# Patient Record
Sex: Female | Born: 1950 | Race: White | Hispanic: No | Smoking: Former smoker
Health system: Southern US, Community
[De-identification: ages and names within clinical notes are randomized; demographics above are authoritative.]

## PROBLEM LIST (undated history)

## (undated) DIAGNOSIS — M1990 Unspecified osteoarthritis, unspecified site: Secondary | ICD-10-CM

## (undated) DIAGNOSIS — I10 Essential (primary) hypertension: Secondary | ICD-10-CM

## (undated) DIAGNOSIS — Z889 Allergy status to unspecified drugs, medicaments and biological substances status: Secondary | ICD-10-CM

## (undated) DIAGNOSIS — F419 Anxiety disorder, unspecified: Secondary | ICD-10-CM

## (undated) DIAGNOSIS — E039 Hypothyroidism, unspecified: Secondary | ICD-10-CM

## (undated) DIAGNOSIS — G4700 Insomnia, unspecified: Secondary | ICD-10-CM

## (undated) DIAGNOSIS — I499 Cardiac arrhythmia, unspecified: Secondary | ICD-10-CM

## (undated) DIAGNOSIS — C801 Malignant (primary) neoplasm, unspecified: Secondary | ICD-10-CM

## (undated) DIAGNOSIS — E041 Nontoxic single thyroid nodule: Secondary | ICD-10-CM

## (undated) DIAGNOSIS — R002 Palpitations: Secondary | ICD-10-CM

## (undated) DIAGNOSIS — M8580 Other specified disorders of bone density and structure, unspecified site: Secondary | ICD-10-CM

## (undated) DIAGNOSIS — E785 Hyperlipidemia, unspecified: Secondary | ICD-10-CM

## (undated) DIAGNOSIS — J189 Pneumonia, unspecified organism: Secondary | ICD-10-CM

## (undated) DIAGNOSIS — F988 Other specified behavioral and emotional disorders with onset usually occurring in childhood and adolescence: Secondary | ICD-10-CM

## (undated) HISTORY — PX: TONSILLECTOMY: SUR1361

## (undated) HISTORY — PX: AUGMENTATION MAMMAPLASTY: SUR837

## (undated) HISTORY — DX: Hyperlipidemia, unspecified: E78.5

## (undated) HISTORY — DX: Unspecified osteoarthritis, unspecified site: M19.90

## (undated) HISTORY — PX: TUBAL LIGATION: SHX77

## (undated) HISTORY — PX: HIP SURGERY: SHX245

## (undated) HISTORY — PX: JOINT REPLACEMENT: SHX530

## (undated) HISTORY — PX: COLONOSCOPY: SHX174

## (undated) HISTORY — PX: ABDOMINAL HYSTERECTOMY: SHX81

---

## 1998-01-21 ENCOUNTER — Other Ambulatory Visit: Admission: RE | Admit: 1998-01-21 | Discharge: 1998-01-21 | Payer: Self-pay | Admitting: Obstetrics & Gynecology

## 1999-03-30 ENCOUNTER — Other Ambulatory Visit: Admission: RE | Admit: 1999-03-30 | Discharge: 1999-03-30 | Payer: Self-pay | Admitting: Obstetrics & Gynecology

## 2001-08-30 ENCOUNTER — Other Ambulatory Visit: Admission: RE | Admit: 2001-08-30 | Discharge: 2001-08-30 | Payer: Self-pay | Admitting: Obstetrics & Gynecology

## 2002-12-17 ENCOUNTER — Other Ambulatory Visit: Admission: RE | Admit: 2002-12-17 | Discharge: 2002-12-17 | Payer: Self-pay | Admitting: Obstetrics & Gynecology

## 2002-12-19 ENCOUNTER — Encounter: Admission: RE | Admit: 2002-12-19 | Discharge: 2002-12-19 | Payer: Self-pay | Admitting: Obstetrics & Gynecology

## 2002-12-19 ENCOUNTER — Encounter: Payer: Self-pay | Admitting: Obstetrics & Gynecology

## 2002-12-19 ENCOUNTER — Ambulatory Visit (HOSPITAL_COMMUNITY): Admission: RE | Admit: 2002-12-19 | Discharge: 2002-12-19 | Payer: Self-pay | Admitting: Obstetrics & Gynecology

## 2003-05-15 ENCOUNTER — Encounter: Admission: RE | Admit: 2003-05-15 | Discharge: 2003-05-15 | Payer: Self-pay | Admitting: Obstetrics & Gynecology

## 2003-05-15 ENCOUNTER — Encounter: Payer: Self-pay | Admitting: Obstetrics & Gynecology

## 2003-08-12 ENCOUNTER — Encounter: Payer: Self-pay | Admitting: Obstetrics & Gynecology

## 2003-08-12 ENCOUNTER — Encounter: Admission: RE | Admit: 2003-08-12 | Discharge: 2003-08-12 | Payer: Self-pay | Admitting: Obstetrics & Gynecology

## 2004-03-31 ENCOUNTER — Other Ambulatory Visit: Admission: RE | Admit: 2004-03-31 | Discharge: 2004-03-31 | Payer: Self-pay | Admitting: Obstetrics & Gynecology

## 2004-07-08 ENCOUNTER — Ambulatory Visit (HOSPITAL_COMMUNITY): Admission: RE | Admit: 2004-07-08 | Discharge: 2004-07-08 | Payer: Self-pay | Admitting: Internal Medicine

## 2004-07-19 ENCOUNTER — Ambulatory Visit (HOSPITAL_COMMUNITY): Admission: RE | Admit: 2004-07-19 | Discharge: 2004-07-19 | Payer: Self-pay | Admitting: Neurosurgery

## 2005-02-22 ENCOUNTER — Other Ambulatory Visit: Admission: RE | Admit: 2005-02-22 | Discharge: 2005-02-22 | Payer: Self-pay | Admitting: Obstetrics & Gynecology

## 2005-02-27 ENCOUNTER — Encounter: Admission: RE | Admit: 2005-02-27 | Discharge: 2005-02-27 | Payer: Self-pay | Admitting: Obstetrics & Gynecology

## 2005-06-26 ENCOUNTER — Ambulatory Visit (HOSPITAL_COMMUNITY): Admission: RE | Admit: 2005-06-26 | Discharge: 2005-06-26 | Payer: Self-pay | Admitting: Neurosurgery

## 2005-08-21 ENCOUNTER — Other Ambulatory Visit: Admission: RE | Admit: 2005-08-21 | Discharge: 2005-08-21 | Payer: Self-pay | Admitting: Obstetrics & Gynecology

## 2005-12-19 ENCOUNTER — Encounter (INDEPENDENT_AMBULATORY_CARE_PROVIDER_SITE_OTHER): Payer: Self-pay | Admitting: *Deleted

## 2005-12-19 ENCOUNTER — Observation Stay (HOSPITAL_COMMUNITY): Admission: RE | Admit: 2005-12-19 | Discharge: 2005-12-19 | Payer: Self-pay | Admitting: Obstetrics & Gynecology

## 2006-04-04 ENCOUNTER — Encounter: Admission: RE | Admit: 2006-04-04 | Discharge: 2006-04-04 | Payer: Self-pay | Admitting: Obstetrics & Gynecology

## 2006-05-07 ENCOUNTER — Encounter: Admission: RE | Admit: 2006-05-07 | Discharge: 2006-05-07 | Payer: Self-pay | Admitting: Obstetrics & Gynecology

## 2007-06-20 ENCOUNTER — Encounter: Admission: RE | Admit: 2007-06-20 | Discharge: 2007-06-20 | Payer: Self-pay | Admitting: Obstetrics & Gynecology

## 2007-10-01 ENCOUNTER — Encounter: Admission: RE | Admit: 2007-10-01 | Discharge: 2007-10-01 | Payer: Self-pay | Admitting: Internal Medicine

## 2007-10-08 ENCOUNTER — Encounter: Admission: RE | Admit: 2007-10-08 | Discharge: 2007-10-08 | Payer: Self-pay | Admitting: Internal Medicine

## 2007-10-08 ENCOUNTER — Encounter (INDEPENDENT_AMBULATORY_CARE_PROVIDER_SITE_OTHER): Payer: Self-pay | Admitting: Interventional Radiology

## 2007-10-08 ENCOUNTER — Other Ambulatory Visit: Admission: RE | Admit: 2007-10-08 | Discharge: 2007-10-08 | Payer: Self-pay | Admitting: Interventional Radiology

## 2009-02-25 ENCOUNTER — Ambulatory Visit: Payer: Self-pay | Admitting: Internal Medicine

## 2009-03-29 ENCOUNTER — Ambulatory Visit: Payer: Self-pay | Admitting: Internal Medicine

## 2009-04-29 ENCOUNTER — Ambulatory Visit: Payer: Self-pay | Admitting: Internal Medicine

## 2009-05-27 ENCOUNTER — Ambulatory Visit: Payer: Self-pay | Admitting: Internal Medicine

## 2009-06-22 ENCOUNTER — Encounter: Admission: RE | Admit: 2009-06-22 | Discharge: 2009-06-22 | Payer: Self-pay | Admitting: Obstetrics & Gynecology

## 2009-06-22 ENCOUNTER — Ambulatory Visit: Payer: Self-pay | Admitting: Internal Medicine

## 2009-09-24 ENCOUNTER — Ambulatory Visit: Payer: Self-pay | Admitting: Internal Medicine

## 2010-11-03 ENCOUNTER — Ambulatory Visit: Payer: Self-pay | Admitting: Internal Medicine

## 2010-12-18 ENCOUNTER — Encounter: Payer: Self-pay | Admitting: Obstetrics & Gynecology

## 2010-12-18 ENCOUNTER — Encounter: Payer: Self-pay | Admitting: Neurosurgery

## 2011-03-29 ENCOUNTER — Other Ambulatory Visit: Payer: Self-pay | Admitting: Obstetrics & Gynecology

## 2011-03-29 DIAGNOSIS — Z1231 Encounter for screening mammogram for malignant neoplasm of breast: Secondary | ICD-10-CM

## 2011-03-31 ENCOUNTER — Ambulatory Visit
Admission: RE | Admit: 2011-03-31 | Discharge: 2011-03-31 | Disposition: A | Discharge status: Home / Self Care | Payer: BC Managed Care – PPO | Source: Ambulatory Visit | Attending: Obstetrics & Gynecology | Admitting: Obstetrics & Gynecology

## 2011-03-31 DIAGNOSIS — Z1231 Encounter for screening mammogram for malignant neoplasm of breast: Secondary | ICD-10-CM

## 2011-04-14 NOTE — H&P (Signed)
Jeanne Smith, Jeanne Smith               ACCOUNT NO.:  [id number]   MEDICAL RECORD NO.:  [id number]          PATIENT TYPE:  AMB   LOCATION:  SDC                           FACILITY:  [facility]   PHYSICIAN:  Freddy Finner, M.D.   DATE OF BIRTH:  Apr 04, 1951   DATE OF ADMISSION:  12/19/2005  DATE OF DISCHARGE:                                HISTORY & PHYSICAL   ADMISSION DIAGNOSES:  1.  Uterine fibroid.  2.  Endometrial polyp.  3.  Postmenopausal bleeding.   HISTORY OF PRESENT ILLNESS:  The patient is a 60 year old white married  female, gravida 4, para 3, who has had a long history of irregular bleeding  and perimenopausal bleeding since at least March of 2003.  She has been  known to have a urine fibroid.  She is currently on hormone replacement  therapy and recently had an episode of postmenopausal bleeding and on  sonohysterogram here in the office was found to have a large endometrial  polyp.  Fibroid was further confirmed.  She has bilateral cystic adnexal  masses.  On several occasions in the past, she has been scheduled for  surgery, but rescheduled.  She is now prepared to proceed with definitive  surgery, specifically laparoscopically-assisted vaginal hysterectomy,  bilateral salpingo-oophorectomy.   REVIEW OF SYSTEMS:  Otherwise negative.  She has no other known significant  medical illnesses.   MEDICATIONS:  1.  Calcium supplement.  2.  Vitamins.  3.  Ibuprofen on a p.r.n. basis.  4.  Femhrt which she takes daily.  5.  Xanax which she takes on p.r.n. basis.   She is on no other medications and has no other known medical illnesses.   PAST SURGICAL HISTORY:  D&C for lost IUD with pregnancy and laparoscopic  sterilization done in 1985.  She had a right breast biopsy in 1995 which was  benign.  She has had no other surgical procedures.  She has never had a  blood transfusion.  She has had a breast augmentation.   She only occasionally uses cigarettes, occasionally uses  alcohol.   FAMILY HISTORY:  Noncontributory.   PHYSICAL EXAMINATION:  HEENT:  Grossly within normal limits.  Thyroid gland  is not palpably enlarged.  VITAL SIGNS:  Blood pressure in the office on recent check was 140/88.  CHEST:  Clear to auscultation.  HEART:  Normal sinus rhythm without murmurs, rubs, or gallops.  BREASTS:  Considered to be normal, no palpable masses, no skin change, no  nipple discharge.  ABDOMEN:  Soft and nontender without appreciable organomegaly or palpable  masses.  EXTREMITIES:  Without cyanosis, clubbing, or edema.  PELVIC:  External genitalia, vagina, and cervix are normal to inspection.  Bimanual reveals the uterus to be retroverted and slightly increased in  overall size.  There are no palpable masses on clinical examination, but  pelvic ultrasound has confirmed otherwise.  RECTAL:  The rectum is palpably normal.  Rectovaginal examination confirms  the above findings.   ASSESSMENT:  1.  Uterine fibroid.  2.  Large endometrial polyp.  3.  Bilateral cystic adnexal masses.  PLAN:  Laparoscopically-assisted vaginal hysterectomy, bilateral salpingo-  oophorectomy.      Freddy Finner, M.D.  Electronically Signed     WRN/MEDQ  D:  12/18/2005  T:  12/18/2005  Job:  [id number]

## 2011-04-14 NOTE — Op Note (Signed)
Jeanne Smith, Jeanne Smith               ACCOUNT NO.:  [id number]   MEDICAL RECORD NO.:  [id number]          PATIENT TYPE:  OBV   LOCATION:  9313                          FACILITY:  [facility]   PHYSICIAN:  Freddy Finner, M.D.   DATE OF BIRTH:  [birth year]   DATE OF PROCEDURE:  12/19/2005  DATE OF DISCHARGE:                                 OPERATIVE REPORT   PREOPERATIVE DIAGNOSES:  1.  Uterine fibroid.  2.  Endometrial polyp.  3.  Bilateral cystic adnexal masses.   POSTOPERATIVE DIAGNOSES:  1.  No evidence of right cystic mass.  2.  Left paravertebral cyst.  3.  Uterine fibroids.  4.  Endometrial polyp.  5.  Endocervical polyp.   OPERATIVE PROCEDURE:  Laparoscopic assisted vaginal hysterectomy and  bilateral salpingo-oophorectomy.   SURGEON:  Dr. Jennette Kettle.   ASSISTANT:  Dr. Renaldo Fiddler.   ESTIMATED INTRAOPERATIVE BLOOD LOSS:  Less than 100 cc.   COMPLICATIONS:  None.   HISTORY OF PRESENT ILLNESS:  The patient is a 60 year old who is now  admitted for LAVH.  On the morning of surgery, she was given an antibiotic  preoperatively.  She was placed in antiembolic hose.  She was brought to the  operating room and placed under adequate general anesthesia.  Placed in the  dorsal lithotomy position using Allen stirrup system.  Betadine prep of the  abdomen, peroneum and vagina was carried out in the usual fashion with  Betadine scrub followed by Betadine solution.  Bladder was evacuated with  the [facility] catheter.  Tenaculum was attached to the cervix under direct  visualization.  Sterile drapes were applied.  Two small incisions were made  in the abdomen, one at the umbilicus, one just above the symphysis.  An 11  mm, nonbloody disposable Trocar was introduced at the umbilicus for  elevating the anterior abdominal wall manually.  Direct inspection revealed  adequate placement with no evidence of injury on entry.  Pneumoperitoneum  was allowed to accumulate with carbon dioxide gas.  A second 5 mm  Trocar was  placed at the lower incision under direct visualization.  Scanning  inspection of the upper abdomen revealed select blunting of the liver edge  with no gross abnormality.  There were some omental adhesions in the right  lower quadrant.  The appendix was visualized and it was normal.  The omental  adhesions were not treated.  Pelvic findings were reviewed with an  enlargement.  No apparent ovarian cyst was noted on the right, which had  been seen previously by ultrasound.  There was a paratubal cyst on the left  side.  There were no other pelvic abnormalities noted.  Using the Gyrus  tripolar device and __________ grasping forceps to the lower incision, the  infundibular and pelvic ligaments, round ligaments and upper broad ligaments  on each side were progressively developed.  Sealed with bipolar coagulation  sharply divided.  This was carried down to the level just above the uterine  arteries.  Attention was then turned vaginally.  Posterior weighted vaginal  retractor was placed.  Deaver's were used to retract  the vaginal anteriorly  and laterally.  __________  tenaculum was removed and Jacobs tenaculum  applied.  Posterior colpotomy incision was made by tending to the mucosa  with an Allis and incising sharply with Mayo scissors.  Cervix was  circumscribed.  It was scalped to release the mucosa.  The Gyrus Haney-style  clamp was then used to seal and divide the uterosacral ligaments and to seal  and divide the bladder covers.  Bladder was further advanced off the cervix  and anterior peritoneum entered.  Carbon malignant pedicles were then tucked  and sealed in with the Gyrus devise, as were the vessel pedicles.  Additional vessels on each side was sealed and divided.  The uterus was then  delivered through the vaginal introitus.  Filmy peritoneal adhesions were  then sealed and divided which completely released the uterus.  Angles of the  vagina are anchored to the uterus  with cycles of mattress sutures of  Monocryl.  Posterior peritoneum was closed and uterosacral was blockaded  with __________ 0-Monocryl.  Cuff was closed vertically with figure-of-8 0-  Monocryl.  Foley catheter was placed.  Reinspection laparoscopically  revealed approximately three small oozing sources:  One on the right  uterosacral, one on the bladder flap and one just medial to the left  uterosacral, all of which were easily controlled with bipolar coagulation.  The new shot irrigation system was used during this portion of the  procedure, and hemostasis was complete under irrigation and under reduced  intraabdominal pressure.  Irrigating solution was aspirated from the  abdomen.  Incisions were anesthetized with 0.5% plain Marcaine.  Incisions  were closed with interrupted subcuticular sutures of 3-0 Dexon.  Steri-  Strips were applied to the lower incision.  The patient was awakened, taken  to the recovery room in good condition.      Freddy Finner, M.D.  Electronically Signed     WRN/MEDQ  D:  12/19/2005  T:  12/19/2005  Job:  [id number]

## 2011-04-14 NOTE — Discharge Summary (Signed)
Jeanne Smith, Jeanne Smith               ACCOUNT NO.:  [id number]   MEDICAL RECORD NO.:  [id number]          PATIENT TYPE:  OBV   LOCATION:  9313                          FACILITY:  [facility]   PHYSICIAN:  Freddy Finner, M.D.   DATE OF BIRTH:  08/01/1951   DATE OF ADMISSION:  12/19/2005  DATE OF DISCHARGE:  12/19/2005                                 DISCHARGE SUMMARY   DISCHARGE DIAGNOSES:  1.  Uterine leiomyoma.  2.  Benign endometrial polyp.  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   OPERATIVE PROCEDURE:  Laparoscopically-assisted vaginal hysterectomy,  bilateral salpingo-oophorectomy.   INTRAOPERATIVE AND POSTOPERATIVE COMPLICATIONS:  None   DISPOSITION:  The patient was in satisfactory postoperative condition.  Approximately 12 hours after surgery, she was ambulating without difficulty.  She was having adequate bowel and bladder function. She was tolerating a  regular diet.  At her request, she was discharged home for follow-up in the  office in approximately 2 weeks. She was cautioned about fever, heavy  vaginal bleeding.  She was encouraged to do no heavy lifting or have vaginal  entry.   DISCHARGE MEDICATIONS:  1.  She was given Premarin 0.625 milligrams to be taken one a day.  2.  She was given Percocet to be taken 1-2 q.4h. as needed for postoperative      pain.  3.  She is to use ibuprofen as needed.  4.  She is to take a vitamin supplement.   Details of the present illness, past history, family history, review of  systems and physical exam recorded on admission note.   PHYSICAL EXAMINATION:  Physical findings were remarkable for the enlargement  of the uterus with uterine myoma.   Preoperative outpatient ultrasound findings included endometrial polyp  diagnosed the time of episode of postmenopausal bleeding. Also, she had, at  that time, bilateral cystic adnexal masses.   LABORATORY DATA:  Laboratory data during this admission includes a  preoperative hemoglobin of 60,  postoperative hemoglobin of 14.1.  Preoperative prothrombin time and PTT were normal. Urinalysis on admission  was normal.   HOSPITAL COURSE:  The patient was admitted on the morning of surgery.  She  was treated perioperatively with PAS anti-embolic hose and with IV  antibiotic.  The above-described operative procedure was accomplished  without any intraoperative complications. The patient was very motivated for  early discharge.  I had her catheter removed in  mid afternoon, and by the time of her discharge was voiding with minimal  residual as determined by ultrasound. She had remained afebrile. Her vital  signs were stable. She was alert, oriented. She was discharged home with  disposition as noted above.      Freddy Finner, M.D.  Electronically Signed     WRN/MEDQ  D:  01/02/2006  T:  01/02/2006  Job:  [id number]

## 2011-07-24 ENCOUNTER — Other Ambulatory Visit: Payer: Self-pay | Admitting: Internal Medicine

## 2011-07-24 NOTE — Telephone Encounter (Signed)
Needs ov. Give # 30 and book visit

## 2012-06-03 ENCOUNTER — Other Ambulatory Visit: Payer: Self-pay | Admitting: Obstetrics & Gynecology

## 2012-06-03 DIAGNOSIS — Z1231 Encounter for screening mammogram for malignant neoplasm of breast: Secondary | ICD-10-CM

## 2012-06-18 ENCOUNTER — Ambulatory Visit
Admission: RE | Admit: 2012-06-18 | Discharge: 2012-06-18 | Disposition: A | Discharge status: Home / Self Care | Payer: BC Managed Care – PPO | Source: Ambulatory Visit | Attending: Obstetrics & Gynecology | Admitting: Obstetrics & Gynecology

## 2012-06-18 DIAGNOSIS — Z1231 Encounter for screening mammogram for malignant neoplasm of breast: Secondary | ICD-10-CM

## 2014-03-13 ENCOUNTER — Other Ambulatory Visit: Payer: Self-pay

## 2014-03-13 DIAGNOSIS — Z1231 Encounter for screening mammogram for malignant neoplasm of breast: Secondary | ICD-10-CM

## 2014-03-18 ENCOUNTER — Ambulatory Visit
Admission: RE | Admit: 2014-03-18 | Discharge: 2014-03-18 | Disposition: A | Discharge status: Home / Self Care | Payer: BC Managed Care – PPO | Source: Ambulatory Visit

## 2014-03-18 DIAGNOSIS — Z1231 Encounter for screening mammogram for malignant neoplasm of breast: Secondary | ICD-10-CM

## 2014-04-07 ENCOUNTER — Encounter (HOSPITAL_BASED_OUTPATIENT_CLINIC_OR_DEPARTMENT_OTHER): Admission: RE | Payer: Self-pay | Source: Ambulatory Visit

## 2014-04-07 SURGERY — CARPAL TUNNEL RELEASE
Anesthesia: General | Laterality: Right

## 2014-04-10 ENCOUNTER — Ambulatory Visit (HOSPITAL_BASED_OUTPATIENT_CLINIC_OR_DEPARTMENT_OTHER)
Admission: RE | Admit: 2014-04-10 | Payer: BC Managed Care – PPO | Source: Ambulatory Visit | Admitting: Orthopedic Surgery

## 2015-06-14 ENCOUNTER — Encounter (HOSPITAL_COMMUNITY): Payer: Self-pay

## 2015-06-14 ENCOUNTER — Inpatient Hospital Stay (HOSPITAL_COMMUNITY)
Admission: EM | Admit: 2015-06-14 | Discharge: 2015-06-18 | DRG: 481 | Disposition: A | Discharge status: Home Health | Payer: BLUE CROSS/BLUE SHIELD | Attending: Internal Medicine | Admitting: Internal Medicine

## 2015-06-14 DIAGNOSIS — Z7982 Long term (current) use of aspirin: Secondary | ICD-10-CM

## 2015-06-14 DIAGNOSIS — S42211A Unspecified displaced fracture of surgical neck of right humerus, initial encounter for closed fracture: Secondary | ICD-10-CM | POA: Diagnosis present

## 2015-06-14 DIAGNOSIS — Y929 Unspecified place or not applicable: Secondary | ICD-10-CM

## 2015-06-14 DIAGNOSIS — Z79899 Other long term (current) drug therapy: Secondary | ICD-10-CM

## 2015-06-14 DIAGNOSIS — S42291A Other displaced fracture of upper end of right humerus, initial encounter for closed fracture: Secondary | ICD-10-CM

## 2015-06-14 DIAGNOSIS — W1839XA Other fall on same level, initial encounter: Secondary | ICD-10-CM | POA: Diagnosis present

## 2015-06-14 DIAGNOSIS — S72009A Fracture of unspecified part of neck of unspecified femur, initial encounter for closed fracture: Secondary | ICD-10-CM

## 2015-06-14 DIAGNOSIS — M25551 Pain in right hip: Secondary | ICD-10-CM | POA: Diagnosis not present

## 2015-06-14 DIAGNOSIS — Z419 Encounter for procedure for purposes other than remedying health state, unspecified: Secondary | ICD-10-CM

## 2015-06-14 DIAGNOSIS — S72141A Displaced intertrochanteric fracture of right femur, initial encounter for closed fracture: Principal | ICD-10-CM | POA: Diagnosis present

## 2015-06-14 DIAGNOSIS — W19XXXA Unspecified fall, initial encounter: Secondary | ICD-10-CM

## 2015-06-14 DIAGNOSIS — M62838 Other muscle spasm: Secondary | ICD-10-CM | POA: Diagnosis not present

## 2015-06-14 DIAGNOSIS — Y9389 Activity, other specified: Secondary | ICD-10-CM

## 2015-06-14 DIAGNOSIS — F909 Attention-deficit hyperactivity disorder, unspecified type: Secondary | ICD-10-CM | POA: Diagnosis present

## 2015-06-14 DIAGNOSIS — I10 Essential (primary) hypertension: Secondary | ICD-10-CM | POA: Diagnosis present

## 2015-06-14 DIAGNOSIS — S42351A Displaced comminuted fracture of shaft of humerus, right arm, initial encounter for closed fracture: Secondary | ICD-10-CM | POA: Diagnosis present

## 2015-06-14 DIAGNOSIS — Z87891 Personal history of nicotine dependence: Secondary | ICD-10-CM

## 2015-06-14 DIAGNOSIS — R52 Pain, unspecified: Secondary | ICD-10-CM

## 2015-06-14 HISTORY — DX: Allergy status to unspecified drugs, medicaments and biological substances: Z88.9

## 2015-06-14 HISTORY — DX: Unspecified osteoarthritis, unspecified site: M19.90

## 2015-06-14 HISTORY — DX: Other specified behavioral and emotional disorders with onset usually occurring in childhood and adolescence: F98.8

## 2015-06-14 HISTORY — DX: Essential (primary) hypertension: I10

## 2015-06-14 NOTE — ED Notes (Signed)
Pt was walking the dog and he drug her threw the yard, she has injured mostly the right leg and shoulder and she has random scrapes

## 2015-06-15 ENCOUNTER — Emergency Department (HOSPITAL_COMMUNITY): Payer: BLUE CROSS/BLUE SHIELD

## 2015-06-15 ENCOUNTER — Encounter (HOSPITAL_COMMUNITY): Payer: Self-pay | Admitting: General Practice

## 2015-06-15 ENCOUNTER — Inpatient Hospital Stay (HOSPITAL_COMMUNITY): Payer: BLUE CROSS/BLUE SHIELD

## 2015-06-15 DIAGNOSIS — M25551 Pain in right hip: Secondary | ICD-10-CM | POA: Diagnosis present

## 2015-06-15 DIAGNOSIS — I10 Essential (primary) hypertension: Secondary | ICD-10-CM

## 2015-06-15 DIAGNOSIS — S42211A Unspecified displaced fracture of surgical neck of right humerus, initial encounter for closed fracture: Secondary | ICD-10-CM | POA: Diagnosis present

## 2015-06-15 DIAGNOSIS — Z7982 Long term (current) use of aspirin: Secondary | ICD-10-CM | POA: Diagnosis not present

## 2015-06-15 DIAGNOSIS — M62838 Other muscle spasm: Secondary | ICD-10-CM | POA: Diagnosis not present

## 2015-06-15 DIAGNOSIS — S72141A Displaced intertrochanteric fracture of right femur, initial encounter for closed fracture: Secondary | ICD-10-CM | POA: Diagnosis present

## 2015-06-15 DIAGNOSIS — W1839XA Other fall on same level, initial encounter: Secondary | ICD-10-CM | POA: Diagnosis present

## 2015-06-15 DIAGNOSIS — Z87891 Personal history of nicotine dependence: Secondary | ICD-10-CM | POA: Diagnosis not present

## 2015-06-15 DIAGNOSIS — F909 Attention-deficit hyperactivity disorder, unspecified type: Secondary | ICD-10-CM | POA: Diagnosis present

## 2015-06-15 DIAGNOSIS — S42291A Other displaced fracture of upper end of right humerus, initial encounter for closed fracture: Secondary | ICD-10-CM | POA: Diagnosis present

## 2015-06-15 DIAGNOSIS — Z79899 Other long term (current) drug therapy: Secondary | ICD-10-CM | POA: Diagnosis not present

## 2015-06-15 DIAGNOSIS — S42351A Displaced comminuted fracture of shaft of humerus, right arm, initial encounter for closed fracture: Secondary | ICD-10-CM | POA: Diagnosis not present

## 2015-06-15 DIAGNOSIS — Y9389 Activity, other specified: Secondary | ICD-10-CM | POA: Diagnosis not present

## 2015-06-15 DIAGNOSIS — Y929 Unspecified place or not applicable: Secondary | ICD-10-CM | POA: Diagnosis not present

## 2015-06-15 LAB — TYPE AND SCREEN
ABO/RH(D): O NEG
Antibody Screen: NEGATIVE

## 2015-06-15 LAB — BASIC METABOLIC PANEL
ANION GAP: 9 (ref 5–15)
Anion gap: 8 (ref 5–15)
BUN: 10 mg/dL (ref 6–20)
BUN: 9 mg/dL (ref 6–20)
CALCIUM: 8.8 mg/dL — AB (ref 8.9–10.3)
CALCIUM: 8.8 mg/dL — AB (ref 8.9–10.3)
CHLORIDE: 106 mmol/L (ref 101–111)
CHLORIDE: 106 mmol/L (ref 101–111)
CO2: 23 mmol/L (ref 22–32)
CO2: 25 mmol/L (ref 22–32)
CREATININE: 0.45 mg/dL (ref 0.44–1.00)
Creatinine, Ser: 0.44 mg/dL (ref 0.44–1.00)
GFR calc Af Amer: >60 mL/min (ref >60)
GFR calc non Af Amer: >60 mL/min (ref >60)
Glucose, Bld: 121 mg/dL — ABNORMAL HIGH (ref 65–99)
Glucose, Bld: 127 mg/dL — ABNORMAL HIGH (ref 65–99)
POTASSIUM: 3.9 mmol/L (ref 3.5–5.1)
POTASSIUM: 3.8 mmol/L (ref 3.5–5.1)
SODIUM: 138 mmol/L (ref 135–145)
SODIUM: 139 mmol/L (ref 135–145)

## 2015-06-15 LAB — SURGICAL PCR SCREEN
MRSA, PCR: NEGATIVE
Staphylococcus aureus: NEGATIVE

## 2015-06-15 LAB — CBC WITH DIFFERENTIAL/PLATELET
BASOS ABS: 0 10*3/uL (ref 0.0–0.1)
Basophils Relative: 0 % (ref 0–1)
Eosinophils Absolute: 0 10*3/uL (ref 0.0–0.7)
Eosinophils Relative: 0 % (ref 0–5)
HCT: 39.8 % (ref 36.0–46.0)
Hemoglobin: 13.7 g/dL (ref 12.0–15.0)
Lymphocytes Relative: 10 % — ABNORMAL LOW (ref 12–46)
Lymphs Abs: 1.1 10*3/uL (ref 0.7–4.0)
MCH: 33.5 pg (ref 26.0–34.0)
MCHC: 34.4 g/dL (ref 30.0–36.0)
MCV: 97.3 fL (ref 78.0–100.0)
MONO ABS: 0.5 10*3/uL (ref 0.1–1.0)
MONOS PCT: 4 % (ref 3–12)
Neutro Abs: 10 10*3/uL — ABNORMAL HIGH (ref 1.7–7.7)
Neutrophils Relative %: 86 % — ABNORMAL HIGH (ref 43–77)
PLATELETS: 234 10*3/uL (ref 150–400)
RBC: 4.09 MIL/uL (ref 3.87–5.11)
RDW: 13.1 % (ref 11.5–15.5)
WBC: 11.6 10*3/uL — ABNORMAL HIGH (ref 4.0–10.5)

## 2015-06-15 LAB — URINALYSIS, ROUTINE W REFLEX MICROSCOPIC
Bilirubin Urine: NEGATIVE
GLUCOSE, UA: NEGATIVE mg/dL
Hgb urine dipstick: NEGATIVE
Ketones, ur: NEGATIVE mg/dL
Leukocytes, UA: NEGATIVE
Nitrite: NEGATIVE
Protein, ur: NEGATIVE mg/dL
SPECIFIC GRAVITY, URINE: 1.015 (ref 1.005–1.030)
Urobilinogen, UA: 0.2 mg/dL (ref 0.0–1.0)
pH: 5.5 (ref 5.0–8.0)

## 2015-06-15 LAB — CBC
HEMATOCRIT: 39.9 % (ref 36.0–46.0)
Hemoglobin: 13.6 g/dL (ref 12.0–15.0)
MCH: 32.9 pg (ref 26.0–34.0)
MCHC: 34.1 g/dL (ref 30.0–36.0)
MCV: 96.4 fL (ref 78.0–100.0)
Platelets: 196 10*3/uL (ref 150–400)
RBC: 4.14 MIL/uL (ref 3.87–5.11)
RDW: 13.2 % (ref 11.5–15.5)
WBC: 9.6 10*3/uL (ref 4.0–10.5)

## 2015-06-15 LAB — PROTIME-INR
INR: 0.98 (ref 0.00–1.49)
Prothrombin Time: 13.2 seconds (ref 11.6–15.2)

## 2015-06-15 LAB — ABO/RH: ABO/RH(D): O NEG

## 2015-06-15 MED ORDER — HYDROMORPHONE HCL 1 MG/ML IJ SOLN
1.0000 mg | Freq: Once | INTRAMUSCULAR | Status: AC
  Administered 2015-06-15: 1 mg via INTRAVENOUS
  Filled 2015-06-15: qty 1

## 2015-06-15 MED ORDER — HYDROMORPHONE HCL 1 MG/ML IJ SOLN
1.0000 mg | INTRAMUSCULAR | Status: DC | PRN
  Administered 2015-06-15 (×3): 1 mg via INTRAVENOUS
  Filled 2015-06-15 (×3): qty 1

## 2015-06-15 MED ORDER — ONDANSETRON HCL 4 MG/2ML IJ SOLN: 4.0000 mg | Freq: Three times a day (TID) | INTRAMUSCULAR | Status: AC | PRN

## 2015-06-15 MED ORDER — HYDROMORPHONE HCL 1 MG/ML IJ SOLN
1.0000 mg | INTRAMUSCULAR | Status: DC | PRN
  Administered 2015-06-15 – 2015-06-17 (×10): 1 mg via INTRAVENOUS
  Filled 2015-06-15 (×10): qty 1

## 2015-06-15 MED ORDER — HYDROMORPHONE HCL 1 MG/ML IJ SOLN
0.5000 mg | INTRAMUSCULAR | Status: DC | PRN
  Administered 2015-06-15: 0.5 mg via INTRAVENOUS
  Filled 2015-06-15: qty 1

## 2015-06-15 MED ORDER — OXYCODONE-ACETAMINOPHEN 5-325 MG PO TABS: 1.0000 | ORAL_TABLET | ORAL | Status: DC | PRN

## 2015-06-15 MED ORDER — ONDANSETRON HCL 4 MG/2ML IJ SOLN
4.0000 mg | Freq: Once | INTRAMUSCULAR | Status: AC
  Administered 2015-06-15: 4 mg via INTRAVENOUS
  Filled 2015-06-15: qty 2

## 2015-06-15 MED ORDER — BISACODYL 5 MG PO TBEC
5.0000 mg | DELAYED_RELEASE_TABLET | Freq: Every day | ORAL | Status: DC | PRN
  Administered 2015-06-18: 5 mg via ORAL
  Filled 2015-06-15: qty 1

## 2015-06-15 MED ORDER — METOPROLOL SUCCINATE ER 50 MG PO TB24
50.0000 mg | ORAL_TABLET | Freq: Every day | ORAL | Status: DC
  Administered 2015-06-15 – 2015-06-18 (×4): 50 mg via ORAL
  Filled 2015-06-15 (×3): qty 1

## 2015-06-15 MED ORDER — MORPHINE SULFATE 2 MG/ML IJ SOLN: 0.5000 mg | INTRAMUSCULAR | Status: DC | PRN

## 2015-06-15 MED ORDER — HYDROMORPHONE HCL 1 MG/ML IJ SOLN: 0.5000 mg | INTRAMUSCULAR | Status: DC | PRN

## 2015-06-15 MED ORDER — METHOCARBAMOL 1000 MG/10ML IJ SOLN
750.0000 mg | Freq: Four times a day (QID) | INTRAVENOUS | Status: DC | PRN
  Administered 2015-06-15 – 2015-06-17 (×4): 750 mg via INTRAVENOUS
  Filled 2015-06-15 (×9): qty 7.5

## 2015-06-15 MED ORDER — HYDROMORPHONE HCL 1 MG/ML IJ SOLN
0.5000 mg | Freq: Once | INTRAMUSCULAR | Status: AC
  Administered 2015-06-15: 0.5 mg via INTRAVENOUS
  Filled 2015-06-15: qty 1

## 2015-06-15 MED ORDER — HYDROCODONE-ACETAMINOPHEN 5-325 MG PO TABS
1.0000 | ORAL_TABLET | Freq: Four times a day (QID) | ORAL | Status: DC | PRN
  Administered 2015-06-15 – 2015-06-18 (×9): 2 via ORAL
  Filled 2015-06-15 (×9): qty 2

## 2015-06-15 MED ORDER — METHOCARBAMOL 1000 MG/10ML IJ SOLN
500.0000 mg | Freq: Four times a day (QID) | INTRAVENOUS | Status: DC | PRN
  Administered 2015-06-15: 500 mg via INTRAVENOUS
  Filled 2015-06-15 (×3): qty 5

## 2015-06-15 MED ORDER — ENOXAPARIN SODIUM 40 MG/0.4ML ~~LOC~~ SOLN
40.0000 mg | SUBCUTANEOUS | Status: DC
  Administered 2015-06-15 – 2015-06-18 (×4): 40 mg via SUBCUTANEOUS
  Filled 2015-06-15 (×4): qty 0.4

## 2015-06-15 MED ORDER — MORPHINE SULFATE 4 MG/ML IJ SOLN
4.0000 mg | Freq: Once | INTRAMUSCULAR | Status: AC
  Administered 2015-06-15: 4 mg via INTRAVENOUS
  Filled 2015-06-15: qty 1

## 2015-06-15 MED ORDER — LISDEXAMFETAMINE DIMESYLATE 30 MG PO CAPS
50.0000 mg | ORAL_CAPSULE | Freq: Every day | ORAL | Status: DC
  Filled 2015-06-15: qty 1

## 2015-06-15 NOTE — Progress Notes (Signed)
PT Cancellation Note  Patient Details Name: Jeanne Smith MRN: [id number] DOB: 04/05/1951   Cancelled Treatment:    Reason Eval/Treat Not Completed: Other (comment) (Active bed rest orders, will see when appropriate).  PT will continue to follow acutely.  Thank you for this order.  Joslyn Hy PT, DPT [phone] Pager: [phone] 06/15/2015, 8:47 AM

## 2015-06-15 NOTE — Progress Notes (Signed)
PROGRESS NOTE    BLAIZE NIPPER TIW:[id number] DOB: [birth year] DOA: 06/14/2015 PCP: Elby Showers, MD  HPI/Brief narrative 64 year old female with history of HTN, independent and physically quite active (walks 4 miles 3 times per week), got entangled with her dog leash and fell on hard surface sustaining a right shoulder and right humeral fracture. Orthopedics/Dr. Percell Miller has been consulted-input pending. Patient nothing by mouth for potential surgery.   Assessment/Plan:  Nondisplaced right intertrochanteric femur fracture Comminuted fracture of the right humeral head and nondisplaced fracture of right humeral neck - Orthopedics/Dr. Percell Miller consulted-input pending - NPO for possible surgical fixation - Based on available data, patient is at low risk for perioperative CV events and may proceed with surgery without any further cardiac workup with close perioperative monitoring. - Postop weight-bearing, pain management, DVT prophylaxis and wound care per orthopedic service - Robaxin IV added for muscle spasms. Pain management  Essential hypertension - Controlled on metoprolol  ADHD - On Vyvanse   DVT prophylaxis: Subcutaneous Lovenox Code Status: Full Family Communication: None at bedside Disposition Plan: To be determined.? SNF versus home when stable   Consultants:  Orthopedics  Procedures:  None  Antibiotics:  None  Subjective: Right shoulder and hip pain, worse with movements. No chest pain, dyspnea, palpitations.  Objective: Filed Vitals:   06/14/15 2313 06/15/15 0422 06/15/15 0533  BP: 124/71 124/71 152/88  Pulse: 72 72 75  Temp: 97.8 F (36.6 C) 97.8 F (36.6 C) 97.8 F (36.6 C)  TempSrc: Oral    Resp: 18 18 18   Weight: 58.968 kg (130 lb)    SpO2: 96% 96% 94%    Intake/Output Summary (Last 24 hours) at 06/15/15 1321 Last data filed at 06/15/15 0741  Gross per 24 hour  Intake      0 ml  Output    250 ml  Net   -250 ml   Filed Weights   06/14/15 2313  Weight: 58.968 kg (130 lb)     Exam:  General exam: Moderately built and nourished pleasant middle-aged female lying comfortably supine in bed. Respiratory system: Clear. No increased work of breathing. Cardiovascular system: S1 & S2 heard, RRR. No JVD, murmurs, gallops, clicks or pedal edema. Gastrointestinal system: Abdomen is nondistended, soft and nontender. Normal bowel sounds heard. Central nervous system: Alert and oriented. No focal neurological deficits. Extremities: Right upper extremity and lower extremity power not fully evaluated secondary to fractures and related pain. Left limbs grade 5 x 5 power. Right shoulder with mild to moderate swelling and local tenderness, minimal warmth. Right leg mildly shortened and externally rotated. Peripheral pulses symmetrically felt.   Data Reviewed: Basic Metabolic Panel:  Recent Labs Lab 06/15/15 0355 06/15/15 0616  NA 138 139  K 3.9 3.8  CL 106 106  CO2 23 25  GLUCOSE 121* 127*  BUN 10 9  CREATININE 0.44 0.45  CALCIUM 8.8* 8.8*   Liver Function Tests: No results for input(s): AST, ALT, ALKPHOS, BILITOT, PROT, ALBUMIN in the last 168 hours. No results for input(s): LIPASE, AMYLASE in the last 168 hours. No results for input(s): AMMONIA in the last 168 hours. CBC:  Recent Labs Lab 06/15/15 0355 06/15/15 0616  WBC 11.6* 9.6  NEUTROABS 10.0*  --   HGB 13.7 13.6  HCT 39.8 39.9  MCV 97.3 96.4  PLT 234 196   Cardiac Enzymes: No results for input(s): CKTOTAL, CKMB, CKMBINDEX, TROPONINI in the last 168 hours. BNP (last 3 results) No results for input(s): PROBNP in the last  8760 hours. CBG: No results for input(s): GLUCAP in the last 168 hours.  Recent Results (from the past 240 hour(s))  Surgical pcr screen     Status: None   Collection Time: 06/15/15  5:29 AM  Result Value Ref Range Status   MRSA, PCR NEGATIVE NEGATIVE Final   Staphylococcus aureus NEGATIVE NEGATIVE Final    Comment:        The  Xpert SA Assay (FDA approved for NASAL specimens in patients over 17 years of age), is one component of a comprehensive surveillance program.  Test performance has been validated by [facility] for patients greater than or equal to 18 year old. It is not intended to diagnose infection nor to guide or monitor treatment.      EKG: 06/15/15: Sinus rhythm with PACs.? RSR pattern/? Intraventricular conduction abnormality in V1-V2 and no acute changes.    Studies: Dg Shoulder Right  06/15/2015   CLINICAL DATA:  64 year old female with fall and right shoulder pain.  EXAM: RIGHT SHOULDER - 2+ VIEW  COMPARISON:  None.  FINDINGS: There is comminuted appearing fracture of the right humeral head with involvement of the greater tuberosity. There is possible extension of the fracture to the humeral neck. A small displaced fracture fragment noted lateral to the humeral head. There is anatomic alignment of the humeral head with the glenoid fossa.  IMPRESSION: Comminuted fracture of the right humeral head with possible extension to the neck of the humerus.   Electronically Signed   By: Anner Crete M.D.   On: 06/15/2015 01:36   Ct Pelvis Wo Contrast  06/15/2015   CLINICAL DATA:  [place] around 2230 hours yesterday while walking dog, pain increases with weight-bearing.  EXAM: CT PELVIS WITHOUT CONTRAST  TECHNIQUE: Multidetector CT imaging of the pelvis was performed following the standard protocol without intravenous contrast.  COMPARISON:  RIGHT hip radiograph June 15, 2015 at 0041 hours  FINDINGS: Faint linear RIGHT intertrochanteric lucency concerning for nondisplaced fracture (axial 62 - 69 of 85). Femoral heads are well formed, moderate to severe LEFT, moderate RIGHT hip joint space narrowing, periarticular sclerosis and femoral head spurring consistent with osteoarthrosis. No dislocation.  Moderate calcific atherosclerosis of the imaged aortoiliac vessels. Status post hysterectomy. Urinary bladder is well  distended unremarkable. Phleboliths in the pelvis. RIGHT hip subcutaneous fat stranding without subcutaneous gas or radiopaque foreign bodies. Osteopenia without destructive bony lesions.  IMPRESSION: Suspected nondisplaced RIGHT femur intertrochanteric fracture, though MRI would be more sensitive. No dislocation.  RIGHT hip soft tissue swelling/contusion.  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   Electronically Signed   By: Elon Alas M.D.   On: 06/15/2015 03:03   Ct Shoulder Right Wo Contrast  06/15/2015   CLINICAL DATA:  64 year old female with trauma to the right shoulder.  EXAM: CT OF THE RIGHT SHOULDER WITHOUT CONTRAST  TECHNIQUE: Multidetector CT imaging was performed according to the standard protocol. Multiplanar CT image reconstructions were also generated.  COMPARISON:  Radiograph dated 05/1915  FINDINGS: There is comminuted fracture of the right humeral head with fracture of the greater tuberosity. There is nondisplaced fracture of the neck of the right humerus. There is a small displaced cortical fracture fragment from the posterior humeral head. The humeral head remains in anatomic articulation with the glenoid fossa. There is soft tissue swelling with small intermuscular hematoma.  IMPRESSION: Comminuted fracture of the right humeral hand mid nondisplaced fracture of neck of the humerus. No dislocation.   Electronically Signed  By: Anner Crete M.D.   On: 06/15/2015 03:00   Dg Chest Port 1 View  06/15/2015   CLINICAL DATA:  Preoperative chest radiograph for right hip fracture.  EXAM: PORTABLE CHEST - 1 VIEW  COMPARISON:  None.  FINDINGS: The lungs are well-aerated and clear. There is no evidence of focal opacification, pleural effusion or pneumothorax.  The cardiomediastinal silhouette is within normal limits. No acute osseous abnormalities are seen. Bilateral breast implants are noted.  IMPRESSION: No acute cardiopulmonary process seen.   Electronically Signed    By: Garald Balding M.D.   On: 06/15/2015 05:55   Dg Hip Unilat With Pelvis 2-3 Views Right  06/15/2015   CLINICAL DATA:  [place] onto right side while walking dog, with right anterior hip pain. Initial encounter.  EXAM: DG HIP (WITH OR WITHOUT PELVIS) 2-3V RIGHT  COMPARISON:  None.  FINDINGS: There is no evidence of fracture or dislocation. Both femoral heads are seated normally within their respective acetabula. Minimal joint space narrowing is noted at the left hip. The proximal right femur appears intact. No significant degenerative change is appreciated. The sacroiliac joints are unremarkable in appearance.  The visualized bowel gas pattern is grossly unremarkable in appearance.  IMPRESSION: No evidence of fracture or dislocation.   Electronically Signed   By: Garald Balding M.D.   On: 06/15/2015 01:37        Scheduled Meds: . enoxaparin (LOVENOX) injection  40 mg Subcutaneous Q24H  . lisdexamfetamine (VYVANSE) capsule 50 mg  50 mg Oral Daily  . metoprolol succinate  50 mg Oral Daily   Continuous Infusions:   Principal Problem:   Intertrochanteric fracture of right femur Active Problems:   Comminuted right humeral fracture   HTN (hypertension)    Time spent: 71 minutes    Marco Raper, MD, FACP, FHM. [facility] Pager [phone]  If 7PM-7AM, please contact night-coverage [email or web address] Password [email or web address] 06/15/2015, 1:21 PM    LOS: 0 days

## 2015-06-15 NOTE — Consult Note (Signed)
ORTHOPAEDIC CONSULTATION  REQUESTING PHYSICIAN: Modena Jansky, MD  Chief Complaint: fall, right shoulder pain, Right hip pain  HPI: Jeanne Smith is a 64 y.o. female who was walking her daughters dog when he ran and pulled on the leash around her wrist. She fell onto her Right side. She c/o R shoulder and groin pain. No other c/o. She denies N/T.   Past Medical History  Diagnosis Date  . Hypertension   . Attention deficit disorder    History reviewed. No pertinent past surgical history. History   Social History  . Marital Status: Legally Separated    Spouse Name: N/A  . Number of Children: N/A  . Years of Education: N/A   Social History Main Topics  . Smoking status: Never Smoker   . Smokeless tobacco: Not on file  . Alcohol Use: Yes  . Drug Use: Not on file  . Sexual Activity: Not on file   Other Topics Concern  . None   Social History Narrative  . None   History reviewed. No pertinent family history. No Known Allergies Prior to Admission medications   Medication Sig Start Date End Date Taking? Authorizing Provider  b complex vitamins tablet Take 1 tablet by mouth daily.   Yes Historical Provider, MD  lisdexamfetamine (VYVANSE) 50 MG capsule Take 50 mg by mouth daily.   Yes Historical Provider, MD  MAGNESIUM PO Take 1 tablet by mouth daily.   Yes Historical Provider, MD  metoprolol (LOPRESSOR) 50 MG tablet Take 50 mg by mouth daily.   Yes Historical Provider, MD  Multiple Vitamin (MULTIVITAMIN WITH MINERALS) TABS tablet Take 1 tablet by mouth daily.   Yes Historical Provider, MD  metoprolol (TOPROL-XL) 50 MG 24 hr tablet TAKE 1 TABLET EVERY DAY Patient not taking: Reported on 06/14/2015 07/24/11   Elby Showers, MD   Dg Shoulder Right  06/15/2015   CLINICAL DATA:  64 year old female with fall and right shoulder pain.  EXAM: RIGHT SHOULDER - 2+ VIEW  COMPARISON:  None.  FINDINGS: There is comminuted appearing fracture of the right humeral head with  involvement of the greater tuberosity. There is possible extension of the fracture to the humeral neck. A small displaced fracture fragment noted lateral to the humeral head. There is anatomic alignment of the humeral head with the glenoid fossa.  IMPRESSION: Comminuted fracture of the right humeral head with possible extension to the neck of the humerus.   Electronically Signed   By: Anner Crete M.D.   On: 06/15/2015 01:36   Ct Pelvis Wo Contrast  06/15/2015   CLINICAL DATA:  [place] around 2230 hours yesterday while walking dog, pain increases with weight-bearing.  EXAM: CT PELVIS WITHOUT CONTRAST  TECHNIQUE: Multidetector CT imaging of the pelvis was performed following the standard protocol without intravenous contrast.  COMPARISON:  RIGHT hip radiograph June 15, 2015 at 0041 hours  FINDINGS: Faint linear RIGHT intertrochanteric lucency concerning for nondisplaced fracture (axial 62 - 69 of 85). Femoral heads are well formed, moderate to severe LEFT, moderate RIGHT hip joint space narrowing, periarticular sclerosis and femoral head spurring consistent with osteoarthrosis. No dislocation.  Moderate calcific atherosclerosis of the imaged aortoiliac vessels. Status post hysterectomy. Urinary bladder is well distended unremarkable. Phleboliths in the pelvis. RIGHT hip subcutaneous fat stranding without subcutaneous gas or radiopaque foreign bodies. Osteopenia without destructive bony lesions.  IMPRESSION: Suspected nondisplaced RIGHT femur intertrochanteric fracture, though MRI would be more sensitive. No dislocation.  RIGHT hip soft tissue  swelling/contusion.  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   Electronically Signed   By: Elon Alas M.D.   On: 06/15/2015 03:03   Ct Shoulder Right Wo Contrast  06/15/2015   CLINICAL DATA:  64 year old female with trauma to the right shoulder.  EXAM: CT OF THE RIGHT SHOULDER WITHOUT CONTRAST  TECHNIQUE: Multidetector CT imaging was performed  according to the standard protocol. Multiplanar CT image reconstructions were also generated.  COMPARISON:  Radiograph dated 05/1915  FINDINGS: There is comminuted fracture of the right humeral head with fracture of the greater tuberosity. There is nondisplaced fracture of the neck of the right humerus. There is a small displaced cortical fracture fragment from the posterior humeral head. The humeral head remains in anatomic articulation with the glenoid fossa. There is soft tissue swelling with small intermuscular hematoma.  IMPRESSION: Comminuted fracture of the right humeral hand mid nondisplaced fracture of neck of the humerus. No dislocation.   Electronically Signed   By: Anner Crete M.D.   On: 06/15/2015 03:00   Dg Chest Port 1 View  06/15/2015   CLINICAL DATA:  Preoperative chest radiograph for right hip fracture.  EXAM: PORTABLE CHEST - 1 VIEW  COMPARISON:  None.  FINDINGS: The lungs are well-aerated and clear. There is no evidence of focal opacification, pleural effusion or pneumothorax.  The cardiomediastinal silhouette is within normal limits. No acute osseous abnormalities are seen. Bilateral breast implants are noted.  IMPRESSION: No acute cardiopulmonary process seen.   Electronically Signed   By: Garald Balding M.D.   On: 06/15/2015 05:55   Dg Hip Unilat With Pelvis 2-3 Views Right  06/15/2015   CLINICAL DATA:  [place] onto right side while walking dog, with right anterior hip pain. Initial encounter.  EXAM: DG HIP (WITH OR WITHOUT PELVIS) 2-3V RIGHT  COMPARISON:  None.  FINDINGS: There is no evidence of fracture or dislocation. Both femoral heads are seated normally within their respective acetabula. Minimal joint space narrowing is noted at the left hip. The proximal right femur appears intact. No significant degenerative change is appreciated. The sacroiliac joints are unremarkable in appearance.  The visualized bowel gas pattern is grossly unremarkable in appearance.  IMPRESSION: No  evidence of fracture or dislocation.   Electronically Signed   By: Garald Balding M.D.   On: 06/15/2015 01:37    Positive ROS: All other systems have been reviewed and were otherwise negative with the exception of those mentioned in the HPI and as above.  Labs cbc  Recent Labs  06/15/15 0355 06/15/15 0616  WBC 11.6* 9.6  HGB 13.7 13.6  HCT 39.8 39.9  PLT 234 196    Labs inflam No results for input(s): CRP in the last 72 hours.  Invalid input(s): ESR  Labs coag  Recent Labs  06/15/15 0355  INR 0.98     Recent Labs  06/15/15 0355 06/15/15 0616  NA 138 139  K 3.9 3.8  CL 106 106  CO2 23 25  GLUCOSE 121* 127*  BUN 10 9  CREATININE 0.44 0.45  CALCIUM 8.8* 8.8*    Physical Exam: Filed Vitals:   06/15/15 0533  BP: 152/88  Pulse: 75  Temp: 97.8 F (36.6 C)  Resp: 18   General: Alert, no acute distress Cardiovascular: No pedal edema Respiratory: No cyanosis, no use of accessory musculature GI: No organomegaly, abdomen is soft and non-tender Skin: No lesions in the area of chief complaint other than those listed below in MSK exam.  Neurologic: Sensation intact distally Psychiatric: Patient is competent for consent with normal mood and affect Lymphatic: No axillary or cervical lymphadenopathy  MUSCULOSKELETAL:  RUE: pain with shoulder ROM, some swelling. Distally SILT M/R/U, +EPL/FPL/IO, 2+ pulses, compartments are soft.  RLE: pain with hip ROM, Compartments soft, skin benign, Distally NVI.   Other extremities are atraumatic with painless ROM and NVI.  Assessment: R proximal humerus fracture R intertrochanteric fracture  Plan: R Hip: IM Nail on Wednesday 7/20. R proximal Humerus: Non-operative treatment, Sling when OOB, NWB RUE  She will be WBAT on the RLE post op  Dispo: she strongly desires to go home post op, and has good family support but we will exam mobility post op.   Renette Butters, MD Cell [phone]   06/15/2015 9:25  AM

## 2015-06-15 NOTE — H&P (Signed)
PCP:   Elby Showers, MD   Chief Complaint:  fall  HPI: [name] was walking her daughter's dog a black lab. The labs saw another family individual anticough running. The leash was wrapped her in the patient's right wrist and she fell on hard concrete. She is not able to get up. She was taken straight to the ER. She has history of hypertension but no other medical issues. She denies any history of chest pain, history of congestive heart failure. She does offer housework. History provided by the patient.   Review of Systems:  The patient denies anorexia, fever, weight loss,, vision loss, decreased hearing, hoarseness, chest pain, syncope, dyspnea on exertion, peripheral edema, balance deficits, hemoptysis, abdominal pain, melena, hematochezia, severe indigestion/heartburn, hematuria, incontinence, genital sores, muscle weakness, suspicious skin lesions, transient blindness, difficulty walking, depression, unusual weight change, abnormal bleeding, enlarged lymph nodes, angioedema, and breast masses.  Past Medical History: Past Medical History  Diagnosis Date  . Hypertension   . Attention deficit disorder    History reviewed. No pertinent past surgical history.  Medications: Prior to Admission medications   Medication Sig Start Date End Date Taking? Authorizing Provider  b complex vitamins tablet Take 1 tablet by mouth daily.   Yes Historical Provider, MD  lisdexamfetamine (VYVANSE) 50 MG capsule Take 50 mg by mouth daily.   Yes Historical Provider, MD  MAGNESIUM PO Take 1 tablet by mouth daily.   Yes Historical Provider, MD  metoprolol (LOPRESSOR) 50 MG tablet Take 50 mg by mouth daily.   Yes Historical Provider, MD  Multiple Vitamin (MULTIVITAMIN WITH MINERALS) TABS tablet Take 1 tablet by mouth daily.   Yes Historical Provider, MD  metoprolol (TOPROL-XL) 50 MG 24 hr tablet TAKE 1 TABLET EVERY DAY Patient not taking: Reported on 06/14/2015 07/24/11   Elby Showers, MD     Allergies:  No Known Allergies  Social History:  reports that she has never smoked. She does not have any smokeless tobacco history on file. She reports that she drinks alcohol. Her drug history is not on file.  Family History: History reviewed. Alopecia universalis  Physical Exam: Filed Vitals:   06/14/15 2313 06/15/15 0422 06/15/15 0533  BP: 124/71 124/71 152/88  Pulse: 72 72 75  Temp: 97.8 F (36.6 C) 97.8 F (36.6 C) 97.8 F (36.6 C)  TempSrc: Oral    Resp: 18 18 18   Weight: 58.968 kg (130 lb)    SpO2: 96% 96% 94%    General:  Alert and oriented times three, well developed and nourished, no acute distress Eyes: PERRLA, pink conjunctiva, no scleral icterus ENT: Moist oral mucosa, neck supple, no thyromegaly Lungs: clear to ascultation, no wheeze, no crackles, no use of accessory muscles Cardiovascular: regular rate and rhythm, no regurgitation, no gallops, no murmurs. No carotid bruits, no JVD Abdomen: soft, positive BS, non-tender, non-distended, no organomegaly, not an acute abdomen GU: not examined Neuro: CN II - XII grossly intact, sensation intact Musculoskeletal: RUE & RLE weakness secondary to pain and fracture Skin: no rash, no subcutaneous crepitation, no decubitus Psych: appropriate patient   Labs on Admission:   Recent Labs  06/15/15 0355  NA 138  K 3.9  CL 106  CO2 23  GLUCOSE 121*  BUN 10  CREATININE 0.44  CALCIUM 8.8*   No results for input(s): AST, ALT, ALKPHOS, BILITOT, PROT, ALBUMIN in the last 72 hours. No results for input(s): LIPASE, AMYLASE in the last 72 hours.  Recent Labs  06/15/15 0355  WBC 11.6*  NEUTROABS 10.0*  HGB 13.7  HCT 39.8  MCV 97.3  PLT 234    Micro Results: No results found for this or any previous visit (from the past 240 hour(s)).   Radiological Exams on Admission: Dg Shoulder Right  06/15/2015   CLINICAL DATA:  64 year old female with fall and right shoulder pain.  EXAM: RIGHT SHOULDER - 2+ VIEW   COMPARISON:  None.  FINDINGS: There is comminuted appearing fracture of the right humeral head with involvement of the greater tuberosity. There is possible extension of the fracture to the humeral neck. A small displaced fracture fragment noted lateral to the humeral head. There is anatomic alignment of the humeral head with the glenoid fossa.  IMPRESSION: Comminuted fracture of the right humeral head with possible extension to the neck of the humerus.   Electronically Signed   By: Anner Crete M.D.   On: 06/15/2015 01:36   Ct Pelvis Wo Contrast  06/15/2015   CLINICAL DATA:  [place] around 2230 hours yesterday while walking dog, pain increases with weight-bearing.  EXAM: CT PELVIS WITHOUT CONTRAST  TECHNIQUE: Multidetector CT imaging of the pelvis was performed following the standard protocol without intravenous contrast.  COMPARISON:  RIGHT hip radiograph June 15, 2015 at 0041 hours  FINDINGS: Faint linear RIGHT intertrochanteric lucency concerning for nondisplaced fracture (axial 62 - 69 of 85). Femoral heads are well formed, moderate to severe LEFT, moderate RIGHT hip joint space narrowing, periarticular sclerosis and femoral head spurring consistent with osteoarthrosis. No dislocation.  Moderate calcific atherosclerosis of the imaged aortoiliac vessels. Status post hysterectomy. Urinary bladder is well distended unremarkable. Phleboliths in the pelvis. RIGHT hip subcutaneous fat stranding without subcutaneous gas or radiopaque foreign bodies. Osteopenia without destructive bony lesions.  IMPRESSION: Suspected nondisplaced RIGHT femur intertrochanteric fracture, though MRI would be more sensitive. No dislocation.  RIGHT hip soft tissue swelling/contusion.  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   Electronically Signed   By: Elon Alas M.D.   On: 06/15/2015 03:03   Ct Shoulder Right Wo Contrast  06/15/2015   CLINICAL DATA:  64 year old female with trauma to the right shoulder.   EXAM: CT OF THE RIGHT SHOULDER WITHOUT CONTRAST  TECHNIQUE: Multidetector CT imaging was performed according to the standard protocol. Multiplanar CT image reconstructions were also generated.  COMPARISON:  Radiograph dated 05/1915  FINDINGS: There is comminuted fracture of the right humeral head with fracture of the greater tuberosity. There is nondisplaced fracture of the neck of the right humerus. There is a small displaced cortical fracture fragment from the posterior humeral head. The humeral head remains in anatomic articulation with the glenoid fossa. There is soft tissue swelling with small intermuscular hematoma.  IMPRESSION: Comminuted fracture of the right humeral hand mid nondisplaced fracture of neck of the humerus. No dislocation.   Electronically Signed   By: Anner Crete M.D.   On: 06/15/2015 03:00   Dg Hip Unilat With Pelvis 2-3 Views Right  06/15/2015   CLINICAL DATA:  [place] onto right side while walking dog, with right anterior hip pain. Initial encounter.  EXAM: DG HIP (WITH OR WITHOUT PELVIS) 2-3V RIGHT  COMPARISON:  None.  FINDINGS: There is no evidence of fracture or dislocation. Both femoral heads are seated normally within their respective acetabula. Minimal joint space narrowing is noted at the left hip. The proximal right femur appears intact. No significant degenerative change is appreciated. The sacroiliac joints are unremarkable in appearance.  The visualized  bowel gas pattern is grossly unremarkable in appearance.  IMPRESSION: No evidence of fracture or dislocation.   Electronically Signed   By: Garald Balding M.D.   On: 06/15/2015 01:37    Assessment/Plan Present on Admission:  . Intertrochanteric fracture of right femur . Comminuted right humeral fracture -admit to [facility] Percell Miller aware -pain medications as needed Hypertension -Stable, home medications resumed    Jeanne Smith 06/15/2015, 5:46 AM

## 2015-06-15 NOTE — ED Provider Notes (Signed)
CSN: [id number]     Arrival date & time 06/14/15  2313 History  This chart was scribed for Jeanne Flemings, MD by Rayna Sexton, ED scribe. This patient was seen in room WA04/WA04 and the patient's care was started at 12:06 AM.    Chief Complaint  Patient presents with  . Fall   The history is provided by the patient. No language interpreter was used.    HPI Comments: Jeanne Smith is a 64 y.o. female who presents to the Emergency Department complaining of a fall that occurred at 10:30 pm tonight. She notes being drug through a yard by her mid-sized dog resulting in associated pain to her right leg, shoulder and arm as well as random abrasions to her extremities and notes a worsening of her symptoms when bearing weight or ambulating. Pt notes being right handed and further notes no trouble moving the fingers on her right hand. Pt notes having had a tetanus vaccination within the last 10 years and denies any known drug allergies. She notes a history of HTN and ADD and currently taking medications for both ailments. Pt denies any head trauma or LOC.   Past Medical History  Diagnosis Date  . Hypertension   . Attention deficit disorder    History reviewed. No pertinent past surgical history. History reviewed. No pertinent family history. History  Substance Use Topics  . Smoking status: Never Smoker   . Smokeless tobacco: Not on file  . Alcohol Use: Yes   OB History    No data available     Review of Systems  Musculoskeletal: Positive for myalgias and arthralgias. Negative for back pain and neck pain.  Skin: Positive for wound.  Neurological: Negative for syncope and headaches.  All other systems reviewed and are negative.   Allergies  Review of patient's allergies indicates no known allergies.  Home Medications   Prior to Admission medications   Medication Sig Start Date End Date Taking? Authorizing Provider  b complex vitamins tablet Take 1 tablet by mouth daily.   Yes  Historical Provider, MD  lisdexamfetamine (VYVANSE) 50 MG capsule Take 50 mg by mouth daily.   Yes Historical Provider, MD  MAGNESIUM PO Take 1 tablet by mouth daily.   Yes Historical Provider, MD  metoprolol (LOPRESSOR) 50 MG tablet Take 50 mg by mouth daily.   Yes Historical Provider, MD  Multiple Vitamin (MULTIVITAMIN WITH MINERALS) TABS tablet Take 1 tablet by mouth daily.   Yes Historical Provider, MD  metoprolol (TOPROL-XL) 50 MG 24 hr tablet TAKE 1 TABLET EVERY DAY Patient not taking: Reported on 06/14/2015 07/24/11   Elby Showers, MD   BP 124/71 mmHg  Pulse 72  Temp(Src) 97.8 F (36.6 C) (Oral)  Resp 18  Wt 130 lb (58.968 kg)  SpO2 96% Physical Exam  Constitutional: She is oriented to person, place, and time. She appears well-developed and well-nourished.  HENT:  Head: Normocephalic and atraumatic.  Right Ear: External ear normal.  Left Ear: External ear normal.  Nose: Nose normal.  Mouth/Throat: Oropharynx is clear and moist.  Eyes: Conjunctivae and EOM are normal. Pupils are equal, round, and reactive to light.  Neck: Normal range of motion. Neck supple. No JVD present. No tracheal deviation present. No thyromegaly present.  Cardiovascular: Normal rate, regular rhythm, normal heart sounds and intact distal pulses.  Exam reveals no gallop and no friction rub.   No murmur heard. Pulmonary/Chest: Effort normal and breath sounds normal. No stridor. No respiratory distress. She  has no wheezes. She has no rales. She exhibits no tenderness.  Abdominal: Soft. Bowel sounds are normal. She exhibits no distension and no mass. There is no tenderness. There is no rebound and no guarding.  Musculoskeletal: Normal range of motion. She exhibits no edema or tenderness.  Scattered abrasions, left palm, right elbow, right knee.  Right lower extremity: Patient has normal range of motion of ankle and knee.  Full range of motion at hip.  When pressure is applied to bilateral foot.  She complains  of pain in pelvis.  There is no step-off or crepitus.  Stable pelvis to palpation but pain with rocking the pelvis.  Right upper extremity: Swelling to right shoulder.  There is no step-off or crepitus over the clavicle.  There is crepitus over the humeral head, without sulcus sign consistent with dislocation.  She has pain with attempted range of motion of the shoulder.  Right elbow wrist and hand are normal  Lymphadenopathy:    She has no cervical adenopathy.  Neurological: She is alert and oriented to person, place, and time. She displays normal reflexes. She exhibits normal muscle tone. Coordination normal.  Skin: Skin is warm and dry. No rash noted. No erythema. No pallor.  Psychiatric: She has a normal mood and affect. Her behavior is normal. Judgment and thought content normal.  Nursing note and vitals reviewed.   ED Course  Procedures  DIAGNOSTIC STUDIES: Oxygen Saturation is 96% on RA, normal by my interpretation.    COORDINATION OF CARE: 12:12 AM Discussed treatment plan with pt at bedside and pt agreed to plan.  Labs Review Labs Reviewed  CBC WITH DIFFERENTIAL/PLATELET - Abnormal; Notable for the following:    WBC 11.6 (*)    Neutrophils Relative % 86 (*)    Neutro Abs 10.0 (*)    Lymphocytes Relative 10 (*)    All other components within normal limits  BASIC METABOLIC PANEL  PROTIME-INR  TYPE AND SCREEN    Imaging Review Dg Shoulder Right  06/15/2015   CLINICAL DATA:  64 year old female with fall and right shoulder pain.  EXAM: RIGHT SHOULDER - 2+ VIEW  COMPARISON:  None.  FINDINGS: There is comminuted appearing fracture of the right humeral head with involvement of the greater tuberosity. There is possible extension of the fracture to the humeral neck. A small displaced fracture fragment noted lateral to the humeral head. There is anatomic alignment of the humeral head with the glenoid fossa.  IMPRESSION: Comminuted fracture of the right humeral head with possible  extension to the neck of the humerus.   Electronically Signed   By: Anner Crete M.D.   On: 06/15/2015 01:36   Ct Pelvis Wo Contrast  06/15/2015   CLINICAL DATA:  [place] around 2230 hours yesterday while walking dog, pain increases with weight-bearing.  EXAM: CT PELVIS WITHOUT CONTRAST  TECHNIQUE: Multidetector CT imaging of the pelvis was performed following the standard protocol without intravenous contrast.  COMPARISON:  RIGHT hip radiograph June 15, 2015 at 0041 hours  FINDINGS: Faint linear RIGHT intertrochanteric lucency concerning for nondisplaced fracture (axial 62 - 69 of 85). Femoral heads are well formed, moderate to severe LEFT, moderate RIGHT hip joint space narrowing, periarticular sclerosis and femoral head spurring consistent with osteoarthrosis. No dislocation.  Moderate calcific atherosclerosis of the imaged aortoiliac vessels. Status post hysterectomy. Urinary bladder is well distended unremarkable. Phleboliths in the pelvis. RIGHT hip subcutaneous fat stranding without subcutaneous gas or radiopaque foreign bodies. Osteopenia without destructive bony lesions.  IMPRESSION: Suspected nondisplaced RIGHT femur intertrochanteric fracture, though MRI would be more sensitive. No dislocation.  RIGHT hip soft tissue swelling/contusion.  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   Electronically Signed   By: Elon Alas M.D.   On: 06/15/2015 03:03   Ct Shoulder Right Wo Contrast  06/15/2015   CLINICAL DATA:  64 year old female with trauma to the right shoulder.  EXAM: CT OF THE RIGHT SHOULDER WITHOUT CONTRAST  TECHNIQUE: Multidetector CT imaging was performed according to the standard protocol. Multiplanar CT image reconstructions were also generated.  COMPARISON:  Radiograph dated 05/1915  FINDINGS: There is comminuted fracture of the right humeral head with fracture of the greater tuberosity. There is nondisplaced fracture of the neck of the right humerus. There is a small  displaced cortical fracture fragment from the posterior humeral head. The humeral head remains in anatomic articulation with the glenoid fossa. There is soft tissue swelling with small intermuscular hematoma.  IMPRESSION: Comminuted fracture of the right humeral hand mid nondisplaced fracture of neck of the humerus. No dislocation.   Electronically Signed   By: Anner Crete M.D.   On: 06/15/2015 03:00   Dg Hip Unilat With Pelvis 2-3 Views Right  06/15/2015   CLINICAL DATA:  [place] onto right side while walking dog, with right anterior hip pain. Initial encounter.  EXAM: DG HIP (WITH OR WITHOUT PELVIS) 2-3V RIGHT  COMPARISON:  None.  FINDINGS: There is no evidence of fracture or dislocation. Both femoral heads are seated normally within their respective acetabula. Minimal joint space narrowing is noted at the left hip. The proximal right femur appears intact. No significant degenerative change is appreciated. The sacroiliac joints are unremarkable in appearance.  The visualized bowel gas pattern is grossly unremarkable in appearance.  IMPRESSION: No evidence of fracture or dislocation.   Electronically Signed   By: Garald Balding M.D.   On: 06/15/2015 01:37     EKG Interpretation None      MDM   Final diagnoses:  Pain  Closed intratrochanteric fracture of right femur, initial encounter  Humeral head fracture, right, closed, initial encounter  Fall, initial encounter   64 year old female status post fall after being dragged by a dog.  She is complaining of pain with weightbearing on right lower extremity, and right shoulder pain and immobility.  Exam concerning for possible pelvic fracture.  Given good mobility of the hip but pain with pressure placed on the palm of the foot.  No knee or ankle injury noted.  Patient with crepitus to right shoulder without obvious deformity, possible proximal humerus fracture.  Plan for pain control and x-ray.  I personally performed the services described in this  documentation, which was scribed in my presence. The recorded information has been reviewed and is accurate.  Case was discussed with Dr. Fredonia Highland, on call for orthopedics.  He recommends getting shoulder CT sling and follow-up in the office.  Patient has been unable to ambulate, as we are getting a CT of her shoulder, will plan for pelvis CT as well   Patient with intertrochanteric fracture of right hip nondisplaced.  Case rediscussed with Dr. Fredonia Highland, who recommends admission to the hospitalist service on the hip fracture pathway, and request transfer to [facility]  Hospitalist was consult.  Labs and holding orders placed.    Jeanne Flemings, MD 06/15/15 [phone]

## 2015-06-16 ENCOUNTER — Inpatient Hospital Stay (HOSPITAL_COMMUNITY): Payer: BLUE CROSS/BLUE SHIELD

## 2015-06-16 ENCOUNTER — Inpatient Hospital Stay (HOSPITAL_COMMUNITY): Payer: BLUE CROSS/BLUE SHIELD | Admitting: Anesthesiology

## 2015-06-16 ENCOUNTER — Encounter (HOSPITAL_COMMUNITY)
Admission: EM | Disposition: A | Discharge status: Home Health | Payer: Self-pay | Source: Home / Self Care | Attending: Internal Medicine

## 2015-06-16 DIAGNOSIS — S42351A Displaced comminuted fracture of shaft of humerus, right arm, initial encounter for closed fracture: Secondary | ICD-10-CM

## 2015-06-16 DIAGNOSIS — S72141A Displaced intertrochanteric fracture of right femur, initial encounter for closed fracture: Principal | ICD-10-CM

## 2015-06-16 DIAGNOSIS — I10 Essential (primary) hypertension: Secondary | ICD-10-CM

## 2015-06-16 HISTORY — PX: FEMUR IM NAIL: SHX1597

## 2015-06-16 LAB — CBC
HCT: 39.3 % (ref 36.0–46.0)
HEMOGLOBIN: 13.2 g/dL (ref 12.0–15.0)
MCH: 33.2 pg (ref 26.0–34.0)
MCHC: 33.6 g/dL (ref 30.0–36.0)
MCV: 99 fL (ref 78.0–100.0)
PLATELETS: 175 10*3/uL (ref 150–400)
RBC: 3.97 MIL/uL (ref 3.87–5.11)
RDW: 13.1 % (ref 11.5–15.5)
WBC: 7.2 10*3/uL (ref 4.0–10.5)

## 2015-06-16 SURGERY — INSERTION, INTRAMEDULLARY ROD, FEMUR
Anesthesia: General | Site: Leg Upper | Laterality: Right

## 2015-06-16 MED ORDER — ACETAMINOPHEN 500 MG PO TABS
1000.0000 mg | ORAL_TABLET | Freq: Once | ORAL | Status: AC
  Administered 2015-06-16: 1000 mg via ORAL
  Filled 2015-06-16: qty 2

## 2015-06-16 MED ORDER — ACETAMINOPHEN 325 MG PO TABS: 650.0000 mg | ORAL_TABLET | Freq: Four times a day (QID) | ORAL | Status: DC | PRN

## 2015-06-16 MED ORDER — MEPERIDINE HCL 25 MG/ML IJ SOLN: 6.2500 mg | INTRAMUSCULAR | Status: DC | PRN

## 2015-06-16 MED ORDER — PROPOFOL 10 MG/ML IV BOLUS
INTRAVENOUS | Status: DC | PRN
  Administered 2015-06-16: 125 mg via INTRAVENOUS

## 2015-06-16 MED ORDER — MIDAZOLAM HCL 5 MG/5ML IJ SOLN
INTRAMUSCULAR | Status: DC | PRN
  Administered 2015-06-16: 2 mg via INTRAVENOUS

## 2015-06-16 MED ORDER — ASPIRIN EC 325 MG PO TBEC
325.0000 mg | DELAYED_RELEASE_TABLET | Freq: Every day | ORAL | Status: DC
  Administered 2015-06-17 – 2015-06-18 (×2): 325 mg via ORAL
  Filled 2015-06-16 (×2): qty 1

## 2015-06-16 MED ORDER — LIDOCAINE HCL (CARDIAC) 20 MG/ML IV SOLN
INTRAVENOUS | Status: AC
  Filled 2015-06-16: qty 5

## 2015-06-16 MED ORDER — MIDAZOLAM HCL 2 MG/2ML IJ SOLN
INTRAMUSCULAR | Status: AC
  Filled 2015-06-16: qty 2

## 2015-06-16 MED ORDER — POTASSIUM CHLORIDE IN NACL 20-0.45 MEQ/L-% IV SOLN
INTRAVENOUS | Status: DC
  Administered 2015-06-16: 09:00:00 via INTRAVENOUS
  Filled 2015-06-16 (×3): qty 1000

## 2015-06-16 MED ORDER — LACTATED RINGERS IV SOLN
INTRAVENOUS | Status: DC | PRN
  Administered 2015-06-16: 1000 mL
  Administered 2015-06-16 (×2): via INTRAVENOUS

## 2015-06-16 MED ORDER — PHENYLEPHRINE HCL 10 MG/ML IJ SOLN
INTRAMUSCULAR | Status: DC | PRN
  Administered 2015-06-16: 120 ug via INTRAVENOUS
  Administered 2015-06-16 (×2): 80 ug via INTRAVENOUS
  Administered 2015-06-16: 120 ug via INTRAVENOUS

## 2015-06-16 MED ORDER — ARTIFICIAL TEARS OP OINT
TOPICAL_OINTMENT | OPHTHALMIC | Status: AC
  Filled 2015-06-16: qty 7

## 2015-06-16 MED ORDER — DOCUSATE SODIUM 100 MG PO CAPS: 100.0000 mg | ORAL_CAPSULE | Freq: Two times a day (BID) | ORAL | Status: DC

## 2015-06-16 MED ORDER — NEOSTIGMINE METHYLSULFATE 10 MG/10ML IV SOLN
INTRAVENOUS | Status: AC
  Filled 2015-06-16: qty 1

## 2015-06-16 MED ORDER — HYDROMORPHONE HCL 1 MG/ML IJ SOLN
INTRAMUSCULAR | Status: AC
  Filled 2015-06-16: qty 1

## 2015-06-16 MED ORDER — METOPROLOL TARTRATE 1 MG/ML IV SOLN
2.5000 mg | Freq: Once | INTRAVENOUS | Status: DC
  Filled 2015-06-16: qty 5

## 2015-06-16 MED ORDER — HYDROCODONE-ACETAMINOPHEN 5-325 MG PO TABS
ORAL_TABLET | ORAL | Status: AC
  Filled 2015-06-16: qty 2

## 2015-06-16 MED ORDER — ONDANSETRON HCL 4 MG/2ML IJ SOLN
INTRAMUSCULAR | Status: AC
  Filled 2015-06-16: qty 2

## 2015-06-16 MED ORDER — EPHEDRINE SULFATE 50 MG/ML IJ SOLN
INTRAMUSCULAR | Status: AC
  Filled 2015-06-16: qty 2

## 2015-06-16 MED ORDER — ASPIRIN 325 MG PO TABS: 325.0000 mg | ORAL_TABLET | Freq: Every day | ORAL | Status: DC

## 2015-06-16 MED ORDER — METOCLOPRAMIDE HCL 5 MG/ML IJ SOLN: 5.0000 mg | Freq: Three times a day (TID) | INTRAMUSCULAR | Status: DC | PRN

## 2015-06-16 MED ORDER — FENTANYL CITRATE (PF) 250 MCG/5ML IJ SOLN
INTRAMUSCULAR | Status: AC
  Filled 2015-06-16: qty 5

## 2015-06-16 MED ORDER — MENTHOL 3 MG MT LOZG: 1.0000 | LOZENGE | OROMUCOSAL | Status: DC | PRN

## 2015-06-16 MED ORDER — LIDOCAINE HCL (CARDIAC) 20 MG/ML IV SOLN
INTRAVENOUS | Status: DC | PRN
  Administered 2015-06-16: 50 mg via INTRAVENOUS

## 2015-06-16 MED ORDER — CEFAZOLIN SODIUM-DEXTROSE 2-3 GM-% IV SOLR
2.0000 g | INTRAVENOUS | Status: AC
  Administered 2015-06-16: 2 g via INTRAVENOUS
  Filled 2015-06-16: qty 50

## 2015-06-16 MED ORDER — NEOSTIGMINE METHYLSULFATE 10 MG/10ML IV SOLN
INTRAVENOUS | Status: DC | PRN
  Administered 2015-06-16: 3 mg via INTRAVENOUS

## 2015-06-16 MED ORDER — ONDANSETRON HCL 4 MG PO TABS: 4.0000 mg | ORAL_TABLET | Freq: Three times a day (TID) | ORAL | Status: DC | PRN

## 2015-06-16 MED ORDER — EPHEDRINE SULFATE 50 MG/ML IJ SOLN
INTRAMUSCULAR | Status: DC | PRN
  Administered 2015-06-16: 10 mg via INTRAVENOUS
  Administered 2015-06-16: 15 mg via INTRAVENOUS

## 2015-06-16 MED ORDER — CHLORHEXIDINE GLUCONATE 4 % EX LIQD
60.0000 mL | Freq: Once | CUTANEOUS | Status: AC
  Administered 2015-06-16: 4 via TOPICAL
  Filled 2015-06-16: qty 60

## 2015-06-16 MED ORDER — ACETAMINOPHEN 650 MG RE SUPP: 650.0000 mg | Freq: Four times a day (QID) | RECTAL | Status: DC | PRN

## 2015-06-16 MED ORDER — HYDROMORPHONE HCL 1 MG/ML IJ SOLN
0.2500 mg | INTRAMUSCULAR | Status: DC | PRN
  Administered 2015-06-16 (×2): 0.5 mg via INTRAVENOUS

## 2015-06-16 MED ORDER — METOCLOPRAMIDE HCL 5 MG PO TABS
5.0000 mg | ORAL_TABLET | Freq: Three times a day (TID) | ORAL | Status: DC | PRN
Start: 2015-06-16 — End: 2015-06-18

## 2015-06-16 MED ORDER — ONDANSETRON HCL 4 MG/2ML IJ SOLN
INTRAMUSCULAR | Status: DC | PRN
  Administered 2015-06-16: 4 mg via INTRAVENOUS

## 2015-06-16 MED ORDER — ONDANSETRON HCL 4 MG/2ML IJ SOLN
4.0000 mg | Freq: Once | INTRAMUSCULAR | Status: AC | PRN
  Administered 2015-06-16: 4 mg via INTRAVENOUS

## 2015-06-16 MED ORDER — PHENOL 1.4 % MT LIQD: 1.0000 | OROMUCOSAL | Status: DC | PRN

## 2015-06-16 MED ORDER — ROCURONIUM BROMIDE 50 MG/5ML IV SOLN
INTRAVENOUS | Status: AC
  Filled 2015-06-16: qty 2

## 2015-06-16 MED ORDER — ONDANSETRON HCL 4 MG PO TABS: 4.0000 mg | ORAL_TABLET | Freq: Four times a day (QID) | ORAL | Status: DC | PRN

## 2015-06-16 MED ORDER — GLYCOPYRROLATE 0.2 MG/ML IJ SOLN
INTRAMUSCULAR | Status: DC | PRN
  Administered 2015-06-16: 0.6 mg via INTRAVENOUS

## 2015-06-16 MED ORDER — HYDROCODONE-ACETAMINOPHEN 5-325 MG PO TABS: 1.0000 | ORAL_TABLET | Freq: Four times a day (QID) | ORAL | Status: DC | PRN

## 2015-06-16 MED ORDER — ONDANSETRON HCL 4 MG/2ML IJ SOLN: 4.0000 mg | Freq: Four times a day (QID) | INTRAMUSCULAR | Status: DC | PRN

## 2015-06-16 MED ORDER — GLYCOPYRROLATE 0.2 MG/ML IJ SOLN
INTRAMUSCULAR | Status: AC
  Filled 2015-06-16: qty 6

## 2015-06-16 MED ORDER — MORPHINE SULFATE 2 MG/ML IJ SOLN
0.5000 mg | INTRAMUSCULAR | Status: DC | PRN
  Administered 2015-06-16 – 2015-06-17 (×3): 0.5 mg via INTRAVENOUS
  Filled 2015-06-16 (×3): qty 1

## 2015-06-16 MED ORDER — SODIUM CHLORIDE 0.9 % IJ SOLN
INTRAMUSCULAR | Status: AC
  Filled 2015-06-16: qty 20

## 2015-06-16 MED ORDER — METHOCARBAMOL 500 MG PO TABS: 500.0000 mg | ORAL_TABLET | Freq: Four times a day (QID) | ORAL | Status: DC

## 2015-06-16 MED ORDER — ARTIFICIAL TEARS OP OINT
TOPICAL_OINTMENT | OPHTHALMIC | Status: DC | PRN
  Administered 2015-06-16: 1 via OPHTHALMIC

## 2015-06-16 MED ORDER — 0.9 % SODIUM CHLORIDE (POUR BTL) OPTIME
TOPICAL | Status: DC | PRN
  Administered 2015-06-16: 1000 mL

## 2015-06-16 MED ORDER — CEFAZOLIN SODIUM-DEXTROSE 2-3 GM-% IV SOLR
2.0000 g | Freq: Four times a day (QID) | INTRAVENOUS | Status: AC
  Administered 2015-06-16: 2 g via INTRAVENOUS
  Filled 2015-06-16 (×2): qty 50

## 2015-06-16 MED ORDER — HYDROMORPHONE HCL 1 MG/ML IJ SOLN
INTRAMUSCULAR | Status: AC
  Administered 2015-06-16: 1 mg via INTRAVENOUS
  Filled 2015-06-16: qty 1

## 2015-06-16 MED ORDER — ROCURONIUM BROMIDE 100 MG/10ML IV SOLN
INTRAVENOUS | Status: DC | PRN
  Administered 2015-06-16: 50 mg via INTRAVENOUS

## 2015-06-16 MED ORDER — FENTANYL CITRATE (PF) 100 MCG/2ML IJ SOLN
INTRAMUSCULAR | Status: DC | PRN
  Administered 2015-06-16 (×2): 50 ug via INTRAVENOUS
  Administered 2015-06-16: 100 ug via INTRAVENOUS
  Administered 2015-06-16: 50 ug via INTRAVENOUS

## 2015-06-16 SURGICAL SUPPLY — 42 items
CLOSURE STERI-STRIP 1/2X4 (GAUZE/BANDAGES/DRESSINGS) ×1
CLSR STERI-STRIP ANTIMIC 1/2X4 (GAUZE/BANDAGES/DRESSINGS) ×1 IMPLANT
COVER PERINEAL POST (MISCELLANEOUS) ×3 IMPLANT
COVER SURGICAL LIGHT HANDLE (MISCELLANEOUS) ×3 IMPLANT
DRAPE STERI IOBAN 125X83 (DRAPES) ×3 IMPLANT
DRSG EMULSION OIL 3X3 NADH (GAUZE/BANDAGES/DRESSINGS) ×1 IMPLANT
DRSG MEPILEX BORDER 4X4 (GAUZE/BANDAGES/DRESSINGS) ×5 IMPLANT
DRSG TEGADERM 4X4.75 (GAUZE/BANDAGES/DRESSINGS) ×2 IMPLANT
DURAPREP 26ML APPLICATOR (WOUND CARE) ×3 IMPLANT
ELECT REM PT RETURN 9FT ADLT (ELECTROSURGICAL) ×3
ELECTRODE REM PT RTRN 9FT ADLT (ELECTROSURGICAL) ×1 IMPLANT
GAUZE SPONGE 4X4 12PLY STRL (GAUZE/BANDAGES/DRESSINGS) ×1 IMPLANT
GLOVE BIO SURGEON STRL SZ7 (GLOVE) ×3 IMPLANT
GLOVE BIO SURGEON STRL SZ7.5 (GLOVE) ×3 IMPLANT
GLOVE BIOGEL PI IND STRL 7.0 (GLOVE) ×1 IMPLANT
GLOVE BIOGEL PI IND STRL 8 (GLOVE) ×1 IMPLANT
GLOVE BIOGEL PI INDICATOR 7.0 (GLOVE) ×2
GLOVE BIOGEL PI INDICATOR 8 (GLOVE) ×2
GOWN STRL REUS W/ TWL LRG LVL3 (GOWN DISPOSABLE) ×1 IMPLANT
GOWN STRL REUS W/TWL LRG LVL3 (GOWN DISPOSABLE) ×3
GUIDEROD T2 3X1000 (ROD) ×2 IMPLANT
K-WIRE  3.2X450M STR (WIRE) ×4
K-WIRE 3.2X450M STR (WIRE) ×2
KIT BASIN OR (CUSTOM PROCEDURE TRAY) ×3 IMPLANT
KIT ROOM TURNOVER OR (KITS) ×3 IMPLANT
KWIRE 3.2X450M STR (WIRE) IMPLANT
LINER BOOT UNIVERSAL DISP (MISCELLANEOUS) ×1 IMPLANT
MANIFOLD NEPTUNE II (INSTRUMENTS) ×1 IMPLANT
NAIL GAMMA LG R 5TI 10X360X125 (Nail) ×2 IMPLANT
NS IRRIG 1000ML POUR BTL (IV SOLUTION) ×3 IMPLANT
PACK GENERAL/GYN (CUSTOM PROCEDURE TRAY) ×3 IMPLANT
PAD ARMBOARD 7.5X6 YLW CONV (MISCELLANEOUS) ×4 IMPLANT
SCREW LAG GAMMA 3 TI 10.5X85MM (Screw) ×2 IMPLANT
STAPLER VISISTAT 35W (STAPLE) ×1 IMPLANT
SUT MNCRL AB 4-0 PS2 18 (SUTURE) ×3 IMPLANT
SUT MON AB 2-0 CT1 36 (SUTURE) ×1 IMPLANT
SUT VIC AB 0 CT1 27 (SUTURE) ×3
SUT VIC AB 0 CT1 27XBRD ANBCTR (SUTURE) ×1 IMPLANT
TOWEL OR 17X24 6PK STRL BLUE (TOWEL DISPOSABLE) ×3 IMPLANT
TOWEL OR 17X26 10 PK STRL BLUE (TOWEL DISPOSABLE) ×3 IMPLANT
TOWEL OR NON WOVEN STRL DISP B (DISPOSABLE) ×1 IMPLANT
WATER STERILE IRR 1000ML POUR (IV SOLUTION) ×1 IMPLANT

## 2015-06-16 NOTE — Transfer of Care (Signed)
Immediate Anesthesia Transfer of Care Note  Patient: Jeanne Smith  Procedure(s) Performed: Procedure(s): INTRAMEDULLARY (IM) NAIL FEMORAL (Right)  Patient Location: PACU  Anesthesia Type:General  Level of Consciousness: awake, alert  and oriented  Airway & Oxygen Therapy: Patient Spontanous Breathing and Patient connected to nasal cannula oxygen  Post-op Assessment: Report given to RN, Post -op Vital signs reviewed and stable and Patient moving all extremities X 4  Post vital signs: Reviewed and stable  Last Vitals:  Filed Vitals:   06/16/15 1058  BP: 144/67  Pulse: 86  Temp: 37.2 C  Resp: 16    Complications: No apparent anesthesia complications

## 2015-06-16 NOTE — Progress Notes (Signed)
PT Cancellation Note  Patient Details Name: Jeanne Smith MRN: [id number] DOB: Aug 15, 1951   Cancelled Treatment:    Reason Eval/Treat Not Completed: Patient not medically ready   Noted likely for surgery today;  Will follow up for PT evaluation of mobility post-op;   Thank you,  Roney Marion, [place] Pager [phone] Office [phone]    [place], [employer] 06/16/2015, 8:08 AM

## 2015-06-16 NOTE — Anesthesia Preprocedure Evaluation (Addendum)
Anesthesia Evaluation  Patient identified by MRN, date of birth, ID band Patient awake    Reviewed: Allergy & Precautions, NPO status , Patient's Chart, lab work & pertinent test results  Airway Mallampati: I  TM Distance: >3 FB Neck ROM: full    Dental  (+) Teeth Intact   Pulmonary former smoker,    Pulmonary exam normal       Cardiovascular hypertension, Pt. on medications Normal cardiovascular examRhythm:regular     Neuro/Psych PSYCHIATRIC DISORDERS    GI/Hepatic   Endo/Other    Renal/GU      Musculoskeletal   Abdominal   Peds  Hematology   Anesthesia Other Findings   Reproductive/Obstetrics                            Anesthesia Physical Anesthesia Plan  ASA: II  Anesthesia Plan: General   Post-op Pain Management:    Induction: Intravenous  Airway Management Planned: Oral ETT  Additional Equipment:   Intra-op Plan:   Post-operative Plan: Extubation in OR  Informed Consent: I have reviewed the patients History and Physical, chart, labs and discussed the procedure including the risks, benefits and alternatives for the proposed anesthesia with the patient or authorized representative who has indicated his/her understanding and acceptance.   Dental Advisory Given  Plan Discussed with: CRNA and Surgeon  Anesthesia Plan Comments:        Anesthesia Quick Evaluation

## 2015-06-16 NOTE — Anesthesia Procedure Notes (Signed)
Procedure Name: Intubation Date/Time: 06/16/2015 12:16 PM Performed by: Neldon Newport Pre-anesthesia Checklist: Patient being monitored, Suction available, Emergency Drugs available, Patient identified and Timeout performed Patient Re-evaluated:Patient Re-evaluated prior to inductionOxygen Delivery Method: Circle system utilized Preoxygenation: Pre-oxygenation with 100% oxygen Intubation Type: IV induction Ventilation: Mask ventilation without difficulty Laryngoscope Size: Mac and 3 Grade View: Grade I Tube type: Oral Tube size: 7.0 mm Number of attempts: 1 Placement Confirmation: positive ETCO2,  breath sounds checked- equal and bilateral and ETT inserted through vocal cords under direct vision Secured at: 22 cm Tube secured with: Tape Dental Injury: Teeth and Oropharynx as per pre-operative assessment

## 2015-06-16 NOTE — H&P (View-Only) (Signed)
ORTHOPAEDIC CONSULTATION  REQUESTING PHYSICIAN: Modena Jansky, MD  Chief Complaint: fall, right shoulder pain, Right hip pain  HPI: Jeanne Smith is a 64 y.o. female who was walking her daughters dog when he ran and pulled on the leash around her wrist. She fell onto her Right side. She c/o R shoulder and groin pain. No other c/o. She denies N/T.   Past Medical History  Diagnosis Date  . Hypertension   . Attention deficit disorder    History reviewed. No pertinent past surgical history. History   Social History  . Marital Status: Legally Separated    Spouse Name: N/A  . Number of Children: N/A  . Years of Education: N/A   Social History Main Topics  . Smoking status: Never Smoker   . Smokeless tobacco: Not on file  . Alcohol Use: Yes  . Drug Use: Not on file  . Sexual Activity: Not on file   Other Topics Concern  . None   Social History Narrative  . None   History reviewed. No pertinent family history. No Known Allergies Prior to Admission medications   Medication Sig Start Date End Date Taking? Authorizing Provider  b complex vitamins tablet Take 1 tablet by mouth daily.   Yes Historical Provider, MD  lisdexamfetamine (VYVANSE) 50 MG capsule Take 50 mg by mouth daily.   Yes Historical Provider, MD  MAGNESIUM PO Take 1 tablet by mouth daily.   Yes Historical Provider, MD  metoprolol (LOPRESSOR) 50 MG tablet Take 50 mg by mouth daily.   Yes Historical Provider, MD  Multiple Vitamin (MULTIVITAMIN WITH MINERALS) TABS tablet Take 1 tablet by mouth daily.   Yes Historical Provider, MD  metoprolol (TOPROL-XL) 50 MG 24 hr tablet TAKE 1 TABLET EVERY DAY Patient not taking: Reported on 06/14/2015 07/24/11   Elby Showers, MD   Dg Shoulder Right  06/15/2015   CLINICAL DATA:  64 year old female with fall and right shoulder pain.  EXAM: RIGHT SHOULDER - 2+ VIEW  COMPARISON:  None.  FINDINGS: There is comminuted appearing fracture of the right humeral head with  involvement of the greater tuberosity. There is possible extension of the fracture to the humeral neck. A small displaced fracture fragment noted lateral to the humeral head. There is anatomic alignment of the humeral head with the glenoid fossa.  IMPRESSION: Comminuted fracture of the right humeral head with possible extension to the neck of the humerus.   Electronically Signed   By: Anner Crete M.D.   On: 06/15/2015 01:36   Ct Pelvis Wo Contrast  06/15/2015   CLINICAL DATA:  [place] around 2230 hours yesterday while walking dog, pain increases with weight-bearing.  EXAM: CT PELVIS WITHOUT CONTRAST  TECHNIQUE: Multidetector CT imaging of the pelvis was performed following the standard protocol without intravenous contrast.  COMPARISON:  RIGHT hip radiograph June 15, 2015 at 0041 hours  FINDINGS: Faint linear RIGHT intertrochanteric lucency concerning for nondisplaced fracture (axial 62 - 69 of 85). Femoral heads are well formed, moderate to severe LEFT, moderate RIGHT hip joint space narrowing, periarticular sclerosis and femoral head spurring consistent with osteoarthrosis. No dislocation.  Moderate calcific atherosclerosis of the imaged aortoiliac vessels. Status post hysterectomy. Urinary bladder is well distended unremarkable. Phleboliths in the pelvis. RIGHT hip subcutaneous fat stranding without subcutaneous gas or radiopaque foreign bodies. Osteopenia without destructive bony lesions.  IMPRESSION: Suspected nondisplaced RIGHT femur intertrochanteric fracture, though MRI would be more sensitive. No dislocation.  RIGHT hip soft tissue  swelling/contusion.  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   Electronically Signed   By: Elon Alas M.D.   On: 06/15/2015 03:03   Ct Shoulder Right Wo Contrast  06/15/2015   CLINICAL DATA:  64 year old female with trauma to the right shoulder.  EXAM: CT OF THE RIGHT SHOULDER WITHOUT CONTRAST  TECHNIQUE: Multidetector CT imaging was performed  according to the standard protocol. Multiplanar CT image reconstructions were also generated.  COMPARISON:  Radiograph dated 05/1915  FINDINGS: There is comminuted fracture of the right humeral head with fracture of the greater tuberosity. There is nondisplaced fracture of the neck of the right humerus. There is a small displaced cortical fracture fragment from the posterior humeral head. The humeral head remains in anatomic articulation with the glenoid fossa. There is soft tissue swelling with small intermuscular hematoma.  IMPRESSION: Comminuted fracture of the right humeral hand mid nondisplaced fracture of neck of the humerus. No dislocation.   Electronically Signed   By: Anner Crete M.D.   On: 06/15/2015 03:00   Dg Chest Port 1 View  06/15/2015   CLINICAL DATA:  Preoperative chest radiograph for right hip fracture.  EXAM: PORTABLE CHEST - 1 VIEW  COMPARISON:  None.  FINDINGS: The lungs are well-aerated and clear. There is no evidence of focal opacification, pleural effusion or pneumothorax.  The cardiomediastinal silhouette is within normal limits. No acute osseous abnormalities are seen. Bilateral breast implants are noted.  IMPRESSION: No acute cardiopulmonary process seen.   Electronically Signed   By: Garald Balding M.D.   On: 06/15/2015 05:55   Dg Hip Unilat With Pelvis 2-3 Views Right  06/15/2015   CLINICAL DATA:  [place] onto right side while walking dog, with right anterior hip pain. Initial encounter.  EXAM: DG HIP (WITH OR WITHOUT PELVIS) 2-3V RIGHT  COMPARISON:  None.  FINDINGS: There is no evidence of fracture or dislocation. Both femoral heads are seated normally within their respective acetabula. Minimal joint space narrowing is noted at the left hip. The proximal right femur appears intact. No significant degenerative change is appreciated. The sacroiliac joints are unremarkable in appearance.  The visualized bowel gas pattern is grossly unremarkable in appearance.  IMPRESSION: No  evidence of fracture or dislocation.   Electronically Signed   By: Garald Balding M.D.   On: 06/15/2015 01:37    Positive ROS: All other systems have been reviewed and were otherwise negative with the exception of those mentioned in the HPI and as above.  Labs cbc  Recent Labs  06/15/15 0355 06/15/15 0616  WBC 11.6* 9.6  HGB 13.7 13.6  HCT 39.8 39.9  PLT 234 196    Labs inflam No results for input(s): CRP in the last 72 hours.  Invalid input(s): ESR  Labs coag  Recent Labs  06/15/15 0355  INR 0.98     Recent Labs  06/15/15 0355 06/15/15 0616  NA 138 139  K 3.9 3.8  CL 106 106  CO2 23 25  GLUCOSE 121* 127*  BUN 10 9  CREATININE 0.44 0.45  CALCIUM 8.8* 8.8*    Physical Exam: Filed Vitals:   06/15/15 0533  BP: 152/88  Pulse: 75  Temp: 97.8 F (36.6 C)  Resp: 18   General: Alert, no acute distress Cardiovascular: No pedal edema Respiratory: No cyanosis, no use of accessory musculature GI: No organomegaly, abdomen is soft and non-tender Skin: No lesions in the area of chief complaint other than those listed below in MSK exam.  Neurologic: Sensation intact distally Psychiatric: Patient is competent for consent with normal mood and affect Lymphatic: No axillary or cervical lymphadenopathy  MUSCULOSKELETAL:  RUE: pain with shoulder ROM, some swelling. Distally SILT M/R/U, +EPL/FPL/IO, 2+ pulses, compartments are soft.  RLE: pain with hip ROM, Compartments soft, skin benign, Distally NVI.   Other extremities are atraumatic with painless ROM and NVI.  Assessment: R proximal humerus fracture R intertrochanteric fracture  Plan: R Hip: IM Nail on Wednesday 7/20. R proximal Humerus: Non-operative treatment, Sling when OOB, NWB RUE  She will be WBAT on the RLE post op  Dispo: she strongly desires to go home post op, and has good family support but we will exam mobility post op.   Renette Butters, MD Cell [phone]   06/15/2015 9:25  AM

## 2015-06-16 NOTE — Progress Notes (Signed)
PROGRESS NOTE    Jeanne Smith ZDG:[id number] DOB: [birth year] DOA: 06/14/2015 PCP: Elby Showers, MD  HPI/Brief narrative 64 year old female with history of HTN, independent and physically quite active (walks 4 miles 3 times per week), got entangled with her dog leash and fell on hard surface sustaining a right shoulder and right humeral fracture. Orthopedics/Dr. Percell Miller has been consulted-input pending. Patient nothing by mouth for potential surgery.   Assessment/Plan:  Nondisplaced right intertrochanteric femur fracture Comminuted fracture of the right humeral head and nondisplaced fracture of right humeral neck - Orthopedics/Dr. Percell Miller consulted- -R Hip: IM Nail on Wednesday 7/20. R proximal Humerus: Non-operative treatment, Sling when OOB, NWB RUE  - Postop weight-bearing, pain management, DVT prophylaxis and wound care per orthopedic service - Robaxin IV added for muscle spasms. Pain management  Essential hypertension - Controlled on metoprolol  ADHD - On Vyvanse   DVT prophylaxis: Subcutaneous Lovenox Code Status: Full Family Communication: sister at bedside Disposition Plan: To be determined.? SNF versus home when stable   Consultants:  Orthopedics  Procedures: R Hip: IM Nail on Wednesday 7/20. WBAT RLE post op R proximal Humerus: Non-operative treatment, Sling when OOB, NWB RUE   Antibiotics:  None  Subjective: Returned from OR, right hip pain needing prn pain meds. Some hoarseness, No chest pain, dyspnea, palpitations.  Objective: Filed Vitals:   06/16/15 1400 06/16/15 1415 06/16/15 1430 06/16/15 1511  BP: 157/87 158/71 152/71 167/91  Pulse: 92 81 81 79  Temp:   97.7 F (36.5 C) 97.5 F (36.4 C)  TempSrc:      Resp: 13 12 12 16   Weight:      SpO2: 96% 97% 98% 100%    Intake/Output Summary (Last 24 hours) at 06/16/15 1808 Last data filed at 06/16/15 1430  Gross per 24 hour  Intake 1157.5 ml  Output   1800 ml  Net -642.5 ml   Filed  Weights   06/14/15 2313  Weight: 58.968 kg (130 lb)     Exam:  General exam: Moderately built and nourished pleasant middle-aged female lying comfortably supine in bed. Respiratory system: Clear. No increased work of breathing. Cardiovascular system: S1 & S2 heard, RRR. No JVD, murmurs, gallops, clicks or pedal edema. Gastrointestinal system: Abdomen is nondistended, soft and nontender. Normal bowel sounds heard. Central nervous system: Alert and oriented. No focal neurological deficits. Extremities: Right upper extremity in sling  right lower extremity post op changes. Peripheral pulses symmetrically felt.   Data Reviewed: Basic Metabolic Panel:  Recent Labs Lab 06/15/15 0355 06/15/15 0616  NA 138 139  K 3.9 3.8  CL 106 106  CO2 23 25  GLUCOSE 121* 127*  BUN 10 9  CREATININE 0.44 0.45  CALCIUM 8.8* 8.8*   Liver Function Tests: No results for input(s): AST, ALT, ALKPHOS, BILITOT, PROT, ALBUMIN in the last 168 hours. No results for input(s): LIPASE, AMYLASE in the last 168 hours. No results for input(s): AMMONIA in the last 168 hours. CBC:  Recent Labs Lab 06/15/15 0355 06/15/15 0616 06/16/15 0508  WBC 11.6* 9.6 7.2  NEUTROABS 10.0*  --   --   HGB 13.7 13.6 13.2  HCT 39.8 39.9 39.3  MCV 97.3 96.4 99.0  PLT 234 196 175   Cardiac Enzymes: No results for input(s): CKTOTAL, CKMB, CKMBINDEX, TROPONINI in the last 168 hours. BNP (last 3 results) No results for input(s): PROBNP in the last 8760 hours. CBG: No results for input(s): GLUCAP in the last 168 hours.  Recent Results (from  the past 240 hour(s))  Surgical pcr screen     Status: None   Collection Time: 06/15/15  5:29 AM  Result Value Ref Range Status   MRSA, PCR NEGATIVE NEGATIVE Final   Staphylococcus aureus NEGATIVE NEGATIVE Final    Comment:        The Xpert SA Assay (FDA approved for NASAL specimens in patients over 6 years of age), is one component of a comprehensive surveillance program.   Test performance has been validated by [facility] for patients greater than or equal to 4 year old. It is not intended to diagnose infection nor to guide or monitor treatment.      EKG: 06/15/15: Sinus rhythm with PACs.? RSR pattern/? Intraventricular conduction abnormality in V1-V2 and no acute changes.    Studies: Dg Shoulder Right  06/15/2015   CLINICAL DATA:  64 year old female with fall and right shoulder pain.  EXAM: RIGHT SHOULDER - 2+ VIEW  COMPARISON:  None.  FINDINGS: There is comminuted appearing fracture of the right humeral head with involvement of the greater tuberosity. There is possible extension of the fracture to the humeral neck. A small displaced fracture fragment noted lateral to the humeral head. There is anatomic alignment of the humeral head with the glenoid fossa.  IMPRESSION: Comminuted fracture of the right humeral head with possible extension to the neck of the humerus.   Electronically Signed   By: Anner Crete M.D.   On: 06/15/2015 01:36   Ct Pelvis Wo Contrast  06/15/2015   CLINICAL DATA:  [place] around 2230 hours yesterday while walking dog, pain increases with weight-bearing.  EXAM: CT PELVIS WITHOUT CONTRAST  TECHNIQUE: Multidetector CT imaging of the pelvis was performed following the standard protocol without intravenous contrast.  COMPARISON:  RIGHT hip radiograph June 15, 2015 at 0041 hours  FINDINGS: Faint linear RIGHT intertrochanteric lucency concerning for nondisplaced fracture (axial 62 - 69 of 85). Femoral heads are well formed, moderate to severe LEFT, moderate RIGHT hip joint space narrowing, periarticular sclerosis and femoral head spurring consistent with osteoarthrosis. No dislocation.  Moderate calcific atherosclerosis of the imaged aortoiliac vessels. Status post hysterectomy. Urinary bladder is well distended unremarkable. Phleboliths in the pelvis. RIGHT hip subcutaneous fat stranding without subcutaneous gas or radiopaque foreign bodies.  Osteopenia without destructive bony lesions.  IMPRESSION: Suspected nondisplaced RIGHT femur intertrochanteric fracture, though MRI would be more sensitive. No dislocation.  RIGHT hip soft tissue swelling/contusion.  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   Electronically Signed   By: Elon Alas M.D.   On: 06/15/2015 03:03   Ct Shoulder Right Wo Contrast  06/15/2015   CLINICAL DATA:  64 year old female with trauma to the right shoulder.  EXAM: CT OF THE RIGHT SHOULDER WITHOUT CONTRAST  TECHNIQUE: Multidetector CT imaging was performed according to the standard protocol. Multiplanar CT image reconstructions were also generated.  COMPARISON:  Radiograph dated 05/1915  FINDINGS: There is comminuted fracture of the right humeral head with fracture of the greater tuberosity. There is nondisplaced fracture of the neck of the right humerus. There is a small displaced cortical fracture fragment from the posterior humeral head. The humeral head remains in anatomic articulation with the glenoid fossa. There is soft tissue swelling with small intermuscular hematoma.  IMPRESSION: Comminuted fracture of the right humeral hand mid nondisplaced fracture of neck of the humerus. No dislocation.   Electronically Signed   By: Anner Crete M.D.   On: 06/15/2015 03:00   Pelvis Portable  06/16/2015  CLINICAL DATA:  Status post ORIF of proximal right femoral fracture  EXAM: PORTABLE PELVIS 1-2 VIEWS  COMPARISON:  Films from earlier in the same day  FINDINGS: Medullary rod is now seen with compression screw extending into the femoral head on the right. No acute abnormality is noted. Mild degenerative changes of the hip joints are seen.  IMPRESSION: Status post ORIF of proximal right femoral fracture.   Electronically Signed   By: Inez Catalina M.D.   On: 06/16/2015 14:18   Dg Chest Port 1 View  06/15/2015   CLINICAL DATA:  Preoperative chest radiograph for right hip fracture.  EXAM: PORTABLE CHEST -  1 VIEW  COMPARISON:  None.  FINDINGS: The lungs are well-aerated and clear. There is no evidence of focal opacification, pleural effusion or pneumothorax.  The cardiomediastinal silhouette is within normal limits. No acute osseous abnormalities are seen. Bilateral breast implants are noted.  IMPRESSION: No acute cardiopulmonary process seen.   Electronically Signed   By: Garald Balding M.D.   On: 06/15/2015 05:55   Dg C-arm 1-60 Min  06/16/2015   CLINICAL DATA:  Right hip intertrochanteric fracture, status post right hip ORIF  EXAM: RIGHT FEMUR 2 VIEWS; DG C-ARM 61-120 MIN  COMPARISON:  06/15/2015  FINDINGS: 3 spot fluoroscopic intraoperative views of the right femur demonstrate intra medullary rod insertion and fixation screw for the nondisplaced right intertrochanteric fracture. No malalignment or hardware abnormality. Expected postoperative appearance.  IMPRESSION: Status post right hip ORIF for a nondisplaced right hip intertrochanteric fracture   Electronically Signed   By: Jerilynn Mages.  Shick M.D.   On: 06/16/2015 13:16   Dg Hip Unilat With Pelvis 2-3 Views Right  06/15/2015   CLINICAL DATA:  [place] onto right side while walking dog, with right anterior hip pain. Initial encounter.  EXAM: DG HIP (WITH OR WITHOUT PELVIS) 2-3V RIGHT  COMPARISON:  None.  FINDINGS: There is no evidence of fracture or dislocation. Both femoral heads are seated normally within their respective acetabula. Minimal joint space narrowing is noted at the left hip. The proximal right femur appears intact. No significant degenerative change is appreciated. The sacroiliac joints are unremarkable in appearance.  The visualized bowel gas pattern is grossly unremarkable in appearance.  IMPRESSION: No evidence of fracture or dislocation.   Electronically Signed   By: Garald Balding M.D.   On: 06/15/2015 01:37   Dg Femur, Min 2 Views Right  06/16/2015   CLINICAL DATA:  Right hip intertrochanteric fracture, status post right hip ORIF  EXAM: RIGHT  FEMUR 2 VIEWS; DG C-ARM 61-120 MIN  COMPARISON:  06/15/2015  FINDINGS: 3 spot fluoroscopic intraoperative views of the right femur demonstrate intra medullary rod insertion and fixation screw for the nondisplaced right intertrochanteric fracture. No malalignment or hardware abnormality. Expected postoperative appearance.  IMPRESSION: Status post right hip ORIF for a nondisplaced right hip intertrochanteric fracture   Electronically Signed   By: Jerilynn Mages.  Shick M.D.   On: 06/16/2015 13:16        Scheduled Meds: . [START ON 06/17/2015] aspirin EC  325 mg Oral Q breakfast  .  ceFAZolin (ANCEF) IV  2 g Intravenous Q6H  . enoxaparin (LOVENOX) injection  40 mg Subcutaneous Q24H  . lisdexamfetamine (VYVANSE) capsule 50 mg  50 mg Oral Daily  . metoprolol  2.5 mg Intravenous Once  . metoprolol succinate  50 mg Oral Daily  . ondansetron       Continuous Infusions:   Principal Problem:   Intertrochanteric fracture of  right femur Active Problems:   Comminuted right humeral fracture   HTN (hypertension)    Time spent: 25 minutes    Jalien Weakland, MD, FACP, FHM. [facility] Pager [phone]  If 7PM-7AM, please contact night-coverage [email or web address] Password [email or web address] 06/16/2015, 6:08 PM    LOS: 1 day

## 2015-06-16 NOTE — Op Note (Signed)
DATE OF SURGERY:  06/16/2015  TIME: 1:05 PM  PATIENT NAME:  Jeanne Smith  AGE: 64 y.o.  PRE-OPERATIVE DIAGNOSIS:  RIGHT HIP Fracture  POST-OPERATIVE DIAGNOSIS:  SAME  PROCEDURE:  INTRAMEDULLARY (IM) NAIL FEMORAL  SURGEON:  Iyari Hagner D  ASSISTANT:  Lovett Calender, PA-C, She was present and scrubbed throughout the case, critical for completion in a timely fashion, and for retraction, instrumentation, and closure.   OPERATIVE IMPLANTS: Stryker Gamma Nail with distal interlock screw  PREOPERATIVE INDICATIONS:  Jeanne Smith is a 64 y.o. year old who fell and suffered a hip fracture. She was brought into the ER and then admitted and optimized and then elected for surgical intervention.    The risks benefits and alternatives were discussed with the patient including but not limited to the risks of nonoperative treatment, versus surgical intervention including infection, bleeding, nerve injury, malunion, nonunion, hardware prominence, hardware failure, need for hardware removal, blood clots, cardiopulmonary complications, morbidity, mortality, among others, and they were willing to proceed.    OPERATIVE PROCEDURE:  The patient was brought to the operating room and placed in the supine position. General anesthesia was administered, with a foley. She was placed on the fracture table.  Closed reduction was performed under C-arm guidance. The length of the femur was also measured using fluoroscopy. Time out was then performed after sterile prep and drape. She received preoperative antibiotics.  Incision was made proximal to the greater trochanter. A guidewire was placed in the appropriate position. Confirmation was made on AP and lateral views. The above-named nail was opened. I opened the proximal femur with a reamer. I then placed the nail by hand easily down. I did not need to ream the femur.  Once the nail was completely seated, I placed a guidepin into the femoral head into the  center center position. I measured the length, and then reamed the lateral cortex and up into the head. I then placed the lag screw. Slight compression was applied. Anatomic fixation achieved. Bone quality was mediocre.  I then secured the proximal interlocking bolt, and took off a half a turn, and then removed the instruments, and took final C-arm pictures AP and lateral the entire length of the leg.   Anatomic reconstruction was achieved, and the wounds were irrigated copiously and closed with Vicryl followed by staples and sterile gauze for the skin. The patient was awakened and returned to PACU in stable and satisfactory condition. There no complications and the patient tolerated the procedure well.  She will be weightbearing as tolerated, and will be on ASA 325  for a period of four weeks after discharge.   Jeanne Smith, M.D.    This note was generated using a template and dragon dictation system. In light of that, I have reviewed the note and all aspects of it are applicable to this case. Any dictation errors are due to the computerized dictation system.

## 2015-06-16 NOTE — Interval H&P Note (Signed)
History and Physical Interval Note:  06/16/2015 8:29 AM  Jeanne Smith  has presented today for surgery, with the diagnosis of RIGHT HIP FX  The various methods of treatment have been discussed with the patient and family. After consideration of risks, benefits and other options for treatment, the patient has consented to  Procedure(s): INTRAMEDULLARY (IM) NAIL FEMORAL (Right) as a surgical intervention .  The patient's history has been reviewed, patient examined, no change in status, stable for surgery.  I have reviewed the patient's chart and labs.  Questions were answered to the patient's satisfaction.     MURPHY, TIMOTHY D

## 2015-06-16 NOTE — Discharge Instructions (Signed)
INSTRUCTIONS ° °o Remove items at home which could result in a fall. This includes throw rugs or furniture in walking pathways °o ICE to the affected joint every three hours while awake for 30 minutes at a time, for at least the first 3-5 days, and then as needed for pain and swelling.  Continue to use ice for pain and swelling. You may notice swelling that will progress down to the foot and ankle.  This is normal after surgery.  Elevate your leg when you are not up walking on it.   °o Continue to use the breathing machine you got in the hospital (incentive spirometer) which will help keep your temperature down.  It is common for your temperature to cycle up and down following surgery, especially at night when you are not up moving around and exerting yourself.  The breathing machine keeps your lungs expanded and your temperature down. ° ° °DIET:  As you were doing prior to hospitalization, we recommend a well-balanced diet. ° °DRESSING / WOUND CARE / SHOWERING ° °Keep the surgical dressing until follow up.  IF THE DRESSING FALLS OFF or the wound gets wet inside, change the dressing with sterile gauze.  Please use good hand washing techniques before changing the dressing.  Do not use any lotions or creams on the incision until instructed by your surgeon.   ° °ACTIVITY ° °o Increase activity slowly as tolerated, but follow the weight bearing instructions below.   °o No driving for 6 weeks or until further direction given by your physician.  You cannot drive while taking narcotics.  °o No lifting or carrying greater than 10 lbs. until further directed by your surgeon. °o Avoid periods of inactivity such as sitting longer than an hour when not asleep. This helps prevent blood clots.  °o You may return to work once you are authorized by your doctor.  ° ° °WEIGHT BEARING  ° °Weight bearing as tolerated with assist device (walker, cane, etc) as directed, use it as long as suggested by your surgeon or therapist, typically  at least 4-6 weeks. ° ° °CONSTIPATION ° °Constipation is defined medically as fewer than three stools per week and severe constipation as less than one stool per week.  Even if you have a regular bowel pattern at home, your normal regimen is likely to be disrupted due to multiple reasons following surgery.  Combination of anesthesia, postoperative narcotics, change in appetite and fluid intake all can affect your bowels.  ° °YOU MUST use at least one of the following options; they are listed in order of increasing strength to get the job done.  They are all available over the counter, and you may need to use some, POSSIBLY even all of these options:   ° °Drink plenty of fluids (prune juice may be helpful) and high fiber foods °Colace 100 mg by mouth twice a day  °Senokot for constipation as directed and as needed Dulcolax (bisacodyl), take with full glass of water  °Miralax (polyethylene glycol) once or twice a day as needed. ° °If you have tried all these things and are unable to have a bowel movement in the first 3-4 days after surgery call either your surgeon or your primary doctor.   ° °If you experience loose stools or diarrhea, hold the medications until you stool forms back up.  If your symptoms do not get better within 1 week or if they get worse, check with your doctor.  If you experience "the worst   abdominal pain ever" or develop nausea or vomiting, please contact the office immediately for further recommendations for treatment. ° ° °ITCHING:  If you experience itching with your medications, try taking only a single pain pill, or even half a pain pill at a time.  You can also use Benadryl over the counter for itching or also to help with sleep.  ° °TED HOSE STOCKINGS:  Use stockings on both legs until for at least 2 weeks or as directed by physician office. They may be removed at night for sleeping. ° °MEDICATIONS:  See your medication summary on the “After Visit Summary” that nursing will review with you.   You may have some home medications which will be placed on hold until you complete the course of blood thinner medication.  It is important for you to complete the blood thinner medication as prescribed. ° °PRECAUTIONS:  If you experience chest pain or shortness of breath - call 911 immediately for transfer to the hospital emergency department.  ° °If you develop a fever greater that 101 F, purulent drainage from wound, increased redness or drainage from wound, foul odor from the wound/dressing, or calf pain - CONTACT YOUR SURGEON.   °                                                °FOLLOW-UP APPOINTMENTS:  If you do not already have a post-op appointment, please call the office for an appointment to be seen by your surgeon.  Guidelines for how soon to be seen are listed in your “After Visit Summary”, but are typically between 1-4 weeks after surgery. ° °MAKE SURE YOU:  °• Understand these instructions.  °• Get help right away if you are not doing well or get worse.  ° ° °Thank you for letting us be a part of your medical care team.  It is a privilege we respect greatly.  We hope these instructions will help you stay on track for a fast and full recovery!  ° °

## 2015-06-16 NOTE — Anesthesia Postprocedure Evaluation (Signed)
Anesthesia Post Note  Patient: Jeanne Smith  Procedure(s) Performed: Procedure(s) (LRB): INTRAMEDULLARY (IM) NAIL FEMORAL (Right)  Anesthesia type: general  Patient location: PACU  Post pain: Pain level controlled  Post assessment: Patient's Cardiovascular Status Stable  Last Vitals:  Filed Vitals:   06/16/15 1430  BP: 152/71  Pulse: 81  Temp: 36.5 C  Resp: 12    Post vital signs: Reviewed and stable  Level of consciousness: sedated  Complications: No apparent anesthesia complications

## 2015-06-17 ENCOUNTER — Encounter (HOSPITAL_COMMUNITY): Payer: Self-pay | Admitting: Orthopedic Surgery

## 2015-06-17 MED ORDER — LISDEXAMFETAMINE DIMESYLATE 30 MG PO CAPS: 50.0000 mg | ORAL_CAPSULE | Freq: Every day | ORAL | Status: DC

## 2015-06-17 MED ORDER — LISDEXAMFETAMINE DIMESYLATE 30 MG PO CAPS
50.0000 mg | ORAL_CAPSULE | Freq: Every day | ORAL | Status: DC
  Administered 2015-06-17 – 2015-06-18 (×3): 50 mg via ORAL
  Filled 2015-06-17 (×4): qty 1

## 2015-06-17 NOTE — Progress Notes (Signed)
     Subjective:  POD#1 IM nail of the R hip. Patient reports pain as mild to moderate.  Resting comfortably in bed.   Objective:   VITALS:   Filed Vitals:   06/16/15 2046 06/17/15 0554 06/17/15 0937 06/17/15 0940  BP: 158/77 140/76 140/76 140/76  Pulse: 101 105 105 105  Temp: 99.1 F (37.3 C) 99.1 F (37.3 C)    TempSrc:      Resp: 18 18    Weight:      SpO2: 94% 94%      Neurologically intact ABD soft Neurovascular intact Sensation intact distally Intact pulses distally Dorsiflexion/Plantar flexion intact Incision: dressing C/D/I   Lab Results  Component Value Date   WBC 7.2 06/16/2015   HGB 13.2 06/16/2015   HCT 39.3 06/16/2015   MCV 99.0 06/16/2015   PLT 175 06/16/2015   BMET    Component Value Date/Time   NA 139 06/15/2015 0616   K 3.8 06/15/2015 0616   CL 106 06/15/2015 0616   CO2 25 06/15/2015 0616   GLUCOSE 127* 06/15/2015 0616   BUN 9 06/15/2015 0616   CREATININE 0.45 06/15/2015 0616   CALCIUM 8.8* 06/15/2015 0616   GFRNONAA >60 06/15/2015 0616   GFRAA >60 06/15/2015 0616     Assessment/Plan: 1 Day Post-Op   Principal Problem:   Intertrochanteric fracture of right femur Active Problems:   Comminuted right humeral fracture   HTN (hypertension)   Up with therapy WBAT in the RLE, NWB in the RUE in sling at all times when out of bed.   ASA 325mg  daily for DVT prophylaxis Will see how the patient does with PT today to better determine if she will require rehab after d/c.  Patient is very insistent that she would like to go home since her daughters are in town, but we will hold on making any decisions at this time.    Harrol Novello Lelan Pons 06/17/2015, 9:54 AM Cell [phone]

## 2015-06-17 NOTE — Evaluation (Signed)
Physical Therapy Evaluation Patient Details Name: Jeanne Smith MRN: [id number] DOB: [birth year] Today's Date: 06/17/2015   History of Present Illness  64 year old female with history of HTN, independent and physically quite active (walks 4 miles 3 times per week), got entangled with her dog leash and fell on hard surface sustaining a right shoulder and right humeral fracture, as well as Nondisplaced right intertrochanteric femur fracture.  Pt is now s/p R IM nailing (06/16/15) with R UE fx being treated non-surgically with use of sling and NWB.   Clinical Impression  Pt admitted with above diagnosis. Pt currently with functional limitations due to the deficits listed below (see PT Problem List). Pt will benefit from skilled PT to increase their independence and safety with mobility to allow discharge to the venue listed below.  Pt moving very well considering multiple fractures.  Jeanne Smith is very motivated to return home and will have 24 hour care available.  Recommend HHPT and hemi-walker.  She may already have a BSC, but is unsure.     Follow Up Recommendations Home health PT    Equipment Recommendations  Other (comment) (hemi-walker, possibly BSC (family checking))    Recommendations for Other Services       Precautions / Restrictions Precautions Required Braces or Orthoses: Sling Restrictions Weight Bearing Restrictions: Yes RUE Weight Bearing: Non weight bearing RLE Weight Bearing: Weight bearing as tolerated      Mobility  Bed Mobility Overal bed mobility: Needs Assistance Bed Mobility: Supine to Sit     Supine to sit: Min assist     General bed mobility comments: MIN A for R LE, used bed rail and increased time to get trunk upright with HOB elevated  Transfers Overall transfer level: Needs assistance   Transfers: Sit to/from Stand Sit to Stand: Min guard         General transfer comment: cues for hand placement  Ambulation/Gait Ambulation/Gait assistance:  Min guard;Min assist Ambulation Distance (Feet): 65 Feet Assistive device: Hemi-walker Gait Pattern/deviations: Step-to pattern;Antalgic Gait velocity: decreased   General Gait Details: Cueing for proper sequence with one episode of slight stumble requiring MIN A, which pt attributes to R UE pain "grabbing" her  Stairs            Wheelchair Mobility    Modified Rankin (Stroke Patients Only)       Balance Overall balance assessment: Needs assistance   Sitting balance-Leahy Scale: Good     Standing balance support: Single extremity supported Standing balance-Leahy Scale: Poor Standing balance comment: requires L UE assist                             Pertinent Vitals/Pain Pain Assessment: 0-10 Pain Score: 6  Pain Location: 6/10 In R shoulder and 3/10 R hip Pain Descriptors / Indicators: Operative site guarding;Aching Pain Intervention(s): Limited activity within patient's tolerance;Premedicated before session    Home Living Family/patient expects to be discharged to:: Private residence Living Arrangements: Alone Available Help at Discharge: Available 24 hours/day;Family Type of Home: House Home Access: Stairs to enter Entrance Stairs-Rails: Psychiatric nurse of Steps: 3 Home Layout: Able to live on main level with bedroom/bathroom Home Equipment: Walker - 2 wheels Additional Comments: may have BSC, checking at mothers    Prior Function Level of Independence: Independent         Comments: Walks several miles a day several days a week     Hand Dominance  Dominant Hand: Right    Extremity/Trunk Assessment   Upper Extremity Assessment: Defer to OT evaluation           Lower Extremity Assessment: RLE deficits/detail RLE Deficits / Details: Limited ROM/strength due to pain    Cervical / Trunk Assessment: Normal  Communication   Communication: No difficulties  Cognition Arousal/Alertness: Awake/alert Behavior During  Therapy: WFL for tasks assessed/performed Overall Cognitive Status: Within Functional Limits for tasks assessed                      General Comments General comments (skin integrity, edema, etc.): Pt is very motivated and cooperative.    Exercises        Assessment/Plan    PT Assessment Patient needs continued PT services  PT Diagnosis Difficulty walking   PT Problem List Decreased strength;Decreased range of motion;Decreased activity tolerance;Decreased balance;Decreased knowledge of use of DME;Decreased mobility  PT Treatment Interventions Gait training;Stair training;Functional mobility training;Therapeutic activities;Therapeutic exercise;DME instruction;Balance training   PT Goals (Current goals can be found in the Care Plan section) Acute Rehab PT Goals Patient Stated Goal: To go home PT Goal Formulation: With patient Time For Goal Achievement: 07/01/15 Potential to Achieve Goals: Good    Frequency Min 3X/week   Barriers to discharge        Co-evaluation               End of Session Equipment Utilized During Treatment: Gait belt Activity Tolerance: Patient tolerated treatment well Patient left: in chair;with call bell/phone within reach Nurse Communication: Mobility status         Time: 3845-3646 PT Time Calculation (min) (ACUTE ONLY): 27 min   Charges:   PT Evaluation $Initial PT Evaluation Tier I: 1 Procedure PT Treatments $Gait Training: 8-22 mins   PT G Codes:        Jeanne Smith 06/17/2015, 10:56 AM

## 2015-06-17 NOTE — Progress Notes (Signed)
PROGRESS NOTE    TIPHANIE VO KNL:[id number] DOB: August 04, 1951 DOA: 06/14/2015 PCP: Elby Showers, MD  HPI/Brief narrative 64 year old female with history of HTN, independent and physically quite active (walks 4 miles 3 times per week), got entangled with her dog leash and fell on hard surface sustaining a right shoulder and right humeral fracture. Orthopedics/Dr. Percell Miller has been consulted-input pending. Patient nothing by mouth for potential surgery.   Assessment/Plan:  Nondisplaced right intertrochanteric femur fracture Comminuted fracture of the right humeral head and nondisplaced fracture of right humeral neck - Orthopedics/Dr. Percell Miller consulted- -R Hip: IM Nail on Wednesday 7/20. R proximal Humerus: Non-operative treatment, Sling when OOB, NWB RUE  - Postop weight-bearing, pain management, DVT prophylaxis and wound care per orthopedic service - Robaxin IV added for muscle spasms. Pain management  Essential hypertension - Controlled on metoprolol  ADHD - On Vyvanse   DVT prophylaxis: Subcutaneous Lovenox Code Status: Full Family Communication: none at bedside Disposition Plan: To be determined.? 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    Consultants:  Orthopedics  Procedures: R Hip: IM Nail on Wednesday 7/20. WBAT RLE post op R proximal Humerus: Non-operative treatment, Sling when OOB, NWB RUE   Antibiotics:  None  Subjective: Sitting in chair, some intermittent dry cough, less hoarseness, No chest pain, dyspnea, palpitations.  Objective: Filed Vitals:   06/16/15 2046 06/17/15 0554 06/17/15 0937 06/17/15 0940  BP: 158/77 140/76 140/76 140/76  Pulse: 101 105 105 105  Temp: 99.1 F (37.3 C) 99.1 F (37.3 C)    TempSrc:      Resp: 18 18    Weight:      SpO2: 94% 94%      Intake/Output Summary (Last 24 hours) at 06/17/15 1113 Last data filed at 06/17/15 7902  Gross per 24 hour    Intake 2117.5 ml  Output   2500 ml  Net -382.5 ml   Filed Weights   06/14/15 2313  Weight: 58.968 kg (130 lb)     Exam:  General exam: Moderately built and nourished pleasant middle-aged female lying comfortably supine in bed. Respiratory system: Clear. No increased work of breathing. Cardiovascular system: S1 & S2 heard, RRR. No JVD, murmurs, gallops, clicks or pedal edema. Gastrointestinal system: Abdomen is nondistended, soft and nontender. Normal bowel sounds heard. Central nervous system: Alert and oriented. No focal neurological deficits. Extremities: Right upper extremity in sling  right lower extremity post op changes. Peripheral pulses symmetrically felt.   Data Reviewed: Basic Metabolic Panel:  Recent Labs Lab 06/15/15 0355 06/15/15 0616  NA 138 139  K 3.9 3.8  CL 106 106  CO2 23 25  GLUCOSE 121* 127*  BUN 10 9  CREATININE 0.44 0.45  CALCIUM 8.8* 8.8*   Liver Function Tests: No results for input(s): AST, ALT, ALKPHOS, BILITOT, PROT, ALBUMIN in the last 168 hours. No results for input(s): LIPASE, AMYLASE in the last 168 hours. No results for input(s): AMMONIA in the last 168 hours. CBC:  Recent Labs Lab 06/15/15 0355 06/15/15 0616 06/16/15 0508  WBC 11.6* 9.6 7.2  NEUTROABS 10.0*  --   --   HGB 13.7 13.6 13.2  HCT 39.8 39.9 39.3  MCV 97.3 96.4 99.0  PLT 234 196 175   Cardiac Enzymes: No results for input(s): CKTOTAL, CKMB, CKMBINDEX, TROPONINI in the last 168 hours. BNP (last 3 results) No results for input(s): PROBNP in the last 8760  hours. CBG: No results for input(s): GLUCAP in the last 168 hours.  Recent Results (from the past 240 hour(s))  Surgical pcr screen     Status: None   Collection Time: 06/15/15  5:29 AM  Result Value Ref Range Status   MRSA, PCR NEGATIVE NEGATIVE Final   Staphylococcus aureus NEGATIVE NEGATIVE Final    Comment:        The Xpert SA Assay (FDA approved for NASAL specimens in patients over 21 years of  age), is one component of a comprehensive surveillance program.  Test performance has been validated by [facility] for patients greater than or equal to 37 year old. It is not intended to diagnose infection nor to guide or monitor treatment.      EKG: 06/15/15: Sinus rhythm with PACs.? RSR pattern/? Intraventricular conduction abnormality in V1-V2 and no acute changes.    Studies: Pelvis Portable  06/16/2015   CLINICAL DATA:  Status post ORIF of proximal right femoral fracture  EXAM: PORTABLE PELVIS 1-2 VIEWS  COMPARISON:  Films from earlier in the same day  FINDINGS: Medullary rod is now seen with compression screw extending into the femoral head on the right. No acute abnormality is noted. Mild degenerative changes of the hip joints are seen.  IMPRESSION: Status post ORIF of proximal right femoral fracture.   Electronically Signed   By: Inez Catalina M.D.   On: 06/16/2015 14:18   Dg C-arm 1-60 Min  06/16/2015   CLINICAL DATA:  Right hip intertrochanteric fracture, status post right hip ORIF  EXAM: RIGHT FEMUR 2 VIEWS; DG C-ARM 61-120 MIN  COMPARISON:  06/15/2015  FINDINGS: 3 spot fluoroscopic intraoperative views of the right femur demonstrate intra medullary rod insertion and fixation screw for the nondisplaced right intertrochanteric fracture. No malalignment or hardware abnormality. Expected postoperative appearance.  IMPRESSION: Status post right hip ORIF for a nondisplaced right hip intertrochanteric fracture   Electronically Signed   By: Jerilynn Mages.  Shick M.D.   On: 06/16/2015 13:16   Dg Femur, Min 2 Views Right  06/16/2015   CLINICAL DATA:  Right hip intertrochanteric fracture, status post right hip ORIF  EXAM: RIGHT FEMUR 2 VIEWS; DG C-ARM 61-120 MIN  COMPARISON:  06/15/2015  FINDINGS: 3 spot fluoroscopic intraoperative views of the right femur demonstrate intra medullary rod insertion and fixation screw for the nondisplaced right intertrochanteric fracture. No malalignment or hardware  abnormality. Expected postoperative appearance.  IMPRESSION: Status post right hip ORIF for a nondisplaced right hip intertrochanteric fracture   Electronically Signed   By: Jerilynn Mages.  Shick M.D.   On: 06/16/2015 13:16        Scheduled Meds: . aspirin EC  325 mg Oral Q breakfast  . enoxaparin (LOVENOX) injection  40 mg Subcutaneous Q24H  . lisdexamfetamine  50 mg Oral Daily  . metoprolol  2.5 mg Intravenous Once  . metoprolol succinate  50 mg Oral Daily   Continuous Infusions:   Principal Problem:   Intertrochanteric fracture of right femur Active Problems:   Comminuted right humeral fracture   HTN (hypertension)    Time spent: 25 minutes    Kailin Principato, MD, PhD [facility] Pager [phone]  If 7PM-7AM, please contact night-coverage [email or web address] Password [email or web address] 06/17/2015, 11:13 AM    LOS: 2 days

## 2015-06-17 NOTE — Progress Notes (Signed)
Utilization review completed.  

## 2015-06-17 NOTE — Progress Notes (Signed)
Occupational Therapy Evaluation Patient Details Name: Jeanne Smith MRN: [id number] DOB: 10-17-1951 Today's Date: 06/17/2015    History of Present Illness 64 year old female with history of HTN, independent and physically quite active (walks 4 miles 3 times per week), got entangled with her dog leash and fell on hard surface sustaining a right shoulder and right humeral fracture, as well as Nondisplaced right intertrochanteric femur fracture.  Pt is now s/p R IM nailing (06/16/15) with R UE fx being treated non-surgically with use of sling and NWB.    Clinical Impression   Pt admitted with the above diagnoses and presents with below problem list. Pt will benefit from continued OT to address the below listed deficits and maximize independence with BADLs prior to d/c home with family assisting. PTA pt was independent and very active. Pt is currently min A for most ADLs, min guard for transfers and functional mobility. ADLs completed and education provided as detailed below. Awaiting clarification on whether pt can remove sling for bathing/dressing. OT to continue to follow acutely and recommend [facility].     Follow Up Recommendations  Home health OT;Supervision/Assistance - 24 hour    Equipment Recommendations  3 in 1 bedside comode;Other (comment) (Pt is checking to see if family has one.)    Recommendations for Other Services       Precautions / Restrictions Precautions Precautions: Other (comment) (NWB RUE) Required Braces or Orthoses: Sling Restrictions Weight Bearing Restrictions: Yes RUE Weight Bearing: Non weight bearing RLE Weight Bearing: Weight bearing as tolerated      Mobility Bed Mobility Overal bed mobility: Needs Assistance Bed Mobility: Supine to Sit     Supine to sit: Min assist     General bed mobility comments: in recliner  Transfers Overall transfer level: Needs assistance Equipment used: Hemi-walker Transfers: Sit to/from Stand Sit to Stand: Min  guard         General transfer comment: from recliner and 3n1. cues for technique. close min guard for safety.    Balance Overall balance assessment: Needs assistance   Sitting balance-Leahy Scale: Good     Standing balance support: Single extremity supported;During functional activity Standing balance-Leahy Scale: Poor Standing balance comment: hemiwalker for balance                            ADL Overall ADL's : Needs assistance/impaired Eating/Feeding: Set up;Minimal assistance;Sitting   Grooming: Set up;Minimal assistance;Sitting   Upper Body Bathing: Minimal assitance;Sitting   Lower Body Bathing: Sit to/from stand;Minimal assistance   Upper Body Dressing : Set up;Minimal assistance;Sitting   Lower Body Dressing: Min guard;Minimal assistance;Sit to/from stand   Toilet Transfer: Min guard;Ambulation (hemi-walker, 3n1 over toilet)   Toileting- Clothing Manipulation and Hygiene: Min guard;Minimal assistance;Sit to/from stand   Tub/ Shower Transfer: Min guard;Ambulation;3 in 1 (hemiwalker)   Functional mobility during ADLs: Min guard (hemiwalker) General ADL Comments: Pt ambulated to/from bathroom and completed toilet transfer as detailed above. ADL education provided. Awaiting clarifcation from MD on whether pt can remove sling for bathing/dressing. Instructed in conservative strategies assuming pt remains in sling until she hears otherwise.      Vision     Perception     Praxis      Pertinent Vitals/Pain Pain Assessment: 0-10 Pain Score: 5  Pain Location: "it (RUE) smarts" Pain Descriptors / Indicators: Aching Pain Intervention(s): Limited activity within patient's tolerance;Monitored during session;Repositioned     Hand Dominance Right   Extremity/Trunk Assessment  Upper Extremity Assessment Upper Extremity Assessment: RUE deficits/detail RUE Deficits / Details: right humeral fracture, non-operative approach RUE: Unable to fully assess due  to immobilization RUE Coordination: decreased gross motor   Lower Extremity Assessment Lower Extremity Assessment: Defer to PT evaluation RLE Deficits / Details: Limited ROM/strength due to pain RLE: Unable to fully assess due to pain   Cervical / Trunk Assessment Cervical / Trunk Assessment: Normal   Communication Communication Communication: No difficulties   Cognition Arousal/Alertness: Awake/alert Behavior During Therapy: WFL for tasks assessed/performed Overall Cognitive Status: Within Functional Limits for tasks assessed                     General Comments       Exercises       Shoulder Instructions      Home Living Family/patient expects to be discharged to:: Private residence Living Arrangements: Alone Available Help at Discharge: Available 24 hours/day;Family Type of Home: House Home Access: Stairs to enter Technical brewer of Steps: 3 Entrance Stairs-Rails: Right;Left Home Layout: Able to live on main level with bedroom/bathroom     Bathroom Shower/Tub: Occupational psychologist: Handicapped height     Home Equipment: Environmental consultant - 2 wheels   Additional Comments: may have BSC, checking at mothers      Prior Functioning/Environment Level of Independence: Independent        Comments: Walks several miles a day several days a week    OT Diagnosis: Acute pain   OT Problem List: Decreased range of motion;Impaired balance (sitting and/or standing);Decreased knowledge of use of DME or AE;Decreased knowledge of precautions;Impaired UE functional use;Pain   OT Treatment/Interventions: Self-care/ADL training;DME and/or AE instruction;Therapeutic activities;Patient/family education;Balance training    OT Goals(Current goals can be found in the care plan section) Acute Rehab OT Goals Patient Stated Goal: To go home OT Goal Formulation: With patient Time For Goal Achievement: 06/24/15 Potential to Achieve Goals: Good ADL Goals Pt Will  Perform Grooming: sitting;with modified independence;with adaptive equipment Pt Will Perform Upper Body Bathing: with modified independence;with adaptive equipment;sitting Pt Will Perform Lower Body Bathing: with modified independence;with adaptive equipment;sit to/from stand Pt Will Perform Upper Body Dressing: with modified independence;with adaptive equipment;sitting Pt Will Perform Lower Body Dressing: with modified independence;with adaptive equipment;sit to/from stand Pt Will Transfer to Toilet: with modified independence;ambulating (3n1 over toilet) Pt Will Perform Toileting - Clothing Manipulation and hygiene: with modified independence;sit to/from stand;with adaptive equipment Pt Will Perform Tub/Shower Transfer: Shower transfer;ambulating;3 in 1 (hemiwalker) Additional ADL Goal #1: Pt will complete bed mobility at mod I level to prepare for OOB ADLs.  OT Frequency: Min 2X/week   Barriers to D/C:            Co-evaluation              End of Session Equipment Utilized During Treatment: Gait belt;Other (comment) (hemiwalker, sling)  Activity Tolerance: Patient tolerated treatment well Patient left: in chair;with call bell/phone within reach   Time: 2197-5883 OT Time Calculation (min): 25 min Charges:  OT General Charges $OT Visit: 1 Procedure OT Evaluation $Initial OT Evaluation Tier I: 1 Procedure OT Treatments $Self Care/Home Management : 8-22 mins G-Codes:    Hortencia Pilar Jun 21, 2015, 2:07 PM

## 2015-06-18 LAB — BASIC METABOLIC PANEL
ANION GAP: 8 (ref 5–15)
CALCIUM: 8.6 mg/dL — AB (ref 8.9–10.3)
CO2: 28 mmol/L (ref 22–32)
Chloride: 99 mmol/L — ABNORMAL LOW (ref 101–111)
Creatinine, Ser: 0.54 mg/dL (ref 0.44–1.00)
GFR calc Af Amer: >60 mL/min (ref >60)
Glucose, Bld: 126 mg/dL — ABNORMAL HIGH (ref 65–99)
Potassium: 3.6 mmol/L (ref 3.5–5.1)
Sodium: 135 mmol/L (ref 135–145)

## 2015-06-18 LAB — CBC
HEMATOCRIT: 32.3 % — AB (ref 36.0–46.0)
Hemoglobin: 10.8 g/dL — ABNORMAL LOW (ref 12.0–15.0)
MCH: 32.3 pg (ref 26.0–34.0)
MCHC: 33.4 g/dL (ref 30.0–36.0)
MCV: 96.7 fL (ref 78.0–100.0)
PLATELETS: 173 10*3/uL (ref 150–400)
RBC: 3.34 MIL/uL — ABNORMAL LOW (ref 3.87–5.11)
RDW: 12.8 % (ref 11.5–15.5)
WBC: 6 10*3/uL (ref 4.0–10.5)

## 2015-06-18 MED ORDER — POLYETHYLENE GLYCOL 3350 17 G PO PACK: 17.0000 g | PACK | Freq: Every day | ORAL | Status: DC

## 2015-06-18 MED ORDER — SENNOSIDES-DOCUSATE SODIUM 8.6-50 MG PO TABS: 2.0000 | ORAL_TABLET | Freq: Two times a day (BID) | ORAL | Status: DC

## 2015-06-18 MED ORDER — SENNOSIDES-DOCUSATE SODIUM 8.6-50 MG PO TABS: 2.0000 | ORAL_TABLET | Freq: Every day | ORAL | Status: DC

## 2015-06-18 NOTE — Care Management Note (Signed)
Case Management Note  Patient Details  Name: Jeanne Smith MRN: [id number] Date of Birth: 01-Dec-1950  Subjective/Objective:              S/p IM nail right femur      Action/Plan: Spoke with patient about home health, she selected [facility]. Patient states that she has a hemi walker and a 3N1 at home and will have family available to assist after discharge. [facility] and [facility] HH[facility].     Expected Discharge Date:                  Expected Discharge Plan:  [place]  In-House Referral:  NA  Discharge planning Services  CM Consult  Post Acute Care Choice:  Home Health Choice offered to:  Patient  DME Arranged:    DME Agency:     HH Arranged:  PT [place]:  [place]  Status of Service:  Completed, signed off  Medicare Important Message Given:    Date Medicare IM Given:    Medicare IM give by:    Date Additional Medicare IM Given:    Additional Medicare Important Message give by:     If discussed at [place] of Stay Meetings, dates discussed:    Additional Comments:  Nila Nephew, RN 06/18/2015, 3:00 PM

## 2015-06-18 NOTE — Progress Notes (Signed)
     Subjective:  POD#2 IM Nail of the R hip. Patient reports pain as moderate.  Patient was able to tolerate ambulation with PT/OT yesterday using hemi walker.  Patient has not had BM since Monday.   Clarification on sling restrictions:  Patient should have sling on when out of bed, but it can be removed for bathing/dressing as long as motion in the R arm is limited.  Objective:   VITALS:   Filed Vitals:   06/17/15 0937 06/17/15 0940 06/17/15 1439 06/17/15 2117  BP: 140/76 140/76 114/57 128/63  Pulse: 105 105 69 113  Temp:   97.8 F (36.6 C) 98.8 F (37.1 C)  TempSrc:    Oral  Resp:   18 18  Weight:      SpO2:   94% 95%    Neurologically intact ABD soft Neurovascular intact Sensation intact distally Intact pulses distally Dorsiflexion/Plantar flexion intact Incision: dressing C/D/I   Lab Results  Component Value Date   WBC 6.0 06/18/2015   HGB 10.8* 06/18/2015   HCT 32.3* 06/18/2015   MCV 96.7 06/18/2015   PLT 173 06/18/2015   BMET    Component Value Date/Time   NA 135 06/18/2015 0348   K 3.6 06/18/2015 0348   CL 99* 06/18/2015 0348   CO2 28 06/18/2015 0348   GLUCOSE 126* 06/18/2015 0348   BUN <5* 06/18/2015 0348   CREATININE 0.54 06/18/2015 0348   CALCIUM 8.6* 06/18/2015 0348   GFRNONAA >60 06/18/2015 0348   GFRAA >60 06/18/2015 0348     Assessment/Plan: 2 Days Post-Op   Principal Problem:   Intertrochanteric fracture of right femur Active Problems:   Comminuted right humeral fracture   HTN (hypertension)   Up with therapy WBAT in the RLE, NWB in sling when out of bed except for bathing/toileting in RUE ASA 325mg  daily for DVT prophylaxis Patient would like to go home.  Worked well with PT yesterday and this afternoon.  Family is comfortable with discharge home.  Cleared from ortho standpoint once she has a BM.    Devinne Epstein Lelan Pons 06/18/2015, 5:22 AM Cell [phone]

## 2015-06-18 NOTE — Progress Notes (Addendum)
Occupational Therapy Treatment Patient Details Name: Jeanne Smith MRN: [id number] DOB: [birth year] Today's Date: 06/18/2015    History of present illness 64 year old female with history of HTN, independent and physically quite active (walks 4 miles 3 times per week), got entangled with her dog leash and fell on hard surface sustaining a right shoulder and right humeral fracture, as well as Nondisplaced right intertrochanteric femur fracture.  Pt is now s/p R IM nailing (06/16/15) with R UE fx being treated non-surgically with use of sling and NWB.    OT comments  Pt and daughter feel comfortable with all ADL education and they do not feel they need HHOT so informed case manager. They also have a 3in1 already. Daughter practiced hands on with donning/doffing sling and assisting with all dressing tasks. Pt needs some cues for safety with hemi-walker. Discussed proper positioning for sleep and when sitting in chair. Emphasized need to wear sling except for bathing and dressing. Nurse paged MD regarding sling wear and per MD today, pt allowed to have sling off for bathing/dressing only.     Follow Up Recommendations  Supervision/Assistance - 24 hour;No OT follow up    Equipment Recommendations  None recommended by OT (pt has 3in1)    Recommendations for Other Services      Precautions / Restrictions Precautions Precautions: Other (comment);Shoulder Shoulder Interventions: Shoulder sling/immobilizer;Off for dressing/bathing Required Braces or Orthoses: Sling Restrictions Weight Bearing Restrictions: Yes RUE Weight Bearing: Non weight bearing RLE Weight Bearing: Weight bearing as tolerated      Mobility Bed Mobility               General bed mobility comments: in chair.   Transfers Overall transfer level: Needs assistance Equipment used: Hemi-walker Transfers: Sit to/from Stand Sit to Stand: Min guard         General transfer comment: cues for hand placement.      Balance                                   ADL                   Upper Body Dressing : Moderate assistance;Sitting including sling.        Toilet Transfer: Min guard;Ambulation   Toileting- Clothing Manipulation and Hygiene: Minimal assistance;Sit to/from stand         General ADL Comments: Daughter assisted pt with LB dressing today and started clothing over LEs for her. Min assist to pull up clothing on R side. Pt did not have a button down shirt so donned pj shirt over L UE and then draped over top of sling for R UE. Emphasized wearing button down shirts for home. Pt has a fully walk in shower so she can use the hemi walker to walk right into shower to the 3in1 and sit and bathe. Practiced 3in1 transfer and pt needed min guard assist and cues for hand placement and safety with hemi walker. Instructed daughter on how to assist with UB bathing and dressing including sling wear. Daughter practiced donning and doffing sling.       Vision                     Perception     Praxis      Cognition   Behavior During Therapy: [facility] for tasks assessed/performed Overall Cognitive Status: Within Functional Limits for tasks assessed  Extremity/Trunk Assessment               Exercises     Shoulder Instructions       General Comments      Pertinent Vitals/ Pain       Pain Assessment: 0-10 Pain Score: 5  Faces Pain Scale: Hurts little more Pain Location: R UE Pain Descriptors / Indicators: Aching Pain Intervention(s): Repositioned  Home Living                                          Prior Functioning/Environment              Frequency Min 2X/week     Progress Toward Goals  OT Goals(current goals can now be found in the care plan section)  Progress towards OT goals: Progressing toward goals     Plan Discharge plan needs to be updated    Co-evaluation                 End  of Session Equipment Utilized During Treatment: Other (comment) (hemi walker)   Activity Tolerance Patient tolerated treatment well   Patient Left in chair;with call bell/phone within reach   Nurse Communication          Time: 1347-1410 OT Time Calculation (min): 23 min  Charges: OT General Charges $OT Visit: 1 Procedure OT Treatments $Self Care/Home Management : 8-22 mins $Therapeutic Activity: 8-22 mins  Jules Schick  [phone] 06/18/2015, 2:21 PM

## 2015-06-18 NOTE — Progress Notes (Signed)
Physical Therapy Treatment Patient Details Name: Jeanne Smith MRN: [id number] DOB: July 25, 1951 Today's Date: 06/18/2015    History of Present Illness 64 year old female with history of HTN, independent and physically quite active (walks 4 miles 3 times per week), got entangled with her dog leash and fell on hard surface sustaining a right shoulder and right humeral fracture, as well as Nondisplaced right intertrochanteric femur fracture.  Pt is now s/p R IM nailing (06/16/15) with R UE fx being treated non-surgically with use of sling and NWB.     PT Comments    Pt able to ambulate stairs with Min G for safety. Pt tends to forget about safety and will let go of her hemi-walker. Educated on safety with family present.  Follow Up Recommendations  Home health PT     Equipment Recommendations  Other (comment)    Recommendations for Other Services       Precautions / Restrictions Restrictions Weight Bearing Restrictions: Yes RUE Weight Bearing: Non weight bearing RLE Weight Bearing: Weight bearing as tolerated    Mobility  Bed Mobility               General bed mobility comments: Pt found in recliner and returned to recliner after session.  Transfers Overall transfer level: Needs assistance Equipment used: Hemi-walker Transfers: Sit to/from Stand Sit to Stand: Min guard         General transfer comment: Cues for hand placement. Min G for safety.  Ambulation/Gait Ambulation/Gait assistance: Min guard Ambulation Distance (Feet): 175 Feet Assistive device: Hemi-walker Gait Pattern/deviations: Step-to pattern   Gait velocity interpretation: Below normal speed for age/gender General Gait Details: Pt tends to let go of hemi-walker. Instructed patient on safety with hemi-walker.   Stairs Stairs: Yes Stairs assistance: Min guard Stair Management: One rail Right;Sideways Number of Stairs: 4 General stair comments: Cues for hand placement and sequence. Min G for  safety.  Wheelchair Mobility    Modified Rankin (Stroke Patients Only)       Balance                                    Cognition Arousal/Alertness: Awake/alert Behavior During Therapy: WFL for tasks assessed/performed Overall Cognitive Status: Within Functional Limits for tasks assessed                      Exercises      General Comments        Pertinent Vitals/Pain Pain Assessment: Faces Faces Pain Scale: Hurts little more Pain Location: RUE more so than R hip Pain Descriptors / Indicators: Aching Pain Intervention(s): Monitored during session;Repositioned    Home Living                      Prior Function            PT Goals (current goals can now be found in the care plan section) Progress towards PT goals: Progressing toward goals    Frequency  Min 3X/week    PT Plan Current plan remains appropriate    Co-evaluation             End of Session Equipment Utilized During Treatment: Gait belt Activity Tolerance: Patient tolerated treatment well Patient left: in chair;with call bell/phone within reach;with family/visitor present     Time: 5732-2025 PT Time Calculation (min) (ACUTE ONLY): 19 min  Charges:  G CodesAllyn Kenner, SPTA 2015-06-23, 1:48 PM

## 2015-06-18 NOTE — Progress Notes (Signed)
PT Cancellation Note  Patient Details Name: Jeanne Smith MRN: [id number] DOB: [birth year]   Cancelled Treatment:    Reason Eval/Treat Not Completed: Pain limiting ability to participate. Patient is requesting to hold PT at this time and to return after lunch and more pain meds. Will follow up this afternoon.    Jacqualyn Posey 06/18/2015, 11:18 AM

## 2015-06-18 NOTE — Discharge Summary (Signed)
Discharge Summary  Jeanne Smith NID:[id number] DOB: 10-14-1951  PCP: Elby Showers, MD  Admit date: 06/14/2015 Discharge date: 06/18/2015  Time spent: <49mins  Recommendations for Outpatient Follow-up:  1. F/u with PMD with in two weeks for hospital discharge follow up 2. F/u with orthopedics  Discharge Diagnoses:  Active Hospital Problems   Diagnosis Date Noted  . Intertrochanteric fracture of right femur 06/15/2015  . Comminuted right humeral fracture 06/15/2015  . HTN (hypertension) 06/15/2015    Resolved Hospital Problems   Diagnosis Date Noted Date Resolved  No resolved problems to display.    Discharge Condition: stable  Diet recommendation: heart healthy  Filed Weights   06/14/15 2313  Weight: 58.968 kg (130 lb)    History of present illness:  [name] was walking her daughter's dog a black lab. The labs saw another family individual anticough running. The leash was wrapped her in the patient's right wrist and she fell on hard concrete. She is not able to get up. She was taken straight to the ER. She has history of hypertension but no other medical issues. She denies any history of chest pain, history of congestive heart failure. She does offer housework. History provided by the patient  Hospital Course:  Principal Problem:   Intertrochanteric fracture of right femur Active Problems:   Comminuted right humeral fracture   HTN (hypertension)  Nondisplaced right intertrochanteric femur fracture Comminuted fracture of the right humeral head and nondisplaced fracture of right humeral neck - Orthopedics/Dr. Percell Miller consulted- -R Hip: IM Nail on Wednesday 7/20. WBAT in the RLE, R proximal Humerus: Non-operative treatment, NWB in sling when out of bed except for bathing/toileting in RUE  - Postop weight-bearing, pain management, DVT prophylaxis and wound care per orthopedic service - Robaxin IV added for muscle spasms. Pain  management  Essential hypertension - Controlled on metoprolol  ADHD - On Vyvanse   DVT prophylaxis: ortho recommended asa 325 qd Code Status: Full Family Communication: none at bedside Disposition Plan:  Declined  SNF , requested to be discharged home, home health arranged.    Consultants:  Orthopedics  Procedures: R Hip: IM Nail on Wednesday 7/20. WBAT RLE post op R proximal Humerus: Non-operative treatment, NWB in sling when out of bed except for bathing/toileting in RUE   Antibiotics:  None  Discharge Exam: BP 134/62 mmHg  Pulse 100  Temp(Src) 98.2 F (36.8 C) (Oral)  Resp 16  Wt 58.968 kg (130 lb)  SpO2 99%  General exam: Moderately built and nourished pleasant middle-aged female lying comfortably supine in bed. Respiratory system: Clear. No increased work of breathing. Cardiovascular system: S1 & S2 heard, RRR. No JVD, murmurs, gallops, clicks or pedal edema. Gastrointestinal system: Abdomen is nondistended, soft and nontender. Normal bowel sounds heard. Central nervous system: Alert and oriented. No focal neurological deficits. Extremities: Right upper extremity in sling right lower extremity post op changes. Peripheral pulses symmetrically felt.    Discharge Instructions You were cared for by a hospitalist during your hospital stay. If you have any questions about your discharge medications or the care you received while you were in the hospital after you are discharged, you can call the unit and asked to speak with the hospitalist on call if the hospitalist that took care of you is not available. Once you are discharged, your primary care physician will handle any further medical issues. Please note that NO REFILLS for any discharge medications will be authorized once you are discharged, as it  is imperative that you return to your primary care physician (or establish a relationship with a primary care physician if you do not have one) for your aftercare needs  so that they can reassess your need for medications and monitor your lab values.  Discharge Instructions    Diet - low sodium heart healthy    Complete by:  As directed      Increase activity slowly    Complete by:  As directed      Weight bearing as tolerated    Complete by:  As directed   Laterality:  right  Extremity:  Lower            Medication List    STOP taking these medications        metoprolol 50 MG tablet  Commonly known as:  LOPRESSOR      TAKE these medications        aspirin 325 MG tablet  Take 1 tablet (325 mg total) by mouth daily.     b complex vitamins tablet  Take 1 tablet by mouth daily.     docusate sodium 100 MG capsule  Commonly known as:  COLACE  Take 1 capsule (100 mg total) by mouth 2 (two) times daily.     HYDROcodone-acetaminophen 5-325 MG per tablet  Commonly known as:  NORCO  Take 1-2 tablets by mouth every 6 (six) hours as needed for moderate pain.     lisdexamfetamine 50 MG capsule  Commonly known as:  VYVANSE  Take 50 mg by mouth daily.     MAGNESIUM PO  Take 1 tablet by mouth daily.     methocarbamol 500 MG tablet  Commonly known as:  ROBAXIN  Take 1 tablet (500 mg total) by mouth 4 (four) times daily.     metoprolol succinate 50 MG 24 hr tablet  Commonly known as:  TOPROL-XL  TAKE 1 TABLET EVERY DAY     multivitamin with minerals Tabs tablet  Take 1 tablet by mouth daily.     ondansetron 4 MG tablet  Commonly known as:  ZOFRAN  Take 1 tablet (4 mg total) by mouth every 8 (eight) hours as needed for nausea or vomiting.     senna-docusate 8.6-50 MG per tablet  Commonly known as:  Senokot-S  Take 2 tablets by mouth at bedtime.       No Known Allergies     Follow-up Information    Follow up with MURPHY, TIMOTHY D, MD In 1 week.   Specialty:  Orthopedic Surgery   Contact information:   [address] [place] [address] [phone]       Follow up with [facility].    Why:  They will contact you to schedule home therapy visits.   Contact information:   [address] [place] [address] [phone]        The results of significant diagnostics from this hospitalization (including imaging, microbiology, ancillary and laboratory) are listed below for reference.    Significant Diagnostic Studies: Dg Shoulder Right  06/15/2015   CLINICAL DATA:  64 year old female with fall and right shoulder pain.  EXAM: RIGHT SHOULDER - 2+ VIEW  COMPARISON:  None.  FINDINGS: There is comminuted appearing fracture of the right humeral head with involvement of the greater tuberosity. There is possible extension of the fracture to the humeral neck. A small displaced fracture fragment noted lateral to the humeral head. There is anatomic alignment of the humeral  head with the glenoid fossa.  IMPRESSION: Comminuted fracture of the right humeral head with possible extension to the neck of the humerus.   Electronically Signed   By: Anner Crete M.D.   On: 06/15/2015 01:36   Ct Pelvis Wo Contrast  06/15/2015   CLINICAL DATA:  [place] around 2230 hours yesterday while walking dog, pain increases with weight-bearing.  EXAM: CT PELVIS WITHOUT CONTRAST  TECHNIQUE: Multidetector CT imaging of the pelvis was performed following the standard protocol without intravenous contrast.  COMPARISON:  RIGHT hip radiograph June 15, 2015 at 0041 hours  FINDINGS: Faint linear RIGHT intertrochanteric lucency concerning for nondisplaced fracture (axial 62 - 69 of 85). Femoral heads are well formed, moderate to severe LEFT, moderate RIGHT hip joint space narrowing, periarticular sclerosis and femoral head spurring consistent with osteoarthrosis. No dislocation.  Moderate calcific atherosclerosis of the imaged aortoiliac vessels. Status post hysterectomy. Urinary bladder is well distended unremarkable. Phleboliths in the pelvis. RIGHT hip subcutaneous fat stranding without subcutaneous gas or radiopaque  foreign bodies. Osteopenia without destructive bony lesions.  IMPRESSION: Suspected nondisplaced RIGHT femur intertrochanteric fracture, though MRI would be more sensitive. No dislocation.  RIGHT hip soft tissue swelling/contusion.  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   Electronically Signed   By: Elon Alas M.D.   On: 06/15/2015 03:03   Ct Shoulder Right Wo Contrast  06/15/2015   CLINICAL DATA:  64 year old female with trauma to the right shoulder.  EXAM: CT OF THE RIGHT SHOULDER WITHOUT CONTRAST  TECHNIQUE: Multidetector CT imaging was performed according to the standard protocol. Multiplanar CT image reconstructions were also generated.  COMPARISON:  Radiograph dated 05/1915  FINDINGS: There is comminuted fracture of the right humeral head with fracture of the greater tuberosity. There is nondisplaced fracture of the neck of the right humerus. There is a small displaced cortical fracture fragment from the posterior humeral head. The humeral head remains in anatomic articulation with the glenoid fossa. There is soft tissue swelling with small intermuscular hematoma.  IMPRESSION: Comminuted fracture of the right humeral hand mid nondisplaced fracture of neck of the humerus. No dislocation.   Electronically Signed   By: Anner Crete M.D.   On: 06/15/2015 03:00   Pelvis Portable  06/16/2015   CLINICAL DATA:  Status post ORIF of proximal right femoral fracture  EXAM: PORTABLE PELVIS 1-2 VIEWS  COMPARISON:  Films from earlier in the same day  FINDINGS: Medullary rod is now seen with compression screw extending into the femoral head on the right. No acute abnormality is noted. Mild degenerative changes of the hip joints are seen.  IMPRESSION: Status post ORIF of proximal right femoral fracture.   Electronically Signed   By: Inez Catalina M.D.   On: 06/16/2015 14:18   Dg Chest Port 1 View  06/15/2015   CLINICAL DATA:  Preoperative chest radiograph for right hip fracture.  EXAM:  PORTABLE CHEST - 1 VIEW  COMPARISON:  None.  FINDINGS: The lungs are well-aerated and clear. There is no evidence of focal opacification, pleural effusion or pneumothorax.  The cardiomediastinal silhouette is within normal limits. No acute osseous abnormalities are seen. Bilateral breast implants are noted.  IMPRESSION: No acute cardiopulmonary process seen.   Electronically Signed   By: Garald Balding M.D.   On: 06/15/2015 05:55   Dg C-arm 1-60 Min  06/16/2015   CLINICAL DATA:  Right hip intertrochanteric fracture, status post right hip ORIF  EXAM: RIGHT FEMUR 2 VIEWS; DG C-ARM 61-120 MIN  COMPARISON:  06/15/2015  FINDINGS: 3 spot fluoroscopic intraoperative views of the right femur demonstrate intra medullary rod insertion and fixation screw for the nondisplaced right intertrochanteric fracture. No malalignment or hardware abnormality. Expected postoperative appearance.  IMPRESSION: Status post right hip ORIF for a nondisplaced right hip intertrochanteric fracture   Electronically Signed   By: Jerilynn Mages.  Shick M.D.   On: 06/16/2015 13:16   Dg Hip Unilat With Pelvis 2-3 Views Right  06/15/2015   CLINICAL DATA:  [place] onto right side while walking dog, with right anterior hip pain. Initial encounter.  EXAM: DG HIP (WITH OR WITHOUT PELVIS) 2-3V RIGHT  COMPARISON:  None.  FINDINGS: There is no evidence of fracture or dislocation. Both femoral heads are seated normally within their respective acetabula. Minimal joint space narrowing is noted at the left hip. The proximal right femur appears intact. No significant degenerative change is appreciated. The sacroiliac joints are unremarkable in appearance.  The visualized bowel gas pattern is grossly unremarkable in appearance.  IMPRESSION: No evidence of fracture or dislocation.   Electronically Signed   By: Garald Balding M.D.   On: 06/15/2015 01:37   Dg Femur, Min 2 Views Right  06/16/2015   CLINICAL DATA:  Right hip intertrochanteric fracture, status post right hip  ORIF  EXAM: RIGHT FEMUR 2 VIEWS; DG C-ARM 61-120 MIN  COMPARISON:  06/15/2015  FINDINGS: 3 spot fluoroscopic intraoperative views of the right femur demonstrate intra medullary rod insertion and fixation screw for the nondisplaced right intertrochanteric fracture. No malalignment or hardware abnormality. Expected postoperative appearance.  IMPRESSION: Status post right hip ORIF for a nondisplaced right hip intertrochanteric fracture   Electronically Signed   By: Jerilynn Mages.  Shick M.D.   On: 06/16/2015 13:16    Microbiology: Recent Results (from the past 240 hour(s))  Surgical pcr screen     Status: None   Collection Time: 06/15/15  5:29 AM  Result Value Ref Range Status   MRSA, PCR NEGATIVE NEGATIVE Final   Staphylococcus aureus NEGATIVE NEGATIVE Final    Comment:        The Xpert SA Assay (FDA approved for NASAL specimens in patients over 17 years of age), is one component of a comprehensive surveillance program.  Test performance has been validated by [facility] for patients greater than or equal to 49 year old. It is not intended to diagnose infection nor to guide or monitor treatment.      Labs: Basic Metabolic Panel:  Recent Labs Lab 06/15/15 0355 06/15/15 0616 06/18/15 0348  NA 138 139 135  K 3.9 3.8 3.6  CL 106 106 99*  CO2 23 25 28   GLUCOSE 121* 127* 126*  BUN 10 9 <5*  CREATININE 0.44 0.45 0.54  CALCIUM 8.8* 8.8* 8.6*   Liver Function Tests: No results for input(s): AST, ALT, ALKPHOS, BILITOT, PROT, ALBUMIN in the last 168 hours. No results for input(s): LIPASE, AMYLASE in the last 168 hours. No results for input(s): AMMONIA in the last 168 hours. CBC:  Recent Labs Lab 06/15/15 0355 06/15/15 0616 06/16/15 0508 06/18/15 0348  WBC 11.6* 9.6 7.2 6.0  NEUTROABS 10.0*  --   --   --   HGB 13.7 13.6 13.2 10.8*  HCT 39.8 39.9 39.3 32.3*  MCV 97.3 96.4 99.0 96.7  PLT 234 196 175 173   Cardiac Enzymes: No results for input(s): CKTOTAL, CKMB, CKMBINDEX, TROPONINI  in the last 168 hours. BNP: BNP (last 3 results) No results for input(s): BNP in the last 8760  hours.  ProBNP (last 3 results) No results for input(s): PROBNP in the last 8760 hours.  CBG: No results for input(s): GLUCAP in the last 168 hours.     SignedFlorencia Reasons MD, PhD  [facility] 06/18/2015, 7:22 PM

## 2016-04-26 ENCOUNTER — Encounter: Payer: Self-pay | Admitting: Internal Medicine

## 2016-04-26 ENCOUNTER — Ambulatory Visit (INDEPENDENT_AMBULATORY_CARE_PROVIDER_SITE_OTHER): Payer: BLUE CROSS/BLUE SHIELD | Admitting: Internal Medicine

## 2016-04-26 VITALS — BP 154/100 | HR 100 | Temp 97.3°F | Resp 18 | Ht 65.0 in | Wt 141.5 lb

## 2016-04-26 DIAGNOSIS — I10 Essential (primary) hypertension: Secondary | ICD-10-CM

## 2016-04-26 DIAGNOSIS — F909 Attention-deficit hyperactivity disorder, unspecified type: Secondary | ICD-10-CM | POA: Diagnosis not present

## 2016-04-26 DIAGNOSIS — F988 Other specified behavioral and emotional disorders with onset usually occurring in childhood and adolescence: Secondary | ICD-10-CM

## 2016-04-26 MED ORDER — LOSARTAN POTASSIUM 50 MG PO TABS: 50.0000 mg | ORAL_TABLET | Freq: Every day | ORAL | Status: DC

## 2016-04-26 MED ORDER — LISDEXAMFETAMINE DIMESYLATE 50 MG PO CAPS: 50.0000 mg | ORAL_CAPSULE | Freq: Every day | ORAL | Status: DC

## 2016-04-26 MED ORDER — METOPROLOL SUCCINATE ER 50 MG PO TB24: 50.0000 mg | ORAL_TABLET | Freq: Every day | ORAL | Status: DC

## 2016-04-26 NOTE — Patient Instructions (Signed)
It was a pleasure to see you today. Please take care of yourself. Lopressor 50 mg daily. Cozaar 50 mg daily. Return in 3-4 weeks for follow-up and fasting lab work. Vyvanse has been refilled.

## 2016-04-26 NOTE — Progress Notes (Signed)
Subjective:    Patient ID: Jeanne Smith, female    DOB: 23-Dec-1950, 65 y.o.   MRN: [id number]  HPI Patient's husband passed away last week of complications of pancreatic cancer. She is in the process of planning a memorial service. She has a long-standing history of hypertension but has not been taking antihypertensive medication for some time. Previously we had her on Cozaar 50 mg daily and metoprolol 50 mg daily with good control. Apparently Dr. Caprice Beaver had her on Vyvanse for attention deficit disorder is well. She's having some difficulty focusing at the present time in light to get back on it.  She is not fasting today so we'll plan to do fasting labs in the near future.  She's handling her grief extremely well. Says she's sleeping with the help of Tylenol PM although she has Ambien and Ativan at home.  Patient has not been seen here since 2011.  Past medical history: Patient has a long-standing history of right thyroid nodule (colloid nodule) initially worked up in 1984 showing a hot nodule by thyroid scan. It was aspirated in 1985 and found to be benign. Repeat thyroid scan in 1998 showed a hot nodule in the right lower lobe. 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 She started seeing Dr. Forde Dandy in June 1999. She has been off Synthroid since that time. History of uterine fibroids and ovarian cyst followed by Dr. Nori Riis. History of cervical dysplasia.  Tonsillectomy and adenoidectomy as a child. Right lateral breast lymph node excision by Dr. Mendel Ryder in 1997 which was benign. D&C after miscarriage in 1985. Bilateral tubal ligation in 1985. Breast augmentation 1998 by Dr. Dessie Coma. Removal of endocervical polyp and Dr. Nori Riis in February 1999.  She smoked occasionally in college. Social alcohol consumption. She is a former Writer.  Family history: Mother is healthy. Father  died at age 17 with hypertension, prostate and lung cancer. Paternal grandmother with thyroid disease. Brother died at age 46 in 88 with history of alcoholism and experienced a spontaneous cardiac arrest.  Adult son who is a recovering alcoholic. 2 adult daughters.  No known drug allergies  Neck injury 2005  Hysterectomy 2006  Comminuted fracture of the right humeral head and right femur intratrochanteric fracture after a fall on concrete while holding a dog in July 2016.      Review of Systems  Respiratory: Negative.   Cardiovascular: Negative.   Genitourinary: Negative.   Allergic/Immunologic:       History of allergic rhinitis       Objective:   Physical Exam  Constitutional: She is oriented to person, place, and time. She appears well-developed and well-nourished. No distress.  HENT:  Head: Normocephalic and atraumatic.  Right Ear: External ear normal.  Left Ear: External ear normal.  Mouth/Throat: Oropharynx is clear and moist. No oropharyngeal exudate.  Eyes: Conjunctivae and EOM are normal. Pupils are equal, round, and reactive to light. Right eye exhibits no discharge. Left eye exhibits no discharge. No scleral icterus.  Neck: Neck supple. No JVD present. No thyromegaly present.  Cardiovascular: Normal rate, regular rhythm, normal heart sounds and intact distal pulses.   No murmur heard. Pulmonary/Chest: Effort normal and breath sounds normal. She has no wheezes. She has no rales.  Bilateral breast augmentation without masses  Abdominal: Soft. Bowel sounds are normal. She exhibits no distension and no mass. There is no tenderness. There is  no rebound and no guarding.  Musculoskeletal: She exhibits no edema or tenderness.  Neurological: She is alert and oriented to person, place, and time. She has normal reflexes. No cranial nerve deficit. Coordination normal.  Skin: Skin is warm and dry. No rash noted. She is not diaphoretic.  Psychiatric: She has a normal mood and  affect. Her behavior is normal. Judgment and thought content normal.  Vitals reviewed.         Assessment & Plan:  Essential hypertension  Grief reaction  Attention deficit disorder  Plan: Refill Vyvanse 50 mg daily for 30 days. Restart metoprolol 50 mg daily and Cozaar 50 mg daily. Return in about 3-1/2 weeks for fasting lab work and follow-up.

## 2016-05-16 ENCOUNTER — Ambulatory Visit (INDEPENDENT_AMBULATORY_CARE_PROVIDER_SITE_OTHER): Payer: BLUE CROSS/BLUE SHIELD | Admitting: Internal Medicine

## 2016-05-16 ENCOUNTER — Other Ambulatory Visit: Payer: Self-pay | Admitting: Internal Medicine

## 2016-05-16 ENCOUNTER — Encounter: Payer: Self-pay | Admitting: Internal Medicine

## 2016-05-16 VITALS — BP 126/72 | HR 96 | Temp 97.2°F | Resp 18

## 2016-05-16 DIAGNOSIS — Z0000 Encounter for general adult medical examination without abnormal findings: Secondary | ICD-10-CM | POA: Diagnosis not present

## 2016-05-16 DIAGNOSIS — Z1329 Encounter for screening for other suspected endocrine disorder: Secondary | ICD-10-CM

## 2016-05-16 DIAGNOSIS — Z1321 Encounter for screening for nutritional disorder: Secondary | ICD-10-CM

## 2016-05-16 DIAGNOSIS — I10 Essential (primary) hypertension: Secondary | ICD-10-CM

## 2016-05-16 DIAGNOSIS — Z13220 Encounter for screening for lipoid disorders: Secondary | ICD-10-CM

## 2016-05-16 DIAGNOSIS — Z130 Encounter for screening for diseases of the blood and blood-forming organs and certain disorders involving the immune mechanism: Secondary | ICD-10-CM

## 2016-05-16 LAB — CBC WITH DIFFERENTIAL/PLATELET
BASOS ABS: 51 {cells}/uL (ref 0–200)
Basophils Relative: 1 %
EOS ABS: 153 {cells}/uL (ref 15–500)
Eosinophils Relative: 3 %
HCT: 46.9 % — ABNORMAL HIGH (ref 35.0–45.0)
Hemoglobin: 15.8 g/dL — ABNORMAL HIGH (ref 11.7–15.5)
LYMPHS PCT: 27 %
Lymphs Abs: 1377 cells/uL (ref 850–3900)
MCH: 33.1 pg — ABNORMAL HIGH (ref 27.0–33.0)
MCHC: 33.7 g/dL (ref 32.0–36.0)
MCV: 98.1 fL (ref 80.0–100.0)
MPV: 11.3 fL (ref 7.5–12.5)
Monocytes Absolute: 510 cells/uL (ref 200–950)
Monocytes Relative: 10 %
NEUTROS ABS: 3009 {cells}/uL (ref 1500–7800)
NEUTROS PCT: 59 %
Platelets: 240 10*3/uL (ref 140–400)
RBC: 4.78 MIL/uL (ref 3.80–5.10)
RDW: 12.6 % (ref 11.0–15.0)
WBC: 5.1 10*3/uL (ref 3.8–10.8)

## 2016-05-16 LAB — COMPLETE METABOLIC PANEL WITH GFR
ALBUMIN: 4.4 g/dL (ref 3.6–5.1)
ALK PHOS: 111 U/L (ref 33–130)
ALT: 94 U/L — AB (ref 6–29)
AST: 76 U/L — AB (ref 10–35)
BILIRUBIN TOTAL: 0.6 mg/dL (ref 0.2–1.2)
BUN: 18 mg/dL (ref 7–25)
CO2: 27 mmol/L (ref 20–31)
Calcium: 10.1 mg/dL (ref 8.6–10.4)
Chloride: 101 mmol/L (ref 98–110)
Creat: 0.72 mg/dL (ref 0.50–0.99)
GFR, Est African American: >89 mL/min (ref >=60)
GFR, Est Non African American: 88 mL/min (ref >=60)
Glucose, Bld: 122 mg/dL — ABNORMAL HIGH (ref 65–99)
Potassium: 4.6 mmol/L (ref 3.5–5.3)
SODIUM: 140 mmol/L (ref 135–146)
Total Protein: 7.2 g/dL (ref 6.1–8.1)

## 2016-05-16 LAB — LIPID PANEL
CHOL/HDL RATIO: 2.2 ratio (ref <=5.0)
Cholesterol: 240 mg/dL — ABNORMAL HIGH (ref 125–200)
HDL: 107 mg/dL (ref >=46)
LDL Cholesterol: 121 mg/dL (ref <130)
TRIGLYCERIDES: 58 mg/dL (ref <150)
VLDL: 12 mg/dL (ref <30)

## 2016-05-16 LAB — TSH: TSH: 0.45 m[IU]/L

## 2016-05-16 MED ORDER — ALPRAZOLAM 0.25 MG PO TABS: 0.2500 mg | ORAL_TABLET | Freq: Two times a day (BID) | ORAL | Status: DC | PRN

## 2016-05-16 NOTE — Patient Instructions (Signed)
Takes Xanax sparingly for anxiety. Change metoprolol to 25 mg every a.m. and every afternoon. Continue losartan 50 mg daily. Return in 8 weeks. Fasting labs drawn and pending.

## 2016-05-16 NOTE — Progress Notes (Addendum)
   Subjective:    Patient ID: Jeanne Smith, female    DOB: 1951-08-12, 65 y.o.   MRN: [id number]  HPI Patient returns today to follow-up on hypertension. At last visit was started on metoprolol 50 mg daily and losartan 50 mg daily. She would prefer to split up metoprolol to 25 mg twice daily. We can try that. Her blood pressure is excellent today at 126/72. 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 She became anxious at the attorney's office. Is asking for small quantity of Xanax to get through issues such as that. Last visit we gave her 30 days of Vyvanse. She did not ask for refill today. Says she's sleeping okay taking Tylenol PM.    Review of Systems     Objective:   Physical Exam  Spent 20 minutes with her today discussing these issues. She had fasting lab work as well which will be interpreted and reported to her by telephone.      Assessment & Plan:  Essential hypertension-improved. Patient will continue with losartan 50 mg every morning and will change metoprolol to 25 mg twice daily. Return in 8 weeks.  Anxiety-given prescription for Xanax 0.25 mg #20 one by mouth up to twice daily as needed for anxiety with no refill  Attention deficit-given Vyvanse prescription at last visit.  Hot flashes-patient to restart Premarin. She had discontinued it abruptly after last visit. She will discuss with GYN, Dr. Nori Riis at upcoming visit.

## 2016-05-17 LAB — VITAMIN D 25 HYDROXY (VIT D DEFICIENCY, FRACTURES): Vit D, 25-Hydroxy: 19 ng/mL — ABNORMAL LOW (ref 30–100)

## 2016-05-18 LAB — HEMOGLOBIN A1C
HEMOGLOBIN A1C: 5.5 % (ref <5.7)
MEAN PLASMA GLUCOSE: 111 mg/dL

## 2016-05-24 ENCOUNTER — Telehealth: Payer: Self-pay | Admitting: Internal Medicine

## 2016-05-24 MED ORDER — METOPROLOL SUCCINATE ER 50 MG PO TB24: 50.0000 mg | ORAL_TABLET | Freq: Every day | ORAL | Status: DC

## 2016-05-24 MED ORDER — LISDEXAMFETAMINE DIMESYLATE 50 MG PO CAPS: 50.0000 mg | ORAL_CAPSULE | Freq: Every day | ORAL | Status: DC

## 2016-05-24 MED ORDER — LOSARTAN POTASSIUM 50 MG PO TABS: 50.0000 mg | ORAL_TABLET | Freq: Every day | ORAL | Status: DC

## 2016-05-24 NOTE — Telephone Encounter (Signed)
meds sent to pharmacy vyvanse printed for signature.

## 2016-05-24 NOTE — Telephone Encounter (Signed)
Patient needs a refill on Vyvanse 50 mg, Metoprolol, and Losartan.  Please call patient to advise that she doesn't have to come to the office to pick up a written prescription for Vyvanse this time.    Pharmacy:  [facility] @ [address]

## 2016-06-22 ENCOUNTER — Other Ambulatory Visit: Payer: Self-pay | Admitting: Internal Medicine

## 2016-06-28 ENCOUNTER — Other Ambulatory Visit: Payer: Self-pay

## 2016-06-28 MED ORDER — LISDEXAMFETAMINE DIMESYLATE 50 MG PO CAPS: 50.0000 mg | ORAL_CAPSULE | Freq: Every day | ORAL | 0 refills | Status: DC

## 2016-06-28 NOTE — Telephone Encounter (Signed)
Patient called requesting Vyvanse Rx. Advised patient Rx available for p/u.

## 2016-07-11 ENCOUNTER — Ambulatory Visit: Payer: BLUE CROSS/BLUE SHIELD | Admitting: Internal Medicine

## 2016-07-13 ENCOUNTER — Encounter: Payer: Self-pay | Admitting: Internal Medicine

## 2016-07-13 ENCOUNTER — Ambulatory Visit (INDEPENDENT_AMBULATORY_CARE_PROVIDER_SITE_OTHER): Payer: BLUE CROSS/BLUE SHIELD | Admitting: Internal Medicine

## 2016-07-13 VITALS — BP 144/86 | HR 95 | Temp 97.3°F | Ht 65.0 in | Wt 135.0 lb

## 2016-07-13 DIAGNOSIS — G4700 Insomnia, unspecified: Secondary | ICD-10-CM

## 2016-07-13 DIAGNOSIS — F909 Attention-deficit hyperactivity disorder, unspecified type: Secondary | ICD-10-CM

## 2016-07-13 DIAGNOSIS — I10 Essential (primary) hypertension: Secondary | ICD-10-CM | POA: Diagnosis not present

## 2016-07-13 DIAGNOSIS — F988 Other specified behavioral and emotional disorders with onset usually occurring in childhood and adolescence: Secondary | ICD-10-CM

## 2016-07-13 MED ORDER — ZOLPIDEM TARTRATE 10 MG PO TABS: 10.0000 mg | ORAL_TABLET | Freq: Every evening | ORAL | 0 refills | Status: DC | PRN

## 2016-07-13 MED ORDER — ALPRAZOLAM 0.5 MG PO TABS: 0.2500 mg | ORAL_TABLET | Freq: Two times a day (BID) | ORAL | 0 refills | Status: DC | PRN

## 2016-07-13 MED ORDER — LISDEXAMFETAMINE DIMESYLATE 50 MG PO CAPS: 50.0000 mg | ORAL_CAPSULE | Freq: Every day | ORAL | 0 refills | Status: DC

## 2016-07-19 ENCOUNTER — Other Ambulatory Visit: Payer: Self-pay | Admitting: Internal Medicine

## 2016-07-27 ENCOUNTER — Encounter: Payer: Self-pay | Admitting: Internal Medicine

## 2016-07-27 NOTE — Progress Notes (Addendum)
   Subjective:    Patient ID: Jeanne Smith, female    DOB: 07/14/1951, 64 y.o.   MRN: [id number]  HPI In June she was found to be vitamin D deficient with level of 19. Hemoglobin A1c at that time was 5.5%. TSH was normal. Total cholesterol was 240 with an HDL of 107 and an LDL of 121.  She is here today for follow-up on hypertension, attention deficit disorder, grief reaction, anxiety.  She's doing fairly well emotionally.    Review of Systems     Objective:   Physical Exam Skin warm and dry. Nodes none. Chest clear. Cardiac exam regular rate and rhythm. Extremities without edema  Blood pressure 144/86  She is on metoprolol XL 50 mg daily and losartan 50 mg daily. Takes Vyvanse 50 mg daily. Have filled Xanax 0.5 mg to take one half to one twice daily as needed for anxiety when meeting with attorneys. Settling husband's state has been stressful. There is a lot of work to do. Have refilled Ambien to help her sleep.  Spent 25 minutes with her today     Assessment & Plan:  She will continue to monitor her blood pressure at home and advise if persistently elevated  Have refilled Vyvanse for attention deficit disorder. Continue same antihypertensive medication and vitamin D supplementation. Have prescribed Ambien and Xanax to take sparingly.  Return in November  Continue vitamin D supplementation

## 2016-07-27 NOTE — Patient Instructions (Signed)
Continue same medications and return in November.

## 2016-08-17 ENCOUNTER — Other Ambulatory Visit: Payer: Self-pay | Admitting: *Deleted

## 2016-08-17 ENCOUNTER — Telehealth: Payer: Self-pay | Admitting: *Deleted

## 2016-08-17 MED ORDER — LOSARTAN POTASSIUM 50 MG PO TABS: 50.0000 mg | ORAL_TABLET | Freq: Every day | ORAL | 1 refills | Status: DC

## 2016-08-17 NOTE — Telephone Encounter (Signed)
Losartan refilled per verbal request of Dr. Baxley 

## 2016-08-28 ENCOUNTER — Telehealth: Payer: Self-pay | Admitting: Internal Medicine

## 2016-08-28 NOTE — Telephone Encounter (Signed)
Wanted to know if she can pick up a Rx for Vyvanse 50mg  this afternoon?  If this is not enough notice, she'll wait and pick it up in the morning.  Also, she'd like to know if you would be willing to write 3 separate Rx's at once so she doesn't have to come each month?  Or, do you prefer that she continue to come each month?  This would be writing 3 separate prescriptions for 3 months (in advance) so she can just take them directly to the drug store and bypass coming here.  She feels this would keep from aggravating you with this task every month.    Please advise.

## 2016-08-28 NOTE — Telephone Encounter (Signed)
Prefer q month pick up. Please type up 30 day supply

## 2016-08-29 ENCOUNTER — Other Ambulatory Visit: Payer: Self-pay | Admitting: *Deleted

## 2016-08-29 MED ORDER — LISDEXAMFETAMINE DIMESYLATE 50 MG PO CAPS: 50.0000 mg | ORAL_CAPSULE | Freq: Every day | ORAL | 0 refills | Status: DC

## 2016-08-29 NOTE — Telephone Encounter (Signed)
Pt informed

## 2016-09-04 ENCOUNTER — Other Ambulatory Visit: Payer: Self-pay | Admitting: Obstetrics & Gynecology

## 2016-09-04 DIAGNOSIS — Z1231 Encounter for screening mammogram for malignant neoplasm of breast: Secondary | ICD-10-CM

## 2016-09-04 DIAGNOSIS — N644 Mastodynia: Secondary | ICD-10-CM

## 2016-09-21 ENCOUNTER — Other Ambulatory Visit: Payer: Self-pay | Admitting: Internal Medicine

## 2016-09-28 ENCOUNTER — Telehealth: Payer: Self-pay | Admitting: Internal Medicine

## 2016-09-28 ENCOUNTER — Encounter: Payer: Self-pay | Admitting: Internal Medicine

## 2016-09-28 MED ORDER — LISDEXAMFETAMINE DIMESYLATE 50 MG PO CAPS: 50.0000 mg | ORAL_CAPSULE | Freq: Every day | ORAL | 0 refills | Status: DC

## 2016-09-28 NOTE — Telephone Encounter (Signed)
Refill Vyvanse #31 tabs should last until Dec 3

## 2016-10-03 ENCOUNTER — Other Ambulatory Visit: Payer: Self-pay | Admitting: Obstetrics & Gynecology

## 2016-10-03 DIAGNOSIS — N644 Mastodynia: Secondary | ICD-10-CM

## 2016-10-05 ENCOUNTER — Other Ambulatory Visit: Payer: BLUE CROSS/BLUE SHIELD

## 2016-10-05 ENCOUNTER — Ambulatory Visit
Admission: RE | Admit: 2016-10-05 | Discharge: 2016-10-05 | Disposition: A | Discharge status: Home / Self Care | Payer: BLUE CROSS/BLUE SHIELD | Source: Ambulatory Visit | Attending: Obstetrics & Gynecology | Admitting: Obstetrics & Gynecology

## 2016-10-05 DIAGNOSIS — N644 Mastodynia: Secondary | ICD-10-CM

## 2016-10-10 ENCOUNTER — Ambulatory Visit (INDEPENDENT_AMBULATORY_CARE_PROVIDER_SITE_OTHER): Payer: BLUE CROSS/BLUE SHIELD | Admitting: Internal Medicine

## 2016-10-10 ENCOUNTER — Encounter: Payer: Self-pay | Admitting: Internal Medicine

## 2016-10-10 VITALS — BP 134/82 | HR 93 | Temp 97.0°F | Ht 65.0 in | Wt 137.0 lb

## 2016-10-10 DIAGNOSIS — F5102 Adjustment insomnia: Secondary | ICD-10-CM | POA: Diagnosis not present

## 2016-10-10 DIAGNOSIS — I10 Essential (primary) hypertension: Secondary | ICD-10-CM

## 2016-10-10 DIAGNOSIS — R7302 Impaired glucose tolerance (oral): Secondary | ICD-10-CM

## 2016-10-10 DIAGNOSIS — F439 Reaction to severe stress, unspecified: Secondary | ICD-10-CM | POA: Diagnosis not present

## 2016-10-10 DIAGNOSIS — F908 Attention-deficit hyperactivity disorder, other type: Secondary | ICD-10-CM | POA: Diagnosis not present

## 2016-10-10 DIAGNOSIS — Z23 Encounter for immunization: Secondary | ICD-10-CM | POA: Diagnosis not present

## 2016-10-10 LAB — HEMOGLOBIN A1C
Hgb A1c MFr Bld: 4.9 % (ref <5.7)
Mean Plasma Glucose: 94 mg/dL

## 2016-10-10 LAB — TSH: TSH: 0.42 mIU/L

## 2016-10-10 MED ORDER — LISDEXAMFETAMINE DIMESYLATE 50 MG PO CAPS: 50.0000 mg | ORAL_CAPSULE | Freq: Every day | ORAL | 0 refills | Status: DC

## 2016-10-10 NOTE — Patient Instructions (Addendum)
Continue Vyvanse for attention deficit. Take Xanax sparingly for anxiety. Consider counseling. Flu vaccine given. Return in May for physical examination.

## 2016-10-10 NOTE — Progress Notes (Signed)
   Subjective:    Patient ID: Jeanne Smith, female    DOB: [birth year], 65 y.o.   MRN: [id number]  HPI   65 year old Female for follow up on elevated serum glucose and hypertension. Most recent Hgb AIC was normal at 5.5%. TSH also drawn today. History of ADD treated with Vyvanse.  Blood pressure stable at 134/82.  Patient complaining of weight gain with inactivity. Still spending lots of time sorting out paperwork with husband's estate.  Some issues with anxiety and insomnia. She is giving counseling some consideration.  30 minutes spent with patient discussing situational stress and issues.  Flu vaccine given    Review of Systems see above     Objective:   Physical Exam Chest clear. Cardiac exam regular rate and rhythm. Extremities without edema.       Assessment & Plan:  Attention deficit disorder. Vyvanse refilled through early January.  Essential hypertension-continue same treatment with losartan and metoprolol.  Fibrocystic breast disease-Soll GYN recently.  Menopausal symptoms-Gyn recently put her on Premarin  Insomnia-takes Ambien for sleep  Anxiety-take Xanax sparingly  Plan: Return in May for physical examination. Consider counseling.

## 2016-10-11 ENCOUNTER — Telehealth: Payer: Self-pay | Admitting: Internal Medicine

## 2016-10-11 NOTE — Telephone Encounter (Signed)
Left message informing patient labs looked good.

## 2016-10-11 NOTE — Telephone Encounter (Signed)
-----   Message from Elby Showers, MD sent at 10/11/2016 11:29 AM EST ----- Please call patient. Hemoglobin A1c is normal at 4.9% and TSH is normal.

## 2016-10-17 ENCOUNTER — Other Ambulatory Visit: Payer: Self-pay | Admitting: Internal Medicine

## 2016-10-17 NOTE — Telephone Encounter (Signed)
Please refill once each

## 2016-10-17 NOTE — Telephone Encounter (Signed)
[facility]

## 2016-11-01 ENCOUNTER — Other Ambulatory Visit: Payer: Self-pay | Admitting: Internal Medicine

## 2016-11-01 MED ORDER — LISDEXAMFETAMINE DIMESYLATE 50 MG PO CAPS: 50.0000 mg | ORAL_CAPSULE | Freq: Every day | ORAL | 0 refills | Status: DC

## 2016-11-01 NOTE — Telephone Encounter (Signed)
Patient brought vyvanse rx in.  The script was written for 2018 not 2017.  A new script has been printed and patient can pick up.

## 2016-11-30 ENCOUNTER — Telehealth: Payer: Self-pay | Admitting: Internal Medicine

## 2016-11-30 ENCOUNTER — Encounter: Payer: Self-pay | Admitting: Internal Medicine

## 2016-11-30 MED ORDER — LISDEXAMFETAMINE DIMESYLATE 50 MG PO CAPS: 50.0000 mg | ORAL_CAPSULE | Freq: Every day | ORAL | 0 refills | Status: DC

## 2016-11-30 NOTE — Telephone Encounter (Signed)
Vyvanse refilled #31 tablets one by mouth daily

## 2016-11-30 NOTE — Telephone Encounter (Signed)
Patient called requesting to pick up a prescription for Vyvanse tomorrow. Please advise.

## 2016-12-20 ENCOUNTER — Other Ambulatory Visit: Payer: Self-pay | Admitting: Internal Medicine

## 2017-01-10 ENCOUNTER — Telehealth: Payer: Self-pay | Admitting: Internal Medicine

## 2017-01-10 NOTE — Telephone Encounter (Signed)
Print up Ambien x 90 days and Vyvanse for 31 days

## 2017-01-10 NOTE — Telephone Encounter (Signed)
Patient calling to get a refill on her Ambien and Vyvanse.  Updated her pharmacy information.  [facility]

## 2017-01-11 MED ORDER — ZOLPIDEM TARTRATE 10 MG PO TABS: 10.0000 mg | ORAL_TABLET | Freq: Every evening | ORAL | 0 refills | Status: DC | PRN

## 2017-01-11 MED ORDER — LISDEXAMFETAMINE DIMESYLATE 50 MG PO CAPS: 50.0000 mg | ORAL_CAPSULE | Freq: Every day | ORAL | 0 refills | Status: DC

## 2017-04-09 ENCOUNTER — Other Ambulatory Visit: Payer: Self-pay | Admitting: Internal Medicine

## 2017-04-13 ENCOUNTER — Other Ambulatory Visit: Payer: BLUE CROSS/BLUE SHIELD | Admitting: Internal Medicine

## 2017-04-16 ENCOUNTER — Encounter: Payer: BLUE CROSS/BLUE SHIELD | Admitting: Internal Medicine

## 2017-04-18 ENCOUNTER — Telehealth: Payer: Self-pay

## 2017-04-18 MED ORDER — LISDEXAMFETAMINE DIMESYLATE 50 MG PO CAPS: 50.0000 mg | ORAL_CAPSULE | Freq: Every day | ORAL | 0 refills | Status: DC

## 2017-04-18 NOTE — Telephone Encounter (Signed)
Pt is aware and will pick up tomorrow. °

## 2017-04-18 NOTE — Telephone Encounter (Signed)
Patient called requesting refill on Vyvanse 50 mg, please advise

## 2017-04-18 NOTE — Telephone Encounter (Signed)
Refill x 31 days only. May pick up Rx tomorrow.

## 2017-04-19 ENCOUNTER — Other Ambulatory Visit: Payer: Self-pay | Admitting: Internal Medicine

## 2017-04-19 NOTE — Telephone Encounter (Signed)
Refill x 6 months 

## 2017-04-20 ENCOUNTER — Telehealth: Payer: Self-pay

## 2017-04-20 NOTE — Telephone Encounter (Signed)
Received a fax for provider signature and faxed that back to [facility], still pending approval/denial

## 2017-04-20 NOTE — Telephone Encounter (Signed)
Called [employer] to discuss the denial of the Vyvanse, they began another PA while on the phone. [employer] stated that they denied this in 12/2016 but since it had been 60 days we could attempt again. Submitted the request at 9:17am on 04/20/17. Reference # [id number]. We will have a result in 48-72 hours.

## 2017-04-25 NOTE — Telephone Encounter (Signed)
Received fax from [facility] and the PA was approved

## 2017-05-22 ENCOUNTER — Telehealth: Payer: Self-pay | Admitting: Internal Medicine

## 2017-05-22 ENCOUNTER — Other Ambulatory Visit: Payer: BLUE CROSS/BLUE SHIELD | Admitting: Internal Medicine

## 2017-05-22 MED ORDER — LISDEXAMFETAMINE DIMESYLATE 50 MG PO CAPS: 50.0000 mg | ORAL_CAPSULE | Freq: Every day | ORAL | 0 refills | Status: DC

## 2017-05-22 NOTE — Telephone Encounter (Signed)
Refill once 

## 2017-05-22 NOTE — Telephone Encounter (Signed)
Patient calling to request refill on her Vyvanse 50 mg.  Last filled on 5/23.    Patient can be reached at [phone] when this is ready to be picked up.  Thank you.

## 2017-05-24 ENCOUNTER — Encounter: Payer: BLUE CROSS/BLUE SHIELD | Admitting: Internal Medicine

## 2017-06-04 ENCOUNTER — Other Ambulatory Visit: Payer: Self-pay | Admitting: Internal Medicine

## 2017-06-04 ENCOUNTER — Other Ambulatory Visit: Payer: BLUE CROSS/BLUE SHIELD | Admitting: Internal Medicine

## 2017-06-04 DIAGNOSIS — I10 Essential (primary) hypertension: Secondary | ICD-10-CM

## 2017-06-04 DIAGNOSIS — R7302 Impaired glucose tolerance (oral): Secondary | ICD-10-CM

## 2017-06-04 DIAGNOSIS — Z0000 Encounter for general adult medical examination without abnormal findings: Secondary | ICD-10-CM

## 2017-06-05 LAB — COMPLETE METABOLIC PANEL WITH GFR
ALT: 51 U/L — AB (ref 6–29)
AST: 52 U/L — AB (ref 10–35)
Albumin: 4.2 g/dL (ref 3.6–5.1)
Alkaline Phosphatase: 104 U/L (ref 33–130)
BUN: 11 mg/dL (ref 7–25)
CHLORIDE: 101 mmol/L (ref 98–110)
CO2: 25 mmol/L (ref 20–31)
CREATININE: 0.69 mg/dL (ref 0.50–0.99)
Calcium: 9.9 mg/dL (ref 8.6–10.4)
GFR, Est Non African American: >89 mL/min (ref >=60)
Glucose, Bld: 113 mg/dL — ABNORMAL HIGH (ref 65–99)
Potassium: 4.8 mmol/L (ref 3.5–5.3)
SODIUM: 138 mmol/L (ref 135–146)
Total Bilirubin: 0.8 mg/dL (ref 0.2–1.2)
Total Protein: 7.3 g/dL (ref 6.1–8.1)

## 2017-06-05 LAB — CBC WITH DIFFERENTIAL/PLATELET
BASOS PCT: 1 %
Basophils Absolute: 62 cells/uL (ref 0–200)
EOS PCT: 5 %
Eosinophils Absolute: 310 cells/uL (ref 15–500)
HCT: 47 % — ABNORMAL HIGH (ref 35.0–45.0)
Hemoglobin: 15.7 g/dL — ABNORMAL HIGH (ref 11.7–15.5)
LYMPHS PCT: 32 %
Lymphs Abs: 1984 cells/uL (ref 850–3900)
MCH: 33.6 pg — ABNORMAL HIGH (ref 27.0–33.0)
MCHC: 33.4 g/dL (ref 32.0–36.0)
MCV: 100.6 fL — ABNORMAL HIGH (ref 80.0–100.0)
MONOS PCT: 9 %
MPV: 11.1 fL (ref 7.5–12.5)
Monocytes Absolute: 558 cells/uL (ref 200–950)
Neutro Abs: 3286 cells/uL (ref 1500–7800)
Neutrophils Relative %: 53 %
PLATELETS: 205 10*3/uL (ref 140–400)
RBC: 4.67 MIL/uL (ref 3.80–5.10)
RDW: 13.2 % (ref 11.0–15.0)
WBC: 6.2 10*3/uL (ref 3.8–10.8)

## 2017-06-05 LAB — MICROALBUMIN / CREATININE URINE RATIO
CREATININE, URINE: 394 mg/dL — AB (ref 20–320)
Microalb Creat Ratio: 8 mcg/mg creat (ref <30)
Microalb, Ur: 3.2 mg/dL

## 2017-06-05 LAB — LIPID PANEL
CHOL/HDL RATIO: 3 ratio (ref <5.0)
Cholesterol: 272 mg/dL — ABNORMAL HIGH (ref <200)
HDL: 91 mg/dL (ref >50)
LDL Cholesterol: 156 mg/dL — ABNORMAL HIGH (ref <100)
Triglycerides: 123 mg/dL (ref <150)
VLDL: 25 mg/dL (ref <30)

## 2017-06-05 LAB — TSH: TSH: 0.63 mIU/L

## 2017-06-05 LAB — HEMOGLOBIN A1C
Hgb A1c MFr Bld: 5.2 % (ref <5.7)
MEAN PLASMA GLUCOSE: 103 mg/dL

## 2017-06-08 ENCOUNTER — Encounter: Payer: Self-pay | Admitting: Internal Medicine

## 2017-06-08 ENCOUNTER — Ambulatory Visit (INDEPENDENT_AMBULATORY_CARE_PROVIDER_SITE_OTHER): Payer: Medicare Other | Admitting: Internal Medicine

## 2017-06-08 ENCOUNTER — Other Ambulatory Visit (HOSPITAL_COMMUNITY)
Admission: RE | Admit: 2017-06-08 | Discharge: 2017-06-08 | Disposition: A | Discharge status: Home / Self Care | Payer: Medicare Other | Source: Ambulatory Visit | Attending: Internal Medicine | Admitting: Internal Medicine

## 2017-06-08 VITALS — BP 154/98 | HR 103 | Temp 97.9°F | Resp 20 | Ht 65.0 in | Wt 139.0 lb

## 2017-06-08 DIAGNOSIS — M25561 Pain in right knee: Secondary | ICD-10-CM

## 2017-06-08 DIAGNOSIS — Z0000 Encounter for general adult medical examination without abnormal findings: Secondary | ICD-10-CM

## 2017-06-08 DIAGNOSIS — F439 Reaction to severe stress, unspecified: Secondary | ICD-10-CM | POA: Diagnosis not present

## 2017-06-08 DIAGNOSIS — G4709 Other insomnia: Secondary | ICD-10-CM

## 2017-06-08 DIAGNOSIS — M25562 Pain in left knee: Secondary | ICD-10-CM

## 2017-06-08 DIAGNOSIS — R030 Elevated blood-pressure reading, without diagnosis of hypertension: Secondary | ICD-10-CM | POA: Diagnosis not present

## 2017-06-08 DIAGNOSIS — I10 Essential (primary) hypertension: Secondary | ICD-10-CM

## 2017-06-08 DIAGNOSIS — F419 Anxiety disorder, unspecified: Secondary | ICD-10-CM

## 2017-06-08 DIAGNOSIS — F908 Attention-deficit hyperactivity disorder, other type: Secondary | ICD-10-CM

## 2017-06-08 DIAGNOSIS — Z124 Encounter for screening for malignant neoplasm of cervix: Secondary | ICD-10-CM | POA: Insufficient documentation

## 2017-06-08 DIAGNOSIS — E7800 Pure hypercholesterolemia, unspecified: Secondary | ICD-10-CM | POA: Diagnosis not present

## 2017-06-08 LAB — POCT URINALYSIS DIPSTICK
BILIRUBIN UA: NEGATIVE
GLUCOSE UA: NEGATIVE
KETONES UA: NEGATIVE
Leukocytes, UA: NEGATIVE
Nitrite, UA: NEGATIVE
PH UA: 6.5 (ref 5.0–8.0)
Protein, UA: NEGATIVE
RBC UA: NEGATIVE
Spec Grav, UA: 1.01 (ref 1.010–1.025)
Urobilinogen, UA: 0.2 E.U./dL

## 2017-06-08 MED ORDER — AMPHETAMINE-DEXTROAMPHETAMINE 15 MG PO TABS
15.0000 mg | ORAL_TABLET | Freq: Every day | ORAL | 0 refills | Status: DC
Start: 2017-06-08 — End: 2017-07-23

## 2017-06-08 MED ORDER — METOPROLOL SUCCINATE ER 100 MG PO TB24: 100.0000 mg | ORAL_TABLET | Freq: Every day | ORAL | 0 refills | Status: DC

## 2017-06-08 NOTE — Progress Notes (Signed)
Subjective:    Patient ID: Jeanne Smith, female    DOB: [birth year], 67 y.o.   MRN: [id number]  HPI Pleasant 66 year old White Female in today for Welcome to Medicare physical exam.  She has mild elevation of SGOT and SGPT at 52 and 51 respectively. Had this previously at 8 and 94 respectively. Total cholesterol was 272 and previously was 240. LDL cholesterol 156. Fasting glucose 113 but normal hemoglobin A1c. TSH is normal.  In July 2016, she suffered a comminuted intertrochanteric fracture of her right femur, comminuted fracture of the right humeral head. This occurred after a fall on concrete while holding a dog. Intertrochanteric fracture required nailing by Dr. Fredonia Highland.  After that she reestablished here in May 2017. That was just after her husband passed away from complications of pancreatic cancer. We restarted her antihypertensive medication and her attention deficit medication.  She has long-standing history of right thyroid nodule (colloid nodule (initially worked up in 1984 showing a hot nodule by thyroid scan.It was aspirated in 1985 and was found to be benign. Repeat thyroid scan in 1998 showed a hot nodule in the right lower lobe. Ultrasound in May 1998 revealed a 3.8 solid hyperfunctioning nodule in the lower right lobe of the thyroid compatible with a functioning adenoma. She started seeing Dr. Forde Dandy in June 1999. She has been off Synthroid since that time. History of uterine fibroids and ovarian cyst followed by Dr. Nori Riis. History of cervical dysplasia.  Tonsillectomy and adenoidectomy as a child. Right lateral breast lymph node excision by Dr. Mendel Ryder in 1997 which was benign. D&C after miscarriage in 1985. Bilateral tubal ligation in 1985. Breast augmentation 1998 by Dr. Dessie Coma. Removal of endocervical polyp by Dr. Nori Riis in February 1999.   She smoked occasionally in college. Social alcohol consumption. She is a former Writer.  Family history: Father died at  age 42 with hypertension, prostate and lung cancer. Mother healthy. Paternal grandmother with thyroid disease. Brother died at age 26 and 37 with history of alcoholism and experienced a spontaneous cardiac arrest. Adult son who is a recovering alcoholic. 2 adult daughters.  No known drug allergies  Neck injury 2005  Hysterectomy 2006     Review of Systems history of allergic rhinitis, long-standing history of attention deficit issues with issues getting organized. This has been an issue with paperwork and inventory she has had to do you regarding her husband's estate     Objective:   Physical Exam  Constitutional: She is oriented to person, place, and time. She appears well-developed and well-nourished. No distress.  HENT:  Head: Normocephalic and atraumatic.  Right Ear: External ear normal.  Left Ear: External ear normal.  Mouth/Throat: Oropharynx is clear and moist. No oropharyngeal exudate.  Eyes: Pupils are equal, round, and reactive to light. Conjunctivae are normal.  Neck: Neck supple. No JVD present. No thyromegaly present.  Cardiovascular: Normal rate, regular rhythm, normal heart sounds and intact distal pulses.   No murmur heard. Pulmonary/Chest: Effort normal and breath sounds normal. No respiratory distress. She has no wheezes. She has no rales.  Abdominal: Soft. Bowel sounds are normal. She exhibits no distension and no mass. There is no tenderness. There is no rebound and no guarding.  Genitourinary:  Genitourinary Comments: Pap taken of vaginal cuff. Bimanual normal.  Musculoskeletal: She exhibits no edema.  Lymphadenopathy:    She has no cervical adenopathy.  Neurological: She is alert and oriented to person, place, and time. She has normal reflexes. No  cranial nerve deficit. Coordination normal.  Skin: Skin is warm and dry. No rash noted. She is not diaphoretic.  Psychiatric: She has a normal mood and affect. Her behavior is normal. Judgment and thought content  normal.  Vitals reviewed.         Assessment & Plan:  Essential hypertension-has element of office hypertension. Blood pressure elevated at 154/98 with pulse of 103  Attention deficit disorder-treated with Vyvanse but will change to Adderall 15 mg daily and see if blood pressure improves  Hyperlipidemia  History of thyroid nodules  Elevated fasting serum glucose but normal hemoglobin A1c  Anxiety is mild elevation of liver functions  Insomnia treated with Ambien  Estrogen replacement  Plan: Continue current medications and reevaluate lipids in 6 months. May need lipid-lowering medication. Need to recheck blood pressure in about 4 weeks.  Subjective:   Patient presents for Medicare Annual/Subsequent preventive examination.  Review Past Medical/Family/Social:The above   Risk Factors  Current exercise habits: Physically fairly active Dietary issues discussed: Low fat low carbohydrate  Cardiac risk factors:Hypertension, hyperlipidemia  Depression Screen  (Note: if answer to either of the following is "Yes", a more complete depression screening is indicated)   Over the past two weeks, have you felt down, depressed or hopeless? No  Over the past two weeks, have you felt little interest or pleasure in doing things? No Have you lost interest or pleasure in daily life? No Do you often feel hopeless? No Do you cry easily over simple problems? No   Activities of Daily Living  In your present state of health, do you have any difficulty performing the following activities?:   Driving? No  Managing money? No  Feeding yourself? No  Getting from bed to chair? No  Climbing a flight of stairs? No  Preparing food and eating?: No  Bathing or showering? No  Getting dressed: No  Getting to the toilet? No  Using the toilet:No  Moving around from place to place: No  In the past year have you fallen or had a near fall?:No  Are you sexually active? Yes Do you have more than one  partner? No   Hearing Difficulties: No  Do you often ask people to speak up or repeat themselves? No  Do you experience ringing or noises in your ears? No  Do you have difficulty understanding soft or whispered voices? No  Do you feel that you have a problem with memory? No Do you often misplace items? No    Home Safety:  Do you have a smoke alarm at your residence? Yes Do you have grab bars in the bathroom? No Do you have throw rugs in your house? No   Cognitive Testing  Alert? Yes Normal Appearance?Yes  Oriented to person? Yes Place? Yes  Time? Yes  Recall of three objects? Yes  Can perform simple calculations? Yes  Displays appropriate judgment?Yes  Can read the correct time from a watch face?Yes   List the Names of Other Physician/Practitioners you currently use:  See referral list for the physicians patient is currently seeing.     Review of Systems: See above   Objective:     General appearance: Appears Younger than stated age Head: Normocephalic, without obvious abnormality, atraumatic  Eyes: conj clear, EOMi PEERLA  Ears: normal TM's and external ear canals both ears  Nose: Nares normal. Septum midline. Mucosa normal. No drainage or sinus tenderness.  Throat: lips, mucosa, and tongue normal; teeth and gums normal  Neck: no  adenopathy, no carotid bruit, no JVD, supple, symmetrical, trachea midline and thyroid not enlarged, symmetric, no tenderness/mass/nodules  No CVA tenderness.  Lungs: clear to auscultation bilaterally  Breasts: normal appearance, no masses or tenderness Heart: regular rate and rhythm, S1, S2 normal, no murmur, click, rub or gallop  Abdomen: soft, non-tender; bowel sounds normal; no masses, no organomegaly  Musculoskeletal: ROM normal in all joints, no crepitus, no deformity, Normal muscle strengthen. Back  is symmetric, no curvature. Skin: Skin color, texture, turgor normal. No rashes or lesions  Lymph nodes: Cervical, supraclavicular, and  axillary nodes normal.  Neurologic: CN 2 -12 Normal, Normal symmetric reflexes. Normal coordination and gait  Psych: Alert & Oriented x 3, Mood appear stable.    Assessment:    Annual wellness medicare exam   Plan:    During the course of the visit the patient was educated and counseled about appropriate screening and preventive services including:   Annual mammogram  Is had Prevnar 13 and needs pneumococcal 23     Patient Instructions (the written plan) was given to the patient.  Medicare Attestation  I have personally reviewed:  The patient's medical and social history  Their use of alcohol, tobacco or illicit drugs  Their current medications and supplements  The patient's functional ability including ADLs,fall risks, home safety risks, cognitive, and hearing and visual impairment  Diet and physical activities  Evidence for depression or mood disorders  The patient's weight, height, BMI, and visual acuity have been recorded in the chart. I have made referrals, counseling, and provided education to the patient based on review of the above and I have provided the patient with a written personalized care plan for preventive services.

## 2017-06-09 LAB — SEDIMENTATION RATE: Sed Rate: 7 mm/hr (ref 0–30)

## 2017-06-11 LAB — CYCLIC CITRUL PEPTIDE ANTIBODY, IGG

## 2017-06-11 LAB — ANA: Anti Nuclear Antibody(ANA): NEGATIVE

## 2017-06-12 LAB — CYTOLOGY - PAP: DIAGNOSIS: NEGATIVE

## 2017-06-24 NOTE — Patient Instructions (Addendum)
Attention deficit medicine has been changed from Vyvanse to Adderall. Follow-up in 4 weeks. May need to see orthopedist regarding knee pain. Watch diet. We need to recheck lipids in 6 months.

## 2017-06-29 ENCOUNTER — Encounter: Payer: Self-pay | Admitting: Internal Medicine

## 2017-06-29 ENCOUNTER — Ambulatory Visit (INDEPENDENT_AMBULATORY_CARE_PROVIDER_SITE_OTHER): Payer: Medicare Other | Admitting: Internal Medicine

## 2017-06-29 VITALS — BP 154/100 | HR 91 | Temp 97.5°F | Wt 140.0 lb

## 2017-06-29 DIAGNOSIS — F900 Attention-deficit hyperactivity disorder, predominantly inattentive type: Secondary | ICD-10-CM

## 2017-06-29 DIAGNOSIS — F419 Anxiety disorder, unspecified: Secondary | ICD-10-CM

## 2017-06-29 DIAGNOSIS — I10 Essential (primary) hypertension: Secondary | ICD-10-CM | POA: Diagnosis not present

## 2017-06-29 NOTE — Progress Notes (Signed)
   Subjective:    Patient ID: Jeanne Smith, female    DOB: 05-31-1951, 66 y.o.   MRN: [id number]  HPI She is here today to follow-up on hypertension. Blood pressure is elevated at 154/100. She is concerned about daughter's upcoming wedding. Also continues to attempt to settle husband's estate. It is very time consuming. We started her on losartan 50 mg daily in May in addition to metoprolol 100 mg in 24 hours. She is wondering if attention deficit medication is raising her blood pressure. In July we changed from Vyvanse to Adderall 15 mg daily. I don't see much change in her blood pressure. Examiner roll is working okay.     Review of Systems     Objective:   Physical Exam Chest clear. Cardiac exam regular rate and rhythm normal S1 and S2. Extremities without edema.       Assessment & Plan:  Essential hypertension  Attention deficit disorder  Anxiety  Situational stress  Plan: Would like to follow-up with her in October with blood pressure check and visit regarding attention deficit issues and situational stress.

## 2017-07-15 ENCOUNTER — Encounter: Payer: Self-pay | Admitting: Internal Medicine

## 2017-07-15 NOTE — Patient Instructions (Addendum)
Patient promises to monitor blood pressure at home. Says it's running better than in this office. Says she has an element of office hypertension. Follow-up in the Fall.

## 2017-07-17 ENCOUNTER — Other Ambulatory Visit: Payer: Self-pay | Admitting: Internal Medicine

## 2017-07-20 ENCOUNTER — Other Ambulatory Visit: Payer: Self-pay | Admitting: Internal Medicine

## 2017-07-23 ENCOUNTER — Other Ambulatory Visit: Payer: Self-pay

## 2017-07-23 NOTE — Telephone Encounter (Signed)
Do you see that she has had it at all since July?

## 2017-07-23 NOTE — Telephone Encounter (Signed)
Pt called requesting a refill on her Adderall please advise.

## 2017-07-24 MED ORDER — AMPHETAMINE-DEXTROAMPHETAMINE 15 MG PO TABS: 15.0000 mg | ORAL_TABLET | Freq: Every day | ORAL | 0 refills | Status: DC

## 2017-07-24 NOTE — Telephone Encounter (Signed)
Last filled 7.13.18

## 2017-08-20 ENCOUNTER — Ambulatory Visit (INDEPENDENT_AMBULATORY_CARE_PROVIDER_SITE_OTHER): Payer: Medicare Other | Admitting: Internal Medicine

## 2017-08-20 ENCOUNTER — Encounter: Payer: Self-pay | Admitting: Internal Medicine

## 2017-08-20 VITALS — BP 130/82 | HR 100 | Temp 97.4°F | Wt 141.0 lb

## 2017-08-20 DIAGNOSIS — F419 Anxiety disorder, unspecified: Secondary | ICD-10-CM | POA: Diagnosis not present

## 2017-08-20 DIAGNOSIS — I10 Essential (primary) hypertension: Secondary | ICD-10-CM

## 2017-08-20 DIAGNOSIS — F439 Reaction to severe stress, unspecified: Secondary | ICD-10-CM

## 2017-08-20 DIAGNOSIS — F900 Attention-deficit hyperactivity disorder, predominantly inattentive type: Secondary | ICD-10-CM

## 2017-08-20 MED ORDER — AMPHETAMINE-DEXTROAMPHET ER 30 MG PO CP24: 30.0000 mg | ORAL_CAPSULE | ORAL | 0 refills | Status: DC

## 2017-08-20 NOTE — Progress Notes (Signed)
   Subjective:    Patient ID: Jeanne Smith, female    DOB: [birth year], 66 y.o.   MRN: [id number]  HPI  in July her blood pressure was elevated at 154/98 with pulse of 103. She was on Vyvanse. We changed her to Adderall 15 mg daily. In May she had been started on losartan 50 mg daily in addition to metoprolol 100 mg daily. She says blood pressure has been running fine at home although it was elevated in August at 154/100. At the time she was concerned about her daughter's upcoming wedding.  Today her blood pressure is excellent at 130/82.  Daughter's fianc broke the engagement about 3 weeks ago and the wedding is canceled. It was 2 been on October 7. There is some relief about that but also some stress.  Patient is finding it hard to concentrate.  She had Medicare physical exam in July    Review of Systems     Objective:   Physical Exam  Spent 25 minutes speaking with her about these issues.  Needs to concentrate in order to settle her husband's estate and help her daughter through this trying time.      Assessment & Plan:  Attention deficit disorder-previously treated with Vyvanse but now on Adderall. Increase Adderall from 15 to 30 mg daily.  Essential hypertension-continue to monitor has labile component. Patient to let me know how this increased dose of Adderall is over the coming 4 weeks Patient will continue to monitor blood pressure at home.

## 2017-08-20 NOTE — Patient Instructions (Signed)
Increase Adderall to 30 mg daily and let me know how this dose is doing over the next 4 weeks. Continue to monitor blood pressure at home.

## 2017-08-27 ENCOUNTER — Ambulatory Visit (INDEPENDENT_AMBULATORY_CARE_PROVIDER_SITE_OTHER): Payer: Medicare Other | Admitting: Orthopaedic Surgery

## 2017-08-28 ENCOUNTER — Other Ambulatory Visit: Payer: Self-pay | Admitting: *Deleted

## 2017-08-28 ENCOUNTER — Ambulatory Visit (INDEPENDENT_AMBULATORY_CARE_PROVIDER_SITE_OTHER): Payer: Medicare Other

## 2017-08-28 ENCOUNTER — Ambulatory Visit (INDEPENDENT_AMBULATORY_CARE_PROVIDER_SITE_OTHER): Payer: Self-pay

## 2017-08-28 ENCOUNTER — Ambulatory Visit (INDEPENDENT_AMBULATORY_CARE_PROVIDER_SITE_OTHER): Payer: Medicare Other | Admitting: Orthopaedic Surgery

## 2017-08-28 ENCOUNTER — Encounter (INDEPENDENT_AMBULATORY_CARE_PROVIDER_SITE_OTHER): Payer: Self-pay | Admitting: Orthopaedic Surgery

## 2017-08-28 VITALS — BP 165/93 | HR 97 | Resp 14 | Ht 64.0 in | Wt 140.0 lb

## 2017-08-28 DIAGNOSIS — M25552 Pain in left hip: Principal | ICD-10-CM

## 2017-08-28 DIAGNOSIS — M25551 Pain in right hip: Secondary | ICD-10-CM

## 2017-08-28 NOTE — Progress Notes (Signed)
Office Visit Note   Patient: Jeanne Smith           Date of Birth: 01-20-1951           MRN: [id number] Visit Date: 08/28/2017              Requested by: Elby Showers, MD [address] [place], [place] [id number] PCP: Elby Showers, MD   Assessment & Plan: Visit Diagnoses:  1. Pain in left hip   2. Pain in right hip   Primary osteoarthritis both hips  Plan: Long discussion regarding diagnosis and treatment options. [name] has been using ibuprofen up to 800 mg several times a day. I think it's worth considering cortisone injection with Dr. Ernestina Patches and we will consult him. Long discussion regarding eventual hip replacement.  Follow-Up Instructions: No Follow-up on file.   Orders:  Orders Placed This Encounter  Procedures  . XR HIPS BILAT W OR W/O PELVIS 3-4 VIEWS   No orders of the defined types were placed in this encounter.     Procedures: No procedures performed   Clinical Data: No additional findings.   Subjective: Chief Complaint  Patient presents with  . Left Knee - Pain, Edema  . Right Knee - Edema, Pain    Jeanne Smith is a 66 y o that presents with chronic bilateral knee pain 6 months and is gradually gettitng worse.Denies injury, no back pain.  . Left Hip - Pain, Groin Pain    Bilateral groin pain, trouble sleeping,  Right more than left. Hard to get up from sitting position.  . Right Hip - Pain, Groin Pain  Injury in 2016 with a nondisplaced fracture of her right shoulder that has healed. Also sustained a right hip fracture that was treated with an intramedullary nail. She's been having increasing pain over period of months to  the point where she has gained about 20 pounds and has limited her activity because of her bilateral groin and thigh pain. The recent injury or trauma. No fever or chills. Experiencing some knee pain that seems to be referred from her hips  HPI  Review of Systems  Constitutional: Positive for fatigue. Negative for chills  and fever.  Eyes: Negative for itching.  Respiratory: Negative for chest tightness and shortness of breath.   Cardiovascular: Negative for chest pain, palpitations and leg swelling.  Gastrointestinal: Negative for blood in stool, constipation and diarrhea.  Musculoskeletal: Negative for back pain, joint swelling, neck pain and neck stiffness.  Neurological: Positive for headaches. Negative for dizziness, weakness and numbness.  Hematological: Does not bruise/bleed easily.  Psychiatric/Behavioral: Positive for sleep disturbance. The patient is not nervous/anxious.      Objective: Vital Signs: BP (!) 165/93   Pulse 97   Resp 14   Ht 5\' 4"  (1.626 m)   Wt 140 lb (63.5 kg)   BMI 24.03 kg/m   Physical Exam  Ortho Exam awake alert and oriented 3 comfortable sitting. Cannot cross left leg over right or right leg over the left related to groin pain and stiffness. Considerable pain with internal/external rotation of both of her hips. Skin intact. Hip incisions of healed nicely from 2016 surgery. No obvious knee effusion or instability. No localized knee pain. No calf discomfort or swelling. Neurovascular exam intact. Walks without a limp below little bit stiff when first getting up from a sitting position. Straight leg raise negative. No percussible back pain. Painless range of motion right shoulder without local tenderness  Specialty  Comments:  No specialty comments available.  Imaging: Xr Hips Bilat W Or W/o Pelvis 3-4 Views  Result Date: 08/28/2017 AP the pelvis and bilateral lateral hip films were obtained. There is evidence of osteoarthritis in both hips without collapse. Joint spaces are narrowed. Peripheral osteophytes and subchondral sclerosis. There appears to be more arthritic change in the left than the right hip. There also is a intramedullary nail in the right hip in good position. No obvious fracture nonunion from 2016 surgery    PMFS History: Patient Active Problem List    Diagnosis Date Noted  . Intertrochanteric fracture of right femur ([place]) 06/15/2015  . Comminuted right humeral fracture 06/15/2015  . HTN (hypertension) 06/15/2015   Past Medical History:  Diagnosis Date  . Arthritis    oa  . Attention deficit disorder   . H/O seasonal allergies   . Hypertension     Family History  Problem Relation Age of Onset  . Hypertension Father   . Cancer Father   . Heart disease Brother     Past Surgical History:  Procedure Laterality Date  . ABDOMINAL HYSTERECTOMY    . FEMUR IM NAIL Right 06/16/2015   Procedure: INTRAMEDULLARY (IM) NAIL FEMORAL;  Surgeon: Renette Butters, MD;  Location: [facility];  Service: Orthopedics;  Laterality: Right;   Social History   Occupational History  . Not on file.   Social History Main Topics  . Smoking status: Former Smoker    Quit date: 11/27/2005  . Smokeless tobacco: Never Used  . Alcohol use Yes     Comment: wine almost daily   . Drug use: No  . Sexual activity: Not on file

## 2017-09-14 ENCOUNTER — Ambulatory Visit (INDEPENDENT_AMBULATORY_CARE_PROVIDER_SITE_OTHER): Payer: Medicare Other

## 2017-09-14 ENCOUNTER — Telehealth: Payer: Self-pay | Admitting: Internal Medicine

## 2017-09-14 ENCOUNTER — Ambulatory Visit (INDEPENDENT_AMBULATORY_CARE_PROVIDER_SITE_OTHER): Payer: Medicare Other | Admitting: Physical Medicine and Rehabilitation

## 2017-09-14 DIAGNOSIS — M25562 Pain in left knee: Secondary | ICD-10-CM

## 2017-09-14 DIAGNOSIS — M25551 Pain in right hip: Secondary | ICD-10-CM

## 2017-09-14 DIAGNOSIS — G8929 Other chronic pain: Secondary | ICD-10-CM

## 2017-09-14 DIAGNOSIS — M25552 Pain in left hip: Secondary | ICD-10-CM

## 2017-09-14 DIAGNOSIS — M25561 Pain in right knee: Secondary | ICD-10-CM

## 2017-09-14 MED ORDER — AMPHETAMINE-DEXTROAMPHET ER 30 MG PO CP24: 30.0000 mg | ORAL_CAPSULE | ORAL | 0 refills | Status: DC

## 2017-09-14 NOTE — Progress Notes (Deleted)
Bilateral hip pain. Bending down causes most pain. Grabbing sensation in groin when rotating leg. Has to sleep with pillow between knees.  Left hip hurts the most today.  History of right hip fracture and surgery approximately 2 yrs ago.  Complains mostly of knee pain.  Seen by Dr. 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

## 2017-09-14 NOTE — Telephone Encounter (Signed)
LMTCB

## 2017-09-14 NOTE — Telephone Encounter (Signed)
Printed but post dated for 09/19/17

## 2017-09-14 NOTE — Telephone Encounter (Signed)
Not due until October 24 according to my records

## 2017-09-14 NOTE — Progress Notes (Addendum)
Jeanne Smith - 66 y.o. female MRN [id number]  Date of birth: [birth year]  Office Visit Note: Visit Date: 09/14/2017 PCP: Elby Showers, MD Referred by: Elby Showers, MD  Subjective: Chief Complaint  Patient presents with  . Right Hip - Pain  . 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  She has been recently seen by Dr. Durward Fortes for her bilateral hip and knee pain. She says actually the most problematic thing that she is having is her knee pain but they feel like this may be coming from her hips. She's had a prior right hip fracture with pinning. This was several years ago. She has a lot of pain with bending down and bending forward. She has pain with rotating the hip getting in and out of car. She does have some groin pain. She has no history of lumbar spine problems or radicular problems. We are going to complete a diagnostic and hopefully therapeutic anesthetic hip arthrogram on the right. She says today left is a little bit more bothersome but traditionally right side is been worse.    ROS Otherwise per HPI.  Assessment & Plan: Visit Diagnoses:  1. Pain in left hip   2. Pain in right hip   3. Chronic pain of left knee   4. Chronic pain of right knee     Plan: Findings:  Diagnostic and mildly therapeutic right hip anesthetic arthrogram with fluoroscopic guidance. The patient did have significant relief during the anesthetic phase of the injection including her knee pain. We'll see her back in a few weeks and complete the same procedure on the left.    Meds & Orders: No orders of the defined types were placed in this encounter.   Orders Placed This Encounter  Procedures  . Large Joint Injection/Arthrocentesis  . XR C-ARM NO REPORT    Follow-up: Return for Left hip anesthetic arthrogram.   Procedures: Anesthetic hip arthrogram with fluoroscopic guidance: R  hip joint Date/Time: 09/14/2017 10:53 AM Performed by: Magnus Sinning, MD Authorized by: Magnus Sinning, MD   Consent Given by:  Patient Site marked: the procedure site was marked   Timeout: prior to procedure the correct patient, procedure, and site was verified   Indications:  Pain and diagnostic evaluation Location:  Hip Site:  R hip joint Prep: patient was prepped and draped in usual sterile fashion   Needle Size:  22 G Approach:  Anterior Ultrasound Guidance: No   Fluoroscopic Guidance: No   Arthrogram: Yes   Medications:  3 mL bupivacaine 0.5 %; 80 mg triamcinolone acetonide 40 MG/ML Aspiration Attempted: Yes   Patient tolerance:  Patient tolerated the procedure well with no immediate complications  Arthrogram demonstrated excellent flow of contrast throughout the joint surface without extravasation or obvious defect.  The patient had relief of symptoms during the anesthetic phase of the injection.     No notes on file   Clinical History: No specialty comments available.  She reports that she quit smoking about 11 years ago. She has never used smokeless tobacco.   Recent Labs  10/10/16 1220 06/04/17 0909  HGBA1C 4.9 5.2    Objective:  VS:  HT:    WT:   BMI:     BP:   HR: bpm  TEMP: ( )  RESP:  Physical Exam  Musculoskeletal:  Pain going from  sit to stand pain with hip rotation. No pain along the joint line of the knees and no swelling of the knees. She has good stability of the knees bilaterally.    Ortho Exam Imaging: No results found.  Past Medical/Family/Surgical/Social History: Medications & Allergies reviewed per EMR Patient Active Problem List   Diagnosis Date Noted  . Intertrochanteric fracture of right femur ([facility]) 06/15/2015  . Comminuted right humeral fracture 06/15/2015  . HTN (hypertension) 06/15/2015   Past Medical History:  Diagnosis Date  . Arthritis    oa  . Attention deficit disorder   . H/O seasonal allergies   .  Hypertension    Family History  Problem Relation Age of Onset  . Hypertension Father   . Cancer Father   . Heart disease Brother    Past Surgical History:  Procedure Laterality Date  . ABDOMINAL HYSTERECTOMY    . FEMUR IM NAIL Right 06/16/2015   Procedure: INTRAMEDULLARY (IM) NAIL FEMORAL;  Surgeon: Renette Butters, MD;  Location: [facility];  Service: Orthopedics;  Laterality: Right;   Social History   Occupational History  . Not on file.   Social History Main Topics  . Smoking status: Former Smoker    Quit date: 11/27/2005  . Smokeless tobacco: Never Used  . Alcohol use Yes     Comment: wine almost daily   . Drug use: No  . Sexual activity: Not on file

## 2017-09-14 NOTE — Telephone Encounter (Signed)
Patient is calling to request refill on her Adderall.  She is fine with picking this up on Monday.  Thank you.

## 2017-09-17 MED ORDER — BUPIVACAINE HCL 0.5 % IJ SOLN
3.0000 mL | INTRAMUSCULAR | Status: AC | PRN
  Administered 2017-09-14: 3 mL via INTRA_ARTICULAR

## 2017-09-17 MED ORDER — TRIAMCINOLONE ACETONIDE 40 MG/ML IJ SUSP
80.0000 mg | INTRAMUSCULAR | Status: AC | PRN
  Administered 2017-09-14: 80 mg via INTRA_ARTICULAR

## 2017-09-28 ENCOUNTER — Encounter (INDEPENDENT_AMBULATORY_CARE_PROVIDER_SITE_OTHER): Payer: Self-pay | Admitting: Physical Medicine and Rehabilitation

## 2017-09-28 ENCOUNTER — Ambulatory Visit (INDEPENDENT_AMBULATORY_CARE_PROVIDER_SITE_OTHER): Payer: Medicare Other

## 2017-09-28 ENCOUNTER — Ambulatory Visit (INDEPENDENT_AMBULATORY_CARE_PROVIDER_SITE_OTHER): Payer: Medicare Other | Admitting: Physical Medicine and Rehabilitation

## 2017-09-28 DIAGNOSIS — M25552 Pain in left hip: Secondary | ICD-10-CM

## 2017-09-28 NOTE — Patient Instructions (Signed)

## 2017-09-28 NOTE — Progress Notes (Deleted)
Left hip and knee pain. Knee has been felling better recently. Right hip has ben feeling much better since injection.

## 2017-09-28 NOTE — Progress Notes (Signed)
Jeanne Smith - 66 y.o. female MRN [id number]  Date of birth: Feb 25, 1951  Office Visit Note: Visit Date: 09/28/2017 PCP: Elby Showers, MD Referred by: Elby Showers, MD  Subjective: Chief Complaint  Patient presents with  . Left Hip - Pain   HPI: Jeanne Smith is a 66 year old female with prior right hip fracture status post pinning.  Bilateral hip and groin pain for some time.  We recently completed right hip anesthetic arthrogram with really good relief of her symptoms.  We are going to complete the left-sided injection today.  In the future could possibly look at bilateral procedure for her.  She should continue to follow-up with Dr. Durward Fortes concerning hip replacement.    ROS Otherwise per HPI.  Assessment & Plan: Visit Diagnoses:  1. Pain in left hip     Plan: Findings:  Diagnostic and hopefully therapeutic left hip anesthetic arthrogram.  Patient did have relief during the anesthetic portion of the injection.    Meds & Orders: No orders of the defined types were placed in this encounter.   Orders Placed This Encounter  Procedures  . Large Joint Injection/Arthrocentesis  . XR C-ARM NO REPORT    Follow-up: Return if symptoms worsen or fail to improve, for Dr. Durward Fortes.   Procedures: Diagnostic and therapeutic anesthetic hip arthrogram Date/Time: 09/28/2017 10:48 AM Performed by: Magnus Sinning, MD Authorized by: Magnus Sinning, MD   Consent Given by:  Patient Site marked: the procedure site was marked   Timeout: prior to procedure the correct patient, procedure, and site was verified   Indications:  Pain and diagnostic evaluation Location:  Hip Site:  L hip joint Prep: patient was prepped and draped in usual sterile fashion   Needle Size:  22 G Approach:  Anterior Ultrasound Guidance: No   Fluoroscopic Guidance: No   Arthrogram: Yes   Medications:  3 mL bupivacaine 0.5 %; 80 mg triamcinolone acetonide 40 MG/ML Aspiration Attempted: Yes   Patient  tolerance:  Patient tolerated the procedure well with no immediate complications  Arthrogram demonstrated excellent flow of contrast throughout the joint surface without extravasation or obvious defect.  The patient had relief of symptoms during the anesthetic phase of the injection.     No notes on file   Clinical History: No specialty comments available.  She reports that she quit smoking about 11 years ago. she has never used smokeless tobacco.  Recent Labs    10/10/16 1220 06/04/17 0909  HGBA1C 4.9 5.2    Objective:  VS:  HT:    WT:   BMI:     BP:   HR: bpm  TEMP: ( )  RESP:  Physical Exam  Musculoskeletal:  The painful left hip range of motion.  Decreased range of motion and decreased pain of the right hip status post injection.    Ortho Exam Imaging: No results found.  Past Medical/Family/Surgical/Social History: Medications & Allergies reviewed per EMR Patient Active Problem List   Diagnosis Date Noted  . Intertrochanteric fracture of right femur ([place]) 06/15/2015  . Comminuted right humeral fracture 06/15/2015  . HTN (hypertension) 06/15/2015   Past Medical History:  Diagnosis Date  . Arthritis    oa  . Attention deficit disorder   . H/O seasonal allergies   . Hypertension    Family History  Problem Relation Age of Onset  . Hypertension Father   . Cancer Father   . Heart disease Brother    Past Surgical History:  Procedure Laterality  Date  . ABDOMINAL HYSTERECTOMY     Social History   Occupational History  . Not on file  Tobacco Use  . Smoking status: Former Smoker    Last attempt to quit: 11/27/2005    Years since quitting: 11.8  . Smokeless tobacco: Never Used  Substance and Sexual Activity  . Alcohol use: Yes    Comment: wine almost daily   . Drug use: No  . Sexual activity: Not on file

## 2017-10-02 MED ORDER — BUPIVACAINE HCL 0.5 % IJ SOLN
3.0000 mL | INTRAMUSCULAR | Status: AC | PRN
  Administered 2017-09-28: 3 mL via INTRA_ARTICULAR

## 2017-10-02 MED ORDER — TRIAMCINOLONE ACETONIDE 40 MG/ML IJ SUSP
80.0000 mg | INTRAMUSCULAR | Status: AC | PRN
  Administered 2017-09-28: 80 mg via INTRA_ARTICULAR

## 2017-10-13 ENCOUNTER — Other Ambulatory Visit: Payer: Self-pay | Admitting: Internal Medicine

## 2017-10-17 ENCOUNTER — Other Ambulatory Visit: Payer: Self-pay | Admitting: Internal Medicine

## 2017-10-17 NOTE — Telephone Encounter (Signed)
Refill once 

## 2017-10-22 ENCOUNTER — Telehealth: Payer: Self-pay | Admitting: Internal Medicine

## 2017-10-22 MED ORDER — AMPHETAMINE-DEXTROAMPHET ER 30 MG PO CP24: 30.0000 mg | ORAL_CAPSULE | ORAL | 0 refills | Status: DC

## 2017-10-22 NOTE — Telephone Encounter (Signed)
Patient is calling for a refill on her Adderall XR 30mg .  She is completely out at this point.    States that she has been running short on her Rx for the past couple of months.  Wanted to know if there was anything that we could do at the pharmacy to help her out with this issue.  Advised that we just write the Rx, we really do not have anything to do with the dispensing part of the drug.  Advised she should contact the pharmacy and speak with a Manager over the pharmacy regarding this issue.  Says that this month that it was filled on the 24th (31 pills).  On the 19th, she was OUT (her last pill should've been on the 24th).    Was 2 pills short last month. Says that she really didn't think much of it last month, but this month, this is quite a few to be missing.  She states that she is NOT taking more than 1 daily.    She was out of town and ran out on the 19th and was completely out of medication.  Patient will speak with the pharmacy regarding this issue.  She says that she paid for 31 and feels very strongly about this issue.    Please call patient when ready for pick up.   Thank you.  Best # for contact:  [phone]

## 2017-10-22 NOTE — Telephone Encounter (Signed)
Please give 31 days with no refill.

## 2017-10-22 NOTE — Telephone Encounter (Signed)
Printed rx  

## 2017-11-22 ENCOUNTER — Telehealth: Payer: Self-pay | Admitting: Internal Medicine

## 2017-11-22 MED ORDER — AMPHETAMINE-DEXTROAMPHET ER 30 MG PO CP24: 30.0000 mg | ORAL_CAPSULE | ORAL | 0 refills | Status: DC

## 2017-11-22 NOTE — Telephone Encounter (Signed)
Pt was notified.  

## 2017-11-22 NOTE — Telephone Encounter (Signed)
MEDICATION: amphetamine-dextroamphetamine (ADDERALL XR) 30 MG 24 hr capsule  PHARMACY:  Dr. Verlene Mayer office  IS THIS A 90 DAY SUPPLY : no  IS PATIENT OUT OF MEDICATION: no  IF NOT; HOW MUCH IS LEFT: 1 pill  LAST APPOINTMENT DATE: @9 /24/2018  NEXT APPOINTMENT DATE:@Visit  date not found  OTHER COMMENTS:    **Let patient know to contact pharmacy at the end of the day to make sure medication is ready. **  ** Please notify patient to allow 48-72 hours to process**  **Encourage patient to contact the pharmacy for refills or they can request refills through [facility]**

## 2017-11-22 NOTE — Telephone Encounter (Signed)
Give #31 with no refill. Pick up tomorrow.

## 2017-11-27 DIAGNOSIS — I499 Cardiac arrhythmia, unspecified: Secondary | ICD-10-CM

## 2017-11-27 HISTORY — DX: Cardiac arrhythmia, unspecified: I49.9

## 2017-12-06 ENCOUNTER — Telehealth (INDEPENDENT_AMBULATORY_CARE_PROVIDER_SITE_OTHER): Payer: Self-pay | Admitting: Physical Medicine and Rehabilitation

## 2017-12-07 NOTE — Telephone Encounter (Signed)
If they helped a lot hen ok x1 but in future if not lasting long will need OV with Durward Fortes

## 2017-12-07 NOTE — Telephone Encounter (Signed)
Scheduled for 1/21 and 1/28.

## 2017-12-10 ENCOUNTER — Encounter: Payer: Self-pay | Admitting: Internal Medicine

## 2017-12-10 ENCOUNTER — Telehealth: Payer: Self-pay | Admitting: Internal Medicine

## 2017-12-10 NOTE — Telephone Encounter (Signed)
Pt thinks we increased Losartan to 100 mg daily but notes do not indicate that we did. Is worried a bit about Losartan recall. She was supposed to follow up in October but then there was situational stress with her daughter. Spoke with pt today. Chart reviewed. Says her BP has been running high. Needs OV. Has found out she needs bilateral hip replacements. Will ask staff to schedule appt.

## 2017-12-12 NOTE — Telephone Encounter (Signed)
Spoke with patient; she advised that she thinks we forgot to update our records that her Losartan had been increased from 50mg  to 100mg .  I cannot find in our records where it has been increased to 100mg .  Provided appointment for Friday, 12/14/17 @ 11:45.  Patient confirmed.

## 2017-12-14 ENCOUNTER — Ambulatory Visit: Payer: Medicare Other | Admitting: Internal Medicine

## 2017-12-17 ENCOUNTER — Encounter (INDEPENDENT_AMBULATORY_CARE_PROVIDER_SITE_OTHER): Payer: Self-pay | Admitting: Physical Medicine and Rehabilitation

## 2017-12-17 ENCOUNTER — Ambulatory Visit (INDEPENDENT_AMBULATORY_CARE_PROVIDER_SITE_OTHER): Payer: Medicare Other

## 2017-12-17 ENCOUNTER — Ambulatory Visit (INDEPENDENT_AMBULATORY_CARE_PROVIDER_SITE_OTHER): Payer: Medicare Other | Admitting: Physical Medicine and Rehabilitation

## 2017-12-17 DIAGNOSIS — M25552 Pain in left hip: Secondary | ICD-10-CM | POA: Diagnosis not present

## 2017-12-17 NOTE — Progress Notes (Signed)
Left hip pain groin area with radiating pain to left knee.  Has had previous injections, only one left hip injection. Left hip injection did well 2 weeks, pain slowly increased. Constant pain throughout day, difficulty sleeping due to pain.  Has had previous right hip injections and have worked well.  No contrast allergy. No blood thinners. 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

## 2017-12-17 NOTE — Progress Notes (Signed)
REILEY BERTAGNOLLI - 67 y.o. female MRN [id number]  Date of birth: Oct 14, 1951  Office Visit Note: Visit Date: 12/17/2017 PCP: Elby Showers, MD Referred by: Elby Showers, MD  Subjective: Chief Complaint  Patient presents with  . Left Hip - Pain   HPI: Ms. Nuzzo is a 67 year old female that we have completed 2 diagnostic and therapeutic anesthetic hip arthrograms last year, one on each side.  The right side is where she has had a prior fracture and fixation and this is actually done quite well she is still doing better from that injection.  The left side however did get relief but the symptoms have returned.  She has pretty severe end-stage arthritis on the left.  We will go ahead today and completed intra-articular injection with fluoroscopic guidance.  She will follow-up with Dr. Durward Fortes depending on relief.  She is likely looking at hip replacement at some point.    ROS Otherwise per HPI.  Assessment & Plan: Visit Diagnoses:  1. Pain in left hip     Plan: Findings:  Diagnostic and hopefully therapeutic intra-articular hip injection with fluoroscopic guidance on the left.  Patient did have significant relief during the anesthetic phase.    Meds & Orders: No orders of the defined types were placed in this encounter.   Orders Placed This Encounter  Procedures  . Large Joint Inj: L hip joint  . XR C-ARM NO REPORT    Follow-up: Return if symptoms worsen or fail to improve.   Procedures: Large Joint Inj: L hip joint on 12/17/2017 9:27 AM Indications: diagnostic evaluation and pain Details: 22 G 3.5 in needle, fluoroscopy-guided anterior approach  Arthrogram: No  Medications: 80 mg triamcinolone acetonide 40 MG/ML; 3 mL bupivacaine 0.5 % Outcome: tolerated well, no immediate complications  There was excellent flow of contrast producing a partial arthrogram of the hip. The patient did have relief of symptoms during the anesthetic phase of the injection. Procedure, treatment  alternatives, risks and benefits explained, specific risks discussed. Consent was given by the patient. Immediately prior to procedure a time out was called to verify the correct patient, procedure, equipment, support staff and site/side marked as required. Patient was prepped and draped in the usual sterile fashion.      No notes on file   Clinical History: Intra-articular Left  hip injection  09/28/2017  Intra-articular Right hip injection 09/14/2017  She reports that she quit smoking about 12 years ago. she has never used smokeless tobacco.  Recent Labs    06/04/17 0909  HGBA1C 5.2    Objective:  VS:  HT:    WT:   BMI:     BP:   HR: bpm  TEMP: ( )  RESP:  Physical Exam  Musculoskeletal:  Decreased range of motion of the left hip with rotation and concordant pain.    Ortho Exam Imaging: Xr C-arm No Report  Result Date: 12/17/2017 Please see Notes or Procedures tab for imaging impression.   Past Medical/Family/Surgical/Social History: Medications & Allergies reviewed per EMR Patient Active Problem List   Diagnosis Date Noted  . Intertrochanteric fracture of right femur ([place]) 06/15/2015  . Comminuted right humeral fracture 06/15/2015  . HTN (hypertension) 06/15/2015   Past Medical History:  Diagnosis Date  . Arthritis    oa  . Attention deficit disorder   . H/O seasonal allergies   . Hypertension    Family History  Problem Relation Age of Onset  . Hypertension Father   .  Cancer Father   . Heart disease Brother    Past Surgical History:  Procedure Laterality Date  . ABDOMINAL HYSTERECTOMY    . FEMUR IM NAIL Right 06/16/2015   Procedure: INTRAMEDULLARY (IM) NAIL FEMORAL;  Surgeon: Renette Butters, MD;  Location: [facility];  Service: Orthopedics;  Laterality: Right;   Social History   Occupational History  . Not on file  Tobacco Use  . Smoking status: Former Smoker    Last attempt to quit: 11/27/2005    Years since quitting: 12.0  . Smokeless tobacco:  Never Used  Substance and Sexual Activity  . Alcohol use: Yes    Comment: wine almost daily   . Drug use: No  . Sexual activity: Not on file

## 2017-12-17 NOTE — Patient Instructions (Signed)

## 2017-12-18 MED ORDER — TRIAMCINOLONE ACETONIDE 40 MG/ML IJ SUSP
80.0000 mg | INTRAMUSCULAR | Status: AC | PRN
  Administered 2017-12-17: 80 mg via INTRA_ARTICULAR

## 2017-12-18 MED ORDER — BUPIVACAINE HCL 0.5 % IJ SOLN
3.0000 mL | INTRAMUSCULAR | Status: AC | PRN
  Administered 2017-12-17: 3 mL via INTRA_ARTICULAR

## 2017-12-24 ENCOUNTER — Ambulatory Visit (INDEPENDENT_AMBULATORY_CARE_PROVIDER_SITE_OTHER): Payer: Medicare Other | Admitting: Internal Medicine

## 2017-12-24 ENCOUNTER — Other Ambulatory Visit: Payer: Self-pay | Admitting: Internal Medicine

## 2017-12-24 ENCOUNTER — Encounter: Payer: Self-pay | Admitting: Internal Medicine

## 2017-12-24 ENCOUNTER — Ambulatory Visit (INDEPENDENT_AMBULATORY_CARE_PROVIDER_SITE_OTHER): Payer: Medicare Other | Admitting: Physical Medicine and Rehabilitation

## 2017-12-24 VITALS — BP 162/98 | HR 111 | Ht 64.0 in | Wt 140.0 lb

## 2017-12-24 DIAGNOSIS — F419 Anxiety disorder, unspecified: Secondary | ICD-10-CM | POA: Diagnosis not present

## 2017-12-24 DIAGNOSIS — F908 Attention-deficit hyperactivity disorder, other type: Secondary | ICD-10-CM

## 2017-12-24 DIAGNOSIS — I10 Essential (primary) hypertension: Secondary | ICD-10-CM

## 2017-12-24 DIAGNOSIS — R079 Chest pain, unspecified: Secondary | ICD-10-CM

## 2017-12-24 DIAGNOSIS — R030 Elevated blood-pressure reading, without diagnosis of hypertension: Secondary | ICD-10-CM | POA: Diagnosis not present

## 2017-12-24 DIAGNOSIS — E7800 Pure hypercholesterolemia, unspecified: Secondary | ICD-10-CM | POA: Diagnosis not present

## 2017-12-24 DIAGNOSIS — F5102 Adjustment insomnia: Secondary | ICD-10-CM | POA: Diagnosis not present

## 2017-12-24 MED ORDER — OLMESARTAN MEDOXOMIL 20 MG PO TABS: 20.0000 mg | ORAL_TABLET | Freq: Every day | ORAL | 0 refills | Status: DC

## 2017-12-24 MED ORDER — AMPHETAMINE-DEXTROAMPHET ER 30 MG PO CP24: 30.0000 mg | ORAL_CAPSULE | ORAL | 0 refills | Status: DC

## 2017-12-24 NOTE — Progress Notes (Signed)
   Subjective:    Patient ID: Jeanne Smith, female    DOB: 1951/04/29, 67 y.o.   MRN: [id number]  HPI She says she ran out of metoprolol recently.  However prior to that her blood pressure was elevated.  She has been concerned.  She has had situational stress.  Her daughter's engagement was broken by her fianc back in October.  This is been stressful.  Her son who has a history of alcohol abuse apparently has been drinking recently once again.  She has been busy trying to settle her husband's estate but it has taken a lot of time.  One evening she was in bed and had with some substernal chest pain that radiated into her neck and down her arm.  She felt that her heart was pounding a bit.  She did not go to the emergency department.  Symptoms improved and she has had no recurrence. In July she had fasting lipid panel and total cholesterol was 272 with LDL cholesterol of 156.  She has not wanted to be on statin therapy.  Previously was on metoprolol XL 100 mg every 24 hours in addition to losartan 50 mg daily.  She does take Adderall for attention deficit disorder.  She has apparently osteoarthritis in both hips and is looking at hip replacement surgery.  Review of Systems see above     Objective:   Physical Exam Spent about 20 minutes speaking with her today about these issues.  She would like to see Cardiologist for evaluation.  We will arrange that.   she prefers Dr. Pernell Dupre.  I am changing losartan to Benicar 20 mg daily.  I have not refilled her metoprolol.  I want to follow-up with her again in approximately 10 days.       Assessment & Plan:  Elevated blood pressure  Situational stress  Hyperlipidemia  Anxiety  Insomnia  Attention deficit disorder-treated with Adderall  Recent episode of chest pain at rest  Bilateral osteoarthritis of hips  Plan: Change losartan to Benicar 20 mg daily.  Follow-up in 2 weeks.  Hold metoprolol for now.  Cardiology evaluation.

## 2017-12-24 NOTE — Telephone Encounter (Signed)
I do not want 90 day supply. We may need to adjust it

## 2017-12-24 NOTE — Patient Instructions (Addendum)
Change  losartan to Benicar and follow-up in a couple of weeks.  Cardiology evaluation to be obtained.  Says she is facing hip surgery for bilateral osteoarthritis of both hips.  Adderall refilled at her request but need to monitor blood pressure.

## 2018-01-17 ENCOUNTER — Other Ambulatory Visit: Payer: Self-pay | Admitting: Internal Medicine

## 2018-01-18 NOTE — Telephone Encounter (Signed)
In January she was changed to [employer] and was told to follow up in 10 days. Now we have refill request. Please call her and see what is going on. If she is out, restart and f/u here in 2 weeks.

## 2018-01-22 ENCOUNTER — Telehealth: Payer: Self-pay | Admitting: Internal Medicine

## 2018-01-22 DIAGNOSIS — M25559 Pain in unspecified hip: Secondary | ICD-10-CM

## 2018-01-22 NOTE — Telephone Encounter (Signed)
Can you send to her pharmacy please.  She has appointment for 3/12 @ 2:45.  Thanks.

## 2018-01-22 NOTE — Telephone Encounter (Signed)
LM for patient to call me r/t refill request for medication.

## 2018-01-22 NOTE — Telephone Encounter (Signed)
Patient called back, took her last pill yesterday.  She isn't sure 20mg  will be enough.  Made appointment for 3/12 @ 2:45.

## 2018-01-22 NOTE — Telephone Encounter (Signed)
Call in Tramadol  50 mg twice a day #60 with no refill. Stay on Benicar 20 mg daily til seen.

## 2018-01-22 NOTE — Telephone Encounter (Signed)
Called in to [facility].  Called patient to advise.    Araceli, will you put this in Epic please?

## 2018-01-22 NOTE — Telephone Encounter (Signed)
Patient is requesting refill on her Ambien please.    She made appointment for 3/12 @ 2:45 to f/u on the Benicar.  She doesn't feel 20mg  may be enough.  Can't get in to see Dr. Tamala Julian until 5/2.  She said she has bad hips and has to have them replaced and has referred pain to the knees and has been taking ibuprofen for the pain (per the orthopedist).  Has allergies as well and has been using Afrin for that.  A few weeks ago she read the bottles and noticed that it said it was contraindicated to use ibuprofen and afrin for individuals who have hypertension or use hypertensive medications.  So, she has stopped using both ibuprofen as well as the afrin.  She is using just saline for the allergies but is not taking anything for the pain in her hips/knees.  She wants to know if you have any suggestions for her as to what she can take for the pain that would not be contraindicated with the Benicar?    Since she stopped taking the ibuprofen, she hasn't had any more episodes of chest pain, etc.  She feels she may have still had a few more palpitations.  She just wanted to pass that along.    Pharmacy:  [facility] :  [place].    Thank you.    Phone:  [phone]

## 2018-01-23 MED ORDER — ZOLPIDEM TARTRATE 10 MG PO TABS: 10.0000 mg | ORAL_TABLET | Freq: Every evening | ORAL | 0 refills | Status: DC | PRN

## 2018-01-23 MED ORDER — TRAMADOL HCL 50 MG PO TABS: 50.0000 mg | ORAL_TABLET | Freq: Two times a day (BID) | ORAL | 0 refills | Status: DC

## 2018-02-05 ENCOUNTER — Encounter: Payer: Self-pay | Admitting: Internal Medicine

## 2018-02-05 ENCOUNTER — Other Ambulatory Visit: Payer: Self-pay | Admitting: Internal Medicine

## 2018-02-05 ENCOUNTER — Ambulatory Visit (INDEPENDENT_AMBULATORY_CARE_PROVIDER_SITE_OTHER): Payer: Medicare Other | Admitting: Internal Medicine

## 2018-02-05 VITALS — BP 142/90 | HR 105 | Temp 97.9°F | Ht 64.0 in | Wt 140.0 lb

## 2018-02-05 DIAGNOSIS — F900 Attention-deficit hyperactivity disorder, predominantly inattentive type: Secondary | ICD-10-CM | POA: Diagnosis not present

## 2018-02-05 DIAGNOSIS — I10 Essential (primary) hypertension: Secondary | ICD-10-CM

## 2018-02-05 DIAGNOSIS — R079 Chest pain, unspecified: Secondary | ICD-10-CM | POA: Diagnosis not present

## 2018-02-05 DIAGNOSIS — F439 Reaction to severe stress, unspecified: Secondary | ICD-10-CM | POA: Diagnosis not present

## 2018-02-05 MED ORDER — OLMESARTAN MEDOXOMIL 40 MG PO TABS: 40.0000 mg | ORAL_TABLET | Freq: Every day | ORAL | 1 refills | Status: DC

## 2018-02-05 NOTE — Progress Notes (Signed)
   Subjective:    Patient ID: Jeanne Smith, female    DOB: 10-12-1951, 67 y.o.   MRN: [id number]  HPI 67 year old Female who recently had an episode of chest pain.   She needs to have bilateral hip arthroplasties in the near future.  She is having a considerable amount of pain.  She is asking about tramadol and we will give her a prescription for that today.  She has a history of attention deficit disorder and I gave her Adderall for that.  Her blood pressures been elevated recently but she has a lot of things going on.  Recently placed her on Benicar 20 mg daily.  Blood pressure remains elevated and we are going to increase that to 40 mg daily.  She continues to deal with her husband's estate.  He has a lot of things that needed to be inventoried in a storage unit.  Her daughter is been helping her with that.  She is a former pediatric ICU nurse.  She feels that she needs cardiac evaluation and I would agree with that.  Dr. Pernell Dupre is her regular cardiologist but we cannot get an appointment with him until May and she would like to try to get hip surgery done sooner than that.  Therefore, we have arranged for her to see Dr. Peter Martinique  tomorrow.  She says she tried not taking Adderall and her blood pressure was still elevated whether she took it or not.  She is also on metoprolol 100 mg daily.  I am surprised that pulse is 105 on the beta-blocker if she is compliant with it.  We have agreed we will increase Benicar to 40 mg daily.  She will take that beginning tomorrow.  Review of Systems     Objective:   Physical Exam Blood pressure is 142/90.  Pulse is 105.  She is a bit anxious.  Chest clear.  Cardiac exam regular rate and rhythm.  Extremities without edema.       Assessment & Plan:  Elevated blood pressure  Essential hypertension  Attention deficit disorder  Situational stress  Recent episode of chest pain  Bilateral osteoarthritis of the hips needing bilateral  arthroplasties  Plan: To see Cardiologist for further evaluation tomorrow.  Increase Benicar 40 mg daily.  Continue metoprolol.

## 2018-02-06 ENCOUNTER — Telehealth: Payer: Self-pay | Admitting: Internal Medicine

## 2018-02-06 ENCOUNTER — Telehealth: Payer: Self-pay

## 2018-02-06 ENCOUNTER — Ambulatory Visit (INDEPENDENT_AMBULATORY_CARE_PROVIDER_SITE_OTHER): Payer: Medicare Other | Admitting: Cardiology

## 2018-02-06 ENCOUNTER — Encounter: Payer: Self-pay | Admitting: Cardiology

## 2018-02-06 VITALS — BP 142/104 | HR 97 | Ht 64.0 in | Wt 139.0 lb

## 2018-02-06 DIAGNOSIS — I10 Essential (primary) hypertension: Secondary | ICD-10-CM | POA: Diagnosis not present

## 2018-02-06 DIAGNOSIS — R079 Chest pain, unspecified: Secondary | ICD-10-CM

## 2018-02-06 DIAGNOSIS — E7800 Pure hypercholesterolemia, unspecified: Secondary | ICD-10-CM

## 2018-02-06 DIAGNOSIS — R002 Palpitations: Secondary | ICD-10-CM | POA: Diagnosis not present

## 2018-02-06 DIAGNOSIS — I517 Cardiomegaly: Secondary | ICD-10-CM | POA: Diagnosis not present

## 2018-02-06 MED ORDER — AMPHETAMINE-DEXTROAMPHET ER 30 MG PO CP24: 30.0000 mg | ORAL_CAPSULE | ORAL | 0 refills | Status: DC

## 2018-02-06 MED ORDER — ROSUVASTATIN CALCIUM 10 MG PO TABS: 10.0000 mg | ORAL_TABLET | Freq: Every day | ORAL | 3 refills | Status: DC

## 2018-02-06 NOTE — Telephone Encounter (Signed)
error 

## 2018-02-06 NOTE — Patient Instructions (Signed)
Increase Benicar to 40 mg daily.  See Dr. Myriam Jacobson tomorrow for evaluation of chest pain and blood pressure.

## 2018-02-06 NOTE — Telephone Encounter (Signed)
rx was printed

## 2018-02-06 NOTE — Progress Notes (Signed)
Cardiology Office Note   Date:  02/06/2018   ID:  Jeanne Smith, Jeanne Smith January 18, 1951, MRN [id number]  PCP:  Elby Showers, MD  Cardiologist:   Indea Dearman Martinique, MD   Chief Complaint  Patient presents with  . Chest Pain  . Hypertension  . Palpitations      History of Present Illness: Jeanne Smith is a 67 y.o. female who is seen at the request of Dr. Renold Genta for evaluation of chest pain. She has a history of HTN and HLD. She also needs pre op clearance for bilateral THR.   She reports that 3-4 weeks ago at 11 pm she developed sudden tachycardia and palpitations. This was followed by substernal chest pain that radiated into her left arm, neck, and jaw. She took a metoprolol and symptoms resolved after about an hour. She notes that she was having  sinus allergies and was using a lot of Afrin. She also notes that her BP medication was changed prior to this. She was taking metoprolol 100 mg and losartan and was changed to Olmesartan and the metoprolol was stopped. She does take Adderall infrequently. She is under a lot of stress with her husband passing away this past year and she is handling the estate. With her hip problems she has been very inactive and has gained 20 lbs. She denies any prior cardiac evaluation.     Past Medical History:  Diagnosis Date  . Arthritis    oa  . Attention deficit disorder   . H/O seasonal allergies   . Hyperlipidemia   . Hypertension   . Osteoarthritis     Past Surgical History:  Procedure Laterality Date  . ABDOMINAL HYSTERECTOMY    . FEMUR IM NAIL Right 06/16/2015   Procedure: INTRAMEDULLARY (IM) NAIL FEMORAL;  Surgeon: Renette Butters, MD;  Location: [facility];  Service: Orthopedics;  Laterality: Right;     Current Outpatient Medications  Medication Sig Dispense Refill  . amphetamine-dextroamphetamine (ADDERALL XR) 30 MG 24 hr capsule Take 1 capsule (30 mg total) by mouth every morning. 31 capsule 0  . olmesartan (BENICAR) 40 MG tablet TAKE 1  TABLET(40 MG) BY MOUTH DAILY 90 tablet 1  . PREMARIN 0.625 MG tablet 0.625 mg.  11  . traMADol (ULTRAM) 50 MG tablet Take 1 tablet (50 mg total) by mouth 2 (two) times daily. 30 tablet 0  . zolpidem (AMBIEN) 10 MG tablet Take 1 tablet (10 mg total) by mouth at bedtime as needed. for sleep 90 tablet 0  . rosuvastatin (CRESTOR) 10 MG tablet Take 1 tablet (10 mg total) by mouth daily. 90 tablet 3   No current facility-administered medications for this visit.     Allergies:   Patient has no known allergies.    Social History:  The patient  reports that she quit smoking about 12 years ago. she has never used smokeless tobacco. She reports that she drinks alcohol. She reports that she does not use drugs.   Family History:  The patient's family history includes Alcoholism in her brother; Cancer in her father; Heart disease in her brother; Heart failure in her mother; Hypertension in her father; Stroke in her father.    ROS:  Please see the history of present illness.   Otherwise, review of systems are positive for none.   All other systems are reviewed and negative.    PHYSICAL EXAM: VS:  BP (!) 142/104   Pulse 97   Ht 5\' 4"  (1.626 m)  Wt 139 lb (63 kg)   BMI 23.86 kg/m  , BMI Body mass index is 23.86 kg/m. GEN: Well nourished, well developed, in no acute distress  HEENT: normal  Neck: no JVD, carotid bruits, or masses Cardiac: RRR; no murmurs, rubs, or gallops,no edema  Respiratory:  clear to auscultation bilaterally, normal work of breathing GI: soft, nontender, nondistended, + BS MS: no deformity or atrophy  Skin: warm and dry, no rash Neuro:  Strength and sensation are intact Psych: euthymic mood, full affect   EKG:  EKG is ordered today. The ekg ordered today demonstrates NSR with rate 97. Normal Ecg. I have personally reviewed and interpreted this study.    Recent Labs: 06/04/2017: ALT 51; BUN 11; Creat 0.69; Hemoglobin 15.7; Platelets 205; Potassium 4.8; Sodium 138; TSH  0.63    Lipid Panel    Component Value Date/Time   CHOL 272 (H) 06/04/2017 0909   TRIG 123 06/04/2017 0909   HDL 91 06/04/2017 0909   CHOLHDL 3.0 06/04/2017 0909   VLDL 25 06/04/2017 0909   LDLCALC 156 (H) 06/04/2017 0909      Wt Readings from Last 3 Encounters:  02/06/18 139 lb (63 kg)  02/05/18 140 lb (63.5 kg)  12/24/17 140 lb (63.5 kg)      Other studies Reviewed: Additional studies/ records that were reviewed today include: none Review of the above records demonstrates: N/A   ASSESSMENT AND PLAN:  1.  Chest pain. Symptoms consistent with angina. Only one episode of pain lasting one hour. No recurrence. Confounding issues include stopping of metoprolol prior to event without taper and use of Afrin. It is possible chest pain could be related to arrhythmia at the time. She does have risk factors of HTN and HLD. Recommend Lexiscan myoview to evaluate especially since she needs preop clearance for THR.   2. HTN BP poorly controlled. Will continue Olmesartan 40 mg daily. Start Bystolic 10 mg daily. Samples given.   3. Palpitations. May be related to factors noted above. For now will see how she responds with Beta blocker therapy and BP control. If palpitations recur will consider an event monitor.   4. Hypercholesterolemia. Given other risk factors would recommend statin therapy. Will start Crestor 10 mg daily  5. Osteoarthritis of hips.    Current medicines are reviewed at length with the patient today.  The patient does not have concerns regarding medicines.  The following changes have been made:  See above  Labs/ tests ordered today include:   Orders Placed This Encounter  Procedures  . MYOCARDIAL PERFUSION IMAGING  . EKG 12-Lead     Disposition:   FU with me in 3 weeks  Signed, Idell Hissong Martinique, MD  02/06/2018 4:52 PM    [facility] [address] [place], [place], [address] Phone [phone], Fax [phone]

## 2018-02-06 NOTE — Telephone Encounter (Signed)
Patient is calling to request refill on her Adderall XR 30 mg.  She forgot to request this yesterday while here for her OV.  States she can wait and pick it up after lunch tomorrow.    Thank you.

## 2018-02-06 NOTE — Patient Instructions (Signed)
We will schedule you for a nuclear stress test  We will start Bystolic 10 mg daily for blood pressure  Start Crestor 10 mg daily for cholesterol.   We will follow up in 3 weeks.

## 2018-02-07 ENCOUNTER — Ambulatory Visit (INDEPENDENT_AMBULATORY_CARE_PROVIDER_SITE_OTHER): Payer: Medicare Other | Admitting: Internal Medicine

## 2018-02-07 VITALS — BP 160/100 | HR 90 | Temp 98.1°F

## 2018-02-07 DIAGNOSIS — Z23 Encounter for immunization: Secondary | ICD-10-CM

## 2018-02-07 NOTE — Progress Notes (Signed)
Prevnar 13 given

## 2018-02-07 NOTE — Patient Instructions (Signed)
Prevnar 13 given

## 2018-02-08 ENCOUNTER — Telehealth (HOSPITAL_COMMUNITY): Payer: Self-pay

## 2018-02-08 NOTE — Telephone Encounter (Signed)
Encounter complete. 

## 2018-02-11 ENCOUNTER — Other Ambulatory Visit: Payer: Self-pay

## 2018-02-11 MED ORDER — AMPHETAMINE-DEXTROAMPHETAMINE 30 MG PO TABS: 30.0000 mg | ORAL_TABLET | Freq: Two times a day (BID) | ORAL | 0 refills | Status: DC

## 2018-02-12 ENCOUNTER — Telehealth (HOSPITAL_COMMUNITY): Payer: Self-pay

## 2018-02-12 NOTE — Telephone Encounter (Signed)
Encounter complete. 

## 2018-02-13 ENCOUNTER — Ambulatory Visit (HOSPITAL_COMMUNITY)
Admission: RE | Admit: 2018-02-13 | Discharge: 2018-02-13 | Disposition: A | Discharge status: Home / Self Care | Payer: Medicare Other | Source: Ambulatory Visit | Attending: Cardiovascular Disease | Admitting: Cardiovascular Disease

## 2018-02-13 DIAGNOSIS — R079 Chest pain, unspecified: Secondary | ICD-10-CM | POA: Diagnosis not present

## 2018-02-13 DIAGNOSIS — R002 Palpitations: Secondary | ICD-10-CM | POA: Diagnosis not present

## 2018-02-13 MED ORDER — REGADENOSON 0.4 MG/5ML IV SOLN
0.4000 mg | Freq: Once | INTRAVENOUS | Status: AC
  Administered 2018-02-13: 0.4 mg via INTRAVENOUS

## 2018-02-13 MED ORDER — TECHNETIUM TC 99M TETROFOSMIN IV KIT
9.7000 | PACK | Freq: Once | INTRAVENOUS | Status: AC | PRN
  Administered 2018-02-13: 9.7 via INTRAVENOUS
  Filled 2018-02-13: qty 10

## 2018-02-13 MED ORDER — TECHNETIUM TC 99M TETROFOSMIN IV KIT
30.1000 | PACK | Freq: Once | INTRAVENOUS | Status: AC | PRN
  Administered 2018-02-13: 30.1 via INTRAVENOUS
  Filled 2018-02-13: qty 31

## 2018-02-14 LAB — MYOCARDIAL PERFUSION IMAGING
CHL CUP NUCLEAR SRS: 1
CHL CUP NUCLEAR SSS: 4
LV dias vol: 72 mL (ref 46–106)
LVSYSVOL: 24 mL
Peak HR: 94 {beats}/min
Rest HR: 71 {beats}/min
SDS: 3
TID: 1.18

## 2018-02-18 ENCOUNTER — Telehealth: Payer: Self-pay | Admitting: Internal Medicine

## 2018-02-18 NOTE — Telephone Encounter (Signed)
Patient called stating that her current dose of Tramadol (50 mg BID) does not seem to be helping. She said that during the day she using a heating pad and tries to keep moving to help with the pain but at night it keeps her up. She wanted to know if the dose could be changed to help with this. She passed her stress test and is now waiting on an appointment with the orthopedic surgeon for hip surgery.

## 2018-02-18 NOTE — Telephone Encounter (Signed)
I have explained to her that there is no higher dose of Tramadol. Please have her call orthopedist for options for pain control.

## 2018-02-19 NOTE — Telephone Encounter (Signed)
Pt was notified of Dr. Baxley's instructions, pt verbalized understanding.   

## 2018-02-25 ENCOUNTER — Telehealth (INDEPENDENT_AMBULATORY_CARE_PROVIDER_SITE_OTHER): Payer: Self-pay | Admitting: Physical Medicine and Rehabilitation

## 2018-02-25 NOTE — Telephone Encounter (Signed)
If they have been very beneficial for more than just a few weeks then okay to repeat otherwise yes she needs to follow-up with Dr. Durward Fortes.  If we do repeat she is continue to follow-up with him before we do any more.

## 2018-02-26 ENCOUNTER — Telehealth (INDEPENDENT_AMBULATORY_CARE_PROVIDER_SITE_OTHER): Payer: Self-pay | Admitting: Orthopaedic Surgery

## 2018-02-26 NOTE — Telephone Encounter (Signed)
Patient left a voicemail requesting office visit notes and a copy of her xrays from her appointment on 08/28/17.   Please call patient when they are ready to be picked up.

## 2018-02-26 NOTE — Telephone Encounter (Signed)
Scheduled for repeat right hip injection. She got good relief from the last right hip injection, and this lasted for a few months. She only got about 30% relief from the left hip injection in January, so I scheduled her for a 30 minute injection appointment to discuss possible repeat of the left with Dr. Ernestina Patches.

## 2018-02-26 NOTE — Telephone Encounter (Signed)
I called patient, CD and office notes at front desk.

## 2018-03-08 ENCOUNTER — Encounter (INDEPENDENT_AMBULATORY_CARE_PROVIDER_SITE_OTHER): Payer: Self-pay | Admitting: Physical Medicine and Rehabilitation

## 2018-03-08 ENCOUNTER — Ambulatory Visit (INDEPENDENT_AMBULATORY_CARE_PROVIDER_SITE_OTHER): Payer: Medicare Other

## 2018-03-08 ENCOUNTER — Ambulatory Visit (INDEPENDENT_AMBULATORY_CARE_PROVIDER_SITE_OTHER): Payer: Medicare Other | Admitting: Physical Medicine and Rehabilitation

## 2018-03-08 DIAGNOSIS — M25551 Pain in right hip: Secondary | ICD-10-CM

## 2018-03-08 DIAGNOSIS — M25552 Pain in left hip: Secondary | ICD-10-CM

## 2018-03-08 NOTE — Progress Notes (Signed)
Jeanne Smith - 67 y.o. female MRN [id number]  Date of birth: 1951/02/11  Office Visit Note: Visit Date: 03/08/2018 PCP: Elby Showers, MD Referred by: Elby Showers, MD  Subjective: Chief Complaint  Patient presents with  . Right Hip - Pain  . Left Hip - Pain   HPI: Jeanne Smith is a 67 year old female well-known to [place] at this point with history of bilateral hip pain and degenerative arthritis of the hips.  She has had a prior right intertrochanteric fracture with fixation.  Last injection performed did not help as long as she would like.  She is seeking consultation with Dr. Alvan Dame at [facility].  We will complete repeat injection today from a diagnostic and hopefully therapeutic standpoint.  She is having pain in both the right and left hip and groin.  She has had pain for several weeks and worsening and really limiting what she can do.   ROS Otherwise per HPI.  Assessment & Plan: Visit Diagnoses:  1. Pain in right hip   2. Pain in left hip     Plan: No additional findings.   Meds & Orders: No orders of the defined types were placed in this encounter.   Orders Placed This Encounter  Procedures  . Large Joint Inj: L hip joint  . XR C-ARM NO REPORT    Follow-up: Return if symptoms worsen or fail to improve.   Procedures: Large Joint Inj: L hip joint on 03/08/2018 9:32 AM Indications: diagnostic evaluation and pain Details: 22 G 3.5 in needle, fluoroscopy-guided anterior approach  Arthrogram: No  Medications: 3 mL bupivacaine 0.5 %; 80 mg triamcinolone acetonide 40 MG/ML Outcome: tolerated well, no immediate complications  There was excellent flow of contrast producing a partial arthrogram of the hip. The patient did have relief of symptoms during the anesthetic phase of the injection. Procedure, treatment alternatives, risks and benefits explained, specific risks discussed. Consent was given by the patient. Immediately prior to procedure a time out was called to  verify the correct patient, procedure, equipment, support staff and site/side marked as required. Patient was prepped and draped in the usual sterile fashion.      No notes on file   Clinical History: Intra-articular Left  hip injection  09/28/2017  Intra-articular Right hip injection 09/14/2017   She reports that she quit smoking about 12 years ago. She has never used smokeless tobacco.  Recent Labs    06/04/17 0909  HGBA1C 5.2    Objective:  VS:  HT:    WT:   BMI:     BP:   HR: bpm  TEMP: ( )  RESP:  Physical Exam  Musculoskeletal:  Patient is slow to rise from seated position does have pain with hip rotation and limited range of motion particularly on the left.    Ortho Exam Imaging: No results found.  Past Medical/Family/Surgical/Social History: Medications & Allergies reviewed per EMR, new medications updated. Patient Active Problem List   Diagnosis Date Noted  . Pure hypercholesterolemia 12/24/2017  . Intertrochanteric fracture of right femur ([place]) 06/15/2015  . Comminuted right humeral fracture 06/15/2015  . HTN (hypertension) 06/15/2015   Past Medical History:  Diagnosis Date  . Arthritis    oa  . Attention deficit disorder   . H/O seasonal allergies   . Hyperlipidemia   . Hypertension   . Osteoarthritis    Family History  Problem Relation Age of Onset  . Heart failure Mother   . Hypertension Father   .  Cancer Father   . Stroke Father   . Heart disease Brother   . Alcoholism Brother    Past Surgical History:  Procedure Laterality Date  . ABDOMINAL HYSTERECTOMY    . FEMUR IM NAIL Right 06/16/2015   Procedure: INTRAMEDULLARY (IM) NAIL FEMORAL;  Surgeon: Renette Butters, MD;  Location: [facility];  Service: Orthopedics;  Laterality: Right;   Social History   Occupational History  . Not on file  Tobacco Use  . Smoking status: Former Smoker    Last attempt to quit: 11/27/2005    Years since quitting: 12.2  . Smokeless tobacco: Never Used    Substance and Sexual Activity  . Alcohol use: Yes    Comment: wine almost daily   . Drug use: No  . Sexual activity: Not on file

## 2018-03-08 NOTE — Patient Instructions (Signed)

## 2018-03-08 NOTE — Progress Notes (Signed)
 .  Numeric Pain Rating Scale and Functional Assessment Average Pain 7   In the last MONTH (on 0-10 scale) has pain interfered with the following?  1. General activity like being  able to carry out your everyday physical activities such as walking, climbing stairs, carrying groceries, or moving a chair?  Rating(4)   +Driver, -BT, -Dye Allergies.  

## 2018-03-10 NOTE — Progress Notes (Signed)
Cardiology Office Note   Date:  03/12/2018   ID:  Jailyn, Leeson [birth year], MRN [id number]  PCP:  Elby Showers, MD  Cardiologist:   Coley Littles Martinique, MD   Chief Complaint  Patient presents with  . Hypertension  . Hyperlipidemia      History of Present Illness: Jeanne Smith is a 67 y.o. female who is seen for follow up of chest pain, uncontrolled HTN, and palpitations. She has a history of HTN and HLD. She also needs pre op clearance for bilateral THR.   After her last visit she was scheduled for a Nuclear stress test that was normal. She was started on Crestor for hypercholesterolemia. She was started on Bystolic in addition to her ARB. On follow up today she is feeling better. No chest pain. Still has periods of anxiety and somewhat agitated. Doesn't take Adderall daily now. BP at home still high in the am but lower in the pm. Tolerating medication well.     Past Medical History:  Diagnosis Date  . Arthritis    oa  . Attention deficit disorder   . H/O seasonal allergies   . Hyperlipidemia   . Hypertension   . Osteoarthritis     Past Surgical History:  Procedure Laterality Date  . ABDOMINAL HYSTERECTOMY    . FEMUR IM NAIL Right 06/16/2015   Procedure: INTRAMEDULLARY (IM) NAIL FEMORAL;  Surgeon: Renette Butters, MD;  Location: [facility];  Service: Orthopedics;  Laterality: Right;     Current Outpatient Medications  Medication Sig Dispense Refill  . ADDERALL XR 30 MG 24 hr capsule TK ONE C PO QAM  0  . olmesartan (BENICAR) 40 MG tablet TAKE 1 TABLET(40 MG) BY MOUTH DAILY 90 tablet 1  . PREMARIN 0.625 MG tablet 0.625 mg.  11  . rosuvastatin (CRESTOR) 10 MG tablet Take 1 tablet (10 mg total) by mouth daily. 90 tablet 3  . traMADol (ULTRAM) 50 MG tablet Take 1 tablet (50 mg total) by mouth 2 (two) times daily. 30 tablet 0  . zolpidem (AMBIEN) 10 MG tablet Take 1 tablet (10 mg total) by mouth at bedtime as needed. for sleep 90 tablet 0  . Nebivolol HCl (BYSTOLIC)  20 MG TABS Take 1 tablet (20 mg total) by mouth daily. 30 tablet 6   No current facility-administered medications for this visit.     Allergies:   Patient has no known allergies.    Social History:  The patient  reports that she quit smoking about 12 years ago. She has never used smokeless tobacco. She reports that she drinks alcohol. She reports that she does not use drugs.   Family History:  The patient's family history includes Alcoholism in her brother; Cancer in her father; Heart disease in her brother; Heart failure in her mother; Hypertension in her father; Stroke in her father.    ROS:  Please see the history of present illness.   Otherwise, review of systems are positive for none.   All other systems are reviewed and negative.    PHYSICAL EXAM: VS:  BP (!) 173/89   Pulse 83   Ht 5\' 5"  (1.651 m)   Wt 136 lb 3.2 oz (61.8 kg)   SpO2 98%   BMI 22.66 kg/m  , BMI Body mass index is 22.66 kg/m. GEN: Well nourished, well developed, in no acute distress  HEENT: normal  Neck: no JVD, carotid bruits, or masses Cardiac: RRR; no murmurs, rubs, or gallops,no edema  Respiratory:  clear to auscultation bilaterally, normal work of breathing GI: soft, nontender, nondistended, + BS MS: no deformity or atrophy  Skin: warm and dry, no rash Neuro:  Strength and sensation are intact Psych: euthymic mood, full affect   EKG:  EKG is not ordered today.    Recent Labs: 06/04/2017: ALT 51; BUN 11; Creat 0.69; Hemoglobin 15.7; Platelets 205; Potassium 4.8; Sodium 138; TSH 0.63    Lipid Panel    Component Value Date/Time   CHOL 272 (H) 06/04/2017 0909   TRIG 123 06/04/2017 0909   HDL 91 06/04/2017 0909   CHOLHDL 3.0 06/04/2017 0909   VLDL 25 06/04/2017 0909   LDLCALC 156 (H) 06/04/2017 0909      Wt Readings from Last 3 Encounters:  03/12/18 136 lb 3.2 oz (61.8 kg)  02/13/18 139 lb (63 kg)  02/06/18 139 lb (63 kg)      Other studies Reviewed: Additional studies/ records that  were reviewed today include:   Stress Myoview: 02/13/18: Study Highlights     The left ventricular ejection fraction is hyperdynamic (>65%).  Nuclear stress EF: 67%. No wall motion abnormality.  There was no ST segment deviation noted during stress.  This is a low risk study. No ischemia identified.  The study is normal.   Candee Furbish, MD      ASSESSMENT AND PLAN:  1.  Chest pain. Resolved.  No recurrence. Confounding issues include stopping of metoprolol prior to event without taper and use of Afrin. Normal Myoview is very reassuring.  2. HTN Still suboptimal control. Will continue Olmesartan 40 mg daily. Increase  Bystolic to 20 mg daily. I will follow up in 3 months.  3. Palpitations. More a sensation of anxiety. We will see how she responds to increase in beta blocker.  4. Hypercholesterolemia. Given other risk factors would recommend statin therapy. Will start Crestor 10 mg daily. Will check fasting labs in 2 months.  5. Osteoarthritis of hips. Scheduled to see Dr. Alvan Dame in July. Should be low risk from a cardiac standpoint for surgery.   Current medicines are reviewed at length with the patient today.  The patient does not have concerns regarding medicines.    Signed, Jerone Cudmore Martinique, MD  03/12/2018 11:51 AM    [facility] [address] [place], [place], [address] Phone [phone], Fax [phone]

## 2018-03-12 ENCOUNTER — Encounter: Payer: Self-pay | Admitting: Cardiology

## 2018-03-12 ENCOUNTER — Ambulatory Visit (INDEPENDENT_AMBULATORY_CARE_PROVIDER_SITE_OTHER): Payer: Medicare Other | Admitting: Cardiology

## 2018-03-12 VITALS — BP 173/89 | HR 83 | Ht 65.0 in | Wt 136.2 lb

## 2018-03-12 DIAGNOSIS — I10 Essential (primary) hypertension: Secondary | ICD-10-CM

## 2018-03-12 DIAGNOSIS — E7800 Pure hypercholesterolemia, unspecified: Secondary | ICD-10-CM

## 2018-03-12 DIAGNOSIS — R002 Palpitations: Secondary | ICD-10-CM

## 2018-03-12 MED ORDER — NEBIVOLOL HCL 20 MG PO TABS: 20.0000 mg | ORAL_TABLET | Freq: Every day | ORAL | 6 refills | Status: DC

## 2018-03-12 NOTE — Patient Instructions (Signed)
Increase Bystolic to 20 mg daily  Continue your other therapy  Keep BP diary  I will see you in 3 months

## 2018-03-12 NOTE — Progress Notes (Signed)
Thanks

## 2018-03-13 MED ORDER — TRIAMCINOLONE ACETONIDE 40 MG/ML IJ SUSP
80.0000 mg | INTRAMUSCULAR | Status: AC | PRN
  Administered 2018-03-08: 80 mg via INTRA_ARTICULAR

## 2018-03-13 MED ORDER — BUPIVACAINE HCL 0.5 % IJ SOLN
3.0000 mL | INTRAMUSCULAR | Status: AC | PRN
  Administered 2018-03-08: 3 mL via INTRA_ARTICULAR

## 2018-03-21 ENCOUNTER — Telehealth: Payer: Self-pay | Admitting: Family Medicine

## 2018-03-21 NOTE — Telephone Encounter (Signed)
Copied from [place]. Topic: Inquiry >> Mar 21, 2018  2:31 PM Pricilla Handler wrote: Reason for CRM: Cedricka Sackrider called requesting a call back from Dr. Stevie Kern. Shahidah is not a current patient of Dr. Sherren Mocha, but she states that Dr. Sherren Mocha saw her late husband Dr. Merleen Nicely. Daphanie is seeking advice from Dr. Sherren Mocha asap concerning her son and some other issues. Best number to reach Ariane is [phone]. Roselyne requests a call from Dr. Sherren Mocha today if possible.       Thank You!!!  Pt is on the schedule to see Dr Sherren Mocha on 03/26/2018 and pt is aware.

## 2018-03-22 NOTE — Telephone Encounter (Signed)
Spoke with Miss Jeanne Smith states that she is concerned for her son and wants to know if he can schedule an appointment for a CPE/LABS since he has not had one in about 5 yrs. Please Advise

## 2018-03-25 ENCOUNTER — Other Ambulatory Visit: Payer: Self-pay

## 2018-03-25 DIAGNOSIS — F909 Attention-deficit hyperactivity disorder, unspecified type: Secondary | ICD-10-CM

## 2018-03-25 MED ORDER — ADDERALL XR 30 MG PO CP24: ORAL_CAPSULE | ORAL | 0 refills | Status: DC

## 2018-03-25 NOTE — Telephone Encounter (Signed)
Refill once 

## 2018-03-25 NOTE — Telephone Encounter (Signed)
Patient called to request a refill on her medication.

## 2018-03-25 NOTE — Addendum Note (Signed)
Addended by: Mady Haagensen on: 03/25/2018 04:07 PM   Modules accepted: Orders

## 2018-03-26 ENCOUNTER — Encounter: Payer: Self-pay | Admitting: Internal Medicine

## 2018-03-26 ENCOUNTER — Other Ambulatory Visit: Payer: Medicare Other | Admitting: Internal Medicine

## 2018-03-26 DIAGNOSIS — F900 Attention-deficit hyperactivity disorder, predominantly inattentive type: Secondary | ICD-10-CM | POA: Diagnosis not present

## 2018-03-26 DIAGNOSIS — R079 Chest pain, unspecified: Secondary | ICD-10-CM | POA: Diagnosis not present

## 2018-03-26 DIAGNOSIS — F909 Attention-deficit hyperactivity disorder, unspecified type: Secondary | ICD-10-CM | POA: Diagnosis not present

## 2018-03-26 DIAGNOSIS — F439 Reaction to severe stress, unspecified: Secondary | ICD-10-CM | POA: Diagnosis not present

## 2018-03-26 NOTE — Progress Notes (Signed)
Urine drug screen obtained for monitoring attention deficit medication

## 2018-03-27 DIAGNOSIS — Z6822 Body mass index (BMI) 22.0-22.9, adult: Secondary | ICD-10-CM | POA: Diagnosis not present

## 2018-03-27 DIAGNOSIS — M8588 Other specified disorders of bone density and structure, other site: Secondary | ICD-10-CM | POA: Diagnosis not present

## 2018-03-27 DIAGNOSIS — Z01419 Encounter for gynecological examination (general) (routine) without abnormal findings: Secondary | ICD-10-CM | POA: Diagnosis not present

## 2018-03-27 DIAGNOSIS — N958 Other specified menopausal and perimenopausal disorders: Secondary | ICD-10-CM | POA: Diagnosis not present

## 2018-03-28 ENCOUNTER — Encounter

## 2018-03-28 ENCOUNTER — Ambulatory Visit: Payer: Medicare Other | Admitting: Interventional Cardiology

## 2018-03-28 DIAGNOSIS — M79605 Pain in left leg: Secondary | ICD-10-CM | POA: Diagnosis not present

## 2018-03-28 DIAGNOSIS — M1612 Unilateral primary osteoarthritis, left hip: Secondary | ICD-10-CM | POA: Diagnosis not present

## 2018-03-28 DIAGNOSIS — M79662 Pain in left lower leg: Secondary | ICD-10-CM | POA: Diagnosis not present

## 2018-03-28 DIAGNOSIS — M25552 Pain in left hip: Secondary | ICD-10-CM | POA: Diagnosis not present

## 2018-03-28 DIAGNOSIS — M1611 Unilateral primary osteoarthritis, right hip: Secondary | ICD-10-CM | POA: Diagnosis not present

## 2018-03-28 DIAGNOSIS — M25551 Pain in right hip: Secondary | ICD-10-CM | POA: Diagnosis not present

## 2018-03-28 DIAGNOSIS — M25569 Pain in unspecified knee: Secondary | ICD-10-CM | POA: Insufficient documentation

## 2018-03-28 DIAGNOSIS — M79604 Pain in right leg: Secondary | ICD-10-CM | POA: Diagnosis not present

## 2018-03-28 DIAGNOSIS — M79661 Pain in right lower leg: Secondary | ICD-10-CM | POA: Diagnosis not present

## 2018-03-29 DIAGNOSIS — N951 Menopausal and female climacteric states: Secondary | ICD-10-CM | POA: Diagnosis not present

## 2018-03-29 DIAGNOSIS — R61 Generalized hyperhidrosis: Secondary | ICD-10-CM | POA: Diagnosis not present

## 2018-03-30 LAB — PAIN MGMT, PROFILE 8 W/CONF, U
6 ACETYLMORPHINE: NEGATIVE ng/mL (ref <10)
ALCOHOL METABOLITES: POSITIVE ng/mL — AB (ref <500)
Alphahydroxyalprazolam: NEGATIVE ng/mL (ref <25)
Alphahydroxymidazolam: NEGATIVE ng/mL (ref <50)
Alphahydroxytriazolam: NEGATIVE ng/mL (ref <50)
Aminoclonazepam: NEGATIVE ng/mL (ref <25)
Amphetamines: NEGATIVE ng/mL (ref <500)
Benzodiazepines: POSITIVE ng/mL — AB (ref <100)
Buprenorphine, Urine: NEGATIVE ng/mL (ref <5)
COCAINE METABOLITE: NEGATIVE ng/mL (ref <150)
Creatinine: 95.2 mg/dL
Ethyl Glucuronide (ETG): 261393 ng/mL — ABNORMAL HIGH (ref <500)
Hydroxyethylflurazepam: NEGATIVE ng/mL (ref <50)
LORAZEPAM: 84 ng/mL — AB (ref <50)
MDMA: NEGATIVE ng/mL (ref <500)
Marijuana Metabolite: NEGATIVE ng/mL (ref <20)
NORDIAZEPAM: NEGATIVE ng/mL (ref <50)
OPIATES: NEGATIVE ng/mL (ref <100)
OXIDANT: NEGATIVE ug/mL (ref <200)
Oxazepam: NEGATIVE ng/mL (ref <50)
Oxycodone: NEGATIVE ng/mL (ref <100)
PH: 6.62 (ref 4.5–9.0)
Temazepam: NEGATIVE ng/mL (ref <50)

## 2018-04-11 ENCOUNTER — Ambulatory Visit (INDEPENDENT_AMBULATORY_CARE_PROVIDER_SITE_OTHER): Payer: Medicare Other | Admitting: Internal Medicine

## 2018-04-11 ENCOUNTER — Encounter: Payer: Self-pay | Admitting: Internal Medicine

## 2018-04-11 VITALS — BP 160/90 | HR 78 | Ht 65.0 in | Wt 137.0 lb

## 2018-04-11 DIAGNOSIS — M1612 Unilateral primary osteoarthritis, left hip: Secondary | ICD-10-CM | POA: Diagnosis not present

## 2018-04-11 DIAGNOSIS — F419 Anxiety disorder, unspecified: Secondary | ICD-10-CM | POA: Diagnosis not present

## 2018-04-11 DIAGNOSIS — G4709 Other insomnia: Secondary | ICD-10-CM | POA: Diagnosis not present

## 2018-04-11 DIAGNOSIS — F439 Reaction to severe stress, unspecified: Secondary | ICD-10-CM

## 2018-04-11 DIAGNOSIS — I10 Essential (primary) hypertension: Secondary | ICD-10-CM

## 2018-04-11 MED ORDER — ALPRAZOLAM 0.25 MG PO TABS: 0.2500 mg | ORAL_TABLET | Freq: Two times a day (BID) | ORAL | 0 refills | Status: DC | PRN

## 2018-04-11 NOTE — Progress Notes (Signed)
   Subjective:    Patient ID: Jeanne Smith, female    DOB: 10/01/1951, 67 y.o.   MRN: [id number]  HPI patient has a history of essential hypertension which is been difficult to control I think because of severe hip pain.  She also has attention deficit disorder.  Recently we had routine drug screens which we have for patients on attention deficit medication.  Screen was positive for benzodiazepine.  She does take Xanax for anxiety.  Is also positive for alcohol.  She has been drinking some more pain.  We had a discussion about this today.  Unfortunately her blood pressure is 160/90.  She has seen Dr. Peter Martinique for blood pressure control.  She is tentatively scheduled June 20 for hip arthroplasty with Dr. Alvan Dame.  I have asked her to get back in touch with Dr. Martinique regarding her blood pressure.  It may be because of pain and anxiety her blood pressure will not be well controlled until after her surgery.  She has tramadol for pain but says is not helping.  She has Ambien for stability.  The prescription for Xanax is 0.25 mg twice daily as needed for anxiety.  Prescription for Adderall XR is 30 mg daily.  She is currently on Bystolic 20 mg daily.  Discussion about mixing alcohol with these medications.  Review of Systems     Objective:   Physical Exam Chest clear.  Cardiac exam regular rate and rhythm.  Extremities without edema.  Neck is supple without thyromegaly or carotid bruits       Assessment & Plan:  Severe hip pain-scheduled for hip arthroplasty on June 20 by Dr. Alfonse Spruce.  Patient has persistent elevated blood pressure likely due to pain and anxiety.  She will check back with Dr. Peter Martinique regarding blood pressure control  Attention deficit disorder  Anxiety  Insomnia  Plan: Patient will be back consuming alcohol with these medications

## 2018-04-24 ENCOUNTER — Telehealth: Payer: Self-pay | Admitting: Internal Medicine

## 2018-04-24 NOTE — Telephone Encounter (Signed)
Patient calling to request refill on her Tramadol 50mg .    Pharmacy:  [facility]  Phone #:  [phone]  Thank you.

## 2018-04-24 NOTE — Telephone Encounter (Signed)
Refill once 

## 2018-04-24 NOTE — Patient Instructions (Signed)
Avoid alcohol while taking medications for pain and attention deficit.  Check with Dr. Martinique regarding blood pressure control.

## 2018-04-25 ENCOUNTER — Other Ambulatory Visit: Payer: Self-pay | Admitting: Internal Medicine

## 2018-04-25 MED ORDER — TRAMADOL HCL 50 MG PO TABS: 50.0000 mg | ORAL_TABLET | Freq: Two times a day (BID) | ORAL | 0 refills | Status: DC

## 2018-04-25 NOTE — Telephone Encounter (Signed)
Called in.

## 2018-04-25 NOTE — Telephone Encounter (Signed)
Refill once 

## 2018-04-26 ENCOUNTER — Telehealth: Payer: Self-pay | Admitting: Cardiology

## 2018-04-26 NOTE — Telephone Encounter (Signed)
New Message   Patient is calling in reference to her appointment on 07/11. She states that she is having hip surgery and they are requiring her to get a cardiac clearance. I did advise the patient several time that the surgeon office can call and start the clearance process. But she insist on being seen and bringing in the form since she knows that Dr. Martinique probably will not clear here without being seen. But she does not want to wait for her appointment in July. She insisted on speaking to Dr. Martinique nurse. Please call.

## 2018-04-29 NOTE — Telephone Encounter (Signed)
Spoke to patient Fri 5/31.She stated she had clearance form for Dr.Jordan to sign before her hip replacement surgery 06/06/18.Stated she will bring form on Mon 04/29/18.

## 2018-04-30 ENCOUNTER — Telehealth: Payer: Self-pay | Admitting: Internal Medicine

## 2018-04-30 ENCOUNTER — Encounter: Payer: Self-pay | Admitting: Internal Medicine

## 2018-04-30 NOTE — Telephone Encounter (Signed)
At last visit, I asked her to call with BP readings. In a lot of pain and hopes to have arthroplasty soon. Seems to have labile HTN related to pain.Pt brings to office multiple BP readings since May 24 which range from 859-276 systolic and 39-43 diastolic. Taking Tramadol for pain. BP clearly increases with pain per her notes. Approval for surgery signed

## 2018-05-01 ENCOUNTER — Telehealth: Payer: Self-pay

## 2018-05-01 NOTE — Telephone Encounter (Signed)
   [facility] Pre-operative Risk Assessment    Request for surgical clearance:  1. What type of surgery is being performed? Right Cpnversion of Right femur ORIF to THA  2. When is this surgery scheduled? 05/16/18   3. What type of clearance is required (medical clearance vs. Pharmacy clearance to hold med vs. Both) medical  4. Are there any medications that need to be held prior to surgery and how long? no  5. Practice name and name of physician performing surgery? [facility]  Dr.Matthew Alvan Dame  6. What is your office phone number [phone]   7.   What is your office fax number [phone]  8.   Anesthesia type (None, local, MAC, general) ? unknown   Kathyrn Lass 05/01/2018, 9:10 AM  _________________________________________________________________   (provider comments below)

## 2018-05-01 NOTE — Telephone Encounter (Addendum)
   Primary Cardiologist: Peter Martinique, MD  Chart reviewed as part of pre-operative protocol coverage. Patient was contacted 05/01/2018 in reference to pre-operative risk assessment for pending surgery as outlined below.  Jeanne Smith was last seen on 03/12/18 by Dr. Martinique for pre-op clearance at which time she was felt to be low risk for surgery, meds were adjusted for HTN. Has h/o HTN, palpitations, HLD, normal nuc 01/2018. Given that it has been almost 2 months from Jeanne Smith, contacted patient to make sure still doing well. She affirms she is doing well. Blood pressure control has improved since titration of beta blocker. BP spikes seem to correlate with hip/knee pain for which she is scheduled for surgery. Her AM BP's before Tramadol or BP meds 145/78, HR 78. BP last night after feeling fine was 107/59. No CP or SOB.  Given past medical history and time since last visit, based on ACC/AHA guidelines, Jeanne Smith would be at acceptable risk for the planned procedure without further cardiovascular testing.   I will route this recommendation to the requesting party via Epic fax function and remove from pre-op pool.  Please call with questions.  Charlie Pitter, PA-C 05/01/2018, 2:45 PM

## 2018-05-02 NOTE — Telephone Encounter (Signed)
Received surgical clearance form and a list of B/P readings.Clearance form sent to clearance pool.Dr.Jordan reviewed B/P readings he advised continue same medications.Patient was informed.

## 2018-05-08 ENCOUNTER — Encounter (HOSPITAL_COMMUNITY): Payer: Self-pay

## 2018-05-08 NOTE — Pre-Procedure Instructions (Signed)
The following are in epic: Cardiac clearance telephone note Dr. Martinique 05/01/2018 EKG 02/06/2018 Stress test 02/14/2018

## 2018-05-08 NOTE — Patient Instructions (Addendum)
Your procedure is scheduled on: Thursday, May 16, 2018   Surgery Time:  8:45AM-10:45AM   Report to [facility] Main  Entrance    Report to admitting at 6:15 AM   Call this number if you have problems the morning of surgery [phone]   Do not eat food or drink liquids :After Midnight.   Do NOT smoke after Midnight   Take these medicines the morning of surgery with A SIP OF WATER: Bystolic, Xanax, and Tramadol if needed                               You may not have any metal on your body including hair pins, jewelry, and body piercings             Do not wear make-up, lotions, powders, perfumes/cologne, or deodorant             Do not wear nail polish.  Do not shave  48 hours prior to surgery.               Do not bring valuables to the hospital. [facility].   Contacts, dentures or bridgework may not be worn into surgery.   Leave suitcase in the car. After surgery it may be brought to your room.   Special Instructions: Bring a copy of your healthcare power of attorney and living will documents         the day of surgery if you haven't scanned them in before.              Please read over the following fact sheets you were given:  [facility] - Preparing for Surgery Before surgery, you can play an important role.  Because skin is not sterile, your skin needs to be as free of germs as possible.  You can reduce the number of germs on your skin by washing with CHG (chlorahexidine gluconate) soap before surgery.  CHG is an antiseptic cleaner which kills germs and bonds with the skin to continue killing germs even after washing. Please DO NOT use if you have an allergy to CHG or antibacterial soaps.  If your skin becomes reddened/irritated stop using the CHG and inform your nurse when you arrive at Short Stay. Do not shave (including legs and underarms) for at least 48 hours prior to the first CHG shower.  You may shave  your face/neck.  Please follow these instructions carefully:  1.  Shower with CHG Soap the night before surgery and the  morning of surgery.  2.  If you choose to wash your hair, wash your hair first as usual with your normal  shampoo.  3.  After you shampoo, rinse your hair and body thoroughly to remove the shampoo.                             4.  Use CHG as you would any other liquid soap.  You can apply chg directly to the skin and wash.  Gently with a scrungie or clean washcloth.  5.  Apply the CHG Soap to your body ONLY FROM THE NECK DOWN.   Do   not use on face/ open  Wound or open sores. Avoid contact with eyes, ears mouth and   genitals (private parts).                       Wash face,  Genitals (private parts) with your normal soap.             6.  Wash thoroughly, paying special attention to the area where your    surgery  will be performed.  7.  Thoroughly rinse your body with warm water from the neck down.  8.  DO NOT shower/wash with your normal soap after using and rinsing off the CHG Soap.                9.  Pat yourself dry with a clean towel.            10.  Wear clean pajamas.            11.  Place clean sheets on your bed the night of your first shower and do not  sleep with pets. Day of Surgery : Do not apply any lotions/deodorants the morning of surgery.  Please wear clean clothes to the hospital/surgery center.  FAILURE TO FOLLOW THESE INSTRUCTIONS MAY RESULT IN THE CANCELLATION OF YOUR SURGERY  PATIENT SIGNATURE_________________________________  NURSE SIGNATURE__________________________________  ________________________________________________________________________   Adam Phenix  An incentive spirometer is a tool that can help keep your lungs clear and active. This tool measures how well you are filling your lungs with each breath. Taking long deep breaths may help reverse or decrease the chance of developing breathing  (pulmonary) problems (especially infection) following:  A long period of time when you are unable to move or be active. BEFORE THE PROCEDURE   If the spirometer includes an indicator to show your best effort, your nurse or respiratory therapist will set it to a desired goal.  If possible, sit up straight or lean slightly forward. Try not to slouch.  Hold the incentive spirometer in an upright position. INSTRUCTIONS FOR USE  1. Sit on the edge of your bed if possible, or sit up as far as you can in bed or on a chair. 2. Hold the incentive spirometer in an upright position. 3. Breathe out normally. 4. Place the mouthpiece in your mouth and seal your lips tightly around it. 5. Breathe in slowly and as deeply as possible, raising the piston or the ball toward the top of the column. 6. Hold your breath for 3-5 seconds or for as long as possible. Allow the piston or ball to fall to the bottom of the column. 7. Remove the mouthpiece from your mouth and breathe out normally. 8. Rest for a few seconds and repeat Steps 1 through 7 at least 10 times every 1-2 hours when you are awake. Take your time and take a few normal breaths between deep breaths. 9. The spirometer may include an indicator to show your best effort. Use the indicator as a goal to work toward during each repetition. 10. After each set of 10 deep breaths, practice coughing to be sure your lungs are clear. If you have an incision (the cut made at the time of surgery), support your incision when coughing by placing a pillow or rolled up towels firmly against it. Once you are able to get out of bed, walk around indoors and cough well. You may stop using the incentive spirometer when instructed by your caregiver.  RISKS AND COMPLICATIONS  Take your time  so you do not get dizzy or light-headed.  If you are in pain, you may need to take or ask for pain medication before doing incentive spirometry. It is harder to take a deep breath if you  are having pain. AFTER USE  Rest and breathe slowly and easily.  It can be helpful to keep track of a log of your progress. Your caregiver can provide you with a simple table to help with this. If you are using the spirometer at home, follow these instructions: [place] IF:   You are having difficultly using the spirometer.  You have trouble using the spirometer as often as instructed.  Your pain medication is not giving enough relief while using the spirometer.  You develop fever of 100.5 F (38.1 C) or higher. SEEK IMMEDIATE MEDICAL CARE IF:   You cough up bloody sputum that had not been present before.  You develop fever of 102 F (38.9 C) or greater.  You develop worsening pain at or near the incision site. MAKE SURE YOU:   Understand these instructions.  Will watch your condition.  Will get help right away if you are not doing well or get worse. Document Released: 03/26/2007 Document Revised: 02/05/2012 Document Reviewed: 05/27/2007 ExitCare Patient Information 2014 [employer], [place].   ________________________________________________________________________  WHAT IS A BLOOD TRANSFUSION? Blood Transfusion Information  A transfusion is the replacement of blood or some of its parts. Blood is made up of multiple cells which provide different functions.  Red blood cells carry oxygen and are used for blood loss replacement.  White blood cells fight against infection.  Platelets control bleeding.  Plasma helps clot blood.  Other blood products are available for specialized needs, such as hemophilia or other clotting disorders. BEFORE THE TRANSFUSION  Who gives blood for transfusions?   Healthy volunteers who are fully evaluated to make sure their blood is safe. This is blood bank blood. Transfusion therapy is the safest it has ever been in the practice of medicine. Before blood is taken from a donor, a complete history is taken to make sure that person has  no history of diseases nor engages in risky social behavior (examples are intravenous drug use or sexual activity with multiple partners). The donor's travel history is screened to minimize risk of transmitting infections, such as malaria. The donated blood is tested for signs of infectious diseases, such as HIV and hepatitis. The blood is then tested to be sure it is compatible with you in order to minimize the chance of a transfusion reaction. If you or a relative donates blood, this is often done in anticipation of surgery and is not appropriate for emergency situations. It takes many days to process the donated blood. RISKS AND COMPLICATIONS Although transfusion therapy is very safe and saves many lives, the main dangers of transfusion include:   Getting an infectious disease.  Developing a transfusion reaction. This is an allergic reaction to something in the blood you were given. Every precaution is taken to prevent this. The decision to have a blood transfusion has been considered carefully by your caregiver before blood is given. Blood is not given unless the benefits outweigh the risks. AFTER THE TRANSFUSION  Right after receiving a blood transfusion, you will usually feel much better and more energetic. This is especially true if your red blood cells have gotten low (anemic). The transfusion raises the level of the red blood cells which carry oxygen, and this usually causes an energy increase.  The  nurse administering the transfusion will monitor you carefully for complications. HOME CARE INSTRUCTIONS  No special instructions are needed after a transfusion. You may find your energy is better. Speak with your caregiver about any limitations on activity for underlying diseases you may have. SEEK MEDICAL CARE IF:   Your condition is not improving after your transfusion.  You develop redness or irritation at the intravenous (IV) site. SEEK IMMEDIATE MEDICAL CARE IF:  Any of the following  symptoms occur over the next 12 hours:  Shaking chills.  You have a temperature by mouth above 102 F (38.9 C), not controlled by medicine.  Chest, back, or muscle pain.  People around you feel you are not acting correctly or are confused.  Shortness of breath or difficulty breathing.  Dizziness and fainting.  You get a rash or develop hives.  You have a decrease in urine output.  Your urine turns a dark color or changes to pink, red, or brown. Any of the following symptoms occur over the next 10 days:  You have a temperature by mouth above 102 F (38.9 C), not controlled by medicine.  Shortness of breath.  Weakness after normal activity.  The white part of the eye turns yellow (jaundice).  You have a decrease in the amount of urine or are urinating less often.  Your urine turns a dark color or changes to pink, red, or brown. Document Released: 11/10/2000 Document Revised: 02/05/2012 Document Reviewed: 06/29/2008 [facility] Patient Information 2014 [place], [place].  _______________________________________________________________________

## 2018-05-10 ENCOUNTER — Encounter (HOSPITAL_COMMUNITY): Payer: Self-pay

## 2018-05-10 ENCOUNTER — Other Ambulatory Visit: Payer: Self-pay

## 2018-05-10 ENCOUNTER — Other Ambulatory Visit: Payer: Self-pay | Admitting: Orthopedic Surgery

## 2018-05-10 ENCOUNTER — Encounter (HOSPITAL_COMMUNITY)
Admission: RE | Admit: 2018-05-10 | Discharge: 2018-05-10 | Disposition: A | Discharge status: Home / Self Care | Payer: Medicare Other | Source: Ambulatory Visit | Attending: Orthopedic Surgery | Admitting: Orthopedic Surgery

## 2018-05-10 DIAGNOSIS — M1612 Unilateral primary osteoarthritis, left hip: Secondary | ICD-10-CM | POA: Insufficient documentation

## 2018-05-10 DIAGNOSIS — Z01818 Encounter for other preprocedural examination: Secondary | ICD-10-CM | POA: Insufficient documentation

## 2018-05-10 HISTORY — DX: Cardiac arrhythmia, unspecified: I49.9

## 2018-05-10 HISTORY — DX: Insomnia, unspecified: G47.00

## 2018-05-10 HISTORY — DX: Nontoxic single thyroid nodule: E04.1

## 2018-05-10 HISTORY — DX: Anxiety disorder, unspecified: F41.9

## 2018-05-10 HISTORY — DX: Other specified disorders of bone density and structure, unspecified site: M85.80

## 2018-05-10 HISTORY — DX: Hypothyroidism, unspecified: E03.9

## 2018-05-10 LAB — CBC
HCT: 45.1 % (ref 36.0–46.0)
HEMOGLOBIN: 15.6 g/dL — AB (ref 12.0–15.0)
MCH: 34.4 pg — ABNORMAL HIGH (ref 26.0–34.0)
MCHC: 34.6 g/dL (ref 30.0–36.0)
MCV: 99.3 fL (ref 78.0–100.0)
PLATELETS: 191 10*3/uL (ref 150–400)
RBC: 4.54 MIL/uL (ref 3.87–5.11)
RDW: 13.2 % (ref 11.5–15.5)
WBC: 7.7 10*3/uL (ref 4.0–10.5)

## 2018-05-10 LAB — BASIC METABOLIC PANEL
Anion gap: 8 (ref 5–15)
BUN: 12 mg/dL (ref 6–20)
CHLORIDE: 104 mmol/L (ref 101–111)
CO2: 27 mmol/L (ref 22–32)
CREATININE: 0.69 mg/dL (ref 0.44–1.00)
Calcium: 9.4 mg/dL (ref 8.9–10.3)
GFR calc Af Amer: >60 mL/min (ref >60)
Glucose, Bld: 121 mg/dL — ABNORMAL HIGH (ref 65–99)
POTASSIUM: 4.3 mmol/L (ref 3.5–5.1)
Sodium: 139 mmol/L (ref 135–145)

## 2018-05-10 LAB — SURGICAL PCR SCREEN
MRSA, PCR: NEGATIVE
STAPHYLOCOCCUS AUREUS: NEGATIVE

## 2018-05-10 NOTE — Care Plan (Signed)
Ortho Bundle L THA scheduled on 05-16-18 DCP:  Home with family.  2 story/3 ste. DME:  No needs.  Has a RW and 3-in-1. PT:  HEP

## 2018-05-10 NOTE — H&P (Signed)
TOTAL HIP ADMISSION H&P  Patient is admitted for left total hip arthroplasty, anterior approach.  Subjective:  Chief Complaint:   Left hip primary OA / pain  HPI: Jeanne Smith, 67 y.o. female, has a history of pain and functional disability in the left hip(s) due to arthritis and patient has failed non-surgical conservative treatments for greater than 12 weeks to include NSAID's and/or analgesics, corticosteriod injections and activity modification.  Onset of symptoms was gradual starting ~1 year ago with gradually worsening course since that time.The patient noted no past surgery on the left hip(s).  Patient currently rates pain in the left hip at 9 out of 10 with activity. Patient has night pain, worsening of pain with activity and weight bearing, trendelenberg gait, pain that interfers with activities of daily living and pain with passive range of motion. Patient has evidence of periarticular osteophytes and joint space narrowing by imaging studies. This condition presents safety issues increasing the risk of falls. There is no current active infection.  Risks, benefits and expectations were discussed with the patient.  Risks including but not limited to the risk of anesthesia, blood clots, nerve damage, blood vessel damage, failure of the prosthesis, infection and up to and including death.  Patient understand the risks, benefits and expectations and wishes to proceed with surgery.   PCP: Elby Showers, MD  D/C Plans:       Home  Post-op Meds:       No Rx given  Tranexamic Acid:      To be given - IV   Decadron:      Is to be given  FYI:      ASA  Norco  DME:   Pt already has equipment  PT:   No PT    Patient Active Problem List   Diagnosis Date Noted  . Pure hypercholesterolemia 12/24/2017  . Intertrochanteric fracture of right femur ([place]) 06/15/2015  . Comminuted right humeral fracture 06/15/2015  . HTN (hypertension) 06/15/2015   Past Medical History:  Diagnosis Date   . Anxiety   . Arthritis    oa  . Attention deficit disorder   . Dysrhythmia    episode of palpitations x 25 January 2018, stress test normal  . H/O seasonal allergies   . Hyperlipidemia   . Hypertension   . Hypothyroidism    thyroid nodules, off synthroid > 15 years  . Insomnia   . Osteoarthritis   . Osteopenia   . Thyroid nodule     Past Surgical History:  Procedure Laterality Date  . ABDOMINAL HYSTERECTOMY    . FEMUR IM NAIL Right 06/16/2015   Procedure: INTRAMEDULLARY (IM) NAIL FEMORAL;  Surgeon: Renette Butters, MD;  Location: [facility];  Service: Orthopedics;  Laterality: Right;  . TUBAL LIGATION      No current facility-administered medications for this encounter.    Current Outpatient Medications  Medication Sig Dispense Refill Last Dose  . acetaminophen (TYLENOL) 325 MG tablet Take 650 mg by mouth every 6 (six) hours as needed for mild pain, moderate pain or headache.     . ALPRAZolam (XANAX) 0.25 MG tablet Take 1 tablet (0.25 mg total) by mouth 2 (two) times daily as needed for anxiety. 20 tablet 0   . amphetamine-dextroamphetamine (ADDERALL) 30 MG tablet Take 15-30 mg by mouth daily as needed (ATTENTION DEFICIT.).     Marland Kitchen Artificial Tear Ointment (DRY EYES OP) Place 1 drop into both eyes as needed (dry eyes).     Marland Kitchen  Calcium Citrate-Vitamin D (CALCIUM CITRATE + D PO) Take 1 tablet by mouth daily with lunch.     . cholecalciferol (VITAMIN D) 1000 units tablet Take 5,000 Units by mouth daily with lunch.     . estrogens, conjugated, (PREMARIN) 0.9 MG tablet Take 0.9 mg by mouth every morning.     . Multiple Vitamin (MULTIVITAMIN WITH MINERALS) TABS tablet Take 1 tablet by mouth daily with lunch.     . Nebivolol HCl (BYSTOLIC) 20 MG TABS Take 1 tablet (20 mg total) by mouth daily. (Patient taking differently: Take 20 mg by mouth every morning. ) 30 tablet 6 Taking  . olmesartan (BENICAR) 40 MG tablet TAKE 1 TABLET(40 MG) BY MOUTH DAILY (Patient taking differently: TAKE 1 TABLET(40  MG) BY MOUTH EVERY MORNING) 90 tablet 1 Taking  . Omega-3 Fatty Acids (EQL OMEGA 3 FISH OIL) 1200 MG CAPS Take 1 capsule by mouth daily with lunch.     . rosuvastatin (CRESTOR) 10 MG tablet Take 1 tablet (10 mg total) by mouth daily. (Patient taking differently: Take 10 mg by mouth every evening. ) 90 tablet 3 Taking  . Saline (SIMPLY SALINE) 0.9 % AERS Place 1 spray into both nostrils 2 (two) times daily as needed (seasonal allergies.).     Marland Kitchen traMADol (ULTRAM) 50 MG tablet Take 1 tablet (50 mg total) by mouth 2 (two) times daily. (Patient taking differently: Take 50 mg by mouth 3 (three) times daily. ) 30 tablet 0   . zolpidem (AMBIEN) 10 MG tablet TAKE 1 TABLET BY MOUTH EVERY NIGHT AT BEDTIME (Patient taking differently: TAKE 10 MG EVERY NIGHT AT BEDTIME AS NEEDED FOR SLEEP) 90 tablet 0   . ADDERALL XR 30 MG 24 hr capsule TK ONE CAP AS NEEDED (Patient not taking: Reported on 05/07/2018) 30 capsule 0 Not Taking at Unknown time   No Known Allergies   Social History   Tobacco Use  . Smoking status: Former Smoker    Packs/day: 0.25    Last attempt to quit: 11/27/2005    Years since quitting: 12.4  . Smokeless tobacco: Never Used  Substance Use Topics  . Alcohol use: Yes    Comment: wine almost daily     Family History  Problem Relation Age of Onset  . Heart failure Mother   . Hypertension Father   . Cancer Father   . Stroke Father   . Heart disease Brother   . Alcoholism Brother      Review of Systems  Constitutional: Negative.   HENT: Negative.   Eyes: Negative.   Respiratory: Negative.   Cardiovascular: Negative.   Gastrointestinal: Negative.   Genitourinary: Negative.   Musculoskeletal: Positive for joint pain.  Skin: Negative.   Neurological: Negative.   Endo/Heme/Allergies: Positive for environmental allergies.  Psychiatric/Behavioral: The patient is nervous/anxious and has insomnia.     Objective:  Physical Exam  Constitutional: She is oriented to person, place, and  time. She appears well-developed.  HENT:  Head: Normocephalic.  Eyes: Pupils are equal, round, and reactive to light.  Neck: Neck supple. No JVD present. No tracheal deviation present. No thyromegaly present.  Cardiovascular: Normal rate, regular rhythm and intact distal pulses.  Respiratory: Effort normal and breath sounds normal. No respiratory distress. She has no wheezes.  GI: Soft. There is no tenderness. There is no guarding.  Musculoskeletal:       Left hip: She exhibits decreased range of motion, decreased strength, tenderness and bony tenderness. She exhibits no swelling, no deformity  and no laceration.  Lymphadenopathy:    She has no cervical adenopathy.  Neurological: She is alert and oriented to person, place, and time.  Skin: Skin is warm and dry.  Psychiatric: She has a normal mood and affect.    Vital signs in last 24 hours: Temp:  [98.3 F (36.8 C)] 98.3 F (36.8 C) (06/14 1041) Pulse Rate:  [71] 71 (06/14 1041) Resp:  [16] 16 (06/14 1041) BP: (147)/(74) 147/74 (06/14 1041) SpO2:  [99 %] 99 % (06/14 1041) Weight:  [60 kg (132 lb 6 oz)] 60 kg (132 lb 6 oz) (06/14 1041)  Labs:   Estimated body mass index is 22.03 kg/m as calculated from the following:   Height as of 05/10/18: 5\' 5"  (1.651 m).   Weight as of 05/10/18: 60 kg (132 lb 6 oz).   Imaging Review Plain radiographs demonstrate severe degenerative joint disease of the left hip. The bone quality appears to be good for age and reported activity level.    Preoperative templating of the joint replacement has been completed, documented, and submitted to the Operating Room personnel in order to optimize intra-operative equipment management.     Assessment/Plan:  End stage arthritis, left hip  The patient history, physical examination, clinical judgement of the provider and imaging studies are consistent with end stage degenerative joint disease of the left hip(s) and total hip arthroplasty is deemed  medically necessary. The treatment options including medical management, injection therapy, arthroscopy and arthroplasty were discussed at length. The risks and benefits of total hip arthroplasty were presented and reviewed. The risks due to aseptic loosening, infection, stiffness, dislocation/subluxation,  thromboembolic complications and other imponderables were discussed.  The patient acknowledged the explanation, agreed to proceed with the plan and consent was signed. Patient is being admitted for inpatient treatment for surgery, pain control, PT, OT, prophylactic antibiotics, VTE prophylaxis, progressive ambulation and ADL's and discharge planning.The patient is planning to be discharged home.      West Pugh Melinna Linarez   PA-C  05/10/2018, 12:15 PM

## 2018-05-15 MED ORDER — TRANEXAMIC ACID 1000 MG/10ML IV SOLN
1000.0000 mg | INTRAVENOUS | Status: AC
  Administered 2018-05-16: 1000 mg via INTRAVENOUS
  Filled 2018-05-15: qty 1100

## 2018-05-15 NOTE — Anesthesia Preprocedure Evaluation (Addendum)
Anesthesia Evaluation  Patient identified by MRN, date of birth, ID band Patient awake    Reviewed: Allergy & Precautions, NPO status , Patient's Chart, lab work & pertinent test results, reviewed documented beta blocker date and time   Airway Mallampati: I  TM Distance: >3 FB Neck ROM: full    Dental  (+) Teeth Intact   Pulmonary former smoker,    Pulmonary exam normal        Cardiovascular hypertension, Pt. on medications and Pt. on home beta blockers Normal cardiovascular exam Rhythm:regular     Neuro/Psych PSYCHIATRIC DISORDERS    GI/Hepatic   Endo/Other  Hypothyroidism   Renal/GU      Musculoskeletal  (+) Arthritis ,   Abdominal   Peds  Hematology   Anesthesia Other Findings   Reproductive/Obstetrics                            Anesthesia Physical  Anesthesia Plan  ASA: II  Anesthesia Plan: Spinal   Post-op Pain Management:    Induction: Intravenous  PONV Risk Score and Plan: 3 and Ondansetron, Dexamethasone and Propofol infusion  Airway Management Planned:   Additional Equipment:   Intra-op Plan:   Post-operative Plan:   Informed Consent: I have reviewed the patients History and Physical, chart, labs and discussed the procedure including the risks, benefits and alternatives for the proposed anesthesia with the patient or authorized representative who has indicated his/her understanding and acceptance.   Dental Advisory Given  Plan Discussed with: CRNA and Surgeon  Anesthesia Plan Comments:         Anesthesia Quick Evaluation

## 2018-05-16 ENCOUNTER — Encounter (HOSPITAL_COMMUNITY): Payer: Self-pay | Admitting: Emergency Medicine

## 2018-05-16 ENCOUNTER — Inpatient Hospital Stay (HOSPITAL_COMMUNITY): Payer: Medicare Other | Admitting: Anesthesiology

## 2018-05-16 ENCOUNTER — Inpatient Hospital Stay (HOSPITAL_COMMUNITY): Payer: Medicare Other

## 2018-05-16 ENCOUNTER — Other Ambulatory Visit: Payer: Self-pay

## 2018-05-16 ENCOUNTER — Encounter (HOSPITAL_COMMUNITY)
Admission: RE | Disposition: A | Discharge status: Home / Self Care | Payer: Self-pay | Source: Ambulatory Visit | Attending: Orthopedic Surgery

## 2018-05-16 ENCOUNTER — Inpatient Hospital Stay (HOSPITAL_COMMUNITY)
Admission: RE | Admit: 2018-05-16 | Discharge: 2018-05-17 | DRG: 470 | Disposition: A | Discharge status: Home / Self Care | Payer: Medicare Other | Source: Ambulatory Visit | Attending: Orthopedic Surgery | Admitting: Orthopedic Surgery

## 2018-05-16 DIAGNOSIS — M1612 Unilateral primary osteoarthritis, left hip: Secondary | ICD-10-CM | POA: Diagnosis present

## 2018-05-16 DIAGNOSIS — E039 Hypothyroidism, unspecified: Secondary | ICD-10-CM | POA: Diagnosis present

## 2018-05-16 DIAGNOSIS — Z471 Aftercare following joint replacement surgery: Secondary | ICD-10-CM | POA: Diagnosis not present

## 2018-05-16 DIAGNOSIS — F419 Anxiety disorder, unspecified: Secondary | ICD-10-CM | POA: Diagnosis not present

## 2018-05-16 DIAGNOSIS — Z79890 Hormone replacement therapy (postmenopausal): Secondary | ICD-10-CM

## 2018-05-16 DIAGNOSIS — E7800 Pure hypercholesterolemia, unspecified: Secondary | ICD-10-CM | POA: Diagnosis not present

## 2018-05-16 DIAGNOSIS — G4700 Insomnia, unspecified: Secondary | ICD-10-CM | POA: Diagnosis present

## 2018-05-16 DIAGNOSIS — Z96642 Presence of left artificial hip joint: Secondary | ICD-10-CM

## 2018-05-16 DIAGNOSIS — Z87891 Personal history of nicotine dependence: Secondary | ICD-10-CM | POA: Diagnosis not present

## 2018-05-16 DIAGNOSIS — I10 Essential (primary) hypertension: Secondary | ICD-10-CM | POA: Diagnosis present

## 2018-05-16 DIAGNOSIS — F988 Other specified behavioral and emotional disorders with onset usually occurring in childhood and adolescence: Secondary | ICD-10-CM | POA: Diagnosis present

## 2018-05-16 DIAGNOSIS — Z96649 Presence of unspecified artificial hip joint: Secondary | ICD-10-CM

## 2018-05-16 HISTORY — PX: CONVERSION TO TOTAL HIP: SHX5784

## 2018-05-16 LAB — TYPE AND SCREEN
ABO/RH(D): O NEG
Antibody Screen: NEGATIVE

## 2018-05-16 SURGERY — CONVERSION, PREVIOUS HIP SURGERY, TO TOTAL HIP ARTHROPLASTY
Anesthesia: Spinal | Site: Hip | Laterality: Left

## 2018-05-16 MED ORDER — METOCLOPRAMIDE HCL 5 MG/ML IJ SOLN: 5.0000 mg | Freq: Three times a day (TID) | INTRAMUSCULAR | Status: DC | PRN

## 2018-05-16 MED ORDER — STERILE WATER FOR IRRIGATION IR SOLN
Status: DC | PRN
  Administered 2018-05-16: 2000 mL

## 2018-05-16 MED ORDER — HYDROCODONE-ACETAMINOPHEN 7.5-325 MG PO TABS
1.0000 | ORAL_TABLET | ORAL | Status: DC | PRN
  Administered 2018-05-16 – 2018-05-17 (×2): 2 via ORAL
  Filled 2018-05-16 (×2): qty 2

## 2018-05-16 MED ORDER — ONDANSETRON HCL 4 MG/2ML IJ SOLN
INTRAMUSCULAR | Status: DC | PRN
  Administered 2018-05-16: 4 mg via INTRAVENOUS

## 2018-05-16 MED ORDER — ONDANSETRON HCL 4 MG/2ML IJ SOLN
INTRAMUSCULAR | Status: AC
  Filled 2018-05-16: qty 2

## 2018-05-16 MED ORDER — BISACODYL 10 MG RE SUPP: 10.0000 mg | Freq: Every day | RECTAL | Status: DC | PRN

## 2018-05-16 MED ORDER — ZOLPIDEM TARTRATE 5 MG PO TABS: 5.0000 mg | ORAL_TABLET | Freq: Every day | ORAL | Status: DC

## 2018-05-16 MED ORDER — CELECOXIB 200 MG PO CAPS
200.0000 mg | ORAL_CAPSULE | Freq: Two times a day (BID) | ORAL | Status: DC
  Administered 2018-05-16 – 2018-05-17 (×3): 200 mg via ORAL
  Filled 2018-05-16 (×3): qty 1

## 2018-05-16 MED ORDER — DEXAMETHASONE SODIUM PHOSPHATE 10 MG/ML IJ SOLN
10.0000 mg | Freq: Once | INTRAMUSCULAR | Status: AC
  Administered 2018-05-17: 10 mg via INTRAVENOUS
  Filled 2018-05-16: qty 1

## 2018-05-16 MED ORDER — DOCUSATE SODIUM 100 MG PO CAPS
100.0000 mg | ORAL_CAPSULE | Freq: Two times a day (BID) | ORAL | Status: DC
  Administered 2018-05-16 – 2018-05-17 (×2): 100 mg via ORAL
  Filled 2018-05-16 (×2): qty 1

## 2018-05-16 MED ORDER — PHENOL 1.4 % MT LIQD
1.0000 | OROMUCOSAL | Status: DC | PRN
  Filled 2018-05-16: qty 177

## 2018-05-16 MED ORDER — FERROUS SULFATE 325 (65 FE) MG PO TABS
325.0000 mg | ORAL_TABLET | Freq: Three times a day (TID) | ORAL | Status: DC
  Administered 2018-05-16 – 2018-05-17 (×2): 325 mg via ORAL
  Filled 2018-05-16 (×2): qty 1

## 2018-05-16 MED ORDER — DEXAMETHASONE SODIUM PHOSPHATE 10 MG/ML IJ SOLN
INTRAMUSCULAR | Status: AC
  Filled 2018-05-16: qty 1

## 2018-05-16 MED ORDER — PROPOFOL 10 MG/ML IV BOLUS
INTRAVENOUS | Status: AC
  Filled 2018-05-16: qty 40

## 2018-05-16 MED ORDER — MENTHOL 3 MG MT LOZG: 1.0000 | LOZENGE | OROMUCOSAL | Status: DC | PRN

## 2018-05-16 MED ORDER — MORPHINE SULFATE (PF) 2 MG/ML IV SOLN
0.5000 mg | INTRAVENOUS | Status: DC | PRN
  Administered 2018-05-16: 1 mg via INTRAVENOUS
  Filled 2018-05-16: qty 1

## 2018-05-16 MED ORDER — MEPERIDINE HCL 50 MG/ML IJ SOLN: 6.2500 mg | INTRAMUSCULAR | Status: DC | PRN

## 2018-05-16 MED ORDER — LIP MEDEX EX OINT
TOPICAL_OINTMENT | CUTANEOUS | Status: AC
  Filled 2018-05-16: qty 7

## 2018-05-16 MED ORDER — CHLORHEXIDINE GLUCONATE 4 % EX LIQD: 60.0000 mL | Freq: Once | CUTANEOUS | Status: DC

## 2018-05-16 MED ORDER — HYDROCODONE-ACETAMINOPHEN 5-325 MG PO TABS
1.0000 | ORAL_TABLET | ORAL | Status: DC | PRN
  Administered 2018-05-16 – 2018-05-17 (×4): 2 via ORAL
  Filled 2018-05-16 (×4): qty 2

## 2018-05-16 MED ORDER — PHENYLEPHRINE HCL 10 MG/ML IJ SOLN: INTRAVENOUS | Status: DC | PRN

## 2018-05-16 MED ORDER — FENTANYL CITRATE (PF) 100 MCG/2ML IJ SOLN
INTRAMUSCULAR | Status: AC
  Filled 2018-05-16: qty 2

## 2018-05-16 MED ORDER — MAGNESIUM CITRATE PO SOLN: 1.0000 | Freq: Once | ORAL | Status: DC | PRN

## 2018-05-16 MED ORDER — POLYETHYLENE GLYCOL 3350 17 G PO PACK
17.0000 g | PACK | Freq: Two times a day (BID) | ORAL | Status: DC
  Administered 2018-05-16: 17 g via ORAL
  Filled 2018-05-16: qty 1

## 2018-05-16 MED ORDER — ALPRAZOLAM 0.25 MG PO TABS: 0.2500 mg | ORAL_TABLET | Freq: Two times a day (BID) | ORAL | Status: DC | PRN

## 2018-05-16 MED ORDER — HYDROMORPHONE HCL 1 MG/ML IJ SOLN
0.2500 mg | INTRAMUSCULAR | Status: DC | PRN
  Administered 2018-05-16 (×2): 0.25 mg via INTRAVENOUS

## 2018-05-16 MED ORDER — MIDAZOLAM HCL 2 MG/2ML IJ SOLN
INTRAMUSCULAR | Status: AC
  Filled 2018-05-16: qty 2

## 2018-05-16 MED ORDER — METHOCARBAMOL 500 MG PO TABS
500.0000 mg | ORAL_TABLET | Freq: Four times a day (QID) | ORAL | Status: DC | PRN
  Administered 2018-05-16 – 2018-05-17 (×2): 500 mg via ORAL
  Filled 2018-05-16 (×2): qty 1

## 2018-05-16 MED ORDER — PROPOFOL 10 MG/ML IV BOLUS
INTRAVENOUS | Status: AC
  Filled 2018-05-16: qty 20

## 2018-05-16 MED ORDER — METHOCARBAMOL 1000 MG/10ML IJ SOLN
500.0000 mg | Freq: Four times a day (QID) | INTRAVENOUS | Status: DC | PRN
  Administered 2018-05-16: 500 mg via INTRAVENOUS
  Filled 2018-05-16: qty 550

## 2018-05-16 MED ORDER — PROPOFOL 10 MG/ML IV BOLUS
INTRAVENOUS | Status: DC | PRN
  Administered 2018-05-16: 10 mg via INTRAVENOUS

## 2018-05-16 MED ORDER — KETOROLAC TROMETHAMINE 30 MG/ML IJ SOLN: 30.0000 mg | Freq: Once | INTRAMUSCULAR | Status: DC | PRN

## 2018-05-16 MED ORDER — FENTANYL CITRATE (PF) 100 MCG/2ML IJ SOLN
INTRAMUSCULAR | Status: DC | PRN
  Administered 2018-05-16: 100 ug via INTRAVENOUS

## 2018-05-16 MED ORDER — DEXAMETHASONE SODIUM PHOSPHATE 10 MG/ML IJ SOLN
10.0000 mg | Freq: Once | INTRAMUSCULAR | Status: AC
  Administered 2018-05-16: 10 mg via INTRAVENOUS

## 2018-05-16 MED ORDER — DIPHENHYDRAMINE HCL 12.5 MG/5ML PO ELIX
12.5000 mg | ORAL_SOLUTION | ORAL | Status: DC | PRN
Start: 2018-05-16 — End: 2018-05-17

## 2018-05-16 MED ORDER — 0.9 % SODIUM CHLORIDE (POUR BTL) OPTIME
TOPICAL | Status: DC | PRN
  Administered 2018-05-16: 1000 mL

## 2018-05-16 MED ORDER — HYDROMORPHONE HCL 1 MG/ML IJ SOLN
INTRAMUSCULAR | Status: AC
  Filled 2018-05-16: qty 1

## 2018-05-16 MED ORDER — ACETAMINOPHEN 325 MG PO TABS
325.0000 mg | ORAL_TABLET | Freq: Four times a day (QID) | ORAL | Status: DC | PRN
  Administered 2018-05-17: 650 mg via ORAL
  Filled 2018-05-16: qty 2

## 2018-05-16 MED ORDER — SODIUM CHLORIDE 0.9 % IV SOLN
INTRAVENOUS | Status: DC
  Administered 2018-05-16: 14:00:00 via INTRAVENOUS

## 2018-05-16 MED ORDER — BUPIVACAINE IN DEXTROSE 0.75-8.25 % IT SOLN
INTRATHECAL | Status: DC | PRN
  Administered 2018-05-16: 1.6 mL via INTRATHECAL

## 2018-05-16 MED ORDER — PROPOFOL 500 MG/50ML IV EMUL
INTRAVENOUS | Status: DC | PRN
  Administered 2018-05-16: 75 ug/kg/min via INTRAVENOUS

## 2018-05-16 MED ORDER — CEFAZOLIN SODIUM-DEXTROSE 2-4 GM/100ML-% IV SOLN
2.0000 g | Freq: Four times a day (QID) | INTRAVENOUS | Status: AC
  Administered 2018-05-16 (×2): 2 g via INTRAVENOUS
  Filled 2018-05-16 (×2): qty 100

## 2018-05-16 MED ORDER — ROSUVASTATIN CALCIUM 10 MG PO TABS
10.0000 mg | ORAL_TABLET | Freq: Every evening | ORAL | Status: DC
  Administered 2018-05-16: 10 mg via ORAL
  Filled 2018-05-16: qty 1

## 2018-05-16 MED ORDER — ASPIRIN 81 MG PO CHEW
81.0000 mg | CHEWABLE_TABLET | Freq: Two times a day (BID) | ORAL | Status: DC
  Administered 2018-05-16 – 2018-05-17 (×2): 81 mg via ORAL
  Filled 2018-05-16 (×2): qty 1

## 2018-05-16 MED ORDER — AMPHETAMINE-DEXTROAMPHETAMINE 10 MG PO TABS: 15.0000 mg | ORAL_TABLET | Freq: Every day | ORAL | Status: DC | PRN

## 2018-05-16 MED ORDER — ZOLPIDEM TARTRATE 10 MG PO TABS: 10.0000 mg | ORAL_TABLET | Freq: Every day | ORAL | Status: DC

## 2018-05-16 MED ORDER — PHENYLEPHRINE HCL 10 MG/ML IJ SOLN
INTRAVENOUS | Status: DC | PRN
  Administered 2018-05-16: 50 ug/min via INTRAVENOUS

## 2018-05-16 MED ORDER — CEFAZOLIN SODIUM-DEXTROSE 2-4 GM/100ML-% IV SOLN
2.0000 g | INTRAVENOUS | Status: AC
Start: 2018-05-16 — End: 2018-05-16
  Administered 2018-05-16: 2 g via INTRAVENOUS
  Filled 2018-05-16: qty 100

## 2018-05-16 MED ORDER — NEBIVOLOL HCL 10 MG PO TABS
20.0000 mg | ORAL_TABLET | Freq: Every day | ORAL | Status: DC
  Administered 2018-05-17: 20 mg via ORAL
  Filled 2018-05-16: qty 2

## 2018-05-16 MED ORDER — PHENYLEPHRINE HCL 10 MG/ML IJ SOLN
INTRAMUSCULAR | Status: AC
Start: 2018-05-16 — End: ?
  Filled 2018-05-16: qty 1

## 2018-05-16 MED ORDER — EPHEDRINE SULFATE-NACL 50-0.9 MG/10ML-% IV SOSY
PREFILLED_SYRINGE | INTRAVENOUS | Status: DC | PRN
  Administered 2018-05-16 (×2): 10 mg via INTRAVENOUS

## 2018-05-16 MED ORDER — ALUM & MAG HYDROXIDE-SIMETH 200-200-20 MG/5ML PO SUSP: 15.0000 mL | ORAL | Status: DC | PRN

## 2018-05-16 MED ORDER — EPHEDRINE 5 MG/ML INJ
INTRAVENOUS | Status: AC
  Filled 2018-05-16: qty 10

## 2018-05-16 MED ORDER — LACTATED RINGERS IV SOLN
INTRAVENOUS | Status: DC
  Administered 2018-05-16 (×2): via INTRAVENOUS

## 2018-05-16 MED ORDER — ONDANSETRON HCL 4 MG/2ML IJ SOLN: 4.0000 mg | Freq: Four times a day (QID) | INTRAMUSCULAR | Status: DC | PRN

## 2018-05-16 MED ORDER — NEBIVOLOL HCL 20 MG PO TABS: 20.0000 mg | ORAL_TABLET | Freq: Every morning | ORAL | Status: DC

## 2018-05-16 MED ORDER — METOCLOPRAMIDE HCL 5 MG PO TABS: 5.0000 mg | ORAL_TABLET | Freq: Three times a day (TID) | ORAL | Status: DC | PRN

## 2018-05-16 MED ORDER — MIDAZOLAM HCL 5 MG/5ML IJ SOLN
INTRAMUSCULAR | Status: DC | PRN
  Administered 2018-05-16: 2 mg via INTRAVENOUS

## 2018-05-16 MED ORDER — AMPHETAMINE-DEXTROAMPHETAMINE 30 MG PO TABS: 15.0000 mg | ORAL_TABLET | Freq: Every day | ORAL | Status: DC | PRN

## 2018-05-16 MED ORDER — TRANEXAMIC ACID 1000 MG/10ML IV SOLN
1000.0000 mg | Freq: Once | INTRAVENOUS | Status: AC
  Administered 2018-05-16: 1000 mg via INTRAVENOUS
  Filled 2018-05-16: qty 1100

## 2018-05-16 MED ORDER — PROMETHAZINE HCL 25 MG/ML IJ SOLN: 6.2500 mg | INTRAMUSCULAR | Status: DC | PRN

## 2018-05-16 MED ORDER — ONDANSETRON HCL 4 MG PO TABS: 4.0000 mg | ORAL_TABLET | Freq: Four times a day (QID) | ORAL | Status: DC | PRN

## 2018-05-16 MED ORDER — IRBESARTAN 150 MG PO TABS
300.0000 mg | ORAL_TABLET | Freq: Every day | ORAL | Status: DC
  Administered 2018-05-16 – 2018-05-17 (×2): 300 mg via ORAL
  Filled 2018-05-16 (×2): qty 2

## 2018-05-16 SURGICAL SUPPLY — 35 items
ADH SKN CLS APL DERMABOND .7 (GAUZE/BANDAGES/DRESSINGS) ×1
BAG SPEC THK2 15X12 ZIP CLS (MISCELLANEOUS) ×1
BAG ZIPLOCK 12X15 (MISCELLANEOUS) ×3 IMPLANT
BLADE SAG 18X100X1.27 (BLADE) ×2 IMPLANT
CAPT HIP TOTAL 2 ×2 IMPLANT
COVER SURGICAL LIGHT HANDLE (MISCELLANEOUS) ×3 IMPLANT
DERMABOND ADVANCED (GAUZE/BANDAGES/DRESSINGS) ×2
DERMABOND ADVANCED .7 DNX12 (GAUZE/BANDAGES/DRESSINGS) IMPLANT
DRAPE POUCH INSTRU U-SHP 10X18 (DRAPES) ×5 IMPLANT
DRAPE STERI IOBAN 125X83 (DRAPES) ×2 IMPLANT
DRESSING AQUACEL AG SP 3.5X10 (GAUZE/BANDAGES/DRESSINGS) IMPLANT
DRSG AQUACEL AG SP 3.5X10 (GAUZE/BANDAGES/DRESSINGS) ×3
DURAPREP 26ML APPLICATOR (WOUND CARE) ×3 IMPLANT
ELECT REM PT RETURN 15FT ADLT (MISCELLANEOUS) ×3 IMPLANT
GLOVE BIOGEL PI IND STRL 7.0 (GLOVE) IMPLANT
GLOVE BIOGEL PI IND STRL 7.5 (GLOVE) ×1 IMPLANT
GLOVE BIOGEL PI IND STRL 8.5 (GLOVE) ×1 IMPLANT
GLOVE BIOGEL PI INDICATOR 7.0 (GLOVE) ×4
GLOVE BIOGEL PI INDICATOR 7.5 (GLOVE) ×8
GLOVE BIOGEL PI INDICATOR 8.5 (GLOVE) ×2
GLOVE ORTHO TXT STRL SZ7.5 (GLOVE) ×6 IMPLANT
GLOVE SURG ORTHO 8.0 STRL STRW (GLOVE) ×5 IMPLANT
GLOVE SURG SS PI 7.0 STRL IVOR (GLOVE) ×2 IMPLANT
GOWN STRL REUS W/TWL 2XL LVL3 (GOWN DISPOSABLE) ×2 IMPLANT
GOWN STRL REUS W/TWL LRG LVL3 (GOWN DISPOSABLE) ×7 IMPLANT
MANIFOLD NEPTUNE II (INSTRUMENTS) ×3 IMPLANT
PACK ANTERIOR HIP CUSTOM (KITS) ×2 IMPLANT
POSITIONER SURGICAL ARM (MISCELLANEOUS) ×3 IMPLANT
SUT MNCRL AB 4-0 PS2 18 (SUTURE) ×2 IMPLANT
SUT VIC AB 1 CT1 36 (SUTURE) ×9 IMPLANT
SUT VIC AB 2-0 CT1 27 (SUTURE) ×9
SUT VIC AB 2-0 CT1 TAPERPNT 27 (SUTURE) ×3 IMPLANT
TOWEL OR 17X26 10 PK STRL BLUE (TOWEL DISPOSABLE) ×6 IMPLANT
TRAY FOLEY CATH 14FR (SET/KITS/TRAYS/PACK) ×2 IMPLANT
YANKAUER SUCT BULB TIP 10FT TU (MISCELLANEOUS) ×2 IMPLANT

## 2018-05-16 NOTE — Anesthesia Procedure Notes (Addendum)
Date/Time: 05/16/2018 8:40 AM Performed by: Sharlette Dense, CRNA Oxygen Delivery Method: Simple face mask

## 2018-05-16 NOTE — Discharge Instructions (Signed)

## 2018-05-16 NOTE — Anesthesia Procedure Notes (Signed)
Spinal  Patient location during procedure: OR Start time: 05/16/2018 8:42 AM End time: 05/16/2018 8:46 AM Staffing Resident/CRNA: Sharlette Dense, CRNA Performed: resident/CRNA  Preanesthetic Checklist Completed: patient identified, site marked, surgical consent, pre-op evaluation, timeout performed, IV checked, risks and benefits discussed and monitors and equipment checked Spinal Block Patient position: sitting Prep: Betadine Patient monitoring: heart rate, continuous pulse ox and blood pressure Approach: midline Location: L3-4 Injection technique: single-shot Needle Needle type: Sprotte  Needle gauge: 24 G Needle length: 9 cm Additional Notes Kit expiration date 05/28/2019 and lot #[id number] Clear free flow of CSF, negative heme, negative paresthesia' Tolerated well and returned to supine position

## 2018-05-16 NOTE — Progress Notes (Signed)
PT Cancellation Note  Patient Details Name: ABIAGEAL BLOWE MRN: [id number] DOB: Mar 09, 1951   Cancelled Treatment:    Reason Eval/Treat Not Completed: Pain limiting ability to participate;Medical issues which prohibited therapy. Patient required IV medication  For pain and now nauseated. Begin PT in AM.   Claretha Cooper 05/16/2018, 4:52 PM

## 2018-05-16 NOTE — Transfer of Care (Signed)
Immediate Anesthesia Transfer of Care Note  Patient: Jeanne Smith  Procedure(s) Performed: LEFT HIP ANTERIOR APPROACH (Left Hip)  Patient Location: PACU  Anesthesia Type:Spinal  Level of Consciousness: awake, alert  and oriented  Airway & Oxygen Therapy: Patient Spontanous Breathing and Patient connected to nasal cannula oxygen  Post-op Assessment: Report given to RN and Post -op Vital signs reviewed and stable  Post vital signs: Reviewed and stable  Last Vitals:  Vitals Value Taken Time  BP 105/64 05/16/2018 10:21 AM  Temp    Pulse 80 05/16/2018 10:22 AM  Resp 22 05/16/2018 10:22 AM  SpO2 99 % 05/16/2018 10:22 AM  Vitals shown include unvalidated device data.  Last Pain:  Vitals:   05/16/18 0715  TempSrc:   PainSc: 6       Patients Stated Pain Goal: 4 (98/02/21 7981)  Complications: No apparent anesthesia complications

## 2018-05-16 NOTE — Op Note (Signed)
NAME:  Jeanne Smith NO.: [id number]      MEDICAL RECORD NO.: [id number]      FACILITY:  [facility]      PHYSICIAN:  Mauri Pole  DATE OF BIRTH:  1951/01/10     DATE OF PROCEDURE:  05/16/2018                                 OPERATIVE REPORT         PREOPERATIVE DIAGNOSIS: Left  hip osteoarthritis.      POSTOPERATIVE DIAGNOSIS:  Left hip osteoarthritis.      PROCEDURE:  Left total hip replacement through an anterior approach   utilizing DePuy THR system, component size 20mm pinnacle cup, a size 32+4 neutral   Altrex liner, a size 6 Hi Actis stem with a 32+5 delta ceramic   ball.      SURGEON:  Pietro Cassis. Alvan Dame, M.D.      ASSISTANT:  Danae Orleans, PA-C     ANESTHESIA:  Spinal.      SPECIMENS:  None.      COMPLICATIONS:  None.      BLOOD LOSS:  275 cc     DRAINS:  None.      INDICATION OF THE PROCEDURE:  Jeanne Smith is a 67 y.o. female who had   presented to office for evaluation of left hip pain.  Radiographs revealed   progressive degenerative changes with bone-on-bone   articulation of the  hip joint, including subchondral cystic changes and osteophytes.  The patient had painful limited range of   motion significantly affecting their overall quality of life and function.  The patient was failing to    respond to conservative measures including medications and/or injections and activity modification and at this point was ready   to proceed with more definitive measures.  Consent was obtained for   benefit of pain relief.  Specific risks of infection, DVT, component   failure, dislocation, neurovascular injury, and need for revision surgery were reviewed in the office as well discussion of   the anterior versus posterior approach were reviewed.     PROCEDURE IN DETAIL:  The patient was brought to operative theater.   Once adequate anesthesia, preoperative antibiotics, 2 gm Ancef, 1 gm of Tranexamic Acid, and 10 mg of  Decadron were administered, the patient was positioned supine on the [employer] table.  Once the patient was safely positioned with adequate padding of boney prominences we predraped out the hip, and used fluoroscopy to confirm orientation of the pelvis.      The left hip was then prepped and draped from proximal iliac crest to   mid thigh with a shower curtain technique.      Time-out was performed identifying the patient, planned procedure, and the appropriate extremity.     An incision was then made 2 cm lateral to the   anterior superior iliac spine extending over the orientation of the   tensor fascia lata muscle and sharp dissection was carried down to the   fascia of the muscle.      The fascia was then incised.  The muscle belly was identified and swept   laterally and retractor placed along the superior neck.  Following   cauterization of the circumflex vessels and removing some pericapsular  fat, a second cobra retractor was placed on the inferior neck.  A T-capsulotomy was made along the line of the   superior neck to the trochanteric fossa, then extended proximally and   distally.  Tag sutures were placed and the retractors were then placed   intracapsular.  We then identified the trochanteric fossa and   orientation of my neck cut and then made a neck osteotomy with the femur on traction.  The femoral   head was removed without difficulty or complication.  Traction was let   off and retractors were placed posterior and anterior around the   acetabulum.      The labrum and foveal tissue were debrided.  I began reaming with a 44 mm   reamer and reamed up to 49 mm reamer with good bony bed preparation and a 50 mm  cup was chosen.  The final 50 mm Pinnacle cup was then impacted under fluoroscopy to confirm the depth of penetration and orientation with respect to   Abduction and forward flexion.  A screw was placed into the ilium followed by the hole eliminator.  The final   32+4  neutral Altrex liner was impacted with good visualized rim fit.  The cup was positioned anatomically within the acetabular portion of the pelvis.      At this point, the femur was rolled to 100 degrees.  Further capsule was   released off the inferior aspect of the femoral neck.  I then   released the superior capsule proximally.  With the leg in a neutral position the hook was placed laterally   along the femur under the vastus lateralis origin and elevated manually and then held in position using the hook attachment on the bed.  The leg was then extended and adducted with the leg rolled to 100   degrees of external rotation.  Retractors were placed along the medial calcar and posteriorly over the greater trochanter.  Once the proximal femur was fully   exposed, I used a box osteotome to set orientation.  I then began   broaching with the starting chili pepper broach and passed this by hand and then broached up to 6.  With the 6 broach in place I chose a high offset neck and did several trial reductions.  The offset was appropriate, leg lengths   appeared to be equal best matched with the +5 head ball trial confirmed radiographically.   Given these findings, I went ahead and dislocated the hip, repositioned all   retractors and positioned the right hip in the extended and abducted position.  The final 6 Hi Actis stem was   chosen and it was impacted down to the level of neck cut.  Based on this   and the trial reductions, a final 32+5 delta ceramic ball was chosen and   impacted onto a clean and dry trunnion, and the hip was reduced.  The   hip had been irrigated throughout the case again at this point.  I did   reapproximate the superior capsular leaflet to the anterior leaflet   using #1 Vicryl.  The fascia of the   tensor fascia lata muscle was then reapproximated using #1 Vicryl and #0 Stratafix sutures.  The   remaining wound was closed with 2-0 Vicryl and running 4-0 Monocryl.   The hip  was cleaned, dried, and dressed sterilely using Dermabond and   Aquacel dressing.  The patient was then brought   to recovery room in  stable condition tolerating the procedure well.    Danae Orleans, PA-C was present for the entirety of the case involved from   preoperative positioning, perioperative retractor management, general   facilitation of the case, as well as primary wound closure as assistant.            Pietro Cassis Alvan Dame, M.D.        05/16/2018 10:03 AM

## 2018-05-17 ENCOUNTER — Encounter (HOSPITAL_COMMUNITY): Payer: Self-pay | Admitting: Orthopedic Surgery

## 2018-05-17 LAB — BASIC METABOLIC PANEL
ANION GAP: 7 (ref 5–15)
BUN: 12 mg/dL (ref 6–20)
CALCIUM: 8.9 mg/dL (ref 8.9–10.3)
CO2: 25 mmol/L (ref 22–32)
CREATININE: 0.61 mg/dL (ref 0.44–1.00)
Chloride: 108 mmol/L (ref 101–111)
Glucose, Bld: 140 mg/dL — ABNORMAL HIGH (ref 65–99)
Potassium: 4.2 mmol/L (ref 3.5–5.1)
Sodium: 140 mmol/L (ref 135–145)

## 2018-05-17 LAB — CBC
HCT: 37.1 % (ref 36.0–46.0)
Hemoglobin: 12.3 g/dL (ref 12.0–15.0)
MCH: 33 pg (ref 26.0–34.0)
MCHC: 33.2 g/dL (ref 30.0–36.0)
MCV: 99.5 fL (ref 78.0–100.0)
PLATELETS: 162 10*3/uL (ref 150–400)
RBC: 3.73 MIL/uL — ABNORMAL LOW (ref 3.87–5.11)
RDW: 13.4 % (ref 11.5–15.5)
WBC: 10.8 10*3/uL — AB (ref 4.0–10.5)

## 2018-05-17 MED ORDER — FERROUS SULFATE 325 (65 FE) MG PO TABS: 325.0000 mg | ORAL_TABLET | Freq: Three times a day (TID) | ORAL | 3 refills | Status: DC

## 2018-05-17 MED ORDER — DOCUSATE SODIUM 100 MG PO CAPS: 100.0000 mg | ORAL_CAPSULE | Freq: Two times a day (BID) | ORAL | 0 refills | Status: DC

## 2018-05-17 MED ORDER — POLYETHYLENE GLYCOL 3350 17 G PO PACK: 17.0000 g | PACK | Freq: Two times a day (BID) | ORAL | 0 refills | Status: DC

## 2018-05-17 MED ORDER — METHOCARBAMOL 500 MG PO TABS: 500.0000 mg | ORAL_TABLET | Freq: Four times a day (QID) | ORAL | 0 refills | Status: DC | PRN

## 2018-05-17 MED ORDER — HYDROCODONE-ACETAMINOPHEN 7.5-325 MG PO TABS: 1.0000 | ORAL_TABLET | ORAL | 0 refills | Status: DC | PRN

## 2018-05-17 MED ORDER — ASPIRIN 81 MG PO CHEW: 81.0000 mg | CHEWABLE_TABLET | Freq: Two times a day (BID) | ORAL | 0 refills | Status: AC

## 2018-05-17 NOTE — Progress Notes (Signed)
     Subjective: 1 Day Post-Op Procedure(s) (LRB): LEFT HIP ANTERIOR APPROACH (Left)   Seen by Dr. Alvan Dame. Patient reports pain as mild, pain controlled.  No events throughout the night. Ready to be discharged home if she does well with PT.    Objective:   VITALS:   Vitals:   05/17/18 0204 05/17/18 0518  BP: 116/82 115/71  Pulse: 84 74  Resp: 16 16  Temp: 97.9 F (36.6 C) 98 F (36.7 C)  SpO2: 100% 97%    Dorsiflexion/Plantar flexion intact Incision: dressing C/D/I No cellulitis present Compartment soft  LABS Recent Labs    05/17/18 0525  HGB 12.3  HCT 37.1  WBC 10.8*  PLT 162    Recent Labs    05/17/18 0525  NA 140  K 4.2  BUN 12  CREATININE 0.61  GLUCOSE 140*     Assessment/Plan: 1 Day Post-Op Procedure(s) (LRB): LEFT HIP ANTERIOR APPROACH (Left) Foley cath d/c'ed Advance diet Up with therapy D/C IV fluids Discharge home Follow up in 2 weeks at [facility] ([place] Follow up with OLIN,Merion Grimaldo D in 2 weeks.  Contact information:  [facility]) [address]. Daishaun Ayre   PAC  05/17/2018, 7:40 AM

## 2018-05-17 NOTE — Discharge Summary (Signed)
Physician Discharge Summary  Patient ID: Jeanne Smith MRN: [id number] DOB/AGE: 1951/10/08 67 y.o.  Admit date: 05/16/2018 Discharge date:  05/17/2018  Procedures:  Procedure(s) (LRB): LEFT HIP ANTERIOR APPROACH (Left)  Attending Physician:  Dr. Paralee Cancel   Admission Diagnoses:   Left hip primary OA / pain  Discharge Diagnoses:  Principal Problem:   S/P left THA, AA Active Problems:   S/P hip replacement  Past Medical History:  Diagnosis Date  . Anxiety   . Arthritis    oa  . Attention deficit disorder   . Dysrhythmia    episode of palpitations x 25 January 2018, stress test normal  . H/O seasonal allergies   . Hyperlipidemia   . Hypertension   . Hypothyroidism    thyroid nodules, off synthroid > 15 years  . Insomnia   . Osteoarthritis   . Osteopenia   . Thyroid nodule     HPI:    Jeanne Smith, 67 y.o. female, has a history of pain and functional disability in the left hip(s) due to arthritis and patient has failed non-surgical conservative treatments for greater than 12 weeks to include NSAID's and/or analgesics, corticosteriod injections and activity modification.  Onset of symptoms was gradual starting ~1 year ago with gradually worsening course since that time.The patient noted no past surgery on the left hip(s).  Patient currently rates pain in the left hip at 9 out of 10 with activity. Patient has night pain, worsening of pain with activity and weight bearing, trendelenberg gait, pain that interfers with activities of daily living and pain with passive range of motion. Patient has evidence of periarticular osteophytes and joint space narrowing by imaging studies. This condition presents safety issues increasing the risk of falls.There is no current active infection.  Risks, benefits and expectations were discussed with the patient.  Risks including but not limited to the risk of anesthesia, blood clots, nerve damage, blood vessel damage, failure of the  prosthesis, infection and up to and including death.  Patient understand the risks, benefits and expectations and wishes to proceed with surgery.   PCP: Elby Showers, MD   Discharged Condition: good  Hospital Course:  Patient underwent the above stated procedure on 05/16/2018. Patient tolerated the procedure well and brought to the recovery room in good condition and subsequently to the floor.  POD #1 BP: 115/71 ; Pulse: 74 ; Temp: 98 F (36.7 C) ; Resp: 16 Patient reports pain as mild, pain controlled.  No events throughout the night. Ready to be discharged home. Dorsiflexion/plantar flexion intact, incision: dressing C/D/I, no cellulitis present and compartment soft.   LABS  Basename    HGB     12.3  HCT     37.1    Discharge Exam: General appearance: alert, cooperative and no distress Extremities: Homans sign is negative, no sign of DVT, no edema, redness or tenderness in the calves or thighs and no ulcers, gangrene or trophic changes  Disposition:  Home with follow up in 2 weeks   Follow-up Information    Paralee Cancel, MD. Schedule an appointment as soon as possible for a visit in 2 weeks.   Specialty:  Orthopedic Surgery Contact information: [address] [place] [address] [phone]             Allergies as of 05/17/2018   No Known Allergies     Medication List    STOP taking these medications   acetaminophen 325 MG tablet Commonly known  as:  TYLENOL   estrogens (conjugated) 0.9 MG tablet Commonly known as:  PREMARIN   traMADol 50 MG tablet Commonly known as:  ULTRAM     TAKE these medications   ALPRAZolam 0.25 MG tablet Commonly known as:  XANAX Take 1 tablet (0.25 mg total) by mouth 2 (two) times daily as needed for anxiety.   amphetamine-dextroamphetamine 30 MG tablet Commonly known as:  ADDERALL Take 15-30 mg by mouth daily as needed (ATTENTION DEFICIT.). What changed:  Another medication with the same name was  removed. Continue taking this medication, and follow the directions you see here.   aspirin 81 MG chewable tablet Commonly known as:  ASPIRIN CHILDRENS Chew 1 tablet (81 mg total) by mouth 2 (two) times daily. Take for 4 weeks, then resume regular dose. Start taking on:  05/18/2018   CALCIUM CITRATE + D PO Take 1 tablet by mouth daily with lunch.   cholecalciferol 1000 units tablet Commonly known as:  VITAMIN D Take 5,000 Units by mouth daily with lunch.   docusate sodium 100 MG capsule Commonly known as:  COLACE Take 1 capsule (100 mg total) by mouth 2 (two) times daily.   DRY EYES OP Place 1 drop into both eyes as needed (dry eyes).   EQL OMEGA 3 FISH OIL 1200 MG Caps Take 1 capsule by mouth daily with lunch.   ferrous sulfate 325 (65 FE) MG tablet Commonly known as:  FERROUSUL Take 1 tablet (325 mg total) by mouth 3 (three) times daily with meals.   HYDROcodone-acetaminophen 7.5-325 MG tablet Commonly known as:  NORCO Take 1-2 tablets by mouth every 4 (four) hours as needed for moderate pain.   methocarbamol 500 MG tablet Commonly known as:  ROBAXIN Take 1 tablet (500 mg total) by mouth every 6 (six) hours as needed for muscle spasms.   multivitamin with minerals Tabs tablet Take 1 tablet by mouth daily with lunch.   Nebivolol HCl 20 MG Tabs Commonly known as:  BYSTOLIC Take 1 tablet (20 mg total) by mouth daily. What changed:  when to take this   olmesartan 40 MG tablet Commonly known as:  BENICAR TAKE 1 TABLET(40 MG) BY MOUTH DAILY What changed:  See the new instructions.   polyethylene glycol packet Commonly known as:  MIRALAX / GLYCOLAX Take 17 g by mouth 2 (two) times daily.   rosuvastatin 10 MG tablet Commonly known as:  CRESTOR Take 1 tablet (10 mg total) by mouth daily. What changed:  when to take this   SIMPLY SALINE 0.9 % Aers Generic drug:  Saline Place 1 spray into both nostrils 2 (two) times daily as needed (seasonal allergies.).   zolpidem  10 MG tablet Commonly known as:  AMBIEN TAKE 1 TABLET BY MOUTH EVERY NIGHT AT BEDTIME What changed:    how much to take  how to take this  when to take this        Signed: West Pugh. [place] Huntsman   PA-C  05/17/2018, 7:44 AM

## 2018-05-17 NOTE — Anesthesia Postprocedure Evaluation (Signed)
Anesthesia Post Note  Patient: Jeanne Smith  Procedure(s) Performed: LEFT HIP ANTERIOR APPROACH (Left Hip)     Patient location during evaluation: PACU Anesthesia Type: Spinal Level of consciousness: awake and alert Pain management: pain level controlled Vital Signs Assessment: post-procedure vital signs reviewed and stable Respiratory status: spontaneous breathing Cardiovascular status: stable Postop Assessment: spinal receding Anesthetic complications: no    Last Vitals:  Vitals:   05/17/18 0204 05/17/18 0518  BP: 116/82 115/71  Pulse: 84 74  Resp: 16 16  Temp: 36.6 C 36.7 C  SpO2: 100% 97%    Last Pain:  Vitals:   05/17/18 0518  TempSrc: Oral  PainSc:    Pain Goal: Patients Stated Pain Goal: 3 (05/17/18 0214)               Nolon Nations

## 2018-05-17 NOTE — Evaluation (Signed)
Physical Therapy Evaluation Patient Details Name: RUARI DUGGAN MRN: [id number] DOB: [birth year] Today's Date: 05/17/2018   History of Present Illness  67 yo female s/p L THA-direct anterior 05/16/18. Hx of R femur fx s/p IM nail 2016, R humerus fx-non op tx.   Clinical Impression  On eval, pt required Min assist for mobility. She walked ~65 feet with a RW. Pain 5/10 with activity. Anticipate pt will progress well. Plan is for d/c home later today after 2nd session.     Follow Up Recommendations Follow surgeon's recommendation for DC plan and follow-up therapies    Equipment Recommendations  None recommended by PT    Recommendations for Other Services       Precautions / Restrictions Precautions Precautions: Fall Restrictions Weight Bearing Restrictions: No Other Position/Activity Restrictions: WBAT      Mobility  Bed Mobility Overal bed mobility: Needs Assistance Bed Mobility: Supine to Sit     Supine to sit: Min guard;HOB elevated     General bed mobility comments: close guard for safety. VCs safety.   Transfers Overall transfer level: Needs assistance Equipment used: Rolling walker (2 wheeled) Transfers: Sit to/from Stand Sit to Stand: Min guard         General transfer comment: close guard for safety. VCs safety, hand placement, technique.   Ambulation/Gait Ambulation/Gait assistance: Min assist Gait Distance (Feet): 65 Feet Assistive device: Rolling walker (2 wheeled) Gait Pattern/deviations: Step-to pattern     General Gait Details: VCs safety, technique, sequence, proper use of RW. Mod cues for pt to roll walker instead of picking it up each time. Assist only given to maneuver RW appropriately  Stairs            Wheelchair Mobility    Modified Rankin (Stroke Patients Only)       Balance Overall balance assessment: Mild deficits observed, not formally tested                                           Pertinent  Vitals/Pain Pain Assessment: 0-10 Pain Score: 5  Pain Location: L hip Pain Descriptors / Indicators: Sore;Burning Pain Intervention(s): Monitored during session;Ice applied    Home Living Family/patient expects to be discharged to:: Private residence Living Arrangements: Alone Available Help at Discharge: Family(daughter) Type of Home: House Home Access: Stairs to enter Entrance Stairs-Rails: Psychiatric nurse of Steps: 3 Home Layout: Able to live on main level with bedroom/bathroom Home Equipment: Walker - 2 wheels;Bedside commode      Prior Function Level of Independence: Independent               Hand Dominance        Extremity/Trunk Assessment   Upper Extremity Assessment Upper Extremity Assessment: Overall WFL for tasks assessed    Lower Extremity Assessment Lower Extremity Assessment: Generalized weakness(s/p L THA)    Cervical / Trunk Assessment Cervical / Trunk Assessment: Normal  Communication   Communication: No difficulties  Cognition Arousal/Alertness: Awake/alert Behavior During Therapy: WFL for tasks assessed/performed Overall Cognitive Status: Within Functional Limits for tasks assessed                                        General Comments      Exercises Total Joint Exercises Ankle Circles/Pumps: AROM;Both;10 reps;Supine Quad Sets:  AROM;Both;10 reps;Supine Heel Slides: AAROM;Left;10 reps;Supine;AROM Hip ABduction/ADduction: AAROM;Left;10 reps;Supine;AROM Long Arc Quad: AROM;Left;10 reps;Seated Knee Flexion: AROM;Left;10 reps;Standing Marching in Standing: AROM;Both;10 reps;Standing General Exercises - Lower Extremity Heel Raises: AROM;Both;10 reps;Standing   Assessment/Plan    PT Assessment Patient needs continued PT services  PT Problem List Decreased strength;Decreased range of motion;Decreased mobility;Decreased activity tolerance;Decreased balance;Pain;Decreased knowledge of use of DME        PT Treatment Interventions DME instruction;Gait training;Functional mobility training;Therapeutic activities;Balance training;Patient/family education;Stair training;Therapeutic exercise    PT Goals (Current goals can be found in the Care Plan section)  Acute Rehab PT Goals Patient Stated Goal: regain PLOF.  PT Goal Formulation: With patient Time For Goal Achievement: 05/31/18 Potential to Achieve Goals: Good    Frequency 7X/week   Barriers to discharge        Co-evaluation               AM-PAC PT "6 Clicks" Daily Activity  Outcome Measure Difficulty turning over in bed (including adjusting bedclothes, sheets and blankets)?: A Little Difficulty moving from lying on back to sitting on the side of the bed? : A Little Difficulty sitting down on and standing up from a chair with arms (e.g., wheelchair, bedside commode, etc,.)?: A Little Help needed moving to and from a bed to chair (including a wheelchair)?: A Little Help needed walking in hospital room?: A Little Help needed climbing 3-5 steps with a railing? : A Little 6 Click Score: 18    End of Session Equipment Utilized During Treatment: Gait belt Activity Tolerance: Patient tolerated treatment well Patient left: in chair;with call bell/phone within reach   PT Visit Diagnosis: Pain;Other abnormalities of gait and mobility (R26.89) Pain - Right/Left: Left Pain - part of body: Hip(thigh)    Time: 1610-9604 PT Time Calculation (min) (ACUTE ONLY): 34 min   Charges:   PT Evaluation $PT Eval Low Complexity: 1 Low PT Treatments $Gait Training: 8-22 mins   PT G Codes:          Weston Anna, MPT Pager: [phone]

## 2018-05-17 NOTE — Progress Notes (Signed)
Physical Therapy Treatment Patient Details Name: Jeanne Smith MRN: [id number] DOB: May 14, 1951 Today's Date: 05/17/2018    History of Present Illness 67 yo female s/p L THA-direct anterior 05/16/18. Hx of R femur fx s/p IM nail 2016, R humerus fx-non op tx.     PT Comments    Progressing well with mobility. Reviewed gait training and stair training. Instructed pt to perform exercises 2x/day. All education completed. Okay to d/c from PT standpoint-made RN aware.    Follow Up Recommendations  Follow surgeon's recommendation for DC plan and follow-up therapies     Equipment Recommendations  None recommended by PT    Recommendations for Other Services       Precautions / Restrictions Precautions Precautions: Fall Restrictions Weight Bearing Restrictions: No Other Position/Activity Restrictions: WBAT    Mobility  Bed Mobility Overal bed mobility: Needs Assistance Bed Mobility: Supine to Sit     Supine to sit: Min guard;HOB elevated     General bed mobility comments: oob in recliner  Transfers Overall transfer level: Needs assistance Equipment used: Rolling walker (2 wheeled) Transfers: Sit to/from Stand Sit to Stand: Min guard         General transfer comment: close guard for safety. VCs safety, hand placement, technique.   Ambulation/Gait Ambulation/Gait assistance: Min guard Gait Distance (Feet): 125 Feet Assistive device: Rolling walker (2 wheeled) Gait Pattern/deviations: Step-to pattern     General Gait Details: VCs safety, technique, sequence, proper use of RW. close guard for safety.    Stairs Stairs: Yes Stairs assistance: Min guard Stair Management: Step to pattern;Sideways Number of Stairs: 2 General stair comments: up and over portable steps. VCs safety, technique, sequence. Pt used 2 hands on 1 rail. Close guard for safety.    Wheelchair Mobility    Modified Rankin (Stroke Patients Only)       Balance Overall balance assessment:  Mild deficits observed, not formally tested                                          Cognition Arousal/Alertness: Awake/alert Behavior During Therapy: WFL for tasks assessed/performed Overall Cognitive Status: Within Functional Limits for tasks assessed                                        Exercises Total Joint Exercises Ankle Circles/Pumps: AROM;Both;10 reps;Supine Quad Sets: AROM;Both;10 reps;Supine Heel Slides: AAROM;Left;10 reps;Supine;AROM Hip ABduction/ADduction: AAROM;Left;10 reps;Supine;AROM Long Arc Quad: AROM;Left;10 reps;Seated Knee Flexion: AROM;Left;10 reps;Standing Marching in Standing: AROM;Both;10 reps;Standing General Exercises - Lower Extremity Heel Raises: AROM;Both;10 reps;Standing    General Comments        Pertinent Vitals/Pain Pain Assessment: 0-10 Pain Score: 6  Pain Location: L hip/thigh Pain Descriptors / Indicators: Sore;Burning Pain Intervention(s): Monitored during session    Home Living                      Prior Function            PT Goals (current goals can now be found in the care plan section) Acute Rehab PT Goals Patient Stated Goal: regain PLOF.  PT Goal Formulation: With patient Time For Goal Achievement: 05/31/18 Potential to Achieve Goals: Good Progress towards PT goals: Progressing toward goals    Frequency    7X/week  PT Plan Current plan remains appropriate    Co-evaluation              AM-PAC PT "6 Clicks" Daily Activity  Outcome Measure  Difficulty turning over in bed (including adjusting bedclothes, sheets and blankets)?: A Little Difficulty moving from lying on back to sitting on the side of the bed? : A Little Difficulty sitting down on and standing up from a chair with arms (e.g., wheelchair, bedside commode, etc,.)?: A Little Help needed moving to and from a bed to chair (including a wheelchair)?: A Little Help needed walking in hospital room?: A  Little Help needed climbing 3-5 steps with a railing? : A Little 6 Click Score: 18    End of Session Equipment Utilized During Treatment: Gait belt Activity Tolerance: Patient tolerated treatment well Patient left: in chair;with call bell/phone within reach   PT Visit Diagnosis: Pain;Other abnormalities of gait and mobility (R26.89) Pain - Right/Left: Left Pain - part of body: Hip(thigh)     Time: 4235-3614 PT Time Calculation (min) (ACUTE ONLY): 13 min  Charges:  $Gait Training: 8-22 mins                    G Codes:          Weston Anna, MPT Pager: [phone]

## 2018-06-06 ENCOUNTER — Ambulatory Visit: Payer: Medicare Other | Admitting: Cardiology

## 2018-06-07 ENCOUNTER — Telehealth: Payer: Self-pay | Admitting: Internal Medicine

## 2018-06-07 MED ORDER — AMPHETAMINE-DEXTROAMPHETAMINE 30 MG PO TABS: 30.0000 mg | ORAL_TABLET | Freq: Every day | ORAL | 0 refills | Status: DC

## 2018-06-07 NOTE — Telephone Encounter (Signed)
Peggye Poon [phone]   [facility] -- [place]  Adderall  [name] called to say she needs refill on her adderall.

## 2018-06-19 DIAGNOSIS — Z471 Aftercare following joint replacement surgery: Secondary | ICD-10-CM | POA: Diagnosis not present

## 2018-06-19 DIAGNOSIS — Z96642 Presence of left artificial hip joint: Secondary | ICD-10-CM | POA: Diagnosis not present

## 2018-06-19 DIAGNOSIS — M1611 Unilateral primary osteoarthritis, right hip: Secondary | ICD-10-CM | POA: Diagnosis not present

## 2018-06-19 DIAGNOSIS — M25551 Pain in right hip: Secondary | ICD-10-CM | POA: Diagnosis not present

## 2018-06-28 DIAGNOSIS — L649 Androgenic alopecia, unspecified: Secondary | ICD-10-CM | POA: Diagnosis not present

## 2018-06-28 DIAGNOSIS — L650 Telogen effluvium: Secondary | ICD-10-CM | POA: Diagnosis not present

## 2018-06-28 DIAGNOSIS — L74510 Primary focal hyperhidrosis, axilla: Secondary | ICD-10-CM | POA: Diagnosis not present

## 2018-06-28 DIAGNOSIS — Z79899 Other long term (current) drug therapy: Secondary | ICD-10-CM | POA: Diagnosis not present

## 2018-06-28 DIAGNOSIS — L659 Nonscarring hair loss, unspecified: Secondary | ICD-10-CM | POA: Diagnosis not present

## 2018-07-12 ENCOUNTER — Other Ambulatory Visit: Payer: Self-pay

## 2018-07-12 MED ORDER — AMPHETAMINE-DEXTROAMPHETAMINE 30 MG PO TABS: 30.0000 mg | ORAL_TABLET | Freq: Every day | ORAL | 0 refills | Status: DC

## 2018-07-12 NOTE — Telephone Encounter (Signed)
Refilled x1. Past due for Medicare CPE. Please call and book

## 2018-07-12 NOTE — Telephone Encounter (Signed)
Patient called to request a refill.  Last refill 7/12 Last OV 04/11/18

## 2018-07-16 ENCOUNTER — Telehealth: Payer: Self-pay | Admitting: Internal Medicine

## 2018-07-16 ENCOUNTER — Encounter: Payer: Self-pay | Admitting: Internal Medicine

## 2018-07-16 MED ORDER — AMPHETAMINE-DEXTROAMPHET ER 15 MG PO CP24
ORAL_CAPSULE | ORAL | 0 refills | Status: DC
Start: 2018-07-16 — End: 2018-08-15

## 2018-07-16 NOTE — Telephone Encounter (Signed)
Patient calling for a refill on her Adderall, but looks like it was sent on Friday, 8/16 per Dr. Renold Genta.  Advised patient to call her pharmacy.  She hasn't contacted her pharmacy as of today.  Advised she needs to contact her pharmacy to see if they have this ready for her to pick up.

## 2018-07-16 NOTE — Telephone Encounter (Addendum)
Please send a new Rx for 2 tablets daily of Adderall 15mg . (instead of 30mg ).     [facility] is currently out of this Rx and doesn't know when they may be getting this in stock again.  They can get the 15 mg in for patient.    Thank you.    Pt is supposed to be on XR Adderall 30 mg daily. Have prescribed Adderall XR 15 mg #62  2 po daily with no refill. MJB

## 2018-07-22 NOTE — Telephone Encounter (Signed)
Left message to call back to schedule CPE. 

## 2018-07-24 ENCOUNTER — Telehealth (INDEPENDENT_AMBULATORY_CARE_PROVIDER_SITE_OTHER): Payer: Self-pay | Admitting: *Deleted

## 2018-07-24 ENCOUNTER — Telehealth: Payer: Self-pay | Admitting: Internal Medicine

## 2018-07-24 MED ORDER — ZOLPIDEM TARTRATE 10 MG PO TABS
ORAL_TABLET | ORAL | 2 refills | Status: DC
Start: 2018-07-24 — End: 2018-10-20

## 2018-07-24 NOTE — Telephone Encounter (Signed)
Called pt and she states that Lt hip was replaced and she would like to have Rt hip injected. Pt is scheduled 08/05/18 at 1415

## 2018-07-24 NOTE — Telephone Encounter (Addendum)
Patient called to book her CPE in November.  She is requesting a refill on her Ambien 10mg .    Pharmacy:  [facility].    Phone:  [phone]  Thank you.  escribed #30 with 2 refills

## 2018-08-05 ENCOUNTER — Ambulatory Visit (INDEPENDENT_AMBULATORY_CARE_PROVIDER_SITE_OTHER): Payer: Self-pay | Admitting: Physical Medicine and Rehabilitation

## 2018-08-06 DIAGNOSIS — H04123 Dry eye syndrome of bilateral lacrimal glands: Secondary | ICD-10-CM | POA: Diagnosis not present

## 2018-08-09 ENCOUNTER — Other Ambulatory Visit: Payer: Self-pay | Admitting: Internal Medicine

## 2018-08-15 ENCOUNTER — Telehealth: Payer: Self-pay | Admitting: Internal Medicine

## 2018-08-15 ENCOUNTER — Encounter: Payer: Self-pay | Admitting: Internal Medicine

## 2018-08-15 MED ORDER — AMPHETAMINE-DEXTROAMPHET ER 15 MG PO CP24
ORAL_CAPSULE | ORAL | 0 refills | Status: DC
Start: 2018-08-15 — End: 2018-09-23

## 2018-08-15 NOTE — Telephone Encounter (Signed)
Calling for a refill on her Adderall XR 15mg .  It is due for refill on 9/20.   Pharmacy:  [facility] [place].    Thank you.    Phone:  [phone]

## 2018-08-15 NOTE — Telephone Encounter (Signed)
She was given 62 tabs on August 20th. Have refilled it today. Not due again until October 19th

## 2018-09-13 ENCOUNTER — Other Ambulatory Visit: Payer: Self-pay | Admitting: Internal Medicine

## 2018-09-13 NOTE — Telephone Encounter (Signed)
The patient can't be on both of these. Which one is she taking? They are in the same class of anti-hypertensive drugs

## 2018-09-23 ENCOUNTER — Telehealth: Payer: Self-pay | Admitting: Internal Medicine

## 2018-09-23 MED ORDER — AMPHETAMINE-DEXTROAMPHET ER 15 MG PO CP24: ORAL_CAPSULE | ORAL | 0 refills | Status: DC

## 2018-09-23 NOTE — Telephone Encounter (Signed)
Refilled as requested. Must keep appt in November

## 2018-09-23 NOTE — Telephone Encounter (Signed)
Requesting refill on her Adderall XR 15mg  please.  Last filled 08/15/18.  Has not been seen since May.  Scheduled for CPE on 11/8.    Pharmacy:  [facility] [place].    Thank you.

## 2018-09-24 NOTE — Telephone Encounter (Signed)
Patient is taking Olmesartan 40mg  daily.  This was started by Dr. Martinique per the patient and Dr. Renold Genta increased to 40mg  and has been refilling it.

## 2018-09-25 ENCOUNTER — Other Ambulatory Visit: Payer: Self-pay | Admitting: Internal Medicine

## 2018-09-25 DIAGNOSIS — E785 Hyperlipidemia, unspecified: Secondary | ICD-10-CM

## 2018-09-25 DIAGNOSIS — E7800 Pure hypercholesterolemia, unspecified: Secondary | ICD-10-CM

## 2018-09-25 DIAGNOSIS — I10 Essential (primary) hypertension: Secondary | ICD-10-CM

## 2018-09-25 DIAGNOSIS — G4709 Other insomnia: Secondary | ICD-10-CM

## 2018-09-25 DIAGNOSIS — M1612 Unilateral primary osteoarthritis, left hip: Secondary | ICD-10-CM

## 2018-09-25 DIAGNOSIS — F439 Reaction to severe stress, unspecified: Secondary | ICD-10-CM

## 2018-09-25 DIAGNOSIS — F419 Anxiety disorder, unspecified: Secondary | ICD-10-CM

## 2018-09-25 DIAGNOSIS — R7302 Impaired glucose tolerance (oral): Secondary | ICD-10-CM

## 2018-09-25 DIAGNOSIS — F908 Attention-deficit hyperactivity disorder, other type: Secondary | ICD-10-CM

## 2018-09-30 ENCOUNTER — Other Ambulatory Visit (INDEPENDENT_AMBULATORY_CARE_PROVIDER_SITE_OTHER): Payer: Medicare Other | Admitting: Internal Medicine

## 2018-09-30 DIAGNOSIS — G4709 Other insomnia: Secondary | ICD-10-CM | POA: Diagnosis not present

## 2018-09-30 DIAGNOSIS — F908 Attention-deficit hyperactivity disorder, other type: Secondary | ICD-10-CM | POA: Diagnosis not present

## 2018-09-30 DIAGNOSIS — F439 Reaction to severe stress, unspecified: Secondary | ICD-10-CM

## 2018-09-30 DIAGNOSIS — I10 Essential (primary) hypertension: Secondary | ICD-10-CM | POA: Diagnosis not present

## 2018-09-30 DIAGNOSIS — E7800 Pure hypercholesterolemia, unspecified: Secondary | ICD-10-CM

## 2018-09-30 DIAGNOSIS — R7302 Impaired glucose tolerance (oral): Secondary | ICD-10-CM | POA: Diagnosis not present

## 2018-09-30 DIAGNOSIS — E785 Hyperlipidemia, unspecified: Secondary | ICD-10-CM

## 2018-09-30 DIAGNOSIS — Z0000 Encounter for general adult medical examination without abnormal findings: Secondary | ICD-10-CM

## 2018-09-30 DIAGNOSIS — M1612 Unilateral primary osteoarthritis, left hip: Secondary | ICD-10-CM

## 2018-09-30 DIAGNOSIS — F419 Anxiety disorder, unspecified: Secondary | ICD-10-CM

## 2018-09-30 LAB — POCT URINALYSIS DIPSTICK
APPEARANCE: NORMAL
Bilirubin, UA: NEGATIVE
Blood, UA: NEGATIVE
Glucose, UA: NEGATIVE
Ketones, UA: NEGATIVE
LEUKOCYTES UA: NEGATIVE
NITRITE UA: NEGATIVE
PH UA: 6.5 (ref 5.0–8.0)
PROTEIN UA: POSITIVE — AB
Spec Grav, UA: 1.015 (ref 1.010–1.025)
UROBILINOGEN UA: 0.2 U/dL

## 2018-09-30 NOTE — Addendum Note (Signed)
Addended by: Mady Haagensen on: 09/30/2018 11:01 AM   Modules accepted: Orders

## 2018-10-01 LAB — CBC WITH DIFFERENTIAL/PLATELET
BASOS PCT: 0.7 %
Basophils Absolute: 62 cells/uL (ref 0–200)
Eosinophils Absolute: 123 cells/uL (ref 15–500)
Eosinophils Relative: 1.4 %
HEMATOCRIT: 45.2 % — AB (ref 35.0–45.0)
HEMOGLOBIN: 15.5 g/dL (ref 11.7–15.5)
LYMPHS ABS: 1672 {cells}/uL (ref 850–3900)
MCH: 32.4 pg (ref 27.0–33.0)
MCHC: 34.3 g/dL (ref 32.0–36.0)
MCV: 94.6 fL (ref 80.0–100.0)
MPV: 12.3 fL (ref 7.5–12.5)
Monocytes Relative: 7.3 %
NEUTROS ABS: 6301 {cells}/uL (ref 1500–7800)
NEUTROS PCT: 71.6 %
Platelets: 180 10*3/uL (ref 140–400)
RBC: 4.78 10*6/uL (ref 3.80–5.10)
RDW: 11.7 % (ref 11.0–15.0)
Total Lymphocyte: 19 %
WBC: 8.8 10*3/uL (ref 3.8–10.8)
WBCMIX: 642 {cells}/uL (ref 200–950)

## 2018-10-01 LAB — LIPID PANEL
CHOLESTEROL: 199 mg/dL (ref <200)
HDL: 84 mg/dL (ref >50)
LDL Cholesterol (Calc): 96 mg/dL (calc)
Non-HDL Cholesterol (Calc): 115 mg/dL (calc) (ref <130)
Total CHOL/HDL Ratio: 2.4 (calc) (ref <5.0)
Triglycerides: 93 mg/dL (ref <150)

## 2018-10-01 LAB — COMPLETE METABOLIC PANEL WITH GFR
AG Ratio: 1.5 (calc) (ref 1.0–2.5)
ALBUMIN MSPROF: 4 g/dL (ref 3.6–5.1)
ALKALINE PHOSPHATASE (APISO): 83 U/L (ref 33–130)
ALT: 31 U/L — AB (ref 6–29)
AST: 45 U/L — ABNORMAL HIGH (ref 10–35)
BUN: 10 mg/dL (ref 7–25)
CO2: 28 mmol/L (ref 20–32)
CREATININE: 0.64 mg/dL (ref 0.50–0.99)
Calcium: 9.7 mg/dL (ref 8.6–10.4)
Chloride: 101 mmol/L (ref 98–110)
GFR, Est African American: 107 mL/min/{1.73_m2} (ref >=60)
GFR, Est Non African American: 92 mL/min/{1.73_m2} (ref >=60)
GLUCOSE: 112 mg/dL — AB (ref 65–99)
Globulin: 2.7 g/dL (calc) (ref 1.9–3.7)
Potassium: 4.6 mmol/L (ref 3.5–5.3)
Sodium: 137 mmol/L (ref 135–146)
Total Bilirubin: 0.6 mg/dL (ref 0.2–1.2)
Total Protein: 6.7 g/dL (ref 6.1–8.1)

## 2018-10-01 LAB — MICROALBUMIN / CREATININE URINE RATIO
CREATININE, URINE: 206 mg/dL (ref 20–275)
Microalb Creat Ratio: 43 mcg/mg creat — ABNORMAL HIGH (ref <30)
Microalb, Ur: 8.8 mg/dL

## 2018-10-01 LAB — HEMOGLOBIN A1C
Hgb A1c MFr Bld: 5.6 % of total Hgb (ref <5.7)
Mean Plasma Glucose: 114 (calc)
eAG (mmol/L): 6.3 (calc)

## 2018-10-01 LAB — TSH: TSH: 0.82 mIU/L (ref 0.40–4.50)

## 2018-10-04 ENCOUNTER — Ambulatory Visit (INDEPENDENT_AMBULATORY_CARE_PROVIDER_SITE_OTHER): Payer: Medicare Other | Admitting: Internal Medicine

## 2018-10-04 ENCOUNTER — Other Ambulatory Visit: Payer: Self-pay | Admitting: Internal Medicine

## 2018-10-04 ENCOUNTER — Encounter: Payer: Self-pay | Admitting: Internal Medicine

## 2018-10-04 VITALS — BP 170/110 | HR 81 | Ht 65.0 in | Wt 134.0 lb

## 2018-10-04 DIAGNOSIS — R7302 Impaired glucose tolerance (oral): Secondary | ICD-10-CM

## 2018-10-04 DIAGNOSIS — E7800 Pure hypercholesterolemia, unspecified: Secondary | ICD-10-CM | POA: Diagnosis not present

## 2018-10-04 DIAGNOSIS — E569 Vitamin deficiency, unspecified: Secondary | ICD-10-CM

## 2018-10-04 DIAGNOSIS — F439 Reaction to severe stress, unspecified: Secondary | ICD-10-CM

## 2018-10-04 DIAGNOSIS — J0100 Acute maxillary sinusitis, unspecified: Secondary | ICD-10-CM | POA: Diagnosis not present

## 2018-10-04 DIAGNOSIS — I10 Essential (primary) hypertension: Secondary | ICD-10-CM | POA: Diagnosis not present

## 2018-10-04 DIAGNOSIS — F419 Anxiety disorder, unspecified: Secondary | ICD-10-CM

## 2018-10-04 DIAGNOSIS — Z0000 Encounter for general adult medical examination without abnormal findings: Secondary | ICD-10-CM

## 2018-10-04 DIAGNOSIS — F908 Attention-deficit hyperactivity disorder, other type: Secondary | ICD-10-CM | POA: Diagnosis not present

## 2018-10-04 DIAGNOSIS — M1611 Unilateral primary osteoarthritis, right hip: Secondary | ICD-10-CM

## 2018-10-04 DIAGNOSIS — R5383 Other fatigue: Secondary | ICD-10-CM

## 2018-10-04 DIAGNOSIS — E785 Hyperlipidemia, unspecified: Secondary | ICD-10-CM

## 2018-10-04 DIAGNOSIS — G4709 Other insomnia: Secondary | ICD-10-CM

## 2018-10-04 MED ORDER — TRAMADOL HCL 50 MG PO TABS: 50.0000 mg | ORAL_TABLET | Freq: Two times a day (BID) | ORAL | 0 refills | Status: DC | PRN

## 2018-10-04 MED ORDER — DOXYCYCLINE HYCLATE 100 MG PO TABS: 100.0000 mg | ORAL_TABLET | Freq: Two times a day (BID) | ORAL | 0 refills | Status: DC

## 2018-10-07 ENCOUNTER — Other Ambulatory Visit: Payer: Medicare Other | Admitting: Internal Medicine

## 2018-10-07 ENCOUNTER — Telehealth: Payer: Self-pay | Admitting: Emergency Medicine

## 2018-10-07 NOTE — Telephone Encounter (Signed)
Called patient about lab appointment today that she has not shown up for. Patient did not answer. LVM to call back. Thanks.

## 2018-10-10 ENCOUNTER — Other Ambulatory Visit: Payer: Self-pay | Admitting: *Deleted

## 2018-10-10 MED ORDER — NEBIVOLOL HCL 20 MG PO TABS: 20.0000 mg | ORAL_TABLET | Freq: Every day | ORAL | 1 refills | Status: DC

## 2018-10-20 ENCOUNTER — Other Ambulatory Visit: Payer: Self-pay | Admitting: Internal Medicine

## 2018-10-26 ENCOUNTER — Encounter: Payer: Self-pay | Admitting: Internal Medicine

## 2018-10-26 NOTE — Patient Instructions (Addendum)
Monitor blood pressure at home and call if persistently elevated.  Take doxycycline as directed for respiratory infection.  Continue current medications.  Watch alcohol consumption when taking Adderall.  Please take care of yourself.  Follow-up in March.  Flu vaccine deferred due to illness

## 2018-10-26 NOTE — Progress Notes (Signed)
Subjective:    Patient ID: Jeanne Smith, female    DOB: 05/09/1951, 67 y.o.   MRN: [id number]  HPI 67 year old female in today for Medicare wellness exam, health maintenance exam and evaluation of medical issues.  History of attention deficit disorder treated with Adderall.  Warned about consuming alcohol with attention deficit medication.  In July 2016 she suffered a comminuted intertrochanteric fracture of right femur and comminuted fracture of right humeral head.  This occurred after a fall on concrete while holding a dog.  Intertrochanteric fracture required nailing by Dr. Karlton Lemon.  Recently had left hip arthroplasty by Dr. Alvan Dame in June.  Needs to have same surgery on right hip.  Has chronic right hip pain.  History of labile hypertension.  Continues with situational stress with husband's estate.  History of right thyroid nodule initially worked up in 1984 showing a hot nodule by thyroid scan.  It was aspirated in 1985 and was found to be benign.  Repeat thyroid scan in 1998 showed a hot nodule in the right lower lobe.  Ultrasound in May 1998 revealed a 3.8 solid hyperfunctioning nodule in the right lower lobe of the thyroid compatible with a functioning adenoma.  She started seeing Dr. Forde Dandy in June 1999.  May need to be reevaluated.  History of uterine fibroids and ovarian cyst followed by Dr. Nori Riis.  History of cervical dysplasia.  Tonsillectomy and adenoidectomy as a child.  Right lateral breast lymph node excision by Dr. Mendel Ryder in 1997 which was benign.  D&C after miscarriage in 1995.  Bilateral tubal ligation in 1995.  Breast augmentation 1998 by Dr. Dessie Coma.  Says that she may need to have one implant replaced.  Removal of endocervical polyp by Dr. Nori Riis February 1999.  She smoked occasionally in college.  Drinks wine nightly with her mother.  She is a former Writer.  Family history: Father died at age 41 with hypertension prostate and lung cancer.  Mother  healthy.  Paternal grandmother with thyroid disease.  Brother died at age 45 in 79 with history of alcoholism and experienced a spontaneous cardiac arrest.  Adult son who is a recovering alcoholic.  2 adult daughters.  No known drug allergies.  Hysterectomy 2006  Neck injury 2005.    Review of Systems complaining of green nasal discharge and sinus pressure.    Objective:   Physical Exam  Constitutional: She is oriented to person, place, and time. She appears well-developed and well-nourished. No distress.  HENT:  Head: Normocephalic and atraumatic.  Right Ear: External ear normal.  Left Ear: External ear normal.  Mouth/Throat: Oropharynx is clear and moist.  Eyes: Pupils are equal, round, and reactive to light. Conjunctivae and EOM are normal. Right eye exhibits no discharge. Left eye exhibits no discharge.  Neck: Neck supple. No JVD present. No thyromegaly present.  Cardiovascular: Normal rate, regular rhythm and normal heart sounds.  No murmur heard. Pulmonary/Chest: Effort normal and breath sounds normal. No respiratory distress. She has no wheezes. She has no rales.  Bilateral breast implants  Abdominal: Soft. Bowel sounds are normal. She exhibits no distension and no mass. There is no tenderness. There is no rebound and no guarding.  Genitourinary:  Genitourinary Comments: Deferred to GYN  Musculoskeletal: She exhibits no edema.  Lymphadenopathy:    She has no cervical adenopathy.  Neurological: She is alert and oriented to person, place, and time.  Skin: Skin is warm and dry. She is not diaphoretic.  Psychiatric: She has a  normal mood and affect. Her behavior is normal. Judgment and thought content normal.  Vitals reviewed.  Blood pressure is high today at 170/110.       Assessment & Plan:  Acute maxillary sinusitis with green nasal discharge-treat with doxycycline 100 mg twice daily for 10 days.  Elevated blood pressure-has element of office hypertension.   However I am concerned about blood pressure today but she is not feeling well.  Pure hypercholesterolemia-does not want to be on statin therapy  Insomnia treated with Ambien  Elevated serum glucose with history of impaired glucose tolerance  Anxiety  Situational stress  Attention deficit treated with Adderall which may be affecting blood pressure  Plan: I would like to follow-up with her in March.  She says she has chronic hip pain.  Is in need of right hip replacement.  Discussion today about alcohol consumption and taking Adderall.  Reminded about annual mammogram.  Subjective:   Patient presents for Medicare Annual/Subsequent preventive examination.  Review Past Medical/Family/Social: See above   Risk Factors  Current exercise habits: Not a lot of physical exercise Dietary issues discussed: Low-fat low carbohydrate  Cardiac risk factors: Hyperlipidemia, hypertension  Depression Screen  (Note: if answer to either of the following is "Yes", a more complete depression screening is indicated)   Over the past two weeks, have you felt down, depressed or hopeless? No  Over the past two weeks, have you felt little interest or pleasure in doing things? No Have you lost interest or pleasure in daily life? No Do you often feel hopeless? No Do you cry easily over simple problems? No   Activities of Daily Living  In your present state of health, do you have any difficulty performing the following activities?:   Driving? No  Managing money? No  Feeding yourself? No  Getting from bed to chair? No  Climbing a flight of stairs? No  Preparing food and eating?: No  Bathing or showering? No  Getting dressed: No  Getting to the toilet? No  Using the toilet:No  Moving around from place to place: No  In the past year have you fallen or had a near fall?:No  Are you sexually active? No  Do you have more than one partner? No   Hearing Difficulties: No  Do you often ask people to  speak up or repeat themselves? No  Do you experience ringing or noises in your ears? No  Do you have difficulty understanding soft or whispered voices? No  Do you feel that you have a problem with memory? No Do you often misplace items? No    Home Safety:  Do you have a smoke alarm at your residence? Yes Do you have grab bars in the bathroom?  Yes Do you have throw rugs in your house?  Yes   Cognitive Testing  Alert? Yes Normal Appearance?Yes  Oriented to person? Yes Place? Yes  Time? Yes  Recall of three objects? Yes  Can perform simple calculations? Yes  Displays appropriate judgment?Yes  Can read the correct time from a watch face?Yes   List the Names of Other Physician/Practitioners you currently use:  See referral list for the physicians patient is currently seeing.     Review of Systems: See above   Objective:     General appearance: Appears stated age and thin.  Looks fatigued. Head: Normocephalic, without obvious abnormality, atraumatic  Eyes: conj clear, EOMi PEERLA  Ears: normal TM's and external ear canals both ears  Nose: Nares normal.  Septum midline. Mucosa normal. No drainage or sinus tenderness.  Throat: lips, mucosa, and tongue normal; teeth and gums normal  Neck: no adenopathy, no carotid bruit, no JVD, supple, symmetrical, trachea midline and thyroid not enlarged, symmetric, no tenderness/mass/nodules  No CVA tenderness.  Lungs: clear to auscultation bilaterally  Breasts: normal appearance, no masses or tenderness, top of the pacemaker on left upper chest. Incision well-healed. It is tender.  Heart: regular rate and rhythm, S1, S2 normal, no murmur, click, rub or gallop  Abdomen: soft, non-tender; bowel sounds normal; no masses, no organomegaly  Musculoskeletal: ROM normal in all joints, no crepitus, no deformity, Normal muscle strengthen. Back  is symmetric, no curvature. Skin: Skin color, texture, turgor normal. No rashes or lesions  Lymph nodes:  Cervical, supraclavicular, and axillary nodes normal.  Neurologic: CN 2 -12 Normal, Normal symmetric reflexes. Normal coordination and gait  Psych: Alert & Oriented x 3, Mood appear stable.    Assessment:    Annual wellness medicare exam   Plan:    During the course of the visit the patient was educated and counseled about appropriate screening and preventive services including:   Annual mammogram  Flu vaccine deferred due to respiratory infection  Prevnar 13 given March 2019     Patient Instructions (the written plan) was given to the patient.  Medicare Attestation  I have personally reviewed:  The patient's medical and social history  Their use of alcohol, tobacco or illicit drugs  Their current medications and supplements  The patient's functional ability including ADLs,fall risks, home safety risks, cognitive, and hearing and visual impairment  Diet and physical activities  Evidence for depression or mood disorders  The patient's weight, height, BMI, and visual acuity have been recorded in the chart. I have made referrals, counseling, and provided education to the patient based on review of the above and I have provided the patient with a written personalized care plan for preventive services.

## 2018-10-29 ENCOUNTER — Other Ambulatory Visit: Payer: Self-pay

## 2018-10-29 MED ORDER — AMPHETAMINE-DEXTROAMPHET ER 15 MG PO CP24: ORAL_CAPSULE | ORAL | 0 refills | Status: DC

## 2018-10-29 NOTE — Telephone Encounter (Signed)
Patient called to schedule a lab appointment that she missed in November she said her sister had to have emergency surgery. She would like a refill on ADDERALL, last refill 09/23/2018.

## 2018-11-07 ENCOUNTER — Other Ambulatory Visit: Payer: Medicare Other | Admitting: Internal Medicine

## 2018-11-12 ENCOUNTER — Other Ambulatory Visit: Payer: Self-pay | Admitting: Internal Medicine

## 2018-11-28 ENCOUNTER — Other Ambulatory Visit: Payer: Self-pay

## 2018-11-28 MED ORDER — AMPHETAMINE-DEXTROAMPHET ER 15 MG PO CP24: ORAL_CAPSULE | ORAL | 0 refills | Status: DC

## 2018-11-28 NOTE — Telephone Encounter (Addendum)
Patient called to request a refill on ADDERALL. Last refill 10/29/2018.   Called in XR 15 mg #62

## 2019-01-03 ENCOUNTER — Other Ambulatory Visit: Payer: Self-pay

## 2019-01-03 DIAGNOSIS — L650 Telogen effluvium: Secondary | ICD-10-CM | POA: Diagnosis not present

## 2019-01-03 DIAGNOSIS — L649 Androgenic alopecia, unspecified: Secondary | ICD-10-CM | POA: Diagnosis not present

## 2019-01-03 NOTE — Telephone Encounter (Signed)
Scheduled an appointment for Tuesday, she couldn't come in on Monday. She said can you go head and refill her medication without changing it, I told her I would ask but you need to see her before we can change her meds.

## 2019-01-03 NOTE — Telephone Encounter (Signed)
We need to see her before refilling as BP was high last visit

## 2019-01-03 NOTE — Telephone Encounter (Signed)
Patient called she would like a refill on her ADDERALL she said last time she had trouble getting it refilled bc she only needs 30 tablets she said no more than 30, but she would like to go back to the ADDERALL 30MG  tablets so she can break it half if she needs to.

## 2019-01-03 NOTE — Telephone Encounter (Signed)
Her BP was very high in November. Needs BP check and OV before we refill. Cannot continue to refill if BP running high.

## 2019-01-06 ENCOUNTER — Other Ambulatory Visit: Payer: Self-pay

## 2019-01-06 MED ORDER — NEBIVOLOL HCL 20 MG PO TABS: 20.0000 mg | ORAL_TABLET | Freq: Every day | ORAL | 0 refills | Status: DC

## 2019-01-07 ENCOUNTER — Ambulatory Visit (INDEPENDENT_AMBULATORY_CARE_PROVIDER_SITE_OTHER): Payer: Medicare Other | Admitting: Internal Medicine

## 2019-01-07 VITALS — BP 170/100 | HR 106 | Ht 65.0 in | Wt 137.0 lb

## 2019-01-07 DIAGNOSIS — I10 Essential (primary) hypertension: Secondary | ICD-10-CM

## 2019-01-07 DIAGNOSIS — F411 Generalized anxiety disorder: Secondary | ICD-10-CM | POA: Diagnosis not present

## 2019-01-07 MED ORDER — HYDROCHLOROTHIAZIDE 25 MG PO TABS: 25.0000 mg | ORAL_TABLET | Freq: Every day | ORAL | 0 refills | Status: DC

## 2019-01-07 MED ORDER — ALPRAZOLAM 0.25 MG PO TABS: 0.2500 mg | ORAL_TABLET | Freq: Two times a day (BID) | ORAL | 0 refills | Status: DC | PRN

## 2019-01-07 MED ORDER — AMPHETAMINE-DEXTROAMPHET ER 15 MG PO CP24: ORAL_CAPSULE | ORAL | 0 refills | Status: DC

## 2019-01-07 MED ORDER — TRAMADOL HCL 50 MG PO TABS: 50.0000 mg | ORAL_TABLET | Freq: Two times a day (BID) | ORAL | 0 refills | Status: DC

## 2019-01-07 MED ORDER — AMLODIPINE BESYLATE 5 MG PO TABS: 5.0000 mg | ORAL_TABLET | Freq: Every day | ORAL | 0 refills | Status: DC

## 2019-01-07 NOTE — Progress Notes (Signed)
   Subjective:    Patient ID: Jeanne Smith, female    DOB: Jan 01, 1951, 68 y.o.   MRN: [id number]  HPI 68 year old Female in for follow up on attention deficit issues, anxiety, and elevated blood pressure. Still has situational stress.Son in [place] has been drinking again. Daughter who lives here near pt has been helpful. Still trying to settle her husband's estate and catalog some of his trains he collected. Her mother became very ill. She lives here in town and is doing better. Pt still feels she needs ADD medication. I am concerned about her BP remaining elevated. She still has musculoskeletal pain.She stopped Bystolic. Did not like the way it made her feel.    Review of Systems see above     Objective:   Physical Exam  BP 170/100. Pulse 106. Wt 137 lbs.      Assessment & Plan:  Essential HTN-  Take HCTZ, Norvasc and Benicar. Follow up in 4 weeks.  Anxiety- refill Xanax 0.25 mg twice daily prn anxiety.  Insomnia -refill Ambien- caution alcohol consumption with this  Chronic hip pain- consider Tramadol for pain  Attention Deficit- Take Adderall XR 15 mg one or two q am  Feels she cannot focus without this. May refer to [facility] if this is not working.  Situational stress- suggest counseling

## 2019-01-20 ENCOUNTER — Ambulatory Visit: Payer: Medicare Other | Admitting: Internal Medicine

## 2019-01-21 ENCOUNTER — Ambulatory Visit (INDEPENDENT_AMBULATORY_CARE_PROVIDER_SITE_OTHER): Payer: Medicare Other

## 2019-01-21 ENCOUNTER — Encounter (INDEPENDENT_AMBULATORY_CARE_PROVIDER_SITE_OTHER): Payer: Self-pay | Admitting: Orthopaedic Surgery

## 2019-01-21 ENCOUNTER — Ambulatory Visit (INDEPENDENT_AMBULATORY_CARE_PROVIDER_SITE_OTHER): Payer: Self-pay

## 2019-01-21 ENCOUNTER — Ambulatory Visit (INDEPENDENT_AMBULATORY_CARE_PROVIDER_SITE_OTHER): Payer: Medicare Other | Admitting: Orthopaedic Surgery

## 2019-01-21 ENCOUNTER — Encounter: Payer: Self-pay | Admitting: Internal Medicine

## 2019-01-21 VITALS — BP 123/89 | HR 112 | Resp 16 | Ht 65.0 in | Wt 135.0 lb

## 2019-01-21 DIAGNOSIS — M25531 Pain in right wrist: Secondary | ICD-10-CM | POA: Insufficient documentation

## 2019-01-21 DIAGNOSIS — M79641 Pain in right hand: Secondary | ICD-10-CM | POA: Diagnosis not present

## 2019-01-21 NOTE — Progress Notes (Signed)
Office Visit Note   Patient: Jeanne Smith           Date of Birth: [birth year]           MRN: [id number] Visit Date: 01/21/2019              Requested by: Elby Showers, MD [address] [place], [place] [address] PCP: Elby Showers, MD   Assessment & Plan: Visit Diagnoses:  1. Pain in right wrist   2. Pain in right hand     Plan: Impacted right distal radius fracture.  We will plan ORIF Thursday.  Apply splint and keep hand elevated to decrease swelling  Follow-Up Instructions: No follow-ups on file.   Orders:  Orders Placed This Encounter  Procedures  . XR Hand Complete Right  . XR Wrist Complete Right   No orders of the defined types were placed in this encounter.     Procedures: No procedures performed   Clinical Data: No additional findings.   Subjective: Chief Complaint  Patient presents with  . Right Hand - Injury  . Hand Pain    right hand pain   Jeanne Smith is a 68 year old female who presents with right hand pain, bruising, swelling, redness, limited range of motion x 2 days. Patient is uncertain how this happened. She took an Ambien, went to bed and woke up the next morning unable to move her hand.  First noted swelling Sunday morning which has persisted over the last 48 hours.  Does not member any specific injury or trauma but relates that Ambien may have altered her memory.  HPI  Review of Systems  Constitutional: Negative for fatigue.  HENT: Negative for trouble swallowing.   Eyes: Negative for pain.  Respiratory: Negative for shortness of breath.   Cardiovascular: Positive for leg swelling.  Gastrointestinal: Positive for constipation.  Endocrine: Negative for cold intolerance.  Genitourinary: Negative for difficulty urinating.  Musculoskeletal: Positive for gait problem.  Skin: Negative for rash.  Allergic/Immunologic: Negative for food allergies.  Neurological: Positive for weakness. Negative for numbness.  Hematological:  Does not bruise/bleed easily.  Psychiatric/Behavioral: Positive for sleep disturbance.     Objective: Vital Signs: BP 123/89 (BP Location: Left Arm, Patient Position: Sitting, Cuff Size: Normal)   Pulse (!) 112   Resp 16   Ht 5\' 5"  (1.651 m)   Wt 135 lb (61.2 kg)   BMI 22.47 kg/m   Physical Exam Constitutional:      Appearance: She is well-developed.  Eyes:     Pupils: Pupils are equal, round, and reactive to light.  Pulmonary:     Effort: Pulmonary effort is normal.  Skin:    General: Skin is warm and dry.  Neurological:     Mental Status: She is alert and oriented to person, place, and time.  Psychiatric:        Behavior: Behavior normal.     Ortho Exam awake alert and oriented x3.  Comfortable sitting.  Diffuse edema of all his digits right hand and wrist.  Swelling extends to about the mid forearm.  Good capillary refill to fingers normal sensation.  Does have tenderness over the distal radius.  Slight radial deviation  Specialty Comments:  No specialty comments available.  Imaging: No results found.   PMFS History: Patient Active Problem List   Diagnosis Date Noted  . Pain in right wrist 01/21/2019  . Pain in right hand 01/21/2019  . 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  S/P hip replacement 05/16/2018  . Pure hypercholesterolemia 12/24/2017  . Intertrochanteric fracture of right femur ([place]) 06/15/2015  . Comminuted right humeral fracture 06/15/2015  . HTN (hypertension) 06/15/2015   Past Medical History:  Diagnosis Date  . Anxiety   . Arthritis    oa  . Attention deficit disorder   . Dysrhythmia    episode of palpitations x 25 January 2018, stress test normal  . H/O seasonal allergies   . Hyperlipidemia   . Hypertension   . Hypothyroidism    thyroid nodules, off synthroid > 15 years  . Insomnia   . Osteoarthritis   . Osteopenia   . Thyroid nodule     Family History  Problem Relation Age of Onset  . Heart failure Mother   . Hypertension Father   .  Cancer Father   . Stroke Father   . Heart disease Brother   . Alcoholism Brother     Past Surgical History:  Procedure Laterality Date  . ABDOMINAL HYSTERECTOMY    . CONVERSION TO TOTAL HIP Left 05/16/2018   Procedure: LEFT HIP ANTERIOR APPROACH;  Surgeon: Paralee Cancel, MD;  Location: [facility];  Service: Orthopedics;  Laterality: Left;  . FEMUR IM NAIL Right 06/16/2015   Procedure: INTRAMEDULLARY (IM) NAIL FEMORAL;  Surgeon: Renette Butters, MD;  Location: [facility];  Service: Orthopedics;  Laterality: Right;  . HIP SURGERY    . JOINT REPLACEMENT    . TUBAL LIGATION     Social History   Occupational History  . Not on file  Tobacco Use  . Smoking status: Former Smoker    Packs/day: 0.25    Types: Cigarettes    Last attempt to quit: 11/27/2005    Years since quitting: 13.1  . Smokeless tobacco: Never Used  Substance and Sexual Activity  . Alcohol use: Yes    Alcohol/week: 7.0 standard drinks    Types: 7 Shots of liquor per week    Comment: scotch daily  . Drug use: No  . Sexual activity: Not on file

## 2019-01-21 NOTE — Progress Notes (Signed)
Patient was notified of Dr. Verlene Mayer instructions.

## 2019-01-21 NOTE — Patient Instructions (Signed)
Take Xanax 0.25 mg prn anxiety. Take Adderall for Attention issues. Caution with Ambien and drinking wine at night. BP control needs to be better. Take Norvasc Benicar and HCTZ. Follow up in 4 weeks.

## 2019-01-21 NOTE — Progress Notes (Signed)
Please call her. Must stop Ambien

## 2019-01-23 ENCOUNTER — Other Ambulatory Visit (INDEPENDENT_AMBULATORY_CARE_PROVIDER_SITE_OTHER): Payer: Self-pay | Admitting: Orthopedic Surgery

## 2019-01-23 ENCOUNTER — Encounter: Payer: Self-pay | Admitting: Orthopaedic Surgery

## 2019-01-23 DIAGNOSIS — S52551A Other extraarticular fracture of lower end of right radius, initial encounter for closed fracture: Secondary | ICD-10-CM | POA: Diagnosis not present

## 2019-01-23 DIAGNOSIS — S52571A Other intraarticular fracture of lower end of right radius, initial encounter for closed fracture: Secondary | ICD-10-CM | POA: Diagnosis not present

## 2019-01-23 DIAGNOSIS — X58XXXA Exposure to other specified factors, initial encounter: Secondary | ICD-10-CM | POA: Diagnosis not present

## 2019-01-23 DIAGNOSIS — G8918 Other acute postprocedural pain: Secondary | ICD-10-CM | POA: Diagnosis not present

## 2019-01-23 DIAGNOSIS — Y999 Unspecified external cause status: Secondary | ICD-10-CM | POA: Diagnosis not present

## 2019-01-23 MED ORDER — OXYCODONE HCL 5 MG PO TABS: 5.0000 mg | ORAL_TABLET | Freq: Four times a day (QID) | ORAL | 0 refills | Status: DC | PRN

## 2019-01-24 ENCOUNTER — Telehealth (INDEPENDENT_AMBULATORY_CARE_PROVIDER_SITE_OTHER): Payer: Self-pay | Admitting: Orthopaedic Surgery

## 2019-01-24 NOTE — Telephone Encounter (Signed)
called

## 2019-01-24 NOTE — Telephone Encounter (Signed)
Patient called stating her brace is a little tight due to swelling and is asking if she can "loosen it a little bit."   Patient requested a return call [phone]

## 2019-01-24 NOTE — Telephone Encounter (Signed)
I know you wanted to call her earlier...do you want to discuss this with her? Or would you like me to call her?

## 2019-01-29 ENCOUNTER — Ambulatory Visit (INDEPENDENT_AMBULATORY_CARE_PROVIDER_SITE_OTHER): Payer: Medicare Other | Admitting: Orthopaedic Surgery

## 2019-01-29 ENCOUNTER — Encounter (INDEPENDENT_AMBULATORY_CARE_PROVIDER_SITE_OTHER): Payer: Self-pay | Admitting: Orthopaedic Surgery

## 2019-01-29 VITALS — BP 122/94 | HR 104 | Ht 65.0 in | Wt 135.0 lb

## 2019-01-29 DIAGNOSIS — S62101A Fracture of unspecified carpal bone, right wrist, initial encounter for closed fracture: Secondary | ICD-10-CM | POA: Insufficient documentation

## 2019-01-29 DIAGNOSIS — S62101D Fracture of unspecified carpal bone, right wrist, subsequent encounter for fracture with routine healing: Secondary | ICD-10-CM

## 2019-01-29 NOTE — Progress Notes (Signed)
Office Visit Note   Patient: Jeanne Smith           Date of Birth: [birth year]           MRN: [id number] Visit Date: 01/29/2019              Requested by: Elby Showers, MD [address] [place], [place] [address] PCP: Elby Showers, MD   Assessment & Plan: Visit Diagnoses:  1. Closed fracture of right wrist with routine healing, subsequent encounter     Plan: 6 days status post open reduction internal fixation of a nonarticular transverse right distal radius fracture.  Doing well.  No complications.  Continue with splint.  Work on range of motion. next office 1 week to remove stitches  Follow-Up Instructions: Return in about 1 week (around 02/05/2019).   Orders:  No orders of the defined types were placed in this encounter.  No orders of the defined types were placed in this encounter.     Procedures: No procedures performed   Clinical Data: No additional findings.   Subjective: Chief Complaint  Patient presents with  . Right Wrist - Routine Post Op    Disal radius ORIF 01/23/2019  Patient presents today 6 days post op following right distal radius fracture ORIF. She is doing well. She is taking oxycodone in the morning.   HPI  Review of Systems   Objective: Vital Signs: BP (!) 122/94   Pulse (!) 104   Ht 5\' 5"  (1.651 m)   Wt 135 lb (61.2 kg)   BMI 22.47 kg/m   Physical Exam  Ortho Exam dressing removed from right wrist.  Incision is healing without problem.  Some ecchymosis as expected. minimal swelling of fingers.  Neurovascular exam intact.  No evidence of infection.  Specialty Comments:  No specialty comments available.  Imaging: No results found.   PMFS History: Patient Active Problem List   Diagnosis Date Noted  . Closed fracture of right wrist 01/29/2019  . Pain in right wrist 01/21/2019  . Pain in right hand 01/21/2019  . 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 S/P hip replacement 05/16/2018  . Pure hypercholesterolemia 12/24/2017    . Intertrochanteric fracture of right femur ([place]) 06/15/2015  . Comminuted right humeral fracture 06/15/2015  . HTN (hypertension) 06/15/2015   Past Medical History:  Diagnosis Date  . Anxiety   . Arthritis    oa  . Attention deficit disorder   . Dysrhythmia    episode of palpitations x 25 January 2018, stress test normal  . H/O seasonal allergies   . Hyperlipidemia   . Hypertension   . Hypothyroidism    thyroid nodules, off synthroid > 15 years  . Insomnia   . Osteoarthritis   . Osteopenia   . Thyroid nodule     Family History  Problem Relation Age of Onset  . Heart failure Mother   . Hypertension Father   . Cancer Father   . Stroke Father   . Heart disease Brother   . Alcoholism Brother     Past Surgical History:  Procedure Laterality Date  . ABDOMINAL HYSTERECTOMY    . CONVERSION TO TOTAL HIP Left 05/16/2018   Procedure: LEFT HIP ANTERIOR APPROACH;  Surgeon: Paralee Cancel, MD;  Location: [facility];  Service: Orthopedics;  Laterality: Left;  . FEMUR IM NAIL Right 06/16/2015   Procedure: INTRAMEDULLARY (IM) NAIL FEMORAL;  Surgeon: Renette Butters, MD;  Location: [facility];  Service: Orthopedics;  Laterality:  Right;  [facility] HIP SURGERY    . JOINT REPLACEMENT    . TUBAL LIGATION     Social History   Occupational History  . Not on file  Tobacco Use  . Smoking status: Former Smoker    Packs/day: 0.25    Types: Cigarettes    Last attempt to quit: 11/27/2005    Years since quitting: 13.1  . Smokeless tobacco: Never Used  Substance and Sexual Activity  . Alcohol use: Yes    Alcohol/week: 7.0 standard drinks    Types: 7 Shots of liquor per week    Comment: scotch daily  . Drug use: No  . Sexual activity: Not on file

## 2019-02-03 ENCOUNTER — Ambulatory Visit: Payer: Medicare Other | Admitting: Internal Medicine

## 2019-02-05 ENCOUNTER — Other Ambulatory Visit: Payer: Self-pay | Admitting: Internal Medicine

## 2019-02-05 MED ORDER — AMLODIPINE BESYLATE 5 MG PO TABS: 5.0000 mg | ORAL_TABLET | Freq: Every day | ORAL | 0 refills | Status: DC

## 2019-02-05 MED ORDER — HYDROCHLOROTHIAZIDE 25 MG PO TABS: 25.0000 mg | ORAL_TABLET | Freq: Every day | ORAL | 0 refills | Status: DC

## 2019-02-05 NOTE — Telephone Encounter (Signed)
She missed appt for Prevnar. Has had wrist fracture. How is BP at home on this med? When is able to return for follow up on BP and get vaccine?

## 2019-02-05 NOTE — Telephone Encounter (Signed)
She said the systolic has improved but the diastolic is about the same. She said she really hasn't been checking her BP at home but at one of her appointments it was 122/94 she scheduled an appt for Tuesday. She said she her legs have been swelling.

## 2019-02-07 ENCOUNTER — Other Ambulatory Visit: Payer: Self-pay

## 2019-02-07 ENCOUNTER — Encounter (INDEPENDENT_AMBULATORY_CARE_PROVIDER_SITE_OTHER): Payer: Self-pay | Admitting: Orthopaedic Surgery

## 2019-02-07 ENCOUNTER — Ambulatory Visit (INDEPENDENT_AMBULATORY_CARE_PROVIDER_SITE_OTHER): Payer: Medicare Other

## 2019-02-07 ENCOUNTER — Ambulatory Visit (INDEPENDENT_AMBULATORY_CARE_PROVIDER_SITE_OTHER): Payer: Medicare Other | Admitting: Orthopaedic Surgery

## 2019-02-07 VITALS — BP 118/70 | HR 101 | Ht 65.0 in | Wt 135.0 lb

## 2019-02-07 DIAGNOSIS — Z9889 Other specified postprocedural states: Secondary | ICD-10-CM

## 2019-02-07 DIAGNOSIS — M25531 Pain in right wrist: Secondary | ICD-10-CM

## 2019-02-07 NOTE — Progress Notes (Signed)
Office Visit Note   Patient: Jeanne Smith           Date of Birth: December 30, 1950           MRN: [id number] Visit Date: 02/07/2019              Requested by: Elby Showers, MD [address] [place], [place] [address] PCP: Elby Showers, MD   Assessment & Plan: Visit Diagnoses:  1. Pain in right wrist     Plan: 2-week status post open reduction and internal fixation of right distal radius fracture doing well.  X-rays reveal nice position of the fracture and the internal fixation plate.  Will continue to wear the volar wrist splint when she is out and about but to work on pronation supination flexion extension exercises.  No obvious complications.  Office 3 weeks. follows up with Dr. Renold Genta in the next several weeks to discuss her osteopenia.  She has had a fracture of her proximal right humerus and now her right wrist and she might be a candidate for something other than vitamin D and calcium  Follow-Up Instructions: Return in about 3 weeks (around 02/28/2019).   Orders:  Orders Placed This Encounter  Procedures  . XR Wrist 2 Views Right   No orders of the defined types were placed in this encounter.     Procedures: No procedures performed   Clinical Data: No additional findings.   Subjective: Chief Complaint  Patient presents with  . Right Wrist - Routine Post Op    Right distal radius ORIF 01/23/19  Patient presents today a little over two weeks out from right ORIF of distal radius fracture. She said that she is improving each day, but still has pain. She is taking ibuprofen twice daily to take the edge off. She does admit that she has probably been doing too much.   HPI  Review of Systems   Objective: Vital Signs: BP 118/70   Pulse (!) 101   Ht 5\' 5"  (1.651 m)   Wt 135 lb (61.2 kg)   BMI 22.47 kg/m   Physical Exam  Ortho Exam awake alert and oriented x3.  Comfortable sitting.  Nylon stitches removed from her right wrist and Steri-Strips applied.   Minimal swelling.  Neurovascular exam intact.  Can make a full fist.  Has almost full pronation and supination and excellent flexion extension at just 2 weeks. Specialty Comments:  No specialty comments available.  Imaging: Xr Wrist 2 Views Right  Result Date: 02/07/2019 As of the right wrist were obtained in 2 projections.  Fixation plate at the distal radius appeared to be in good position.  This was not a true AP as intraoperative films reveal excellent position of the plate and fracture.  There does not appear to be any change.  No fracture of the distal ulna.  Screws are well within the bone and no evidence of protrusion of the radiocarpal joint.    PMFS History: Patient Active Problem List   Diagnosis Date Noted  . Closed fracture of right wrist 01/29/2019  . Pain in right wrist 01/21/2019  . Pain in right hand 01/21/2019  . 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 S/P hip replacement 05/16/2018  . Pure hypercholesterolemia 12/24/2017  . Intertrochanteric fracture of right femur ([place]) 06/15/2015  . Comminuted right humeral fracture 06/15/2015  . HTN (hypertension) 06/15/2015   Past Medical History:  Diagnosis Date  . Anxiety   . Arthritis    oa  .  Attention deficit disorder   . Dysrhythmia    episode of palpitations x 25 January 2018, stress test normal  . H/O seasonal allergies   . Hyperlipidemia   . Hypertension   . Hypothyroidism    thyroid nodules, off synthroid > 15 years  . Insomnia   . Osteoarthritis   . Osteopenia   . Thyroid nodule     Family History  Problem Relation Age of Onset  . Heart failure Mother   . Hypertension Father   . Cancer Father   . Stroke Father   . Heart disease Brother   . Alcoholism Brother     Past Surgical History:  Procedure Laterality Date  . ABDOMINAL HYSTERECTOMY    . CONVERSION TO TOTAL HIP Left 05/16/2018   Procedure: LEFT HIP ANTERIOR APPROACH;  Surgeon: Paralee Cancel, MD;  Location: [facility];  Service: Orthopedics;  Laterality:  Left;  . FEMUR IM NAIL Right 06/16/2015   Procedure: INTRAMEDULLARY (IM) NAIL FEMORAL;  Surgeon: Renette Butters, MD;  Location: [facility];  Service: Orthopedics;  Laterality: Right;  . HIP SURGERY    . JOINT REPLACEMENT    . TUBAL LIGATION     Social History   Occupational History  . Not on file  Tobacco Use  . Smoking status: Former Smoker    Packs/day: 0.25    Types: Cigarettes    Last attempt to quit: 11/27/2005    Years since quitting: 13.2  . Smokeless tobacco: Never Used  Substance and Sexual Activity  . Alcohol use: Yes    Alcohol/week: 7.0 standard drinks    Types: 7 Shots of liquor per week    Comment: scotch daily  . Drug use: No  . Sexual activity: Not on file

## 2019-02-10 ENCOUNTER — Telehealth: Payer: Self-pay | Admitting: Internal Medicine

## 2019-02-10 MED ORDER — TRAMADOL HCL 50 MG PO TABS: 50.0000 mg | ORAL_TABLET | Freq: Three times a day (TID) | ORAL | 0 refills | Status: DC

## 2019-02-10 NOTE — Telephone Encounter (Addendum)
Jeanne Smith [phone]  [facility] [place]  Tramadol 15mg   [name] called to see if she could get a refill on tramadol, she has only one pill left to take tonight. She takes 2 a day. She is using this for pain instead of Oxycodone. She has an appointment tomorrow. She also stated that her BP is better on new medication.  Tramadol 50 mg #90 refilled

## 2019-02-11 ENCOUNTER — Encounter: Payer: Self-pay | Admitting: Internal Medicine

## 2019-02-11 ENCOUNTER — Ambulatory Visit (INDEPENDENT_AMBULATORY_CARE_PROVIDER_SITE_OTHER): Payer: Medicare Other | Admitting: Internal Medicine

## 2019-02-11 ENCOUNTER — Other Ambulatory Visit: Payer: Self-pay

## 2019-02-11 VITALS — BP 110/70 | Temp 98.5°F | Ht 65.0 in | Wt 135.0 lb

## 2019-02-11 DIAGNOSIS — I10 Essential (primary) hypertension: Secondary | ICD-10-CM | POA: Diagnosis not present

## 2019-02-11 DIAGNOSIS — G4709 Other insomnia: Secondary | ICD-10-CM | POA: Diagnosis not present

## 2019-02-11 DIAGNOSIS — E2839 Other primary ovarian failure: Secondary | ICD-10-CM | POA: Diagnosis not present

## 2019-02-11 DIAGNOSIS — S62101D Fracture of unspecified carpal bone, right wrist, subsequent encounter for fracture with routine healing: Secondary | ICD-10-CM | POA: Diagnosis not present

## 2019-02-11 DIAGNOSIS — Z23 Encounter for immunization: Secondary | ICD-10-CM | POA: Diagnosis not present

## 2019-02-13 ENCOUNTER — Other Ambulatory Visit: Payer: Self-pay

## 2019-02-13 MED ORDER — AMPHETAMINE-DEXTROAMPHET ER 15 MG PO CP24: ORAL_CAPSULE | ORAL | 0 refills | Status: DC

## 2019-02-13 NOTE — Telephone Encounter (Signed)
Patient called to request a refill on ADDERALL. Last refill on 01/07/19.

## 2019-02-25 NOTE — Patient Instructions (Addendum)
Continue current medications.  Have bone density study.  Follow-up in 4 months.  Pneumococcal vaccine needed

## 2019-02-25 NOTE — Progress Notes (Signed)
   Subjective:    Patient ID: Jeanne Smith, female    DOB: 1951/09/05, 68 y.o.   MRN: [id number]  HPI She is here today to follow-up on hypertension.  Her blood pressure is excellent.  Apparently she suffered a fall at home over a vacuum cleaner cord and suffered a broken right distal radius that had to be surgically repaired with open reduction and internal fixation by Dr. Durward Fortes in early March.  It has been a stressful time for her.  Her son and lives in [place] has had a relapse with alcoholism.  Her mother has been ill as well.  Her gentleman friend suffered a fall and was hospitalized.    Review of Systems see above     Objective:   Physical Exam Vital signs reviewed and are stable.  Neck is supple.  Chest clear.  Cardiac exam regular rate and rhythm.  Extremities without edema       Assessment & Plan:  Essential hypertension now under good control on current regimen  Attention deficit disorder treated with Adderall  Insomnia treated with Ambien  Situational stress  Recent wrist fracture- bone density study to look for osteoporosis  Health maintenance-needs pneumococcal vaccine  Plan: Return in 4 months

## 2019-02-28 ENCOUNTER — Ambulatory Visit (INDEPENDENT_AMBULATORY_CARE_PROVIDER_SITE_OTHER): Payer: Medicare Other | Admitting: Orthopaedic Surgery

## 2019-02-28 ENCOUNTER — Encounter: Payer: Self-pay | Admitting: Internal Medicine

## 2019-02-28 ENCOUNTER — Ambulatory Visit (INDEPENDENT_AMBULATORY_CARE_PROVIDER_SITE_OTHER): Payer: Medicare Other

## 2019-02-28 ENCOUNTER — Other Ambulatory Visit: Payer: Self-pay | Admitting: Internal Medicine

## 2019-02-28 ENCOUNTER — Encounter (INDEPENDENT_AMBULATORY_CARE_PROVIDER_SITE_OTHER): Payer: Self-pay | Admitting: Orthopaedic Surgery

## 2019-02-28 ENCOUNTER — Other Ambulatory Visit: Payer: Self-pay

## 2019-02-28 VITALS — BP 146/85 | HR 110 | Ht 65.0 in | Wt 135.0 lb

## 2019-02-28 DIAGNOSIS — M25531 Pain in right wrist: Secondary | ICD-10-CM | POA: Diagnosis not present

## 2019-02-28 DIAGNOSIS — F908 Attention-deficit hyperactivity disorder, other type: Secondary | ICD-10-CM

## 2019-02-28 DIAGNOSIS — G4709 Other insomnia: Secondary | ICD-10-CM

## 2019-02-28 MED ORDER — ZOLPIDEM TARTRATE 10 MG PO TABS: ORAL_TABLET | ORAL | 0 refills | Status: DC

## 2019-02-28 MED ORDER — ALPRAZOLAM 0.25 MG PO TABS: 0.2500 mg | ORAL_TABLET | Freq: Two times a day (BID) | ORAL | 0 refills | Status: DC | PRN

## 2019-02-28 NOTE — Telephone Encounter (Addendum)
Amma Crear [phone]  [facility] [place]  ALPRAZolam Duanne Moron) 0.25 MG tablet / zolpidem (AMBIEN) 10 MG tablet   [name] called to say she needs refills on above medications, she is completely out of Ambien.  Pt called late afternoon on Friday requesting 2 controlled substances be refilled. Sent in only 30 Ambien tablet and 30 Xanax tablets.

## 2019-02-28 NOTE — Progress Notes (Signed)
Office Visit Note   Patient: Jeanne Smith           Date of Birth: 10-Jul-1951           MRN: [id number] Visit Date: 02/28/2019              Requested by: Elby Showers, MD [address] [place], [place] [address] PCP: Elby Showers, MD   Assessment & Plan: Visit Diagnoses:  1. Pain in right wrist     Plan: 5 weeks status post ORIF of a displaced right distal radius fracture.  Actually doing well.  Still has a few aches and pains wears a volar wrist splint at night.  Encourage range of motion exercises and discontinue the splint.  Office 1 month  Follow-Up Instructions: Return in about 1 month (around 03/30/2019).   Orders:  Orders Placed This Encounter  Procedures  . XR Wrist Complete Right   No orders of the defined types were placed in this encounter.     Procedures: No procedures performed   Clinical Data: No additional findings.   Subjective: Chief Complaint  Patient presents with  . Right Wrist - Routine Post Op    Right wrist ORIF DOS 01/23/19  Patient presents today for follow up on her right wrist. She had right distal radius ORIF on 01/23/19. She is now 5 weeks out from surgery and wearing a wrist brace. She said that she is still having pain. She is taking Ibuprofen, but not suppose to due to high blood pressure. She also has complaints of right hip pain and right knee pain. Her right hip hurts in her groin and her right knee hurtd throughout. She said that she has a history of a right hip fracture in 2016.   No related numbness or tingling.  HPI  Review of Systems   Objective: Vital Signs: BP (!) 146/85   Pulse (!) 110   Ht 5\' 5"  (1.651 m)   Wt 135 lb (61.2 kg)   BMI 22.47 kg/m   Physical Exam Constitutional:      Appearance: She is well-developed.  Eyes:     Pupils: Pupils are equal, round, and reactive to light.  Pulmonary:     Effort: Pulmonary effort is normal.  Skin:    General: Skin is warm and dry.  Neurological:   Mental Status: She is alert and oriented to person, place, and time.  Psychiatric:        Behavior: Behavior normal.     Ortho Exam right wrist with minimal swelling compared to the left.  Full pronation and supination.  Full fist and release.  Motor and sensory exam intact.  Incision healing nicely.  Some of the subcuticular stitches are palpable beneath the incision but not painful.  Has about 30 degrees of dorsiflexion and volar flexion of the wrist. normal alignment  Specialty Comments:  No specialty comments available.  Imaging: No results found.   PMFS History: Patient Active Problem List   Diagnosis Date Noted  . Closed fracture of right wrist 01/29/2019  . Pain in right wrist 01/21/2019  . Pain in right hand 01/21/2019  . 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 S/P hip replacement 05/16/2018  . Pure hypercholesterolemia 12/24/2017  . Intertrochanteric fracture of right femur ([place]) 06/15/2015  . Comminuted right humeral fracture 06/15/2015  . HTN (hypertension) 06/15/2015   Past Medical History:  Diagnosis Date  . Anxiety   . Arthritis    oa  . Attention  deficit disorder   . Dysrhythmia    episode of palpitations x 25 January 2018, stress test normal  . H/O seasonal allergies   . Hyperlipidemia   . Hypertension   . Hypothyroidism    thyroid nodules, off synthroid > 15 years  . Insomnia   . Osteoarthritis   . Osteopenia   . Thyroid nodule     Family History  Problem Relation Age of Onset  . Heart failure Mother   . Hypertension Father   . Cancer Father   . Stroke Father   . Heart disease Brother   . Alcoholism Brother     Past Surgical History:  Procedure Laterality Date  . ABDOMINAL HYSTERECTOMY    . CONVERSION TO TOTAL HIP Left 05/16/2018   Procedure: LEFT HIP ANTERIOR APPROACH;  Surgeon: Paralee Cancel, MD;  [facility];  Service: Orthopedics;  Laterality: Left;  . FEMUR IM NAIL Right 06/16/2015   Procedure: INTRAMEDULLARY (IM) NAIL FEMORAL;  Surgeon:  Renette Butters, MD;  Location: [facility];  Service: Orthopedics;  Laterality: Right;  . HIP SURGERY    . JOINT REPLACEMENT    . TUBAL LIGATION     Social History   Occupational History  . Not on file  Tobacco Use  . Smoking status: Former Smoker    Packs/day: 0.25    Types: Cigarettes    Last attempt to quit: 11/27/2005    Years since quitting: 13.2  . Smokeless tobacco: Never Used  Substance and Sexual Activity  . Alcohol use: Yes    Alcohol/week: 7.0 standard drinks    Types: 7 Shots of liquor per week    Comment: scotch daily  . Drug use: No  . Sexual activity: Not on file

## 2019-03-03 ENCOUNTER — Other Ambulatory Visit: Payer: Self-pay

## 2019-03-03 MED ORDER — AMLODIPINE BESYLATE 5 MG PO TABS: 5.0000 mg | ORAL_TABLET | Freq: Every day | ORAL | 0 refills | Status: DC

## 2019-03-03 MED ORDER — HYDROCHLOROTHIAZIDE 25 MG PO TABS: 25.0000 mg | ORAL_TABLET | Freq: Every day | ORAL | 0 refills | Status: DC

## 2019-03-04 ENCOUNTER — Other Ambulatory Visit: Payer: Self-pay | Admitting: Internal Medicine

## 2019-03-04 DIAGNOSIS — T8543XA Leakage of breast prosthesis and implant, initial encounter: Secondary | ICD-10-CM

## 2019-03-04 DIAGNOSIS — N6451 Induration of breast: Secondary | ICD-10-CM

## 2019-03-13 ENCOUNTER — Other Ambulatory Visit: Payer: Self-pay

## 2019-03-13 NOTE — Telephone Encounter (Signed)
Please call pt. This will not be due until Sunday. It is too early. She can call us back Monday to request refill. No exceptions.

## 2019-03-13 NOTE — Telephone Encounter (Signed)
Patient is requesting a refill on ADDERALL.

## 2019-03-18 ENCOUNTER — Other Ambulatory Visit: Payer: Self-pay

## 2019-03-18 NOTE — Telephone Encounter (Signed)
Patient is calling for a refill, last filled on 02/14/19.

## 2019-03-19 MED ORDER — AMPHETAMINE-DEXTROAMPHET ER 15 MG PO CP24: ORAL_CAPSULE | ORAL | 0 refills | Status: DC

## 2019-03-28 ENCOUNTER — Ambulatory Visit
Admission: RE | Admit: 2019-03-28 | Discharge: 2019-03-28 | Disposition: A | Discharge status: Home / Self Care | Payer: Medicare Other | Source: Ambulatory Visit | Attending: Internal Medicine | Admitting: Internal Medicine

## 2019-03-28 ENCOUNTER — Other Ambulatory Visit: Payer: Self-pay

## 2019-03-28 DIAGNOSIS — T8543XA Leakage of breast prosthesis and implant, initial encounter: Secondary | ICD-10-CM

## 2019-03-28 DIAGNOSIS — N6451 Induration of breast: Secondary | ICD-10-CM

## 2019-03-31 ENCOUNTER — Ambulatory Visit (INDEPENDENT_AMBULATORY_CARE_PROVIDER_SITE_OTHER): Payer: Medicare Other | Admitting: Orthopaedic Surgery

## 2019-04-01 ENCOUNTER — Other Ambulatory Visit: Payer: Self-pay

## 2019-04-01 ENCOUNTER — Ambulatory Visit (INDEPENDENT_AMBULATORY_CARE_PROVIDER_SITE_OTHER): Payer: Medicare Other | Admitting: Orthopaedic Surgery

## 2019-04-01 ENCOUNTER — Other Ambulatory Visit: Payer: Self-pay | Admitting: *Deleted

## 2019-04-01 ENCOUNTER — Encounter: Payer: Self-pay | Admitting: Orthopaedic Surgery

## 2019-04-01 VITALS — BP 126/70 | HR 99 | Ht 65.0 in | Wt 135.0 lb

## 2019-04-01 DIAGNOSIS — M65839 Other synovitis and tenosynovitis, unspecified forearm: Secondary | ICD-10-CM | POA: Diagnosis not present

## 2019-04-01 DIAGNOSIS — M25551 Pain in right hip: Secondary | ICD-10-CM

## 2019-04-01 MED ORDER — LIDOCAINE HCL 1 % IJ SOLN
1.0000 mL | INTRAMUSCULAR | Status: AC | PRN
  Administered 2019-04-01: 1 mL

## 2019-04-01 MED ORDER — METHYLPREDNISOLONE ACETATE 40 MG/ML IJ SUSP
20.0000 mg | INTRAMUSCULAR | Status: AC | PRN
  Administered 2019-04-01: 12:00:00 20 mg

## 2019-04-01 NOTE — Progress Notes (Signed)
Office Visit Note   Patient: Jeanne Smith           Date of Birth: [birth year]           MRN: [id number] Visit Date: 04/01/2019              Requested by: Elby Showers, MD [address] [place], [place] [address] PCP: Elby Showers, MD   Assessment & Plan: Visit Diagnoses:  1. Stenosing tenosynovitis of wrist     Plan: 9 weeks status post open reduction internal fixation of a displaced right distal radius fracture.  I think the fracture is fine.  There is evidence of de Quervain's.  I have injected this with Xylocaine and Depo-Medrol with immediate relief of her pain.  Splint at home that she will wear and I want to check her again in 2 or 3 weeks. We will asked Dr. Ernestina Patches to consider cortisone injection of her right arthritic hip  Follow-Up Instructions: Return in about 2 weeks (around 04/15/2019).   Orders:  Orders Placed This Encounter  Procedures  . Hand/UE Inj: R extensor compartment 1   No orders of the defined types were placed in this encounter.     Procedures: Hand/UE Inj: R extensor compartment 1 for de Quervain's tenosynovitis on 04/01/2019 11:41 AM Medications: 1 mL lidocaine 1 %; 20 mg methylPREDNISolone acetate 40 MG/ML      Clinical Data: No additional findings.   Subjective: Chief Complaint  Patient presents with  . Right Wrist - Follow-up    Right wrist ORIF DOS 01/23/19  Patient presents today for follow up on her right wrist. She had ORIF of her right wrist on 01/23/19. She is now 70months out from surgery. She is still having continued pain. Her pain is located medially and laterally. She has been wearing an OTC wrist brace. She is taking Ibuprofen for pain.  Pain seems to be localized in the area of the first dorsal extensor compartment.  No numbness or tingling.  No swelling HPI  Review of Systems   Objective: Vital Signs: BP 126/70   Pulse 99   Ht 5\' 5"  (1.651 m)   Wt 135 lb (61.2 kg)   BMI 22.47 kg/m   Physical Exam  Ortho  Exam right wrist with no swelling.  Has just about full pronation supination flexion extension with no dorsal or volar wrist pain does have a positive Finkelstein's test with tenderness over the first dorsal extensor compartment.  No particular tenderness dorsally or volarly of the radius  Specialty Comments:  No specialty comments available.  Imaging: No results found.   PMFS History: Patient Active Problem List   Diagnosis Date Noted  . Stenosing tenosynovitis of wrist 04/01/2019  . Closed fracture of right wrist 01/29/2019  . Pain in right wrist 01/21/2019  . Pain in right hand 01/21/2019  . 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 S/P hip replacement 05/16/2018  . Pure hypercholesterolemia 12/24/2017  . Intertrochanteric fracture of right femur ([facility]) 06/15/2015  . Comminuted right humeral fracture 06/15/2015  . HTN (hypertension) 06/15/2015   Past Medical History:  Diagnosis Date  . Anxiety   . Arthritis    oa  . Attention deficit disorder   . Dysrhythmia    episode of palpitations x 25 January 2018, stress test normal  . H/O seasonal allergies   . Hyperlipidemia   . Hypertension   . Hypothyroidism    thyroid nodules, off synthroid > 15 years  . Insomnia   .  Osteoarthritis   . Osteopenia   . Thyroid nodule     Family History  Problem Relation Age of Onset  . Heart failure Mother   . Hypertension Father   . Cancer Father   . Stroke Father   . Heart disease Brother   . Alcoholism Brother     Past Surgical History:  Procedure Laterality Date  . ABDOMINAL HYSTERECTOMY    . AUGMENTATION MAMMAPLASTY Bilateral    silicone over 30 years  . CONVERSION TO TOTAL HIP Left 05/16/2018   Procedure: LEFT HIP ANTERIOR APPROACH;  Surgeon: Paralee Cancel, MD;  Location[facility];  Service: Orthopedics;  Laterality: Left;  . FEMUR IM NAIL Right 06/16/2015   Procedure: INTRAMEDULLARY (IM) NAIL FEMORAL;  Surgeon: Renette Butters, MD;  Location: [facility];  Service: Orthopedics;  Laterality: Right;   . HIP SURGERY    . JOINT REPLACEMENT    . TUBAL LIGATION     Social History   Occupational History  . Not on file  Tobacco Use  . Smoking status: Former Smoker    Packs/day: 0.25    Types: Cigarettes    Last attempt to quit: 11/27/2005    Years since quitting: 13.3  . Smokeless tobacco: Never Used  Substance and Sexual Activity  . Alcohol use: Yes    Alcohol/week: 7.0 standard drinks    Types: 7 Shots of liquor per week    Comment: scotch daily  . Drug use: No  . Sexual activity: Not on file

## 2019-04-04 ENCOUNTER — Other Ambulatory Visit: Payer: Self-pay | Admitting: Internal Medicine

## 2019-04-04 MED ORDER — TRAMADOL HCL 50 MG PO TABS: 50.0000 mg | ORAL_TABLET | Freq: Three times a day (TID) | ORAL | 0 refills | Status: DC

## 2019-04-04 NOTE — Telephone Encounter (Signed)
Refilled once says she still has wrist pain.

## 2019-04-04 NOTE — Telephone Encounter (Signed)
Received Fax RX request from  [facility]  Medication - traMADol (ULTRAM) 50 MG tablet    Last Refill - 3.16.20  Last OV - 02/11/19  Last CPE - 10/04/18

## 2019-04-09 DIAGNOSIS — Z6823 Body mass index (BMI) 23.0-23.9, adult: Secondary | ICD-10-CM | POA: Diagnosis not present

## 2019-04-09 DIAGNOSIS — Z124 Encounter for screening for malignant neoplasm of cervix: Secondary | ICD-10-CM | POA: Diagnosis not present

## 2019-04-10 ENCOUNTER — Ambulatory Visit: Payer: Medicare Other | Admitting: Orthopaedic Surgery

## 2019-04-10 ENCOUNTER — Other Ambulatory Visit: Payer: Self-pay

## 2019-04-10 MED ORDER — ROSUVASTATIN CALCIUM 10 MG PO TABS: 10.0000 mg | ORAL_TABLET | Freq: Every day | ORAL | 1 refills | Status: DC

## 2019-04-17 ENCOUNTER — Other Ambulatory Visit: Payer: Self-pay

## 2019-04-17 MED ORDER — NEBIVOLOL HCL 20 MG PO TABS: 20.0000 mg | ORAL_TABLET | Freq: Every day | ORAL | 3 refills | Status: DC

## 2019-04-18 ENCOUNTER — Telehealth: Payer: Self-pay | Admitting: Internal Medicine

## 2019-04-18 MED ORDER — ZOLPIDEM TARTRATE 10 MG PO TABS: ORAL_TABLET | ORAL | 0 refills | Status: DC

## 2019-04-18 MED ORDER — ALPRAZOLAM 0.25 MG PO TABS: 0.2500 mg | ORAL_TABLET | Freq: Two times a day (BID) | ORAL | 0 refills | Status: DC | PRN

## 2019-04-18 NOTE — Telephone Encounter (Signed)
Refill Xanax and Ambien #30 tabs each with NO additional refills on May 22,2020

## 2019-04-22 ENCOUNTER — Other Ambulatory Visit: Payer: Self-pay

## 2019-04-22 ENCOUNTER — Encounter: Payer: Self-pay | Admitting: Orthopaedic Surgery

## 2019-04-22 ENCOUNTER — Ambulatory Visit (INDEPENDENT_AMBULATORY_CARE_PROVIDER_SITE_OTHER): Payer: Medicare Other | Admitting: Orthopaedic Surgery

## 2019-04-22 ENCOUNTER — Other Ambulatory Visit: Payer: Self-pay | Admitting: Internal Medicine

## 2019-04-22 VITALS — BP 136/91 | HR 99 | Ht 65.0 in | Wt 135.0 lb

## 2019-04-22 DIAGNOSIS — M79641 Pain in right hand: Secondary | ICD-10-CM

## 2019-04-22 MED ORDER — AMPHETAMINE-DEXTROAMPHET ER 15 MG PO CP24: ORAL_CAPSULE | ORAL | 0 refills | Status: DC

## 2019-04-22 NOTE — Progress Notes (Signed)
Office Visit Note   Patient: Jeanne Smith           Date of Birth: [birth year]           MRN: [id number] Visit Date: 04/22/2019              Requested by: Elby Showers, MD [address] [place], [place] [address] PCP: Elby Showers, MD   Assessment & Plan: Visit Diagnoses:  1. Pain of right hand     Plan: Status post ORIF of right distal radius fracture and from that standpoint doing very well.  Has no loss of motion and minimal discomfort.  Last seen about a month ago with symptoms consistent with de Quervain's.  Cortisone injection alleviated her pain.  Quite well.  Have encouraged her to remove the splint as often as possible and continue with strengthening exercises  Follow-Up Instructions: Return if symptoms worsen or fail to improve.   Orders:  No orders of the defined types were placed in this encounter.  No orders of the defined types were placed in this encounter.     Procedures: No procedures performed   Clinical Data: No additional findings.   Subjective: Chief Complaint  Patient presents with  . Right Wrist - Follow-up    Right wrist ORIF 01/23/19  Patient presents today for follow up on her right wrist. She had ORIF of her right wrist on 01/23/19. She is now 3 months out from surgery. Three weeks ago Dr.Albert Devaul injected her hand for DeQuervain's. She said that her wrist is still bothering her, but it has felt better with the injection. She does wear a brace when she is more active. She is taking tramadol as needed for her hip, which she getting injected on Friday.   HPI  Review of Systems   Objective: Vital Signs: BP (!) 136/91   Pulse 99   Ht 5\' 5"  (1.651 m)   Wt 135 lb (61.2 kg)   BMI 22.47 kg/m   Physical Exam  Ortho Exam right wrist with full pronation and supination without a discomfort.  Has almost full dorsiflexion and volar flexion of her wrist.  Neurovascular exam intact.  Negative Finkelstein's test.  No localized areas of  tenderness about the radiocarpal joint.  Has 1 small subcuticular stitch that has not dissolved that is prominent beneath the skin and but not red or draining.  Does have some degenerative changes across the DIP and PIP joints Specialty Comments:  No specialty comments available.  Imaging: No results found.   PMFS History: Patient Active Problem List   Diagnosis Date Noted  . Stenosing tenosynovitis of wrist 04/01/2019  . Closed fracture of right wrist 01/29/2019  . Pain in right wrist 01/21/2019  . Pain in right hand 01/21/2019  . 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 S/P hip replacement 05/16/2018  . Pure hypercholesterolemia 12/24/2017  . Intertrochanteric fracture of right femur ([facility]) 06/15/2015  . Comminuted right humeral fracture 06/15/2015  . HTN (hypertension) 06/15/2015   Past Medical History:  Diagnosis Date  . Anxiety   . Arthritis    oa  . Attention deficit disorder   . Dysrhythmia    episode of palpitations x 25 January 2018, stress test normal  . H/O seasonal allergies   . Hyperlipidemia   . Hypertension   . Hypothyroidism    thyroid nodules, off synthroid > 15 years  . Insomnia   . Osteoarthritis   . Osteopenia   . Thyroid nodule  Family History  Problem Relation Age of Onset  . Heart failure Mother   . Hypertension Father   . Cancer Father   . Stroke Father   . Heart disease Brother   . Alcoholism Brother     Past Surgical History:  Procedure Laterality Date  . ABDOMINAL HYSTERECTOMY    . AUGMENTATION MAMMAPLASTY Bilateral    silicone over 30 years  . CONVERSION TO TOTAL HIP Left 05/16/2018   Procedure: LEFT HIP ANTERIOR APPROACH;  Surgeon: Paralee Cancel, MD;  [facility];  Service: Orthopedics;  Laterality: Left;  . FEMUR IM NAIL Right 06/16/2015   Procedure: INTRAMEDULLARY (IM) NAIL FEMORAL;  Surgeon: Renette Butters, MD;  Location: [facility];  Service: Orthopedics;  Laterality: Right;  . HIP SURGERY    . JOINT REPLACEMENT    . TUBAL LIGATION      Social History   Occupational History  . Not on file  Tobacco Use  . Smoking status: Former Smoker    Packs/day: 0.25    Types: Cigarettes    Last attempt to quit: 11/27/2005    Years since quitting: 13.4  . Smokeless tobacco: Never Used  Substance and Sexual Activity  . Alcohol use: Yes    Alcohol/week: 7.0 standard drinks    Types: 7 Shots of liquor per week    Comment: scotch daily  . Drug use: No  . Sexual activity: Not on file

## 2019-04-22 NOTE — Telephone Encounter (Signed)
Jeanne Smith [phone]  [facility] [place]  amphetamine-dextroamphetamine (ADDERALL XR) 15 MG 24 hr capsule   Kymberly called to say she needs refill on above listed medication sent in.

## 2019-04-24 ENCOUNTER — Ambulatory Visit
Admission: RE | Admit: 2019-04-24 | Discharge: 2019-04-24 | Disposition: A | Discharge status: Home / Self Care | Payer: Medicare Other | Source: Ambulatory Visit | Attending: Orthopaedic Surgery | Admitting: Orthopaedic Surgery

## 2019-04-24 ENCOUNTER — Other Ambulatory Visit: Payer: Self-pay

## 2019-04-24 ENCOUNTER — Ambulatory Visit: Payer: Medicare Other | Admitting: Orthopaedic Surgery

## 2019-04-24 DIAGNOSIS — M25551 Pain in right hip: Secondary | ICD-10-CM

## 2019-04-24 MED ORDER — IOPAMIDOL (ISOVUE-M 200) INJECTION 41%
1.0000 mL | Freq: Once | INTRAMUSCULAR | Status: AC
  Administered 2019-04-24: 10:00:00 1 mL via INTRA_ARTICULAR

## 2019-04-24 MED ORDER — METHYLPREDNISOLONE ACETATE 40 MG/ML INJ SUSP (RADIOLOG
120.0000 mg | Freq: Once | INTRAMUSCULAR | Status: AC
  Administered 2019-04-24: 120 mg via INTRA_ARTICULAR

## 2019-05-09 ENCOUNTER — Telehealth: Payer: Self-pay | Admitting: Internal Medicine

## 2019-05-09 MED ORDER — OLMESARTAN MEDOXOMIL 40 MG PO TABS: ORAL_TABLET | ORAL | 0 refills | Status: DC

## 2019-05-09 NOTE — Telephone Encounter (Signed)
Received Fax RX request from  Pharmacy - [facility]  Medication - olmesartan (BENICAR) 40 MG tablet   Last Refill -  3.11.20  Last OV - 02-11-19  Last CPE - 10-04-18

## 2019-05-20 DIAGNOSIS — L649 Androgenic alopecia, unspecified: Secondary | ICD-10-CM | POA: Diagnosis not present

## 2019-05-20 DIAGNOSIS — L650 Telogen effluvium: Secondary | ICD-10-CM | POA: Diagnosis not present

## 2019-05-20 DIAGNOSIS — I788 Other diseases of capillaries: Secondary | ICD-10-CM | POA: Diagnosis not present

## 2019-05-22 ENCOUNTER — Other Ambulatory Visit: Payer: Self-pay | Admitting: Internal Medicine

## 2019-05-22 MED ORDER — AMPHETAMINE-DEXTROAMPHET ER 15 MG PO CP24: ORAL_CAPSULE | ORAL | 0 refills | Status: DC

## 2019-05-22 NOTE — Telephone Encounter (Signed)
Jeanne Smith [phone]  [facility] -- [place]  amphetamine-dextroamphetamine (ADDERALL XR) 15 MG 24 hr capsule   zolpidem (AMBIEN) 10 MG tablet   [name] called to say she is completely out of the above medications

## 2019-05-24 ENCOUNTER — Telehealth: Payer: Self-pay | Admitting: Internal Medicine

## 2019-05-24 ENCOUNTER — Encounter: Payer: Self-pay | Admitting: Internal Medicine

## 2019-05-24 DIAGNOSIS — G4700 Insomnia, unspecified: Secondary | ICD-10-CM

## 2019-05-24 MED ORDER — ZOLPIDEM TARTRATE 10 MG PO TABS: 10.0000 mg | ORAL_TABLET | Freq: Every evening | ORAL | 0 refills | Status: DC | PRN

## 2019-05-24 NOTE — Telephone Encounter (Signed)
Received fax pharmacy reqyest at 8:10 pm requesting refill on Ambien 10 mg tablets. Last refill was May 22 for 30 tabs. E-scribed #30 tabs with no refill tonight. Was told expected pick up time was this evening.

## 2019-06-09 ENCOUNTER — Other Ambulatory Visit: Payer: Self-pay | Admitting: Internal Medicine

## 2019-06-09 MED ORDER — HYDROCHLOROTHIAZIDE 25 MG PO TABS: 25.0000 mg | ORAL_TABLET | Freq: Every day | ORAL | 0 refills | Status: DC

## 2019-06-09 MED ORDER — AMLODIPINE BESYLATE 5 MG PO TABS: 5.0000 mg | ORAL_TABLET | Freq: Every day | ORAL | 0 refills | Status: DC

## 2019-06-09 NOTE — Telephone Encounter (Signed)
Received Fax RX request from  [facility]  Medication - amLODipine (NORVASC) 5 MG tablet / hydrochlorothiazide (HYDRODIURIL) 25 MG tablet  Last Refill - 4.6.20  Last OV - 02-11-19  Last CPE 10-04-18

## 2019-06-19 ENCOUNTER — Ambulatory Visit: Payer: Medicare Other | Admitting: Internal Medicine

## 2019-06-19 ENCOUNTER — Encounter: Payer: Self-pay | Admitting: Internal Medicine

## 2019-06-19 ENCOUNTER — Ambulatory Visit (INDEPENDENT_AMBULATORY_CARE_PROVIDER_SITE_OTHER): Payer: Medicare Other | Admitting: Internal Medicine

## 2019-06-19 VITALS — BP 100/60 | HR 101 | Ht 65.0 in | Wt 135.0 lb

## 2019-06-19 DIAGNOSIS — L659 Nonscarring hair loss, unspecified: Secondary | ICD-10-CM | POA: Diagnosis not present

## 2019-06-19 DIAGNOSIS — I10 Essential (primary) hypertension: Secondary | ICD-10-CM

## 2019-06-19 DIAGNOSIS — E785 Hyperlipidemia, unspecified: Secondary | ICD-10-CM

## 2019-06-19 DIAGNOSIS — F411 Generalized anxiety disorder: Secondary | ICD-10-CM | POA: Diagnosis not present

## 2019-06-19 DIAGNOSIS — E2839 Other primary ovarian failure: Secondary | ICD-10-CM | POA: Diagnosis not present

## 2019-06-19 DIAGNOSIS — G4709 Other insomnia: Secondary | ICD-10-CM | POA: Diagnosis not present

## 2019-06-19 DIAGNOSIS — F908 Attention-deficit hyperactivity disorder, other type: Secondary | ICD-10-CM | POA: Diagnosis not present

## 2019-06-19 MED ORDER — ALPRAZOLAM 0.25 MG PO TABS: 0.2500 mg | ORAL_TABLET | Freq: Two times a day (BID) | ORAL | 0 refills | Status: DC | PRN

## 2019-06-19 MED ORDER — ROSUVASTATIN CALCIUM 10 MG PO TABS: 10.0000 mg | ORAL_TABLET | Freq: Every day | ORAL | 1 refills | Status: DC

## 2019-06-19 NOTE — Progress Notes (Signed)
   Subjective:    Patient ID: Jeanne Smith, female    DOB: [birth year], 68 y.o.   MRN: [id number]  HPI  Dermatologist stopped Bystolic due to hair loss.  She remains on Adderall for attention deficit without hyperactivity.  History of insomnia.  History of fractured wrist and needs to have bone density study in the near future.  Continues to have situational stress with family.  Son has history of alcohol abuse and is drinking again.    Review of Systems likely has osteopenia or osteoporosis-needs bone density study    Objective:   Physical Exam Blood pressure is excellent today at 559/74 off diastolic.  Pulse 101.  Weight 135 pounds.  Skin warm and dry.  Nodes none.  Chest clear.  Cardiac exam regular rate and rhythm.  Extremities without edema.  Affect is normal.       Assessment & Plan:  Essential hypertension-stable off Bystolic.  Is on olmesartan and Norvasc and HCTZ  Insomnia-treated with Ambien  Hyperlipidemia-take Crestor  History of wrist fracture-to have bone density study in the near future.  Be sure to take vitamin D supplement.  Bone density study ordered.  Was not done in GYN office she says.  Attention deficit-takes Adderall XR 15 mg 2 p.o. every morning  Anxiety and situational stress.  Small dose of Xanax 0.25 mg twice daily as needed.  Must take sparingly.  Hair loss now on Proscar 5 mg daily per dermatologist  Estrogen replacement per GYN physician for Estrace 2 mg daily  Plan: Needs follow-up with health maintenance exam and fasting lab work in 6 months.  Needs to have bone density study and this Fall needs annual flu vaccine.  25 minutes spent reviewing records, meds, medical issues, speaking with and seeing patient today.  Medical decision making included at this time as well.

## 2019-06-23 ENCOUNTER — Other Ambulatory Visit: Payer: Self-pay | Admitting: Internal Medicine

## 2019-06-23 MED ORDER — ZOLPIDEM TARTRATE 10 MG PO TABS: 10.0000 mg | ORAL_TABLET | Freq: Every evening | ORAL | 0 refills | Status: DC | PRN

## 2019-06-23 MED ORDER — AMPHETAMINE-DEXTROAMPHET ER 15 MG PO CP24: ORAL_CAPSULE | ORAL | 0 refills | Status: DC

## 2019-06-23 NOTE — Telephone Encounter (Signed)
Jeanne Smith [phone]  [name]  Adderall / Cruzita Lederer called to say she needs a refill on both of the above listed medication.

## 2019-06-27 NOTE — Patient Instructions (Signed)
Take Xanax sparingly.  Continue other medications as previously prescribed.  Follow-up in 6 months.  Have bone density study.

## 2019-07-23 ENCOUNTER — Other Ambulatory Visit: Payer: Self-pay | Admitting: Internal Medicine

## 2019-07-23 NOTE — Telephone Encounter (Signed)
Jeanne Smith [phone]  [facility]   amphetamine-dextroamphetamine (ADDERALL XR) 15 MG 24 hr capsule  [facility] called to say she needs refill on above medication.

## 2019-07-24 MED ORDER — AMPHETAMINE-DEXTROAMPHET ER 15 MG PO CP24: ORAL_CAPSULE | ORAL | 0 refills | Status: DC

## 2019-07-25 ENCOUNTER — Telehealth: Payer: Self-pay | Admitting: Internal Medicine

## 2019-07-25 ENCOUNTER — Encounter: Payer: Self-pay | Admitting: Internal Medicine

## 2019-07-25 DIAGNOSIS — G4709 Other insomnia: Secondary | ICD-10-CM

## 2019-07-25 MED ORDER — ZOLPIDEM TARTRATE 10 MG PO TABS: 10.0000 mg | ORAL_TABLET | Freq: Every evening | ORAL | 0 refills | Status: DC | PRN

## 2019-07-25 NOTE — Telephone Encounter (Signed)
Refill Ambien 10 mg #30 with  0 refills

## 2019-08-01 ENCOUNTER — Telehealth: Payer: Self-pay | Admitting: Internal Medicine

## 2019-08-01 ENCOUNTER — Encounter: Payer: Self-pay | Admitting: Internal Medicine

## 2019-08-01 MED ORDER — TRAMADOL HCL 50 MG PO TABS: 50.0000 mg | ORAL_TABLET | Freq: Three times a day (TID) | ORAL | 0 refills | Status: DC

## 2019-08-01 MED ORDER — ALPRAZOLAM 0.25 MG PO TABS: 0.2500 mg | ORAL_TABLET | Freq: Two times a day (BID) | ORAL | 0 refills | Status: DC | PRN

## 2019-08-01 NOTE — Telephone Encounter (Signed)
Refill Tramadol and Xanax 0,25 mg as previously filled

## 2019-08-11 ENCOUNTER — Telehealth: Payer: Self-pay | Admitting: Internal Medicine

## 2019-08-11 MED ORDER — OLMESARTAN MEDOXOMIL 40 MG PO TABS: ORAL_TABLET | ORAL | 1 refills | Status: DC

## 2019-08-11 NOTE — Telephone Encounter (Signed)
Received Fax RX request from  [name]  Medication - olmesartan (BENICAR) 40 MG tablet    Last Refill - 05/09/19  Last OV - 06/19/19  Last CPE - 10/04/18

## 2019-08-11 NOTE — Telephone Encounter (Signed)
Medication was already done 08/01/19

## 2019-08-11 NOTE — Addendum Note (Signed)
Addended by: Mady Haagensen on: 08/11/2019 01:08 PM   Modules accepted: Orders

## 2019-08-11 NOTE — Telephone Encounter (Signed)
Scheduled CPE 

## 2019-08-11 NOTE — Telephone Encounter (Signed)
Left message to call back to book CPE 

## 2019-08-25 ENCOUNTER — Other Ambulatory Visit: Payer: Self-pay

## 2019-08-25 MED ORDER — AMPHETAMINE-DEXTROAMPHET ER 15 MG PO CP24: ORAL_CAPSULE | ORAL | 0 refills | Status: DC

## 2019-08-25 NOTE — Telephone Encounter (Signed)
Patient called to request a refill on ADDERALL, last refill 07/24/2019. Next CPE scheduled in December.

## 2019-09-04 ENCOUNTER — Other Ambulatory Visit: Payer: Self-pay | Admitting: Internal Medicine

## 2019-09-04 MED ORDER — ZOLPIDEM TARTRATE 10 MG PO TABS: 10.0000 mg | ORAL_TABLET | Freq: Every evening | ORAL | 2 refills | Status: DC | PRN

## 2019-09-04 NOTE — Telephone Encounter (Signed)
Received Fax RX request from  [facility]  Medication - zolpidem (AMBIEN) 10 MG tablet    Last Refill - 07/25/19  Last OV - 02/11/19  Last CPE - 10/04/18  Next CPE scheduled 11/03/19

## 2019-09-08 ENCOUNTER — Telehealth: Payer: Self-pay | Admitting: Internal Medicine

## 2019-09-08 MED ORDER — AMLODIPINE BESYLATE 5 MG PO TABS: 5.0000 mg | ORAL_TABLET | Freq: Every day | ORAL | 0 refills | Status: DC

## 2019-09-08 NOTE — Telephone Encounter (Signed)
Received Ocean[facility] request from  [facility]  Medication - amLODipine (NORVASC) 5 MG tablet / hydrochlorothiazide (HYDRODIURIL) 25 MG tablet   Last Refill - 06/09/19  Last OV - 06/26/19  Last CPE - 10/05/19  Next CPE scheduled for 11/03/19

## 2019-09-09 ENCOUNTER — Telehealth: Payer: Self-pay | Admitting: Internal Medicine

## 2019-09-09 MED ORDER — HYDROCHLOROTHIAZIDE 25 MG PO TABS: 25.0000 mg | ORAL_TABLET | Freq: Every day | ORAL | 0 refills | Status: DC

## 2019-09-09 NOTE — Telephone Encounter (Signed)
Received Fax RX request from  Pharmacy - [facility]  Medication - hydrochlorothiazide (HYDRODIURIL) 25 MG tablet   Last Refill - 06/09/19  Last OV - 06/19/19  Last CPE - 10/04/18  Next CPE scheduled for 11/03/19

## 2019-09-12 ENCOUNTER — Other Ambulatory Visit: Payer: Self-pay | Admitting: Internal Medicine

## 2019-09-12 MED ORDER — ALPRAZOLAM 0.25 MG PO TABS: 0.2500 mg | ORAL_TABLET | Freq: Two times a day (BID) | ORAL | 0 refills | Status: DC | PRN

## 2019-09-12 NOTE — Telephone Encounter (Signed)
Received Fax RX request from  [facility]  Medication - ALPRAZolam (XANAX) 0.25 MG tablet   Last Refill - 08/12/19  Last OV - 06/26/19  Last CPE 10/04/18  Next CPE schedule 11/03/19

## 2019-09-15 ENCOUNTER — Telehealth: Payer: Self-pay | Admitting: Physical Medicine and Rehabilitation

## 2019-09-15 NOTE — Telephone Encounter (Signed)
yes

## 2019-09-15 NOTE — Telephone Encounter (Signed)
Scheduled for 1-15

## 2019-09-22 ENCOUNTER — Other Ambulatory Visit: Payer: Self-pay | Admitting: Internal Medicine

## 2019-09-22 DIAGNOSIS — G8929 Other chronic pain: Secondary | ICD-10-CM

## 2019-09-22 DIAGNOSIS — M1611 Unilateral primary osteoarthritis, right hip: Secondary | ICD-10-CM

## 2019-09-22 MED ORDER — TRAMADOL HCL 50 MG PO TABS: 50.0000 mg | ORAL_TABLET | Freq: Two times a day (BID) | ORAL | 0 refills | Status: DC

## 2019-09-22 MED ORDER — TRAMADOL HCL 50 MG PO TABS: 50.0000 mg | ORAL_TABLET | Freq: Three times a day (TID) | ORAL | 0 refills | Status: DC

## 2019-09-22 NOTE — Telephone Encounter (Signed)
Patient called back states she takes this med twice a day for her hip and she has to take it everyday bc she can't bear the pain. She is asking for tramadol 100mg  to take it twice a day.

## 2019-09-22 NOTE — Telephone Encounter (Signed)
Received Fax RX request from  [facility]  Medication - traMADol (ULTRAM) 50 MG tablet    Last Refill - 08/12/19  Last OV - 02/11/19   Last CPE -10/04/18  Next CPE scheduled 11/03/19

## 2019-09-22 NOTE — Telephone Encounter (Signed)
Called into pharmacy

## 2019-09-22 NOTE — Telephone Encounter (Signed)
She takes Ambien, Xanax, Tramadol, Adderall. I am not comfortable with Tramadol 100 mg at a time. Can only refill 50 mg twice daily. Needs to see orthopedist if having a lot of pain.

## 2019-09-22 NOTE — Telephone Encounter (Signed)
Called and let patient know what Dr Verlene Mayer said, she verbalized understanding and stated she has appointment with Ortho on 09/29/19

## 2019-10-02 ENCOUNTER — Ambulatory Visit (INDEPENDENT_AMBULATORY_CARE_PROVIDER_SITE_OTHER): Payer: Medicare Other | Admitting: Physical Medicine and Rehabilitation

## 2019-10-02 ENCOUNTER — Other Ambulatory Visit: Payer: Self-pay

## 2019-10-02 ENCOUNTER — Ambulatory Visit: Payer: Self-pay

## 2019-10-02 ENCOUNTER — Encounter: Payer: Self-pay | Admitting: Physical Medicine and Rehabilitation

## 2019-10-02 DIAGNOSIS — M25551 Pain in right hip: Secondary | ICD-10-CM | POA: Diagnosis not present

## 2019-10-02 NOTE — Progress Notes (Signed)
 .  Numeric Pain Rating Scale and Functional Assessment Average Pain 8   In the last MONTH (on 0-10 scale) has pain interfered with the following?  1. General activity like being  able to carry out your everyday physical activities such as walking, climbing stairs, carrying groceries, or moving a chair?  Rating(8)    -Dye Allergies.  

## 2019-10-02 NOTE — Progress Notes (Signed)
   Jeanne Smith - 68 y.o. female MRN [id number]  Date of birth: August 20, 1951  Office Visit Note: Visit Date: 10/02/2019 PCP: Elby Showers, MD Referred by: Elby Showers, MD  Subjective: Chief Complaint  Patient presents with  . Right Hip - Pain  . Right Thigh - Pain   HPI:  Jeanne Smith is a 68 y.o. female who comes in today For repeat intra-articular right hip injection.  She is status post total left hip arthroplasty.  Last injection was in April 2019 and patient reports doing quite well up until the last several months.  She has had some other issues going on.  She has had no new trauma.  She is using tramadol for pain relief.  She reports pain 8 out of 10.  ROS Otherwise per HPI.  Assessment & Plan: Visit Diagnoses:  1. Pain in right hip     Plan: No additional findings.   Meds & Orders: No orders of the defined types were placed in this encounter.   Orders Placed This Encounter  Procedures  . Large Joint Inj: R hip joint  . C-ARM NO Order    Follow-up: Return if symptoms worsen or fail to improve.   Procedures: Large Joint Inj: R hip joint on 10/02/2019 3:00 PM Indications: pain and diagnostic evaluation Details: 22 G needle, anterior approach  Arthrogram: Yes  Medications: 4 mL bupivacaine 0.25 %; 60 mg triamcinolone acetonide 40 MG/ML Outcome: tolerated well, no immediate complications  Arthrogram demonstrated excellent flow of contrast throughout the joint surface without extravasation or obvious defect.  The patient had relief of symptoms during the anesthetic phase of the injection.  Procedure, treatment alternatives, risks and benefits explained, specific risks discussed. Consent was given by the patient. Immediately prior to procedure a time out was called to verify the correct patient, procedure, equipment, support staff and site/side marked as required. Patient was prepped and draped in the usual sterile fashion.      No notes on file   Clinical  History: Intra-articular Left  hip injection  09/28/2017  Intra-articular Right hip injection 09/14/2017     Objective:  VS:  HT:    WT:   BMI:     BP:   HR: bpm  TEMP: ( )  RESP:  Physical Exam  Ortho Exam Imaging: No results found.

## 2019-10-10 ENCOUNTER — Other Ambulatory Visit: Payer: Self-pay | Admitting: Internal Medicine

## 2019-10-10 MED ORDER — AMPHETAMINE-DEXTROAMPHET ER 15 MG PO CP24: ORAL_CAPSULE | ORAL | 0 refills | Status: DC

## 2019-10-10 NOTE — Telephone Encounter (Signed)
Jeanne Smith [phone]  [facility]  amphetamine-dextroamphetamine (ADDERALL XR) 15 MG 24 hr capsule   [name] called to get a refill on above medication.

## 2019-10-10 NOTE — Telephone Encounter (Signed)
Last refill 08/25/2019.

## 2019-10-21 ENCOUNTER — Other Ambulatory Visit: Payer: Self-pay | Admitting: Internal Medicine

## 2019-10-21 ENCOUNTER — Encounter: Payer: Self-pay | Admitting: Internal Medicine

## 2019-10-21 MED ORDER — ALPRAZOLAM 0.25 MG PO TABS: 0.2500 mg | ORAL_TABLET | Freq: Two times a day (BID) | ORAL | 0 refills | Status: DC | PRN

## 2019-10-21 NOTE — Telephone Encounter (Addendum)
Received Fax RX request from  [facility], [place] [phone] (Phone) [phone] (Fax)     Medication - ALPRAZolam (XANAX) 0.25 MG tablet   Last Refill - 09/12/19  Last OV - 06/19/19  Last CPE - 10/04/18  Next Appointment - 11/03/19  Refill for one month-MJB,MD

## 2019-10-22 ENCOUNTER — Other Ambulatory Visit: Payer: Self-pay

## 2019-10-30 ENCOUNTER — Other Ambulatory Visit: Payer: Medicare Other | Admitting: Internal Medicine

## 2019-10-30 DIAGNOSIS — L649 Androgenic alopecia, unspecified: Secondary | ICD-10-CM | POA: Diagnosis not present

## 2019-10-30 DIAGNOSIS — L720 Epidermal cyst: Secondary | ICD-10-CM | POA: Diagnosis not present

## 2019-10-30 DIAGNOSIS — L738 Other specified follicular disorders: Secondary | ICD-10-CM | POA: Diagnosis not present

## 2019-10-30 DIAGNOSIS — L0109 Other impetigo: Secondary | ICD-10-CM | POA: Diagnosis not present

## 2019-10-30 DIAGNOSIS — L650 Telogen effluvium: Secondary | ICD-10-CM | POA: Diagnosis not present

## 2019-11-03 ENCOUNTER — Encounter: Payer: Self-pay | Admitting: Internal Medicine

## 2019-11-03 ENCOUNTER — Ambulatory Visit (INDEPENDENT_AMBULATORY_CARE_PROVIDER_SITE_OTHER): Payer: Medicare Other | Admitting: Internal Medicine

## 2019-11-03 ENCOUNTER — Other Ambulatory Visit: Payer: Self-pay

## 2019-11-03 VITALS — BP 110/70 | HR 103 | Temp 98.0°F | Ht 65.0 in | Wt 136.0 lb

## 2019-11-03 DIAGNOSIS — M1611 Unilateral primary osteoarthritis, right hip: Secondary | ICD-10-CM

## 2019-11-03 DIAGNOSIS — F439 Reaction to severe stress, unspecified: Secondary | ICD-10-CM

## 2019-11-03 DIAGNOSIS — Z23 Encounter for immunization: Secondary | ICD-10-CM

## 2019-11-03 DIAGNOSIS — Z0000 Encounter for general adult medical examination without abnormal findings: Secondary | ICD-10-CM

## 2019-11-03 DIAGNOSIS — F908 Attention-deficit hyperactivity disorder, other type: Secondary | ICD-10-CM

## 2019-11-03 DIAGNOSIS — E042 Nontoxic multinodular goiter: Secondary | ICD-10-CM

## 2019-11-03 DIAGNOSIS — F411 Generalized anxiety disorder: Secondary | ICD-10-CM

## 2019-11-03 DIAGNOSIS — I10 Essential (primary) hypertension: Secondary | ICD-10-CM

## 2019-11-03 DIAGNOSIS — G4709 Other insomnia: Secondary | ICD-10-CM

## 2019-11-03 NOTE — Patient Instructions (Addendum)
Needs fasting labs. Flu vaccine given.  Consider repeat ultrasound of thyroid in the near future.  History of thyroid nodules.  Continue current medications.  Follow-up in 6 months.

## 2019-11-03 NOTE — Progress Notes (Signed)
Subjective:    Patient ID: Jeanne Smith, female    DOB: March 02, 1951, 68 y.o.   MRN: [id number]  HPI 68 year old Female for Medicare wellness, health maintenance exam and evaluation of medical issues. Given flu vaccine today.  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  History of attention deficit disorder treated with Adderall.  In July 2016 she suffered a comminuted intertrochanteric fracture of right femur and comminuted fracture of right humeral head.  This occurred after a fall on concrete while holding her dog.  Intertrochanteric fracture required nailing by Dr. Fredonia Highland.  She had left hip arthroplasty by Dr. Alvan Dame June 2019.  Needs to have spine surgery of right hip.  Has chronic right hip pain and needs to take tramadol for pain control.  History of labile hypertension and has seen Dr. Peter Martinique for that.  Continues to situational stress with husband's estate.  History of right thyroid nodule initially worked up in 03-21-83 showing a hot nodule by thyroid scan.  It was aspirated 1985 and was found to be benign.  Repeat thyroid screen in Mar 20, 1997 showed a high right thyroid nodule in his right lower lobe.  Ultrasound in May 1998 revealed a 3.8 solid hyperfunctioning nodule in the right lower lobe of the thyroid compatible with a functioning adenoma.  She has seen Dr. Forde Dandy in the past.  Last ultrasound of thyroid was in 03/21/2007 showing a dominant nodule in the right lobe near the isthmus measuring 3.1 x 1.1 x 2.1 cm.  Other nodules are present bilaterally.  Ultrasound needs to be repeated in the near future.  History of uterine fibroids and ovarian cyst followed by Dr. Ottis Stain.  History of cervical dysplasia.  Tonsillectomy ganglionectomy as a child.  Right lateral breast lymph node excision by Dr. Mendel Ryder in 20-Mar-1996 which was benign.  D&C after miscarriage in 20-Mar-1994.  Bilateral tubal ligation in 03/20/94.  Breast augmentation 1998 by Dr. Dessie Coma.  Says she  may need to have one implant replaced.  Removal of endocervical polyp by Dr. Nori Riis February 1999.  She smoked occasionally in college.  Drinks wine nightly with her mother.  She is a former Writer.  No known drug allergies  Hysterectomy 03-20-05  Neck injury 03/20/04  Family history: Father died at age 70 with hypertension, prostate and lung cancer.  Mother fairly healthy but elderly.  Paternal grandmother with thyroid disease.  Brother died at age 75 in 40 with history of alcoholism and experienced a spontaneous cardiac arrest.  Adult son who is a recovering alcoholic.  2 adult daughters.       Review of Systems  Respiratory: Negative.   Cardiovascular: Negative.   Gastrointestinal: Negative.   Musculoskeletal:       Chronic right hip pain  Neurological: Negative.   Psychiatric/Behavioral:       Situational stress with family and husband's estate       Objective:   Physical Exam BP 110/70, Pulse 103, Temp 98, Wt 136 pounds.  Skin warm and dry.  Nodes none.  Neck is supple without JVD thyromegaly or carotid bruits.  Chest clear to auscultation.  Cardiac exam regular rate and rhythm normal S1 and S2 without murmurs or gallops.  Abdomen no hepatosplenomegaly masses or tenderness.  Extremities without edema.  Neuro no gross focal deficits on brief neurological exam.  Thought, judgment, and affect are normal.       Assessment & Plan:  Essential HTN  blood pressure is excellent today at 110/70  Anxiety  Insomnia  Attention deficit disorder  Right hip osteoarthritis- needs arthroplasty  Situational stress  History of thyroid nodules-needs to have repeat thyroid ultrasound  Plan: Continue current medications.  Would like for her to have right hip arthroplasty as soon as pandemic is under control.  She is taking tramadol for pain.  Continue Ambien for insomnia and Adderall for attention deficit disorder.  She takes Xanax occasionally for anxiety.  We will follow-up in 6  months or as needed.  Subjective:   Patient presents for Medicare Annual/Subsequent preventive examination.  Review Past Medical/Family/Social: See above   Risk Factors  Current exercise habits: Walks around the neighborhood Dietary issues discussed: Low-fat low carbohydrate  Cardiac risk factors: No family history, no personal history, lipids are normal  Depression Screen  (Note: if answer to either of the following is "Yes", a more complete depression screening is indicated)   Over the past two weeks, have you felt down, depressed or hopeless? No  Over the past two weeks, have you felt little interest or pleasure in doing things? No Have you lost interest or pleasure in daily life? No Do you often feel hopeless? No Do you cry easily over simple problems? No   Activities of Daily Living  In your present state of health, do you have any difficulty performing the following activities?:   Driving? No  Managing money? No  Feeding yourself? No  Getting from bed to chair? No  Climbing a flight of stairs? No  Preparing food and eating?: No  Bathing or showering? No  Getting dressed: No  Getting to the toilet? No  Using the toilet:No  Moving around from place to place: No  In the past year have you fallen or had a near fall?:  Yes Are you sexually active?  Yes Do you have more than one partner? No   Hearing Difficulties: No  Do you often ask people to speak up or repeat themselves? No  Do you experience ringing or noises in your ears? No  Do you have difficulty understanding soft or whispered voices? No  Do you feel that you have a problem with memory? No Do you often misplace items?  Yes   Home Safety:  Do you have a smoke alarm at your residence? Yes Do you have grab bars in the bathroom?  Yes Do you have throw rugs in your house?  one   Cognitive Testing  Alert? Yes Normal Appearance?Yes  Oriented to person? Yes Place? Yes  Time? Yes  Recall of three objects?  Yes  Can perform simple calculations? Yes  Displays appropriate judgment?Yes  Can read the correct time from a watch face?Yes   List the Names of Other Physician/Practitioners you currently use:  See referral list for the physicians patient is currently seeing.     Review of Systems: See above   Objective:     General appearance: Appears stated age and mildly obese  Head: Normocephalic, without obvious abnormality, atraumatic  Eyes: conj clear, EOMi PEERLA  Ears: normal TM's and external ear canals both ears  Nose: Nares normal. Septum midline. Mucosa normal. No drainage or sinus tenderness.  Throat: lips, mucosa, and tongue normal; teeth and gums normal  Neck: no adenopathy, no carotid bruit, no JVD, supple, symmetrical, trachea midline and thyroid not enlarged, symmetric, no tenderness/mass/nodules  No CVA tenderness.  Lungs: clear to auscultation bilaterally  Breasts: normal appearance, no masses or tenderness Heart: regular  rate and rhythm, S1, S2 normal, no murmur, click, rub or gallop  Abdomen: soft, non-tender; bowel sounds normal; no masses, no organomegaly  Musculoskeletal: ROM normal in all joints, no crepitus, no deformity, Normal muscle strengthen. Back  is symmetric, no curvature. Skin: Skin color, texture, turgor normal. No rashes or lesions  Lymph nodes: Cervical, supraclavicular, and axillary nodes normal.  Neurologic: CN 2 -12 Normal, Normal symmetric reflexes. Normal coordination and gait  Psych: Alert & Oriented x 3, Mood appear stable.    Assessment:    Annual wellness medicare exam   Plan:    During the course of the visit the patient was educated and counseled about appropriate screening and preventive services including:  See above      Patient Instructions (the written plan) was given to the patient.  Medicare Attestation  I have personally reviewed:  The patient's medical and social history  Their use of alcohol, tobacco or illicit drugs   Their current medications and supplements  The patient's functional ability including ADLs,fall risks, home safety risks, cognitive, and hearing and visual impairment  Diet and physical activities  Evidence for depression or mood disorders  The patient's weight, height, BMI, and visual acuity have been recorded in the chart. I have made referrals, counseling, and provided education to the patient based on review of the above and I have provided the patient with a written personalized care plan for preventive services.

## 2019-11-10 ENCOUNTER — Other Ambulatory Visit: Payer: Medicare Other | Admitting: Internal Medicine

## 2019-11-10 ENCOUNTER — Other Ambulatory Visit: Payer: Self-pay

## 2019-11-11 LAB — COMPLETE METABOLIC PANEL WITH GFR
AG Ratio: 1.3 (calc) (ref 1.0–2.5)
ALT: 53 U/L — ABNORMAL HIGH (ref 6–29)
AST: 64 U/L — ABNORMAL HIGH (ref 10–35)
Albumin: 3.8 g/dL (ref 3.6–5.1)
Alkaline phosphatase (APISO): 91 U/L (ref 37–153)
BUN: 11 mg/dL (ref 7–25)
CO2: 27 mmol/L (ref 20–32)
Calcium: 10.3 mg/dL (ref 8.6–10.4)
Chloride: 97 mmol/L — ABNORMAL LOW (ref 98–110)
Creat: 0.74 mg/dL (ref 0.50–0.99)
GFR, Est African American: 96 mL/min/{1.73_m2} (ref >=60)
GFR, Est Non African American: 83 mL/min/{1.73_m2} (ref >=60)
Globulin: 3 g/dL (calc) (ref 1.9–3.7)
Glucose, Bld: 130 mg/dL — ABNORMAL HIGH (ref 65–99)
Potassium: 4.1 mmol/L (ref 3.5–5.3)
Sodium: 135 mmol/L (ref 135–146)
Total Bilirubin: 0.6 mg/dL (ref 0.2–1.2)
Total Protein: 6.8 g/dL (ref 6.1–8.1)

## 2019-11-11 LAB — CBC WITH DIFFERENTIAL/PLATELET
Absolute Monocytes: 525 cells/uL (ref 200–950)
Basophils Absolute: 67 cells/uL (ref 0–200)
Basophils Relative: 1.1 %
Eosinophils Absolute: 79 cells/uL (ref 15–500)
Eosinophils Relative: 1.3 %
HCT: 42.1 % (ref 35.0–45.0)
Hemoglobin: 14.8 g/dL (ref 11.7–15.5)
Lymphs Abs: 1909 cells/uL (ref 850–3900)
MCH: 33.6 pg — ABNORMAL HIGH (ref 27.0–33.0)
MCHC: 35.2 g/dL (ref 32.0–36.0)
MCV: 95.5 fL (ref 80.0–100.0)
MPV: 11.4 fL (ref 7.5–12.5)
Monocytes Relative: 8.6 %
Neutro Abs: 3520 cells/uL (ref 1500–7800)
Neutrophils Relative %: 57.7 %
Platelets: 252 10*3/uL (ref 140–400)
RBC: 4.41 10*6/uL (ref 3.80–5.10)
RDW: 11.3 % (ref 11.0–15.0)
Total Lymphocyte: 31.3 %
WBC: 6.1 10*3/uL (ref 3.8–10.8)

## 2019-11-11 LAB — HEMOGLOBIN A1C
Hgb A1c MFr Bld: 5.4 % of total Hgb (ref <5.7)
Mean Plasma Glucose: 108 (calc)
eAG (mmol/L): 6 (calc)

## 2019-11-11 LAB — LIPID PANEL
Cholesterol: 191 mg/dL (ref <200)
HDL: 94 mg/dL (ref >=50)
LDL Cholesterol (Calc): 82 mg/dL (calc)
Non-HDL Cholesterol (Calc): 97 mg/dL (calc) (ref <130)
Total CHOL/HDL Ratio: 2 (calc) (ref <5.0)
Triglycerides: 72 mg/dL (ref <150)

## 2019-11-11 LAB — TSH: TSH: 0.59 mIU/L (ref 0.40–4.50)

## 2019-11-14 ENCOUNTER — Other Ambulatory Visit: Payer: Self-pay | Admitting: Internal Medicine

## 2019-11-14 MED ORDER — AMPHETAMINE-DEXTROAMPHET ER 15 MG PO CP24: ORAL_CAPSULE | ORAL | 0 refills | Status: DC

## 2019-11-14 NOTE — Telephone Encounter (Signed)
Jeanne Smith [phone]  [name] called to say she needs refill on below medication.  amphetamine-dextroamphetamine (ADDERALL XR) 15 MG 24 hr capsule   [id number] - [place], [place] - [address] AT [address] Phone:  [phone]  Fax:  [phone]

## 2019-11-14 NOTE — Telephone Encounter (Signed)
Last refill 10/10/2019

## 2019-11-25 LAB — POCT URINALYSIS DIPSTICK
Appearance: NEGATIVE
Bilirubin, UA: NEGATIVE
Blood, UA: NEGATIVE
Glucose, UA: NEGATIVE
Ketones, UA: NEGATIVE
Leukocytes, UA: NEGATIVE
Nitrite, UA: NEGATIVE
Odor: NEGATIVE
Protein, UA: NEGATIVE
Spec Grav, UA: 1.015 (ref 1.010–1.025)
Urobilinogen, UA: 0.2 E.U./dL
pH, UA: 6.5 (ref 5.0–8.0)

## 2019-12-02 ENCOUNTER — Other Ambulatory Visit: Payer: Self-pay

## 2019-12-02 MED ORDER — AMLODIPINE BESYLATE 5 MG PO TABS: 5.0000 mg | ORAL_TABLET | Freq: Every day | ORAL | 3 refills | Status: DC

## 2019-12-10 ENCOUNTER — Telehealth: Payer: Self-pay | Admitting: Internal Medicine

## 2019-12-10 ENCOUNTER — Other Ambulatory Visit: Payer: Self-pay

## 2019-12-10 MED ORDER — ZOLPIDEM TARTRATE 10 MG PO TABS: 10.0000 mg | ORAL_TABLET | Freq: Every evening | ORAL | 2 refills | Status: DC | PRN

## 2019-12-10 NOTE — Telephone Encounter (Signed)
Received Fax RX request from  [facility], [address] AT [place] Phone:  [phone]  Fax:  [phone]       Medication - zolpidem (AMBIEN) 10 MG tablet    Last Refill - 11/13/19  Last OV - 11/03/19  Last CPE - 11/03/19  Next Appointment - 05/03/20

## 2019-12-15 ENCOUNTER — Other Ambulatory Visit: Payer: Self-pay

## 2019-12-15 ENCOUNTER — Telehealth: Payer: Self-pay | Admitting: Internal Medicine

## 2019-12-15 MED ORDER — TRAMADOL HCL 50 MG PO TABS: 50.0000 mg | ORAL_TABLET | Freq: Two times a day (BID) | ORAL | 0 refills | Status: DC

## 2019-12-15 MED ORDER — HYDROCHLOROTHIAZIDE 25 MG PO TABS: 25.0000 mg | ORAL_TABLET | Freq: Every day | ORAL | 3 refills | Status: DC

## 2019-12-15 NOTE — Telephone Encounter (Signed)
Received Fax RX request from  [facility], [address] [address] [place] Phone:  [phone]  Fax:  [phone]       Medication - traMADol (ULTRAM) 50 MG tablet    Last Refill - 09/22/19  Last OV - 11/03/19  Last CPE - 11/03/19  Next Appointment - 02/01/20

## 2019-12-16 ENCOUNTER — Other Ambulatory Visit: Payer: Self-pay

## 2019-12-16 MED ORDER — AMPHETAMINE-DEXTROAMPHET ER 15 MG PO CP24: ORAL_CAPSULE | ORAL | 0 refills | Status: DC

## 2019-12-22 MED ORDER — BUPIVACAINE HCL 0.25 % IJ SOLN
4.0000 mL | INTRAMUSCULAR | Status: AC | PRN
  Administered 2019-10-02: 4 mL via INTRA_ARTICULAR

## 2019-12-22 MED ORDER — TRIAMCINOLONE ACETONIDE 40 MG/ML IJ SUSP
60.0000 mg | INTRAMUSCULAR | Status: AC | PRN
  Administered 2019-10-02: 60 mg via INTRA_ARTICULAR

## 2019-12-30 ENCOUNTER — Telehealth: Payer: Self-pay | Admitting: Physical Medicine and Rehabilitation

## 2019-12-30 NOTE — Telephone Encounter (Signed)
Scheduled for 2/3

## 2019-12-30 NOTE — Telephone Encounter (Signed)
Ok at her convenience

## 2019-12-31 ENCOUNTER — Other Ambulatory Visit: Payer: Self-pay

## 2019-12-31 ENCOUNTER — Encounter: Payer: Self-pay | Admitting: Physical Medicine and Rehabilitation

## 2019-12-31 ENCOUNTER — Ambulatory Visit (INDEPENDENT_AMBULATORY_CARE_PROVIDER_SITE_OTHER): Payer: Medicare Other | Admitting: Physical Medicine and Rehabilitation

## 2019-12-31 ENCOUNTER — Ambulatory Visit: Payer: Self-pay

## 2019-12-31 DIAGNOSIS — M25551 Pain in right hip: Secondary | ICD-10-CM

## 2019-12-31 NOTE — Progress Notes (Signed)
 .  Numeric Pain Rating Scale and Functional Assessment Average Pain 8   In the last MONTH (on 0-10 scale) has pain interfered with the following?  1. General activity like being  able to carry out your everyday physical activities such as walking, climbing stairs, carrying groceries, or moving a chair?  Rating(8)   +Driver, -BT, -Dye Allergies.  

## 2019-12-31 NOTE — Progress Notes (Signed)
   Jeanne Smith - 69 y.o. female MRN [id number]  Date of birth: 06-18-1951  Office Visit Note: Visit Date: 12/31/2019 PCP: Elby Showers, MD Referred by: Elby Showers, MD  Subjective: Chief Complaint  Patient presents with  . Right Hip - Pain   HPI:  Jeanne Smith is a 69 y.o. female who comes in today For planned diagnostic and therapeutic right hip anesthetic arthrogram.  Patient is status post prior intertrochanteric fracture of the right femur and now with end-stage arthritis of the right hip.  She is status post left total hip arthroplasty.  This was completed by Dr. Alvan Dame.  Last injection performed on the right hip was many months ago and she did get diagnostic relief potentially but she has had a lot going on since that time.  Since have seen her she has had no new trauma or falls.  We talked at great length today that depending on relief with the injection she should talk with Dr. Durward Fortes and potentially get opinion by Dr. Jean Rosenthal if she wants replacement from an anterior approach like she did on the left side.  ROS Otherwise per HPI.  Assessment & Plan: Visit Diagnoses:  1. Pain in right hip     Plan: Findings:  Patient had significant relief during the anesthetic phase of the injection.    Meds & Orders: No orders of the defined types were placed in this encounter.   Orders Placed This Encounter  Procedures  . Large Joint Inj: R hip joint  . XR C-ARM NO REPORT    Follow-up: No follow-ups on file.   Procedures: Large Joint Inj: R hip joint on 12/31/2019 2:08 PM Indications: pain and diagnostic evaluation Details: 22 G needle, anterior approach  Arthrogram: Yes  Medications: 4 mL bupivacaine 0.25 %; 60 mg triamcinolone acetonide 40 MG/ML Outcome: tolerated well, no immediate complications  Arthrogram demonstrated excellent flow of contrast throughout the joint surface without extravasation or obvious defect.  The patient had relief of symptoms  during the anesthetic phase of the injection.  Procedure, treatment alternatives, risks and benefits explained, specific risks discussed. Consent was given by the patient. Immediately prior to procedure a time out was called to verify the correct patient, procedure, equipment, support staff and site/side marked as required. Patient was prepped and draped in the usual sterile fashion.      No notes on file   Clinical History: Intra-articular Left  hip injection  09/28/2017  Intra-articular Right hip injection 09/14/2017     Objective:  VS:  HT:    WT:   BMI:     BP:   HR: bpm  TEMP: ( )  RESP:  Physical Exam  Ortho Exam Imaging: No results found.

## 2020-01-02 ENCOUNTER — Ambulatory Visit: Payer: Medicare Other | Attending: Internal Medicine

## 2020-01-02 DIAGNOSIS — Z23 Encounter for immunization: Secondary | ICD-10-CM

## 2020-01-02 NOTE — Progress Notes (Signed)
   Covid-19 Vaccination Clinic  Name:  Jeanne Smith    MRN: [id number] DOB: 11-28-1950  01/02/2020  [name] was observed post Covid-19 immunization for 15 minutes without incidence. She was provided with Vaccine Information Sheet and instruction to access the V-Safe system.   [name] was instructed to call 911 with any severe reactions post vaccine: Marland Kitchen Difficulty breathing  . Swelling of your face and throat  . A fast heartbeat  . A bad rash all over your body  . Dizziness and weakness    Immunizations Administered    Name Date Dose VIS Date Route   Pfizer COVID-19 Vaccine 01/02/2020  1:12 PM 0.3 mL 11/07/2019 Intramuscular   Manufacturer: [place]   Lot: [id number]   [place]: [id number]

## 2020-01-05 MED ORDER — BUPIVACAINE HCL 0.25 % IJ SOLN
4.0000 mL | INTRAMUSCULAR | Status: AC | PRN
  Administered 2019-12-31: 14:00:00 4 mL via INTRA_ARTICULAR

## 2020-01-05 MED ORDER — TRIAMCINOLONE ACETONIDE 40 MG/ML IJ SUSP
60.0000 mg | INTRAMUSCULAR | Status: AC | PRN
  Administered 2019-12-31: 60 mg via INTRA_ARTICULAR

## 2020-01-12 ENCOUNTER — Other Ambulatory Visit: Payer: Self-pay | Admitting: Internal Medicine

## 2020-01-12 MED ORDER — ALPRAZOLAM 0.25 MG PO TABS: 0.2500 mg | ORAL_TABLET | Freq: Two times a day (BID) | ORAL | 0 refills | Status: DC | PRN

## 2020-01-12 NOTE — Telephone Encounter (Signed)
Received Fax RX request from  [facility], [place] [address] [place] Phone:  [phone]  Fax:  [phone]       Medication - ALPRAZolam Duanne Moron) 0.25 MG tablet    Last Refill - 10/21/19  Last OV - 11/03/19  Last CPE - 11/03/19  Next Appointment - 05/03/2020

## 2020-01-16 ENCOUNTER — Other Ambulatory Visit: Payer: Self-pay

## 2020-01-16 ENCOUNTER — Ambulatory Visit (INDEPENDENT_AMBULATORY_CARE_PROVIDER_SITE_OTHER): Payer: Medicare Other | Admitting: Internal Medicine

## 2020-01-16 ENCOUNTER — Encounter: Payer: Self-pay | Admitting: Internal Medicine

## 2020-01-16 ENCOUNTER — Telehealth: Payer: Self-pay | Admitting: Internal Medicine

## 2020-01-16 VITALS — BP 110/80 | HR 111 | Temp 98.0°F | Ht 65.0 in | Wt 138.0 lb

## 2020-01-16 DIAGNOSIS — I10 Essential (primary) hypertension: Secondary | ICD-10-CM

## 2020-01-16 DIAGNOSIS — B009 Herpesviral infection, unspecified: Secondary | ICD-10-CM

## 2020-01-16 DIAGNOSIS — F988 Other specified behavioral and emotional disorders with onset usually occurring in childhood and adolescence: Secondary | ICD-10-CM

## 2020-01-16 MED ORDER — AMPHETAMINE-DEXTROAMPHET ER 15 MG PO CP24: ORAL_CAPSULE | ORAL | 0 refills | Status: DC

## 2020-01-16 MED ORDER — VALACYCLOVIR HCL 1 G PO TABS: 1000.0000 mg | ORAL_TABLET | Freq: Two times a day (BID) | ORAL | 0 refills | Status: DC

## 2020-01-16 NOTE — Telephone Encounter (Signed)
This afternoon

## 2020-01-16 NOTE — Telephone Encounter (Signed)
Jeanne Smith - Boyfriend [phone]  Jeanne Smith [phone]  Jeanne Smith called to say that Jeanne Smith has a really nasty fever blister, that is causing face to be swollen, very sore, may be infected, started 2 days ago. It is between lip and nose. He will bring her if she can be seen today.

## 2020-01-16 NOTE — Progress Notes (Signed)
   Subjective:    Patient ID: Jeanne Smith, female    DOB: 27-Oct-1951, 69 y.o.   MRN: [id number]  HPI 69 year old Female with history of hypertension and attention deficit disorder in today with lesion of her upper lip which had rather sudden onset.  Has been present for just a few days now.  It is tender.  She has been cleaning it well and putting some Neosporin ointment on it.  Never had anything like this previously.  Says female partner has history of Herpes simplex type I.    Review of Systems see above     Objective:   Physical Exam Blood pressure 110/80, pulse 111-patient is anxious. Temperature 98 degrees orally weight 138 pounds BMI 22.96  Just slightly left to the philtrum, she has a red papule with some crusting consistent with fever blister/herpes simplex type I infection  Her affect is normal.  Normal thought process and judgment.  Adderall is working well for her at current dose.       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  She feels that she does well with this dose  Essential hypertension-stable on current regimen  Plan: For new onset Herpes simplex type I it is recommended she be treated for 10 days with Valtrex 1 g twice daily.  For recurrence, she could take Valtrex 2 grams one time dose with in 48 hours of onset of lesion.

## 2020-01-16 NOTE — Telephone Encounter (Signed)
scheduled

## 2020-01-17 NOTE — Patient Instructions (Signed)
Take Valtrex 1 g twice daily for 10 days.  Adderall refilled.  Continue treatment for hypertension as previously prescribed.

## 2020-01-21 ENCOUNTER — Ambulatory Visit: Payer: Medicare Other

## 2020-01-27 ENCOUNTER — Ambulatory Visit: Payer: Medicare Other | Attending: Internal Medicine

## 2020-01-27 DIAGNOSIS — Z23 Encounter for immunization: Secondary | ICD-10-CM | POA: Insufficient documentation

## 2020-01-27 NOTE — Progress Notes (Signed)
   Covid-19 Vaccination Clinic  Name:  ZEOLA KINCAID    MRN: [id number] DOB: 12-02-1950  01/27/2020  Ms. Gunnells was observed post Covid-19 immunization for 15 minutes without incident. She was provided with Vaccine Information Sheet and instruction to access the V-Safe system.   Ms. Gambrell was instructed to call 911 with any severe reactions post vaccine: Marland Kitchen Difficulty breathing  . Swelling of face and throat  . A fast heartbeat  . A bad rash all over body  . Dizziness and weakness   Immunizations Administered    Name Date Dose VIS Date Route   Pfizer COVID-19 Vaccine 01/27/2020  1:44 PM 0.3 mL 11/07/2019 Intramuscular   Manufacturer: [place]   Lot: [id number]   [place]: [id number]

## 2020-01-30 DIAGNOSIS — H16223 Keratoconjunctivitis sicca, not specified as Sjogren's, bilateral: Secondary | ICD-10-CM | POA: Diagnosis not present

## 2020-01-30 DIAGNOSIS — H04123 Dry eye syndrome of bilateral lacrimal glands: Secondary | ICD-10-CM | POA: Diagnosis not present

## 2020-02-05 ENCOUNTER — Other Ambulatory Visit: Payer: Self-pay

## 2020-02-05 MED ORDER — OLMESARTAN MEDOXOMIL 40 MG PO TABS: ORAL_TABLET | ORAL | 1 refills | Status: DC

## 2020-02-09 ENCOUNTER — Telehealth: Payer: Self-pay | Admitting: Internal Medicine

## 2020-02-09 ENCOUNTER — Encounter: Payer: Self-pay | Admitting: Internal Medicine

## 2020-02-09 NOTE — Telephone Encounter (Signed)
Patient is a 69 year old Female with hx of anxiety, insomnia, situational stress, hypertension. and Attention deficit disorder who apparently has not been doing well over the weekend.   She is the widow of Dr. 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  Mr. Laurian Brim, her boyfriend, dropped a letter by here just now saying patient was lethargic and thought it was related to some eye drops prescribed by opthalmologist, Dr. Posey Pronto. Dr. Serita Grit office received same note, called here, and said nothing they prescribed would cause this reaction. Therefore, it is our recommendation that patient be seen in ED for evaluation including tox screen and labs.  Patient has had some situational stress recently with husband's estate and her children.One daughter lives here in [place]. Another daughter and son live in [place].

## 2020-02-09 NOTE — Telephone Encounter (Signed)
Spoke with patient by phone. She thinks she has a sinus infection. Dr. Ubaldo Glassing prescribed Keflex for scalp issue and that has helped sinus congestion. Says she is feeling better. Thinks Keflex has helped. Wants to see Dr. Bing Plume about her cataracts. Was tired over the weekend. Wanted to be in dark room. Says she is OK and does not need to go to ED.

## 2020-02-10 DIAGNOSIS — L859 Epidermal thickening, unspecified: Secondary | ICD-10-CM | POA: Diagnosis not present

## 2020-02-10 DIAGNOSIS — D485 Neoplasm of uncertain behavior of skin: Secondary | ICD-10-CM | POA: Diagnosis not present

## 2020-02-17 ENCOUNTER — Telehealth: Payer: Self-pay | Admitting: Internal Medicine

## 2020-02-17 MED ORDER — AMPHETAMINE-DEXTROAMPHET ER 15 MG PO CP24: ORAL_CAPSULE | ORAL | 0 refills | Status: DC

## 2020-02-17 NOTE — Telephone Encounter (Signed)
Please advise her we are going to discontinue prescribing ADD meds and suggest she contact Dr. Johnnye Sima at [facility]. We will refill for 90 days only giving her time to establish there. Same goes for her daughter.

## 2020-02-17 NOTE — Telephone Encounter (Signed)
Jeanne Smith [phone]  [name] called to say she needs refill on below medication.  amphetamine-dextroamphetamine (ADDERALL XR) 15 MG 24 hr capsule  [facility] [id number] - [place], [place] - [place] AT [place] Phone:  [phone]  Fax:  [phone]

## 2020-02-18 NOTE — Telephone Encounter (Addendum)
Called and spoke with patient to let her know that Dr Renold Genta will refill this medication for the next 90 Days but after that she will not. Patient was driving so she will call me back to get name and phone number of where she needs to go. Patient Verbalized understanding. Dr Rachel Moulds [facility] [phone] [address] [place], [place] [address] Phone [phone] Fax [phone]

## 2020-02-25 ENCOUNTER — Telehealth: Payer: Self-pay | Admitting: Internal Medicine

## 2020-02-25 NOTE — Telephone Encounter (Signed)
Called patient to let her know of Dr. Verlene Mayer recommendations no answer left detailed voicemail. Called patient's daughter no answer.

## 2020-02-25 NOTE — Telephone Encounter (Signed)
Jeanne Smith [phone]  Jeanne Smith called to say that the left side of her face hurts, runny nose, having trouble taking deep breaths, when she does it makes her cough. She staes it is getting worse instead of better. Would like to come in and get shots like you have done in the past. She had her 2 nd vaccine on 01/27/2020, no fever.

## 2020-02-25 NOTE — Telephone Encounter (Signed)
After speaking with Dr Renold Genta, patient needs to get COVID test before she can come in office since she is having COVID symptoms.  Called and LVM for patient to call office.

## 2020-02-26 ENCOUNTER — Encounter: Payer: Self-pay | Admitting: Internal Medicine

## 2020-02-26 ENCOUNTER — Telehealth (INDEPENDENT_AMBULATORY_CARE_PROVIDER_SITE_OTHER): Payer: Medicare Other | Admitting: Internal Medicine

## 2020-02-26 ENCOUNTER — Other Ambulatory Visit: Payer: Self-pay

## 2020-02-26 VITALS — Ht 65.0 in | Wt 138.0 lb

## 2020-02-26 DIAGNOSIS — Z20822 Contact with and (suspected) exposure to covid-19: Secondary | ICD-10-CM

## 2020-02-26 DIAGNOSIS — J0100 Acute maxillary sinusitis, unspecified: Secondary | ICD-10-CM

## 2020-02-26 MED ORDER — LEVOFLOXACIN 500 MG PO TABS: 500.0000 mg | ORAL_TABLET | Freq: Every day | ORAL | 0 refills | Status: DC

## 2020-02-26 NOTE — Telephone Encounter (Signed)
She cannot come into office without Covid result being known. We can do virtual visit but will not be able to administer Depomedrol  injection in office without Covid result. She could go to urgent care, have virtual visit here today or be seen next week here.

## 2020-02-26 NOTE — Telephone Encounter (Signed)
Patient called back states she got an appointment for a COVID test tomorrow at 9:45 she said today she feels worse chest congestion, runny nose no fever. Please advise.

## 2020-02-26 NOTE — Progress Notes (Addendum)
   Subjective:    Patient ID: Jeanne Smith, female    DOB: 1951-01-25, 69 y.o.   MRN: [id number]  HPI 69 year old Female seen regarding nasal congestion, fatigue and headache.  Due to coronavirus pandemic she was seen today by interactive audio and video telecommunications.She was unable to connect with video, so we continued with audio only.  She was identified using 2 identifiers as Jeanne Smith, a patient in this practice.  She is agreeable to visit in this format today.  Patient feels certain that she does not have COVID-19.  She had 2 immunizations for Covid February 5 in March 2 of this year.  Symptoms just started this week.  Patient reports that she had appointment earlier today for Covid test.  This test is not recorded in the [facility] system.  She has chest congestion.  No documented fever.  She wanted to come get Depo-Medrol injection and she does not like taking oral steroids.  She did not want to go to urgent care.  We felt strongly that we should have COVID-19 test result before she came into the office.  Therefore virtual visit was scheduled.    Review of Systems no nausea vomiting diarrhea myalgias shaking chills     Objective:   Physical Exam  She is unable to connect with video today so we continued with audio only.  She reports no fever.  She feels strongly that she has a sinus infection.  She is a Marine scientist by training.  Has symptoms consistent with sinusitis- see above      Assessment & Plan:  Acute respiratory infection  Acute maxillary sinusitis  To have COVID-19 ruled out.  Test pending  Plan: Patient was prescribed Levaquin 500 mg daily with food for 7 days.  She declined offer for oral steroids.  She will let me know if not improving in 48 hours or sooner if worse. Total time interviewing patient by phone, medical decision making and electronic prescribing of meds. 15 minutes

## 2020-03-11 ENCOUNTER — Other Ambulatory Visit: Payer: Self-pay | Admitting: Internal Medicine

## 2020-03-11 MED ORDER — ZOLPIDEM TARTRATE 10 MG PO TABS: 10.0000 mg | ORAL_TABLET | Freq: Every evening | ORAL | 0 refills | Status: DC | PRN

## 2020-03-11 NOTE — Telephone Encounter (Signed)
Pend #30 with one refill

## 2020-03-11 NOTE — Telephone Encounter (Signed)
Received Fax RX request from  Jeanne Smith, [place] AT [place] Phone:  [phone]  Fax:  [phone]       Medication - zolpidem (AMBIEN) 10 MG tablet   Last Refill - 02/11/2020  Last OV - 02/26/20  Last CPE - 11/03/19  Next Appointment - 05/03/20

## 2020-03-12 DIAGNOSIS — H52223 Regular astigmatism, bilateral: Secondary | ICD-10-CM | POA: Diagnosis not present

## 2020-03-12 DIAGNOSIS — H524 Presbyopia: Secondary | ICD-10-CM | POA: Diagnosis not present

## 2020-03-12 DIAGNOSIS — H25813 Combined forms of age-related cataract, bilateral: Secondary | ICD-10-CM | POA: Diagnosis not present

## 2020-03-12 DIAGNOSIS — H04123 Dry eye syndrome of bilateral lacrimal glands: Secondary | ICD-10-CM | POA: Diagnosis not present

## 2020-03-12 DIAGNOSIS — H5213 Myopia, bilateral: Secondary | ICD-10-CM | POA: Diagnosis not present

## 2020-03-12 DIAGNOSIS — H16223 Keratoconjunctivitis sicca, not specified as Sjogren's, bilateral: Secondary | ICD-10-CM | POA: Diagnosis not present

## 2020-03-14 NOTE — Patient Instructions (Signed)
Levaquin 500 mg daily for 7 days.

## 2020-03-16 ENCOUNTER — Other Ambulatory Visit: Payer: Self-pay

## 2020-03-16 MED ORDER — TRAMADOL HCL 50 MG PO TABS: 50.0000 mg | ORAL_TABLET | Freq: Two times a day (BID) | ORAL | 0 refills | Status: DC

## 2020-03-16 NOTE — Telephone Encounter (Signed)
Last refill 10/04/2020

## 2020-03-22 ENCOUNTER — Telehealth: Payer: Self-pay | Admitting: Internal Medicine

## 2020-03-22 ENCOUNTER — Encounter: Payer: Self-pay | Admitting: Internal Medicine

## 2020-03-22 ENCOUNTER — Other Ambulatory Visit: Payer: Self-pay | Admitting: Internal Medicine

## 2020-03-22 MED ORDER — AMPHETAMINE-DEXTROAMPHET ER 15 MG PO CP24: ORAL_CAPSULE | ORAL | 0 refills | Status: DC

## 2020-03-22 NOTE — Telephone Encounter (Signed)
Last filled on 2/15

## 2020-03-22 NOTE — Telephone Encounter (Signed)
See note from [place] regarding future refills. This is second 30 day Rx of 90 days before we stop refilling. Patient referred to [facility] and has not made appt yet.

## 2020-03-22 NOTE — Telephone Encounter (Signed)
This has been refilled.

## 2020-03-22 NOTE — Telephone Encounter (Signed)
Jeanne Smith [phone]  Jeanne Smith called to say she needs refill on below medication. I did remind her to call and schedule appointment with [facility], because Dr Renold Genta will only be refilling this medication 1 more time after this. She stated she thought we were going to make appointment, I let her know she needs to call and schedule appointment, and that she should go ahead and do this because I did not know how ling they were scheduling new patients out.  amphetamine-dextroamphetamine (ADDERALL XR) 15 MG 24 hr capsule  [facility] [id number] - [place], [place] - [place] AT [place] Phone:  [phone]  Fax:  [phone]

## 2020-04-01 ENCOUNTER — Other Ambulatory Visit: Payer: Self-pay | Admitting: Internal Medicine

## 2020-04-01 MED ORDER — ALPRAZOLAM 0.25 MG PO TABS: 0.2500 mg | ORAL_TABLET | Freq: Two times a day (BID) | ORAL | 0 refills | Status: DC | PRN

## 2020-04-01 NOTE — Telephone Encounter (Signed)
Received Fax RX request from  [place], [place] AT [place] Phone:  [phone]  Fax:  [phone]       Medication - ALPRAZolam Duanne Moron) 0.25 MG tablet   Last Refill - 01/12/20  Last OV - 02/26/2020  Last CPE - 11/03/19  Next Appointment - 05/03/20

## 2020-04-01 NOTE — Telephone Encounter (Signed)
Approve for 30 days with 1 refill

## 2020-04-02 DIAGNOSIS — H04123 Dry eye syndrome of bilateral lacrimal glands: Secondary | ICD-10-CM | POA: Diagnosis not present

## 2020-04-02 DIAGNOSIS — H16223 Keratoconjunctivitis sicca, not specified as Sjogren's, bilateral: Secondary | ICD-10-CM | POA: Diagnosis not present

## 2020-04-13 ENCOUNTER — Other Ambulatory Visit: Payer: Self-pay | Admitting: Internal Medicine

## 2020-04-13 MED ORDER — ZOLPIDEM TARTRATE 10 MG PO TABS: 10.0000 mg | ORAL_TABLET | Freq: Every evening | ORAL | 0 refills | Status: DC | PRN

## 2020-04-13 NOTE — Telephone Encounter (Signed)
Pt calling and wants to get refill on zolpidem (AMBIEN) 10 MG tablet, [facility] on [address] [place], [place] [address]

## 2020-04-21 ENCOUNTER — Other Ambulatory Visit: Payer: Self-pay

## 2020-04-21 MED ORDER — AMPHETAMINE-DEXTROAMPHET ER 15 MG PO CP24: ORAL_CAPSULE | ORAL | 0 refills | Status: DC

## 2020-04-21 NOTE — Telephone Encounter (Signed)
Patient called to request a refill on ADDERALL she is aware this is her last refill per discussion we had in March.

## 2020-04-30 ENCOUNTER — Other Ambulatory Visit: Payer: Self-pay

## 2020-04-30 ENCOUNTER — Other Ambulatory Visit: Payer: Medicare Other | Admitting: Internal Medicine

## 2020-04-30 DIAGNOSIS — E785 Hyperlipidemia, unspecified: Secondary | ICD-10-CM | POA: Diagnosis not present

## 2020-04-30 LAB — HEPATIC FUNCTION PANEL
AG Ratio: 1.5 (calc) (ref 1.0–2.5)
ALT: 148 U/L — ABNORMAL HIGH (ref 6–29)
AST: 192 U/L — ABNORMAL HIGH (ref 10–35)
Albumin: 4.3 g/dL (ref 3.6–5.1)
Alkaline phosphatase (APISO): 162 U/L — ABNORMAL HIGH (ref 37–153)
Bilirubin, Direct: 0.3 mg/dL — ABNORMAL HIGH (ref 0.0–0.2)
Globulin: 2.9 g/dL (calc) (ref 1.9–3.7)
Indirect Bilirubin: 0.5 mg/dL (calc) (ref 0.2–1.2)
Total Bilirubin: 0.8 mg/dL (ref 0.2–1.2)
Total Protein: 7.2 g/dL (ref 6.1–8.1)

## 2020-04-30 LAB — LIPID PANEL
Cholesterol: 248 mg/dL — ABNORMAL HIGH (ref <200)
HDL: 91 mg/dL (ref >=50)
LDL Cholesterol (Calc): 121 mg/dL (calc) — ABNORMAL HIGH
Non-HDL Cholesterol (Calc): 157 mg/dL (calc) — ABNORMAL HIGH (ref <130)
Total CHOL/HDL Ratio: 2.7 (calc) (ref <5.0)
Triglycerides: 255 mg/dL — ABNORMAL HIGH (ref <150)

## 2020-05-03 ENCOUNTER — Other Ambulatory Visit: Payer: Self-pay

## 2020-05-03 ENCOUNTER — Encounter: Payer: Self-pay | Admitting: Internal Medicine

## 2020-05-03 ENCOUNTER — Ambulatory Visit (INDEPENDENT_AMBULATORY_CARE_PROVIDER_SITE_OTHER): Payer: Medicare Other | Admitting: Internal Medicine

## 2020-05-03 VITALS — BP 88/60 | HR 111 | Ht 65.0 in | Wt 140.0 lb

## 2020-05-03 DIAGNOSIS — R748 Abnormal levels of other serum enzymes: Secondary | ICD-10-CM | POA: Diagnosis not present

## 2020-05-03 NOTE — Progress Notes (Signed)
   Subjective:    Patient ID: Jeanne Smith, female    DOB: [birth year], 69 y.o.   MRN: [id number]  HPI 69 year old Female for follow up on Hypertension, Hyperlipidemia, anxiety, insomnia, and situational stress.  History of hyperlipidemia treated with Crestor.  Continues to have chronic hip pain.  Has been sleeping on the couch rather than the bed.  Would like to have right hip arthroplasty in the near future.  He has had hip joint injections in November 2020 and February 2021 that helped temporarily.  She has a history of attention deficit issues but I will no longer be refilling that prescription.  She will be referred to Dr. Rachel Moulds.  She may not be taking Crestor regularly.  Lipid panel shows total cholesterol of 248 with triglycerides of 255 and an HDL cholesterol of 91.  6 months ago these were normal.  LDL was normal 6 months ago and is now 121.  Needs to watch her diet, cut back on alcohol consumption and try to be a bit more active.  She will have follow-up here July 8.  I am concerned because her liver functions were elevated.  Alkaline phosphatase is 162, SGOT 192 and SGPT 148.  LFTs have been mildly elevated with values less than 100 for a while now but they have increased considerably since checked 6 months ago.  She is going to have an ultrasound of the gallbladder liver and pancreas in the near future.  She takes Ambien to sleep but is also drinking alcohol daily which is concerning.  She is taking Benicar for hypertension as well as HCTZ and amlodipine.  She has Xanax to take for anxiety.  I am concerned about her taking mood altering drugs and consuming alcohol.  Advised her to cut back on alcohol and we will follow-up with liver functions in July.  Continues to have situational stress with daughter who remains unemployed.  She is worried about her son who has history of alcohol issues.  Has elderly mother who has had some health issues as well.  We have talked about counseling  previously for situational stress.  Review of Systems being treated for dry eye syndrome by opthalmologist     Objective:   Physical Exam Blood pressure is low today at 88/60.  Pulse is 111.  Pulse oximetry 97%.  Weight 140 pounds BMI 23.30.  Weight is increased 2 pounds since February.  Neck is supple without JVD thyromegaly or carotid bruits.  Chest clear to auscultation.  Cardiac exam regular rate and rhythm.  Abdomen soft nondistended without hepatosplenomegaly masses or tenderness.       Assessment & Plan:   Hypotension- stop amlodipine. Continue HCTZ and olmesartan and follow-up in early July.  Not sure why she is hypotensive.  Elevated liver functions-cut back on alcohol consumption and have ultrasound of gallbladder liver and pancreas.  Situational stress-consider counseling  History of hypertension on multidrug regimen  Primary osteoarthritis of right hip-needs hip arthroplasty and is considering doing this soon  Plan: See above and she will follow-up in early July.  This appointment required 35 minutes including precharting, chart review, review of results, face-to-face time with patient and medical decision making

## 2020-05-07 ENCOUNTER — Other Ambulatory Visit: Payer: Self-pay | Admitting: Internal Medicine

## 2020-05-07 MED ORDER — TRAMADOL HCL 50 MG PO TABS: 50.0000 mg | ORAL_TABLET | Freq: Two times a day (BID) | ORAL | 0 refills | Status: DC

## 2020-05-07 NOTE — Telephone Encounter (Signed)
Jeanne Smith [phone]  [name] called to say she needs a refill on below medication she took her last one this morning.  traMADol (ULTRAM) 50 MG tablet    [facility] #[id number] - [place], [place] - [address] [place] Phone:  [phone]  Fax:  [phone]

## 2020-05-07 NOTE — Telephone Encounter (Signed)
Refill once 

## 2020-05-10 DIAGNOSIS — H04123 Dry eye syndrome of bilateral lacrimal glands: Secondary | ICD-10-CM | POA: Diagnosis not present

## 2020-05-10 DIAGNOSIS — H16223 Keratoconjunctivitis sicca, not specified as Sjogren's, bilateral: Secondary | ICD-10-CM | POA: Diagnosis not present

## 2020-05-10 DIAGNOSIS — H25813 Combined forms of age-related cataract, bilateral: Secondary | ICD-10-CM | POA: Diagnosis not present

## 2020-05-14 ENCOUNTER — Other Ambulatory Visit: Payer: Self-pay | Admitting: Internal Medicine

## 2020-05-14 MED ORDER — ZOLPIDEM TARTRATE 10 MG PO TABS: 10.0000 mg | ORAL_TABLET | Freq: Every evening | ORAL | 0 refills | Status: DC | PRN

## 2020-05-14 NOTE — Telephone Encounter (Signed)
Refill x 30 days

## 2020-05-14 NOTE — Telephone Encounter (Signed)
Received Fax RX request from  [place], [place] AT [place] Phone:  [phone]  Fax:  [phone]       Medication - zolpidem (AMBIEN) 10 MG tablet  Last Refill - 04/13/20  Last OV - 05/03/20  Last CPE - 11/03/19  Next Appointment - 06/03/20

## 2020-05-23 ENCOUNTER — Encounter: Payer: Self-pay | Admitting: Internal Medicine

## 2020-05-23 NOTE — Patient Instructions (Signed)
Cut back on alcohol consumption.  Have ultrasound of gallbladder and pancreas.  Follow-up in early July.  Stop taking amlodipine and monitor blood pressure at home.  Once liver functions are adequately evaluated, perhaps we can move forward considering right hip arthroplasty.  Refer to Dr. Johnnye Sima regarding attention deficit issues.

## 2020-05-25 ENCOUNTER — Other Ambulatory Visit: Payer: Self-pay

## 2020-05-25 ENCOUNTER — Other Ambulatory Visit: Payer: Medicare Other | Admitting: Internal Medicine

## 2020-05-25 DIAGNOSIS — E785 Hyperlipidemia, unspecified: Secondary | ICD-10-CM | POA: Diagnosis not present

## 2020-05-25 DIAGNOSIS — R7989 Other specified abnormal findings of blood chemistry: Secondary | ICD-10-CM | POA: Diagnosis not present

## 2020-05-26 ENCOUNTER — Other Ambulatory Visit: Payer: Self-pay

## 2020-05-26 DIAGNOSIS — R7989 Other specified abnormal findings of blood chemistry: Secondary | ICD-10-CM

## 2020-05-26 LAB — HEPATIC FUNCTION PANEL
AG Ratio: 1.5 (calc) (ref 1.0–2.5)
ALT: 57 U/L — ABNORMAL HIGH (ref 6–29)
AST: 64 U/L — ABNORMAL HIGH (ref 10–35)
Albumin: 4.1 g/dL (ref 3.6–5.1)
Alkaline phosphatase (APISO): 112 U/L (ref 37–153)
Bilirubin, Direct: 0.1 mg/dL (ref 0.0–0.2)
Globulin: 2.7 g/dL (calc) (ref 1.9–3.7)
Indirect Bilirubin: 0.3 mg/dL (calc) (ref 0.2–1.2)
Total Bilirubin: 0.4 mg/dL (ref 0.2–1.2)
Total Protein: 6.8 g/dL (ref 6.1–8.1)

## 2020-05-26 LAB — TEST AUTHORIZATION

## 2020-05-26 LAB — LIPID PANEL
Cholesterol: 163 mg/dL (ref <200)
HDL: 68 mg/dL (ref >=50)
LDL Cholesterol (Calc): 72 mg/dL (calc)
Non-HDL Cholesterol (Calc): 95 mg/dL (calc) (ref <130)
Total CHOL/HDL Ratio: 2.4 (calc) (ref <5.0)
Triglycerides: 151 mg/dL — ABNORMAL HIGH (ref <150)

## 2020-05-27 ENCOUNTER — Ambulatory Visit
Admission: RE | Admit: 2020-05-27 | Discharge: 2020-05-27 | Disposition: A | Discharge status: Home / Self Care | Payer: Medicare Other | Source: Ambulatory Visit | Attending: Internal Medicine | Admitting: Internal Medicine

## 2020-05-27 DIAGNOSIS — R945 Abnormal results of liver function studies: Secondary | ICD-10-CM | POA: Diagnosis not present

## 2020-06-03 ENCOUNTER — Encounter: Payer: Self-pay | Admitting: Internal Medicine

## 2020-06-03 ENCOUNTER — Other Ambulatory Visit: Payer: Self-pay

## 2020-06-03 ENCOUNTER — Ambulatory Visit (INDEPENDENT_AMBULATORY_CARE_PROVIDER_SITE_OTHER): Payer: Medicare Other | Admitting: Internal Medicine

## 2020-06-03 VITALS — BP 110/80 | HR 94 | Ht 65.0 in | Wt 137.0 lb

## 2020-06-03 DIAGNOSIS — I10 Essential (primary) hypertension: Secondary | ICD-10-CM | POA: Diagnosis not present

## 2020-06-03 DIAGNOSIS — R748 Abnormal levels of other serum enzymes: Secondary | ICD-10-CM

## 2020-06-03 DIAGNOSIS — E782 Mixed hyperlipidemia: Secondary | ICD-10-CM

## 2020-06-03 DIAGNOSIS — F988 Other specified behavioral and emotional disorders with onset usually occurring in childhood and adolescence: Secondary | ICD-10-CM | POA: Diagnosis not present

## 2020-06-03 DIAGNOSIS — M25551 Pain in right hip: Secondary | ICD-10-CM

## 2020-06-03 NOTE — Progress Notes (Signed)
   Subjective:    Patient ID: Jeanne Smith, female    DOB: 12-03-1950, 69 y.o.   MRN: [id number]  HPI 69 year old female in today for follow-up of elevated liver function test.  Patient says she has changed her diet.  Starting to be more physically active although her hip pain is quite bad.  She is asking about seeing an orthopedist and we will make referral for right hip pain.  I really think she needs to get along with right hip arthroplasty so she can be more mobile.  She is cut back on alcohol consumption.  In early June lipid panel showed total cholesterol of 248 and triglycerides 255 with an LDL of 121.  Nail lipid panel has essentially normalized.  She is on Crestor 10 mg daily  She had recent ultrasound of gallbladder due to elevated liver functions.  Radiologist feels that she has increased liver echogenicity in the setting of fatty infiltration.  Is felt that she has cholelithiasis.  However she is asymptomatic with regard to right upper quadrant abdominal pain.  I think the liver functions are elevated due to fatty liver.  Her lipid panel has improved considerably with consistent taking her medication and watching her diet.  With regard to hip pain, she is being referred to Dr. Ninfa Linden.     Review of Systems see above     Objective:   Physical Exam  Blood pressure 110/80 pulse 94 temperature 98 degrees orally weight 137 pounds height 5 feet 5 inches  Skin warm and dry.  Nodes none.  Chest clear.  Cardiac exam regular rate and rhythm.  No lower extremity edema.  Affect thought and judgment appear to be normal.      Assessment & Plan:  Hyperlipidemia-improved with diet changes and continues to be on Crestor generic 10 mg daily.  Elevated liver functions.  Alkaline phosphatase was 162 in early June, SGOT 192 and SGPT 148.  Ultrasound shows probable gallstones but also patient has cut back on alcohol consumption and liver functions have improved SGOT is now 64 and SGPT 57.   Alkaline phosphatase is normal.  Likely a combination of gallstones and alcohol consumption.  Patient has been counseled to cut back on alcohol consumption and this will be monitored every 6 months.  Attention deficit disorder-to see Dr. Gerilyn Nestle regarding attention deficit issues from now on.  Hip arthralgia-to see Dr. Ninfa Linden in the near future.  Continue current medications and follow-up in 6 months.  Continue to watch diet and try to exercise.  Referral made to orthopedist regarding hip pain.

## 2020-06-03 NOTE — Patient Instructions (Signed)
Liver functions have improved with decreasing ETOH intake. Is off Adderall. Can be seen at [facility] for evaluation. Liver ultrasound suggests fatty liver, but otherwise OK. Referral to orthopedist regarding arthroplasty.

## 2020-06-04 ENCOUNTER — Other Ambulatory Visit: Payer: Self-pay | Admitting: Internal Medicine

## 2020-06-04 MED ORDER — ALPRAZOLAM 0.25 MG PO TABS: 0.2500 mg | ORAL_TABLET | Freq: Two times a day (BID) | ORAL | 0 refills | Status: DC | PRN

## 2020-06-04 MED ORDER — TRAMADOL HCL 50 MG PO TABS: 50.0000 mg | ORAL_TABLET | Freq: Two times a day (BID) | ORAL | 0 refills | Status: DC

## 2020-06-04 NOTE — Telephone Encounter (Signed)
Received Fax RX request from  [place], [place] Phone:  [phone]  Fax:  [phone]       Medication -  ALPRAZolam Duanne Moron) 0.25 MG tablet   Last Refill - 04/01/2020  Medication - traMADol (ULTRAM) 50 MG tablet   Last Refill - 05/07/2020   Last OV - 06/03/2020  Last CPE - 11/03/19  Next Appointment - 12/06/20

## 2020-06-04 NOTE — Telephone Encounter (Signed)
Pend these

## 2020-06-14 DIAGNOSIS — H16223 Keratoconjunctivitis sicca, not specified as Sjogren's, bilateral: Secondary | ICD-10-CM | POA: Diagnosis not present

## 2020-06-14 DIAGNOSIS — H25813 Combined forms of age-related cataract, bilateral: Secondary | ICD-10-CM | POA: Diagnosis not present

## 2020-06-14 DIAGNOSIS — H524 Presbyopia: Secondary | ICD-10-CM | POA: Diagnosis not present

## 2020-06-14 DIAGNOSIS — H18452 Nodular corneal degeneration, left eye: Secondary | ICD-10-CM | POA: Diagnosis not present

## 2020-06-14 DIAGNOSIS — H04123 Dry eye syndrome of bilateral lacrimal glands: Secondary | ICD-10-CM | POA: Diagnosis not present

## 2020-06-15 ENCOUNTER — Other Ambulatory Visit: Payer: Self-pay

## 2020-06-15 ENCOUNTER — Other Ambulatory Visit: Payer: Self-pay | Admitting: Internal Medicine

## 2020-06-15 ENCOUNTER — Ambulatory Visit (INDEPENDENT_AMBULATORY_CARE_PROVIDER_SITE_OTHER): Payer: Medicare Other

## 2020-06-15 ENCOUNTER — Encounter: Payer: Self-pay | Admitting: Orthopaedic Surgery

## 2020-06-15 ENCOUNTER — Ambulatory Visit (INDEPENDENT_AMBULATORY_CARE_PROVIDER_SITE_OTHER): Payer: Medicare Other | Admitting: Orthopaedic Surgery

## 2020-06-15 VITALS — Ht 64.17 in | Wt 136.6 lb

## 2020-06-15 DIAGNOSIS — M25551 Pain in right hip: Secondary | ICD-10-CM

## 2020-06-15 DIAGNOSIS — Z969 Presence of functional implant, unspecified: Secondary | ICD-10-CM

## 2020-06-15 DIAGNOSIS — M1611 Unilateral primary osteoarthritis, right hip: Secondary | ICD-10-CM | POA: Insufficient documentation

## 2020-06-15 MED ORDER — ZOLPIDEM TARTRATE 10 MG PO TABS: 10.0000 mg | ORAL_TABLET | Freq: Every evening | ORAL | 0 refills | Status: DC | PRN

## 2020-06-15 NOTE — Telephone Encounter (Signed)
Refill once 

## 2020-06-15 NOTE — Progress Notes (Signed)
Office Visit Note   Patient: Jeanne Smith           Date of Birth: 02-19-1951           MRN: [id number] Visit Date: 06/15/2020              Requested by: Elby Showers, MD [address] [place],  [place] [address] PCP: Elby Showers, MD   Assessment & Plan: Visit Diagnoses:  1. Pain in right hip   2. Unilateral primary osteoarthritis, right hip   3. Retained orthopedic hardware     Plan: At this point I am recommending a hip replacement for her right hip.  This is a little bit more complicated because in the same setting we would need to remove the intramedullary rod and hip screw.  This can take more time surgically.  We would then perform an anterior hip surgery with a hip replacement after that.  This would be in the same setting.  I have done the surgery numerous times before.  I explained the difficulty in surgery such as this.  We talked about her interoperative and postoperative course and the risk and benefits of surgery in detail.  This is something she does wish to proceed with in the near future.  We will work on getting this scheduled.  All questions and concerns were answered and addressed.  Follow-Up Instructions: No follow-ups on file.  Follow-up will be established once surgery is established.  Orders:  Orders Placed This Encounter  Procedures  . XR HIP UNILAT W OR W/O PELVIS 2-3 VIEWS RIGHT   No orders of the defined types were placed in this encounter.     Procedures: No procedures performed   Clinical Data: No additional findings.   Subjective: Chief Complaint  Patient presents with  . Right Hip - Pain  The patient is a very pleasant 69 year old female who comes in for evaluation treatment of right hip pain and known worsening arthritis of the right hip.  She actually sustained a right hip intertrochanteric fracture after a mechanical fall in July 2016.  She underwent intramedullary nail and hip screw placement in that hip.  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  She also has a history of a left total hip arthroplasty through direct anterior approach in 2019 from one of my colleagues in town.  She has good friends with her own primary care physician who is referred her to me for addressing her right hip.  She said she would like me to do this as well.  She has daily hip pain which is 10 out of 10 with her right hip.  She is walking with a limp and she has a lot of pain in the groin.  At this point her hip pain is detriment affecting her mobility, her quality of life and her actives daily living.  She has no active medical issues or change in her medical status acutely.  HPI  Review of Systems She currently denies any headache, chest pain, shortness of breath, fever, chills, nausea, vomiting  Objective: Vital Signs: Ht 5' 4.17" (1.63 m)   Wt 136 lb 9.6 oz (62 kg)   BMI 23.32 kg/m   Physical Exam She is alert and orient x3 and in no acute distress Ortho Exam Examination of her left hip shows smooth and fluid range of motion.  Examination of her right hip shows significant stiffness with internal and external rotation with severe  pain in the groin with rotation of the right hip.  There are well-healed surgical incisions proximally from previous hip surgery. Specialty Comments:  No specialty comments available.  Imaging: XR HIP UNILAT W OR W/O PELVIS 2-3 VIEWS RIGHT  Result Date: 06/15/2020 A low AP pelvis and lateral the right hip shows a healed intertrochanteric fracture with a retained intramedullary rod and a large hip screw.  There is severe end-stage arthritis of the right hip.  Compared to previous films this is significantly worsened.  There is cystic changes in the femoral head and acetabulum with complete loss of joint space with flattening of the femoral head.    PMFS History: Patient Active Problem List   Diagnosis Date Noted  . Unilateral primary osteoarthritis, right hip  06/15/2020  . Retained orthopedic hardware 06/15/2020  . Stenosing tenosynovitis of wrist 04/01/2019  . Closed fracture of right wrist 01/29/2019  . Pain in right wrist 01/21/2019  . Pain in right hand 01/21/2019  . 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 S/P hip replacement 05/16/2018  . Pure hypercholesterolemia 12/24/2017  . Intertrochanteric fracture of right femur ([place]) 06/15/2015  . Comminuted right humeral fracture 06/15/2015  . HTN (hypertension) 06/15/2015   Past Medical History:  Diagnosis Date  . Anxiety   . Arthritis    oa  . Attention deficit disorder   . Dysrhythmia    episode of palpitations x 25 January 2018, stress test normal  . H/O seasonal allergies   . Hyperlipidemia   . Hypertension   . Hypothyroidism    thyroid nodules, off synthroid > 15 years  . Insomnia   . Osteoarthritis   . Osteopenia   . Thyroid nodule     Family History  Problem Relation Age of Onset  . Heart failure Mother   . Hypertension Father   . Cancer Father   . Stroke Father   . Heart disease Brother   . Alcoholism Brother     Past Surgical History:  Procedure Laterality Date  . ABDOMINAL HYSTERECTOMY    . AUGMENTATION MAMMAPLASTY Bilateral    silicone over 30 years  . CONVERSION TO TOTAL HIP Left 05/16/2018   Procedure: LEFT HIP ANTERIOR APPROACH;  Surgeon: Paralee Cancel, MD;  Location: [facility];  Service: Orthopedics;  Laterality: Left;  . FEMUR IM NAIL Right 06/16/2015   Procedure: INTRAMEDULLARY (IM) NAIL FEMORAL;  Surgeon: Renette Butters, MD;  Location: [facility];  Service: Orthopedics;  Laterality: Right;  . HIP SURGERY    . JOINT REPLACEMENT    . TUBAL LIGATION     Social History   Occupational History  . Not on file  Tobacco Use  . Smoking status: Former Smoker    Packs/day: 0.25    Types: Cigarettes    Quit date: 11/27/2005    Years since quitting: 14.5  . Smokeless tobacco: Never Used  Vaping Use  . Vaping Use: Never used  Substance and Sexual Activity  . Alcohol use: Yes      Alcohol/week: 7.0 standard drinks    Types: 7 Shots of liquor per week    Comment: scotch daily  . Drug use: No  . Sexual activity: Not on file

## 2020-06-15 NOTE — Telephone Encounter (Signed)
Jeanne Smith [phone]  [name] called to say she needs refill on below medication. She also said Thank you for referral to Dr Ninfa Linden, she loves him!  zolpidem (AMBIEN) 10 MG tablet  [facility] DR[facility] STORE [place], [place] Phone:  [phone]  Fax:  [phone]

## 2020-06-24 ENCOUNTER — Other Ambulatory Visit: Payer: Self-pay

## 2020-06-28 DIAGNOSIS — Z01419 Encounter for gynecological examination (general) (routine) without abnormal findings: Secondary | ICD-10-CM | POA: Diagnosis not present

## 2020-06-28 DIAGNOSIS — Z6823 Body mass index (BMI) 23.0-23.9, adult: Secondary | ICD-10-CM | POA: Diagnosis not present

## 2020-06-30 DIAGNOSIS — T8549XA Other mechanical complication of breast prosthesis and implant, initial encounter: Secondary | ICD-10-CM | POA: Diagnosis not present

## 2020-06-30 DIAGNOSIS — Z9882 Breast implant status: Secondary | ICD-10-CM | POA: Diagnosis not present

## 2020-07-02 ENCOUNTER — Telehealth: Payer: Self-pay | Admitting: Internal Medicine

## 2020-07-02 ENCOUNTER — Encounter: Payer: Self-pay | Admitting: Internal Medicine

## 2020-07-02 MED ORDER — TRAMADOL HCL 50 MG PO TABS: 50.0000 mg | ORAL_TABLET | Freq: Two times a day (BID) | ORAL | 0 refills | Status: DC

## 2020-07-02 NOTE — Telephone Encounter (Addendum)
Teea Ducey [phone]  [name] called to say she needs to get her below medication filled a few days early she is leaving at 3:00 to go out of town and needs to get it today. She is going to her daughters to baby sit. She stated she counted and the 30 days would be up today. She also said her surgery is scheduled.  traMADol (ULTRAM) 50 MG tablet  [facility] #[id number] - , [place] - [place] AT [place] Phone:  [phone]  Fax:  [phone]      Refilled once as requested.

## 2020-07-15 ENCOUNTER — Other Ambulatory Visit: Payer: Self-pay

## 2020-07-15 MED ORDER — ZOLPIDEM TARTRATE 10 MG PO TABS: 10.0000 mg | ORAL_TABLET | Freq: Every evening | ORAL | 0 refills | Status: DC | PRN

## 2020-07-20 ENCOUNTER — Other Ambulatory Visit: Payer: Self-pay

## 2020-07-20 MED ORDER — ROSUVASTATIN CALCIUM 10 MG PO TABS: 10.0000 mg | ORAL_TABLET | Freq: Every day | ORAL | 1 refills | Status: DC

## 2020-07-21 ENCOUNTER — Other Ambulatory Visit: Payer: Self-pay

## 2020-07-21 ENCOUNTER — Other Ambulatory Visit: Payer: Self-pay | Admitting: Orthopaedic Surgery

## 2020-07-21 MED ORDER — HYDROCODONE-ACETAMINOPHEN 5-325 MG PO TABS
1.0000 | ORAL_TABLET | Freq: Four times a day (QID) | ORAL | 0 refills | Status: DC | PRN
Start: 2020-07-21 — End: 2020-07-30

## 2020-07-23 ENCOUNTER — Encounter (HOSPITAL_COMMUNITY): Admission: RE | Admit: 2020-07-23 | Payer: Medicare Other | Source: Ambulatory Visit

## 2020-07-23 ENCOUNTER — Other Ambulatory Visit: Payer: Self-pay

## 2020-07-23 ENCOUNTER — Other Ambulatory Visit (HOSPITAL_COMMUNITY): Payer: Medicare Other

## 2020-07-23 MED ORDER — ROSUVASTATIN CALCIUM 10 MG PO TABS: 10.0000 mg | ORAL_TABLET | Freq: Every day | ORAL | 3 refills | Status: DC

## 2020-07-27 ENCOUNTER — Ambulatory Visit: Admit: 2020-07-27 | Payer: Medicare Other | Admitting: Orthopaedic Surgery

## 2020-07-27 SURGERY — ARTHROPLASTY, HIP, TOTAL,POSTERIOR APPROACH
Anesthesia: Choice | Site: Hip | Laterality: Right

## 2020-07-30 ENCOUNTER — Other Ambulatory Visit: Payer: Self-pay | Admitting: Orthopedic Surgery

## 2020-07-30 ENCOUNTER — Telehealth: Payer: Self-pay | Admitting: Orthopaedic Surgery

## 2020-07-30 MED ORDER — HYDROCODONE-ACETAMINOPHEN 5-325 MG PO TABS
1.0000 | ORAL_TABLET | Freq: Four times a day (QID) | ORAL | 0 refills | Status: DC | PRN
Start: 2020-07-30 — End: 2020-08-09

## 2020-07-30 NOTE — Telephone Encounter (Signed)
I called and sw pt advise rx has been sent to pharm.

## 2020-07-30 NOTE — Telephone Encounter (Signed)
Patient called.   She is requesting a refill on her Hydrocodone be sent to [facility] on [address]   Call back: [phone]

## 2020-07-30 NOTE — Telephone Encounter (Signed)
Rx sent 

## 2020-07-30 NOTE — Telephone Encounter (Signed)
Pt was to have a total hip 07/27/20 this was cx. She is requesting a refill on her hydrocodone last filled by [facility] 07/21/20 #30 please advise.

## 2020-08-09 ENCOUNTER — Other Ambulatory Visit: Payer: Self-pay | Admitting: Orthopedic Surgery

## 2020-08-09 ENCOUNTER — Other Ambulatory Visit: Payer: Self-pay

## 2020-08-09 MED ORDER — HYDROCODONE-ACETAMINOPHEN 5-325 MG PO TABS
1.0000 | ORAL_TABLET | Freq: Four times a day (QID) | ORAL | 0 refills | Status: DC | PRN
Start: 2020-08-09 — End: 2020-08-25

## 2020-08-09 MED ORDER — TRAMADOL HCL 50 MG PO TABS
50.0000 mg | ORAL_TABLET | Freq: Two times a day (BID) | ORAL | 0 refills | Status: DC
Start: 2020-08-09 — End: 2020-09-24

## 2020-08-09 NOTE — Telephone Encounter (Signed)
CB has been seeing pt. Was sch for a total hip last month that has been cx.

## 2020-08-09 NOTE — Telephone Encounter (Signed)
Please advise 

## 2020-08-09 NOTE — Telephone Encounter (Signed)
Duda patient °

## 2020-08-10 ENCOUNTER — Other Ambulatory Visit: Payer: Self-pay | Admitting: Internal Medicine

## 2020-08-10 ENCOUNTER — Inpatient Hospital Stay: Payer: Medicare Other | Admitting: Orthopaedic Surgery

## 2020-08-10 NOTE — Telephone Encounter (Signed)
It says patient taking this differently than prescribed. Please check with her to see how she is taking it

## 2020-08-13 ENCOUNTER — Telehealth: Payer: Self-pay | Admitting: Orthopaedic Surgery

## 2020-08-13 MED ORDER — GABAPENTIN 100 MG PO CAPS: 100.0000 mg | ORAL_CAPSULE | Freq: Three times a day (TID) | ORAL | 1 refills | Status: DC

## 2020-08-13 NOTE — Telephone Encounter (Signed)
Please advise 

## 2020-08-13 NOTE — Telephone Encounter (Signed)
2nd request on below medication  olmesartan (BENICAR) 40 MG tablet  [facility] #[id number] - [place], [place] - [place] AT [place] Phone:  [phone]  Fax:  [phone]

## 2020-08-13 NOTE — Telephone Encounter (Signed)
Unfortunately from the surgical standpoint, we cannot schedule her surgery just yet until they allow [place] to have patient stay overnight.  Her surgery is more complex and we would need to have her stay overnight.  I hear that they may start letting us schedule surgeries again by the second week of October.  In the interim, I will send in Neurontin 100 mg 3 times a day which may help augment her pain.

## 2020-08-13 NOTE — Telephone Encounter (Signed)
IC s/w patient and advised per Dr Trevor Mace note below.

## 2020-08-13 NOTE — Telephone Encounter (Signed)
Patient called advised she is in so much pain and the Hydrocodone is not helping to control the pain. Patient is concerned about falling. Patient asked if she can be scheduled sooner for surgery. The number to contact patient is [phone]

## 2020-08-16 ENCOUNTER — Other Ambulatory Visit: Payer: Self-pay | Admitting: Internal Medicine

## 2020-08-25 ENCOUNTER — Other Ambulatory Visit: Payer: Self-pay | Admitting: Orthopaedic Surgery

## 2020-08-25 ENCOUNTER — Telehealth: Payer: Self-pay | Admitting: Orthopaedic Surgery

## 2020-08-25 MED ORDER — HYDROCODONE-ACETAMINOPHEN 5-325 MG PO TABS: 1.0000 | ORAL_TABLET | Freq: Four times a day (QID) | ORAL | 0 refills | Status: DC | PRN

## 2020-08-25 NOTE — Telephone Encounter (Signed)
Patient called.   Requesting a call back to discuss issues she is having with her hip  Call back: [phone]

## 2020-08-25 NOTE — Telephone Encounter (Signed)
Patient states she is in incredible pain, she states she knows this surgery is considered "elective" but she can't function, she can't walk, and she needs help; she states she needs this surgery ASAP; she is taking the below medications  Gabapentin Hydrocodone (out) Tramadol

## 2020-08-25 NOTE — Telephone Encounter (Signed)
I spoke to her in length and did send in some hydrocodone.  Please see if you can get her in Thursday or Friday of this week for an intra-articular injection and her right hip by either Dr. Ernestina Patches or Dr. Junius Roads.  Dr. Ernestina Patches has injected her in the past but she would be fine with Dr. Junius Roads injecting her with a steroid in the right hip as well.  Thanks

## 2020-08-26 NOTE — Telephone Encounter (Signed)
Injection with [facility] tomorrow

## 2020-08-27 ENCOUNTER — Encounter: Payer: Self-pay | Admitting: Family Medicine

## 2020-08-27 ENCOUNTER — Ambulatory Visit (INDEPENDENT_AMBULATORY_CARE_PROVIDER_SITE_OTHER): Payer: Medicare Other | Admitting: Family Medicine

## 2020-08-27 ENCOUNTER — Other Ambulatory Visit: Payer: Self-pay

## 2020-08-27 ENCOUNTER — Ambulatory Visit: Payer: Self-pay

## 2020-08-27 DIAGNOSIS — M25551 Pain in right hip: Secondary | ICD-10-CM | POA: Diagnosis not present

## 2020-08-27 NOTE — Progress Notes (Signed)
Subjective: Patient is here with right hip pain.  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  She is hoping for for ultrasound-guided intra-articular hip injection.   She has responded to injections in the past.  Objective: Right hip has very limited passive range of motion and she walks with an antalgic gait.  Procedure: Ultrasound-guided right hip injection: After sterile prep with Betadine, injected 8 cc 1% lidocaine without epinephrine and 40 mg methylprednisolone using a 22-gauge spinal needle, passing the needle through the iliofemoral ligament into the femoral head/neck junction.  Injectate seen filling the joint capsule.  She had good immediate relief.

## 2020-09-01 ENCOUNTER — Telehealth: Payer: Self-pay | Admitting: Orthopaedic Surgery

## 2020-09-01 MED ORDER — HYDROCODONE-ACETAMINOPHEN 5-325 MG PO TABS: 1.0000 | ORAL_TABLET | Freq: Four times a day (QID) | ORAL | 0 refills | Status: DC | PRN

## 2020-09-01 NOTE — Telephone Encounter (Signed)
Please advise 

## 2020-09-01 NOTE — Telephone Encounter (Signed)
Patient called requesting a refill of hydrocodone. Please send to pharmacy on file. Patient phone number is [phone].

## 2020-09-07 ENCOUNTER — Other Ambulatory Visit: Payer: Self-pay | Admitting: Orthopaedic Surgery

## 2020-09-07 ENCOUNTER — Telehealth: Payer: Self-pay

## 2020-09-07 MED ORDER — HYDROCODONE-ACETAMINOPHEN 5-325 MG PO TABS: 1.0000 | ORAL_TABLET | Freq: Four times a day (QID) | ORAL | 0 refills | Status: DC | PRN

## 2020-09-07 NOTE — Telephone Encounter (Signed)
Patient called she is requesting prescription refill for hydrocodone ,she stated Dr.Blackman needs to sign off on the order call back:[phone]

## 2020-09-07 NOTE — Telephone Encounter (Signed)
Please advise 

## 2020-09-08 ENCOUNTER — Other Ambulatory Visit: Payer: Self-pay

## 2020-09-14 ENCOUNTER — Other Ambulatory Visit: Payer: Self-pay | Admitting: Orthopaedic Surgery

## 2020-09-14 ENCOUNTER — Other Ambulatory Visit: Payer: Self-pay | Admitting: Internal Medicine

## 2020-09-14 ENCOUNTER — Other Ambulatory Visit: Payer: Self-pay | Admitting: Physician Assistant

## 2020-09-14 ENCOUNTER — Telehealth: Payer: Self-pay

## 2020-09-14 MED ORDER — ZOLPIDEM TARTRATE 10 MG PO TABS
ORAL_TABLET | ORAL | 0 refills | Status: DC
Start: 2020-09-14 — End: 2020-10-15

## 2020-09-14 MED ORDER — ALPRAZOLAM 0.25 MG PO TABS
ORAL_TABLET | ORAL | 0 refills | Status: DC
Start: 2020-09-14 — End: 2020-11-15

## 2020-09-14 MED ORDER — HYDROCODONE-ACETAMINOPHEN 5-325 MG PO TABS: 1.5000 | ORAL_TABLET | Freq: Four times a day (QID) | ORAL | 0 refills | Status: DC | PRN

## 2020-09-14 NOTE — Patient Instructions (Addendum)
DUE TO COVID-19 ONLY ONE VISITOR IS ALLOWED TO COME WITH YOU AND STAY IN THE WAITING ROOM ONLY DURING PRE OP AND PROCEDURE DAY OF SURGERY. THE 1 VISITOR  MAY VISIT WITH YOU AFTER SURGERY IN YOUR PRIVATE ROOM DURING VISITING HOURS ONLY!  YOU NEED TO HAVE A COVID 19 TEST ON  10/26__ @_10 :10______, THIS TEST MUST BE DONE BEFORE SURGERY,  COVID TESTING SITE [place] [address], IT IS ON THE RIGHT GOING OUT [address] APPROXIMATELY  2 MINUTES PAST [employer] ON THE RIGHT. ONCE YOUR COVID TEST IS COMPLETED,  PLEASE BEGIN THE QUARANTINE INSTRUCTIONS AS OUTLINED IN YOUR HANDOUT.                KODY VIGIL    Your procedure is scheduled on: 09/24/20   Report to [facility] Main  Entrance   Report to admitting at  9:45 AM     Call this number if you have problems the morning of surgery [place], NO CHEWING GUM [place].   No food after midnight.    You may have clear liquid until 9:00 AM.   CLEAR LIQUID DIET   Foods Allowed                                                                     Foods Excluded  Coffee and tea, regular and decaf                             liquids that you cannot  Plain Jell-O any favor except red or purple                                           see through such as: Fruit ices (not with fruit pulp)                                     milk, soups, orange juice  Iced Popsicles                                    All solid food Carbonated beverages, regular and diet                                    Cranberry, grape and apple juices Sports drinks like Gatorade Lightly seasoned clear broth or consume(fat free) Sugar, honey syrup      At 9:00 AM drink pre surgery drink.   Nothing by mouth after 9:00 AM.    Take these medicines the morning of surgery with A SIP OF WATER: [place], Xanax if needed  You may not have any metal on your body including hair pins and              piercings  Do not wear jewelry, make-up, lotions, powders or perfumes, deodorant             Do not wear nail polish on your fingernails.  Do not shave  48 hours prior to surgery.  .   Do not bring valuables to the hospital. [place].  Contacts, dentures or bridgework may not be worn into surgery.        Name and phone number of your driver:  Special Instructions: N/A              Please read over the following fact sheets you were given: _____________________________________________________________________             [facility] - Preparing for Surgery Before surgery, you can play an important role.   Because skin is not sterile, your skin needs to be as free of germs as possible .  You can reduce the number of germs on your skin by washing with CHG (chlorahexidine gluconate) soap before surgery.   CHG is an antiseptic cleaner which kills germs and bonds with the skin to continue killing germs even after washing. Please DO NOT use if you have an allergy to CHG or antibacterial soaps.   If your skin becomes reddened/irritated stop using the CHG and inform your nurse when you arrive at Short Stay. Do not shave (including legs and underarms) for at least 48 hours prior to the first CHG shower.  . Please follow these instructions carefully:  1.  Shower with CHG Soap the night before surgery and the  morning of Surgery.  2.  If you choose to wash your hair, wash your hair first as usual with your  normal  shampoo.  3.  After you shampoo, rinse your hair and body thoroughly to remove the  shampoo.                                        4.  Use CHG as you would any other liquid soap.  You can apply chg directly  to the skin and wash                       Gently with a scrungie or clean washcloth.  5.  Apply the CHG Soap to your body ONLY FROM THE NECK DOWN.    Do not use on face/ open                           Wound or open sores. Avoid contact with eyes, ears mouth and genitals (private parts).                       Wash face,  Genitals (private parts) with your normal soap.             6.  Wash thoroughly, paying special attention to the area where your surgery  will be performed.  7.  Thoroughly rinse your body with warm water from the neck down.  8.  DO NOT shower/wash with your normal soap after using and rinsing  off  the CHG Soap.             9.  Pat yourself dry with a clean towel.            10.  Wear clean pajamas.            11.  Place clean sheets on your bed the night of your first shower and do not  sleep with pets. Day of Surgery : Do not apply any lotions/deodorants the morning of surgery.  Please wear clean clothes to the hospital/surgery center.  FAILURE TO FOLLOW THESE INSTRUCTIONS MAY RESULT IN THE CANCELLATION OF YOUR SURGERY PATIENT SIGNATURE_________________________________  NURSE SIGNATURE__________________________________  ________________________________________________________________________   Adam Phenix  An incentive spirometer is a tool that can help keep your lungs clear and active. This tool measures how well you are filling your lungs with each breath. Taking long deep breaths may help reverse or decrease the chance of developing breathing (pulmonary) problems (especially infection) following:  A long period of time when you are unable to move or be active. BEFORE THE PROCEDURE   If the spirometer includes an indicator to show your best effort, your nurse or respiratory therapist will set it to a desired goal.  If possible, sit up straight or lean slightly forward. Try not to slouch.  Hold the incentive spirometer in an upright position. INSTRUCTIONS FOR USE  1. Sit on the edge of your bed if possible, or sit up as far as you can in bed or on a chair. 2. Hold the incentive spirometer in an upright  position. 3. Breathe out normally. 4. Place the mouthpiece in your mouth and seal your lips tightly around it. 5. Breathe in slowly and as deeply as possible, raising the piston or the ball toward the top of the column. 6. Hold your breath for 3-5 seconds or for as long as possible. Allow the piston or ball to fall to the bottom of the column. 7. Remove the mouthpiece from your mouth and breathe out normally. 8. Rest for a few seconds and repeat Steps 1 through 7 at least 10 times every 1-2 hours when you are awake. Take your time and take a few normal breaths between deep breaths. 9. The spirometer may include an indicator to show your best effort. Use the indicator as a goal to work toward during each repetition. 10. After each set of 10 deep breaths, practice coughing to be sure your lungs are clear. If you have an incision (the cut made at the time of surgery), support your incision when coughing by placing a pillow or rolled up towels firmly against it. Once you are able to get out of bed, walk around indoors and cough well. You may stop using the incentive spirometer when instructed by your caregiver.  RISKS AND COMPLICATIONS  Take your time so you do not get dizzy or light-headed.  If you are in pain, you may need to take or ask for pain medication before doing incentive spirometry. It is harder to take a deep breath if you are having pain. AFTER USE  Rest and breathe slowly and easily.  It can be helpful to keep track of a log of your progress. Your caregiver can provide you with a simple table to help with this. If you are using the spirometer at home, follow these instructions: [place] IF:   You are having difficultly using the spirometer.  You have trouble using the spirometer as often as  instructed.  Your pain medication is not giving enough relief while using the spirometer.  You develop fever of 100.5 F (38.1 C) or higher. SEEK IMMEDIATE MEDICAL CARE IF:    You cough up bloody sputum that had not been present before.  You develop fever of 102 F (38.9 C) or greater.  You develop worsening pain at or near the incision site. MAKE SURE YOU:   Understand these instructions.  Will watch your condition.  Will get help right away if you are not doing well or get worse. Document Released: 03/26/2007 Document Revised: 02/05/2012 Document Reviewed: 05/27/2007 [facility] Patient Information 2014 [place], [place].   ________________________________________________________________________

## 2020-09-14 NOTE — Telephone Encounter (Signed)
Jeanne Smith [phone]  Jeanne Smith called to say she has a lot going on, she is taking more of her anxiety medication than normal. She had to have her son committed. Her surgery was put off until 09/24/2020.  She also stated she needs below medication, I asked her to contact pharmacy.  zolpidem (AMBIEN) 10 MG tablet  [id number] - [place], [place] Phone:  [phone]  Fax:  [phone]

## 2020-09-14 NOTE — Telephone Encounter (Signed)
Pended, please sign.

## 2020-09-14 NOTE — Telephone Encounter (Signed)
Please advise. Thanks.  

## 2020-09-14 NOTE — Telephone Encounter (Signed)
Patient called she is requesting prescription refill for hydrocodone call [phone]

## 2020-09-14 NOTE — Telephone Encounter (Signed)
Please refill Ambien and Xanax one time only for each

## 2020-09-15 ENCOUNTER — Encounter (HOSPITAL_COMMUNITY)
Admission: RE | Admit: 2020-09-15 | Discharge: 2020-09-15 | Disposition: A | Discharge status: Home / Self Care | Payer: Medicare Other | Source: Ambulatory Visit | Attending: Orthopaedic Surgery | Admitting: Orthopaedic Surgery

## 2020-09-15 ENCOUNTER — Other Ambulatory Visit: Payer: Self-pay

## 2020-09-15 ENCOUNTER — Encounter (HOSPITAL_COMMUNITY): Payer: Self-pay

## 2020-09-15 DIAGNOSIS — I10 Essential (primary) hypertension: Secondary | ICD-10-CM | POA: Diagnosis not present

## 2020-09-15 DIAGNOSIS — Z01818 Encounter for other preprocedural examination: Secondary | ICD-10-CM | POA: Insufficient documentation

## 2020-09-15 LAB — BASIC METABOLIC PANEL
Anion gap: 14 (ref 5–15)
BUN: 31 mg/dL — ABNORMAL HIGH (ref 8–23)
CO2: 25 mmol/L (ref 22–32)
Calcium: 10.2 mg/dL (ref 8.9–10.3)
Chloride: 102 mmol/L (ref 98–111)
Creatinine, Ser: 1.07 mg/dL — ABNORMAL HIGH (ref 0.44–1.00)
GFR, Estimated: 53 mL/min — ABNORMAL LOW (ref >60)
Glucose, Bld: 104 mg/dL — ABNORMAL HIGH (ref 70–99)
Potassium: 3.8 mmol/L (ref 3.5–5.1)
Sodium: 141 mmol/L (ref 135–145)

## 2020-09-15 LAB — CBC
HCT: 41.6 % (ref 36.0–46.0)
Hemoglobin: 14.1 g/dL (ref 12.0–15.0)
MCH: 34.3 pg — ABNORMAL HIGH (ref 26.0–34.0)
MCHC: 33.9 g/dL (ref 30.0–36.0)
MCV: 101.2 fL — ABNORMAL HIGH (ref 80.0–100.0)
Platelets: 139 10*3/uL — ABNORMAL LOW (ref 150–400)
RBC: 4.11 MIL/uL (ref 3.87–5.11)
RDW: 13.2 % (ref 11.5–15.5)
WBC: 5 10*3/uL (ref 4.0–10.5)
nRBC: 0 % (ref 0.0–0.2)

## 2020-09-15 LAB — SURGICAL PCR SCREEN
MRSA, PCR: NEGATIVE
Staphylococcus aureus: NEGATIVE

## 2020-09-15 NOTE — Progress Notes (Signed)
COVID Vaccine Completed:Yes Date COVID Vaccine completed:01/27/20 COVID vaccine manufacturer: Pfizer      PCP - Dr. Jerilynn Mages. [place] Cardiologist - Dr. P [place]  Chest x-ray - no EKG - 09/15/20 Stress Test - 2019-Epic ECHO - no Cardiac Cath - no Pacemaker/ICD device last checked:NA  Sleep Study -noCPAP -   Fasting Blood Sugar - NA Checks Blood Sugar _____ times a day  Blood Thinner Instructions:NA Aspirin Instructions: Last Dose:  Anesthesia review:   Patient denies shortness of breath, fever, cough and chest pain at PAT appointment yes   Patient verbalized understanding of instructions that were given to them at the PAT appointment. Patient was also instructed that they will need to review over the PAT instructions again at home before surgery. Yes Pt has an active life style. She has no SOB with activity.

## 2020-09-20 ENCOUNTER — Telehealth: Payer: Self-pay | Admitting: Orthopaedic Surgery

## 2020-09-20 MED ORDER — HYDROCODONE-ACETAMINOPHEN 5-325 MG PO TABS: 1.0000 | ORAL_TABLET | Freq: Four times a day (QID) | ORAL | 0 refills | Status: DC | PRN

## 2020-09-20 NOTE — Telephone Encounter (Signed)
Pt called asking for a refill of hydrocodone and states she will have surgery 09/24/20 and so she would like to know if she can take one the night before surgery   [phone]

## 2020-09-20 NOTE — Telephone Encounter (Signed)
Please advise 

## 2020-09-21 ENCOUNTER — Other Ambulatory Visit (HOSPITAL_COMMUNITY)
Admission: RE | Admit: 2020-09-21 | Discharge: 2020-09-21 | Disposition: A | Discharge status: Home / Self Care | Payer: Medicare Other | Source: Ambulatory Visit | Attending: Orthopaedic Surgery | Admitting: Orthopaedic Surgery

## 2020-09-21 DIAGNOSIS — Z01812 Encounter for preprocedural laboratory examination: Secondary | ICD-10-CM | POA: Insufficient documentation

## 2020-09-21 DIAGNOSIS — Z20822 Contact with and (suspected) exposure to covid-19: Secondary | ICD-10-CM | POA: Insufficient documentation

## 2020-09-21 LAB — SARS CORONAVIRUS 2 (TAT 6-24 HRS): SARS Coronavirus 2: NEGATIVE

## 2020-09-23 ENCOUNTER — Telehealth: Payer: Self-pay | Admitting: *Deleted

## 2020-09-23 NOTE — H&P (Signed)
TOTAL HIP ADMISSION H&P  Patient is admitted for right total hip arthroplasty.  Subjective:  Chief Complaint: right hip pain  HPI: Jeanne Smith, 69 y.o. female, has a history of pain and functional disability in the right hip(s) due to trauma and arthritis and patient has failed non-surgical conservative treatments for greater than 12 weeks to include NSAID's and/or analgesics, corticosteriod injections, flexibility and strengthening excercises, use of assistive devices and activity modification.  Onset of symptoms was gradual starting 3 years ago with rapidlly worsening course since that time.The patient noted prior procedures of the hip to include fixation of an intertrochanteric fracture with retained hardware on the right hip(s).  Patient currently rates pain in the right hip at 10 out of 10 with activity. Patient has night pain, worsening of pain with activity and weight bearing, trendelenberg gait, pain that interfers with activities of daily living and pain with passive range of motion. Patient has evidence of subchondral cysts, subchondral sclerosis, periarticular osteophytes and joint space narrowing by imaging studies. This condition presents safety issues increasing the risk of falls. This patient has had intramedullary nail and hip screw placement to treat a right intertrochanteric femur fracture.  There is no current active infection.  Patient Active Problem List   Diagnosis Date Noted  . Unilateral primary osteoarthritis, right hip 06/15/2020  . Retained orthopedic hardware 06/15/2020  . Stenosing tenosynovitis of wrist 04/01/2019  . Closed fracture of right wrist 01/29/2019  . Pain in right wrist 01/21/2019  . Pain in right hand 01/21/2019  . 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 S/P hip replacement 05/16/2018  . Pure hypercholesterolemia 12/24/2017  . Intertrochanteric fracture of right femur ([place]) 06/15/2015  . Comminuted right humeral fracture 06/15/2015  . HTN (hypertension)  06/15/2015   Past Medical History:  Diagnosis Date  . Anxiety   . Arthritis    oa  . Attention deficit disorder   . Dysrhythmia 2019   episode of palpitations x 25 January 2018, stress test normal  . H/O seasonal allergies   . Hyperlipidemia   . Hypertension   . Hypothyroidism    thyroid nodules, off synthroid > 15 years  . Insomnia   . Osteoarthritis   . Osteopenia   . Thyroid nodule     Past Surgical History:  Procedure Laterality Date  . ABDOMINAL HYSTERECTOMY    . AUGMENTATION MAMMAPLASTY Bilateral    silicone over 30 years  . CONVERSION TO TOTAL HIP Left 05/16/2018   Procedure: LEFT HIP ANTERIOR APPROACH;  Surgeon: Paralee Cancel, MD;  Location: [facility];  Service: Orthopedics;  Laterality: Left;  . FEMUR IM NAIL Right 06/16/2015   Procedure: INTRAMEDULLARY (IM) NAIL FEMORAL;  Surgeon: Renette Butters, MD;  Location: [place];  Service: Orthopedics;  Laterality: Right;  . HIP SURGERY    . JOINT REPLACEMENT    . TUBAL LIGATION      No current facility-administered medications for this encounter.   Current Outpatient Medications  Medication Sig Dispense Refill Last Dose  . b complex vitamins tablet Take 1 tablet by mouth daily.     . Calcium Citrate-Vitamin D (CALCIUM CITRATE + D PO) Take 2 tablets by mouth at bedtime. Mag     . cycloSPORINE (RESTASIS) 0.05 % ophthalmic emulsion Place 1 drop into both eyes 2 (two) times daily.      Marland Kitchen estradiol (ESTRACE) 2 MG tablet Take 2 mg by mouth at bedtime.      . finasteride (PROSCAR) 5 MG tablet Take  2.5 tablets by mouth at bedtime.      . hydrochlorothiazide (HYDRODIURIL) 25 MG tablet Take 1 tablet (25 mg total) by mouth daily. 90 tablet 3   . Hypromellose, PF, (RETAINE HPMC) 0.3 % SOLN Place 1 drop into both eyes daily as needed (Dry eyes).      . Multiple Vitamins-Minerals (MULTIVITAMIN WITH MINERALS) tablet Take 1 tablet by mouth daily.     Marland Kitchen olmesartan (BENICAR) 40 MG tablet TAKE 1 TABLET(40 MG) BY MOUTH DAILY (Patient taking  differently: Take 40 mg by mouth daily. ) 90 tablet 1   . Omega-3 Fatty Acids (EQL OMEGA 3 FISH OIL PO) Take 565 mg by mouth daily. Complete     . polyethylene glycol (MIRALAX / GLYCOLAX) packet Take 17 g by mouth 2 (two) times daily. (Patient taking differently: Take 17 g by mouth daily as needed for mild constipation. ) 14 each 0   . rosuvastatin (CRESTOR) 10 MG tablet Take 1 tablet (10 mg total) by mouth at bedtime. 90 tablet 3   . Saline (SIMPLY SALINE) 0.9 % AERS Place 1 spray into both nostrils 2 (two) times daily as needed (congestion).      . ALPRAZolam (XANAX) 0.25 MG tablet TAKE 1 TABLET(0.25 MG) BY MOUTH TWICE DAILY AS NEEDED FOR ANXIETY 60 tablet 0   . amphetamine-dextroamphetamine (ADDERALL XR) 15 MG 24 hr capsule 2 po q am (Patient not taking: Reported on 07/21/2020) 62 capsule 0   . gabapentin (NEURONTIN) 100 MG capsule Take 1 capsule (100 mg total) by mouth 3 (three) times daily. (Patient not taking: Reported on 09/09/2020) 60 capsule 1 Not Taking at Unknown time  . HYDROcodone-acetaminophen (NORCO/VICODIN) 5-325 MG tablet Take 1 tablet by mouth every 6 (six) hours as needed for moderate pain. 40 tablet 0   . traMADol (ULTRAM) 50 MG tablet Take 1 tablet (50 mg total) by mouth 2 (two) times daily. (Patient not taking: Reported on 09/09/2020) 60 tablet 0 Not Taking at Unknown time  . valACYclovir (VALTREX) 1000 MG tablet Take 1 tablet (1,000 mg total) by mouth 2 (two) times daily. (Patient not taking: Reported on 07/21/2020) 14 tablet 0   . zolpidem (AMBIEN) 10 MG tablet TAKE 1 TABLET(10 MG) BY MOUTH AT BEDTIME AS NEEDED FOR SLEEP 30 tablet 0    No Known Allergies  Social History   Tobacco Use  . Smoking status: Former Smoker    Packs/day: 0.25    Types: Cigarettes    Quit date: 11/27/2005    Years since quitting: 14.8  . Smokeless tobacco: Never Used  Substance Use Topics  . Alcohol use: Yes    Alcohol/week: 7.0 standard drinks    Types: 7 Shots of liquor per week    Comment:  scotch daily    Family History  Problem Relation Age of Onset  . Heart failure Mother   . Hypertension Father   . Cancer Father   . Stroke Father   . Heart disease Brother   . Alcoholism Brother      Review of Systems  Musculoskeletal: Positive for gait problem.  All other systems reviewed and are negative.   Objective:  Physical Exam Vitals reviewed.  Constitutional:      Appearance: Normal appearance.  HENT:     Head: Normocephalic and atraumatic.  Eyes:     Extraocular Movements: Extraocular movements intact.     Pupils: Pupils are equal, round, and reactive to light.  Cardiovascular:     Rate and Rhythm: Normal rate  and regular rhythm.     Pulses: Normal pulses.  Pulmonary:     Effort: Pulmonary effort is normal.     Breath sounds: Normal breath sounds.  Abdominal:     Palpations: Abdomen is soft.  Musculoskeletal:     Cervical back: Normal range of motion.     Right hip: Tenderness and bony tenderness present. Decreased range of motion. Decreased strength.  Neurological:     Mental Status: She is alert and oriented to person, place, and time.  Psychiatric:        Behavior: Behavior normal.     Vital signs in last 24 hours:    Labs:   Estimated body mass index is 23.34 kg/m as calculated from the following:   Height as of 09/15/20: 5\' 4"  (1.626 m).   Weight as of 09/15/20: 61.7 kg.   Imaging Review Plain radiographs demonstrate severe degenerative joint disease of the right hip(s). The bone quality appears to be good for age and reported activity level. There is retained orthopedic hardware.     Assessment/Plan:  End stage arthritis, right hip(s) with retained orthopedic hardware  The patient history, physical examination, clinical judgement of the provider and imaging studies are consistent with end stage degenerative joint disease of the right hip(s) and total hip arthroplasty is deemed medically necessary. The treatment options including  medical management, injection therapy, arthroscopy and arthroplasty were discussed at length. The risks and benefits of total hip arthroplasty were presented and reviewed. The risks due to aseptic loosening, infection, stiffness, dislocation/subluxation,  thromboembolic complications and other imponderables were discussed.  The patient acknowledged the explanation, agreed to proceed with the plan and consent was signed. Patient is being admitted for inpatient treatment for surgery, pain control, PT, OT, prophylactic antibiotics, VTE prophylaxis, progressive ambulation and ADL's and discharge planning.The patient is planning to be discharged home with home health services

## 2020-09-23 NOTE — Telephone Encounter (Signed)
Ortho bundle pre-op call completed. 

## 2020-09-23 NOTE — Care Plan (Signed)
RNCM call to patient to discuss her upcoming Right hardware removal with total hip replacement surgery on 09/24/20 with Dr. Ninfa Linden. Patient is an Ortho bundle patient through [facility] and is agreeable to CM. She has a daughter and friend that will be assisting after surgery. Referral to HHPT, [facility] made after choice provided. She has all DME needed (FWW, 3in1/BSC). No DME referral made. Reviewed all post-op instructions. Will continue to monitor for needs.

## 2020-09-24 ENCOUNTER — Ambulatory Visit (HOSPITAL_COMMUNITY): Payer: Medicare Other | Admitting: Anesthesiology

## 2020-09-24 ENCOUNTER — Ambulatory Visit (HOSPITAL_COMMUNITY): Payer: Medicare Other

## 2020-09-24 ENCOUNTER — Observation Stay (HOSPITAL_COMMUNITY): Payer: Medicare Other

## 2020-09-24 ENCOUNTER — Encounter (HOSPITAL_COMMUNITY)
Admission: RE | Disposition: A | Discharge status: Home / Self Care | Payer: Self-pay | Source: Home / Self Care | Attending: Orthopaedic Surgery

## 2020-09-24 ENCOUNTER — Inpatient Hospital Stay (HOSPITAL_COMMUNITY)
Admission: RE | Admit: 2020-09-24 | Discharge: 2020-09-26 | DRG: 470 | Disposition: A | Discharge status: Home / Self Care | Payer: Medicare Other | Attending: Orthopaedic Surgery | Admitting: Orthopaedic Surgery

## 2020-09-24 ENCOUNTER — Other Ambulatory Visit: Payer: Self-pay

## 2020-09-24 ENCOUNTER — Encounter (HOSPITAL_COMMUNITY): Payer: Self-pay | Admitting: Orthopaedic Surgery

## 2020-09-24 DIAGNOSIS — E785 Hyperlipidemia, unspecified: Secondary | ICD-10-CM | POA: Diagnosis not present

## 2020-09-24 DIAGNOSIS — E7800 Pure hypercholesterolemia, unspecified: Secondary | ICD-10-CM | POA: Diagnosis present

## 2020-09-24 DIAGNOSIS — Z79899 Other long term (current) drug therapy: Secondary | ICD-10-CM

## 2020-09-24 DIAGNOSIS — Z90710 Acquired absence of both cervix and uterus: Secondary | ICD-10-CM

## 2020-09-24 DIAGNOSIS — E039 Hypothyroidism, unspecified: Secondary | ICD-10-CM | POA: Diagnosis not present

## 2020-09-24 DIAGNOSIS — I10 Essential (primary) hypertension: Secondary | ICD-10-CM | POA: Diagnosis present

## 2020-09-24 DIAGNOSIS — Z419 Encounter for procedure for purposes other than remedying health state, unspecified: Secondary | ICD-10-CM

## 2020-09-24 DIAGNOSIS — Z20822 Contact with and (suspected) exposure to covid-19: Secondary | ICD-10-CM | POA: Diagnosis not present

## 2020-09-24 DIAGNOSIS — M879 Osteonecrosis, unspecified: Secondary | ICD-10-CM | POA: Diagnosis not present

## 2020-09-24 DIAGNOSIS — Z96641 Presence of right artificial hip joint: Secondary | ICD-10-CM

## 2020-09-24 DIAGNOSIS — Z87891 Personal history of nicotine dependence: Secondary | ICD-10-CM | POA: Diagnosis not present

## 2020-09-24 DIAGNOSIS — M1611 Unilateral primary osteoarthritis, right hip: Secondary | ICD-10-CM | POA: Diagnosis not present

## 2020-09-24 DIAGNOSIS — D62 Acute posthemorrhagic anemia: Secondary | ICD-10-CM | POA: Diagnosis not present

## 2020-09-24 DIAGNOSIS — Z471 Aftercare following joint replacement surgery: Secondary | ICD-10-CM | POA: Diagnosis not present

## 2020-09-24 HISTORY — PX: TOTAL HIP ARTHROPLASTY: SHX124

## 2020-09-24 HISTORY — PX: HARDWARE REMOVAL: SHX979

## 2020-09-24 LAB — TYPE AND SCREEN
ABO/RH(D): O NEG
Antibody Screen: NEGATIVE

## 2020-09-24 SURGERY — REMOVAL, HARDWARE
Anesthesia: Spinal | Site: Hip | Laterality: Right

## 2020-09-24 MED ORDER — TRANEXAMIC ACID-NACL 1000-0.7 MG/100ML-% IV SOLN
1000.0000 mg | INTRAVENOUS | Status: AC
  Administered 2020-09-24: 1000 mg via INTRAVENOUS
  Filled 2020-09-24: qty 100

## 2020-09-24 MED ORDER — CHLORHEXIDINE GLUCONATE 0.12 % MT SOLN
15.0000 mL | Freq: Once | OROMUCOSAL | Status: AC
  Administered 2020-09-24: 15 mL via OROMUCOSAL

## 2020-09-24 MED ORDER — 0.9 % SODIUM CHLORIDE (POUR BTL) OPTIME
TOPICAL | Status: DC | PRN
  Administered 2020-09-24: 1000 mL

## 2020-09-24 MED ORDER — LACTATED RINGERS IV SOLN: INTRAVENOUS | Status: DC

## 2020-09-24 MED ORDER — HYDROMORPHONE HCL 1 MG/ML IJ SOLN
0.5000 mg | INTRAMUSCULAR | Status: DC | PRN
  Administered 2020-09-24 – 2020-09-25 (×2): 0.5 mg via INTRAVENOUS
  Administered 2020-09-26 (×2): 1 mg via INTRAVENOUS
  Filled 2020-09-24 (×4): qty 1

## 2020-09-24 MED ORDER — METOCLOPRAMIDE HCL 5 MG/ML IJ SOLN: 5.0000 mg | Freq: Three times a day (TID) | INTRAMUSCULAR | Status: DC | PRN

## 2020-09-24 MED ORDER — OXYCODONE HCL 5 MG PO TABS
5.0000 mg | ORAL_TABLET | ORAL | Status: DC | PRN
  Administered 2020-09-25 – 2020-09-26 (×3): 10 mg via ORAL
  Filled 2020-09-24 (×4): qty 2

## 2020-09-24 MED ORDER — ACETAMINOPHEN 325 MG PO TABS
325.0000 mg | ORAL_TABLET | Freq: Four times a day (QID) | ORAL | Status: DC | PRN
  Administered 2020-09-25 (×2): 650 mg via ORAL
  Filled 2020-09-24 (×2): qty 2

## 2020-09-24 MED ORDER — INFLUENZA VAC A&B SA ADJ QUAD 0.5 ML IM PRSY
0.5000 mL | PREFILLED_SYRINGE | INTRAMUSCULAR | Status: DC | PRN
  Filled 2020-09-24: qty 0.5

## 2020-09-24 MED ORDER — POVIDONE-IODINE 10 % EX SWAB
2.0000 "application " | Freq: Once | CUTANEOUS | Status: AC
  Administered 2020-09-24: 2 via TOPICAL

## 2020-09-24 MED ORDER — ALPRAZOLAM 0.25 MG PO TABS
0.2500 mg | ORAL_TABLET | Freq: Two times a day (BID) | ORAL | Status: DC | PRN
  Administered 2020-09-24 – 2020-09-26 (×4): 0.25 mg via ORAL
  Filled 2020-09-24 (×4): qty 1

## 2020-09-24 MED ORDER — OXYCODONE HCL 5 MG PO TABS
10.0000 mg | ORAL_TABLET | ORAL | Status: DC | PRN
  Administered 2020-09-24 – 2020-09-25 (×3): 10 mg via ORAL
  Administered 2020-09-25: 15 mg via ORAL
  Administered 2020-09-25 – 2020-09-26 (×2): 10 mg via ORAL
  Filled 2020-09-24: qty 3
  Filled 2020-09-24 (×2): qty 2
  Filled 2020-09-24 (×2): qty 3

## 2020-09-24 MED ORDER — HYDROCHLOROTHIAZIDE 25 MG PO TABS
25.0000 mg | ORAL_TABLET | Freq: Every day | ORAL | Status: DC
  Administered 2020-09-25 – 2020-09-26 (×2): 25 mg via ORAL
  Filled 2020-09-24 (×2): qty 1

## 2020-09-24 MED ORDER — B COMPLEX-C PO TABS
1.0000 | ORAL_TABLET | Freq: Every day | ORAL | Status: DC
  Administered 2020-09-25 – 2020-09-26 (×2): 1 via ORAL
  Filled 2020-09-24 (×3): qty 1

## 2020-09-24 MED ORDER — ALUM & MAG HYDROXIDE-SIMETH 200-200-20 MG/5ML PO SUSP: 30.0000 mL | ORAL | Status: DC | PRN

## 2020-09-24 MED ORDER — ONDANSETRON HCL 4 MG PO TABS
4.0000 mg | ORAL_TABLET | Freq: Four times a day (QID) | ORAL | Status: DC | PRN
  Filled 2020-09-24: qty 1

## 2020-09-24 MED ORDER — CEFAZOLIN SODIUM-DEXTROSE 2-4 GM/100ML-% IV SOLN
2.0000 g | INTRAVENOUS | Status: AC
  Administered 2020-09-24: 2 g via INTRAVENOUS
  Filled 2020-09-24: qty 100

## 2020-09-24 MED ORDER — BUPIVACAINE IN DEXTROSE 0.75-8.25 % IT SOLN
INTRATHECAL | Status: DC | PRN
  Administered 2020-09-24: 1.6 mL via INTRATHECAL

## 2020-09-24 MED ORDER — METHOCARBAMOL 500 MG PO TABS
500.0000 mg | ORAL_TABLET | Freq: Four times a day (QID) | ORAL | Status: DC | PRN
  Administered 2020-09-25 – 2020-09-26 (×4): 500 mg via ORAL
  Filled 2020-09-24 (×4): qty 1

## 2020-09-24 MED ORDER — ESTRADIOL 1 MG PO TABS
2.0000 mg | ORAL_TABLET | Freq: Every day | ORAL | Status: DC
  Administered 2020-09-24 – 2020-09-25 (×2): 2 mg via ORAL
  Filled 2020-09-24 (×2): qty 2

## 2020-09-24 MED ORDER — ONDANSETRON HCL 4 MG/2ML IJ SOLN
4.0000 mg | Freq: Once | INTRAMUSCULAR | Status: AC | PRN
  Administered 2020-09-24: 4 mg via INTRAVENOUS

## 2020-09-24 MED ORDER — FENTANYL CITRATE (PF) 100 MCG/2ML IJ SOLN
INTRAMUSCULAR | Status: DC | PRN
  Administered 2020-09-24 (×2): 50 ug via INTRAVENOUS

## 2020-09-24 MED ORDER — PANTOPRAZOLE SODIUM 40 MG PO TBEC
40.0000 mg | DELAYED_RELEASE_TABLET | Freq: Every day | ORAL | Status: DC
  Administered 2020-09-26: 40 mg via ORAL
  Filled 2020-09-24: qty 1

## 2020-09-24 MED ORDER — PHENOL 1.4 % MT LIQD: 1.0000 | OROMUCOSAL | Status: DC | PRN

## 2020-09-24 MED ORDER — PHENYLEPHRINE 40 MCG/ML (10ML) SYRINGE FOR IV PUSH (FOR BLOOD PRESSURE SUPPORT)
PREFILLED_SYRINGE | INTRAVENOUS | Status: DC | PRN
  Administered 2020-09-24 (×3): 80 ug via INTRAVENOUS

## 2020-09-24 MED ORDER — IRBESARTAN 150 MG PO TABS
300.0000 mg | ORAL_TABLET | Freq: Every day | ORAL | Status: DC
  Administered 2020-09-25 – 2020-09-26 (×2): 300 mg via ORAL
  Filled 2020-09-24 (×2): qty 2

## 2020-09-24 MED ORDER — MIDAZOLAM HCL 2 MG/2ML IJ SOLN
INTRAMUSCULAR | Status: DC | PRN
  Administered 2020-09-24: 2 mg via INTRAVENOUS

## 2020-09-24 MED ORDER — ASPIRIN 81 MG PO CHEW
81.0000 mg | CHEWABLE_TABLET | Freq: Two times a day (BID) | ORAL | Status: DC
  Administered 2020-09-24 – 2020-09-26 (×4): 81 mg via ORAL
  Filled 2020-09-24 (×4): qty 1

## 2020-09-24 MED ORDER — OXYCODONE HCL 5 MG PO TABS: 5.0000 mg | ORAL_TABLET | Freq: Once | ORAL | Status: DC | PRN

## 2020-09-24 MED ORDER — PHENYLEPHRINE 40 MCG/ML (10ML) SYRINGE FOR IV PUSH (FOR BLOOD PRESSURE SUPPORT)
PREFILLED_SYRINGE | INTRAVENOUS | Status: AC
  Filled 2020-09-24: qty 30

## 2020-09-24 MED ORDER — SODIUM CHLORIDE 0.9 % IR SOLN
Status: DC | PRN
  Administered 2020-09-24: 1000 mL

## 2020-09-24 MED ORDER — PHENYLEPHRINE HCL (PRESSORS) 10 MG/ML IV SOLN
INTRAVENOUS | Status: AC
  Filled 2020-09-24: qty 1

## 2020-09-24 MED ORDER — POLYETHYLENE GLYCOL 3350 17 G PO PACK: 17.0000 g | PACK | Freq: Every day | ORAL | Status: DC | PRN

## 2020-09-24 MED ORDER — GABAPENTIN 100 MG PO CAPS
100.0000 mg | ORAL_CAPSULE | Freq: Three times a day (TID) | ORAL | Status: DC
  Administered 2020-09-24 – 2020-09-26 (×5): 100 mg via ORAL
  Filled 2020-09-24 (×5): qty 1

## 2020-09-24 MED ORDER — METHOCARBAMOL 500 MG IVPB - SIMPLE MED
500.0000 mg | Freq: Four times a day (QID) | INTRAVENOUS | Status: DC | PRN
  Administered 2020-09-24: 500 mg via INTRAVENOUS
  Filled 2020-09-24: qty 50

## 2020-09-24 MED ORDER — SODIUM CHLORIDE 0.9 % IV SOLN: INTRAVENOUS | Status: DC

## 2020-09-24 MED ORDER — MENTHOL 3 MG MT LOZG: 1.0000 | LOZENGE | OROMUCOSAL | Status: DC | PRN

## 2020-09-24 MED ORDER — ORAL CARE MOUTH RINSE: 15.0000 mL | Freq: Once | OROMUCOSAL | Status: AC

## 2020-09-24 MED ORDER — CYCLOSPORINE 0.05 % OP EMUL
1.0000 [drp] | Freq: Two times a day (BID) | OPHTHALMIC | Status: DC
  Administered 2020-09-24 – 2020-09-26 (×3): 1 [drp] via OPHTHALMIC
  Filled 2020-09-24 (×4): qty 1

## 2020-09-24 MED ORDER — METHOCARBAMOL 500 MG IVPB - SIMPLE MED
INTRAVENOUS | Status: AC
  Filled 2020-09-24: qty 50

## 2020-09-24 MED ORDER — MIDAZOLAM HCL 2 MG/2ML IJ SOLN
INTRAMUSCULAR | Status: AC
  Filled 2020-09-24: qty 2

## 2020-09-24 MED ORDER — LIDOCAINE 2% (20 MG/ML) 5 ML SYRINGE
INTRAMUSCULAR | Status: DC | PRN
  Administered 2020-09-24: 60 mg via INTRAVENOUS

## 2020-09-24 MED ORDER — DOCUSATE SODIUM 100 MG PO CAPS
100.0000 mg | ORAL_CAPSULE | Freq: Two times a day (BID) | ORAL | Status: DC
  Administered 2020-09-24 – 2020-09-26 (×4): 100 mg via ORAL
  Filled 2020-09-24 (×4): qty 1

## 2020-09-24 MED ORDER — DIPHENHYDRAMINE HCL 12.5 MG/5ML PO ELIX: 12.5000 mg | ORAL_SOLUTION | ORAL | Status: DC | PRN

## 2020-09-24 MED ORDER — STERILE WATER FOR IRRIGATION IR SOLN
Status: DC | PRN
  Administered 2020-09-24: 2000 mL

## 2020-09-24 MED ORDER — OXYCODONE HCL 5 MG/5ML PO SOLN: 5.0000 mg | Freq: Once | ORAL | Status: DC | PRN

## 2020-09-24 MED ORDER — SALINE SPRAY 0.65 % NA SOLN
1.0000 | Freq: Two times a day (BID) | NASAL | Status: DC | PRN
  Filled 2020-09-24: qty 44

## 2020-09-24 MED ORDER — ONDANSETRON HCL 4 MG/2ML IJ SOLN: 4.0000 mg | Freq: Four times a day (QID) | INTRAMUSCULAR | Status: DC | PRN

## 2020-09-24 MED ORDER — DEXAMETHASONE SODIUM PHOSPHATE 10 MG/ML IJ SOLN
INTRAMUSCULAR | Status: DC | PRN
  Administered 2020-09-24: 10 mg via INTRAVENOUS

## 2020-09-24 MED ORDER — FENTANYL CITRATE (PF) 100 MCG/2ML IJ SOLN
INTRAMUSCULAR | Status: AC
  Administered 2020-09-24: 50 ug via INTRAVENOUS
  Filled 2020-09-24: qty 2

## 2020-09-24 MED ORDER — FENTANYL CITRATE (PF) 100 MCG/2ML IJ SOLN
25.0000 ug | INTRAMUSCULAR | Status: DC | PRN
  Administered 2020-09-24: 25 ug via INTRAVENOUS

## 2020-09-24 MED ORDER — FINASTERIDE 5 MG PO TABS
12.5000 mg | ORAL_TABLET | Freq: Every day | ORAL | Status: DC
  Administered 2020-09-24: 12.5 mg via ORAL
  Filled 2020-09-24 (×2): qty 2.5

## 2020-09-24 MED ORDER — ADULT MULTIVITAMIN W/MINERALS CH
1.0000 | ORAL_TABLET | Freq: Every day | ORAL | Status: DC
  Administered 2020-09-25 – 2020-09-26 (×2): 1 via ORAL
  Filled 2020-09-24: qty 1

## 2020-09-24 MED ORDER — METOCLOPRAMIDE HCL 5 MG PO TABS: 5.0000 mg | ORAL_TABLET | Freq: Three times a day (TID) | ORAL | Status: DC | PRN

## 2020-09-24 MED ORDER — PHENYLEPHRINE HCL-NACL 10-0.9 MG/250ML-% IV SOLN
INTRAVENOUS | Status: DC | PRN
  Administered 2020-09-24: 20 ug/min via INTRAVENOUS

## 2020-09-24 MED ORDER — EPHEDRINE SULFATE-NACL 50-0.9 MG/10ML-% IV SOSY
PREFILLED_SYRINGE | INTRAVENOUS | Status: DC | PRN
  Administered 2020-09-24: 10 mg via INTRAVENOUS

## 2020-09-24 MED ORDER — ZOLPIDEM TARTRATE 5 MG PO TABS
5.0000 mg | ORAL_TABLET | Freq: Every evening | ORAL | Status: DC | PRN
  Administered 2020-09-24 – 2020-09-25 (×2): 5 mg via ORAL
  Filled 2020-09-24 (×2): qty 1

## 2020-09-24 MED ORDER — PROPOFOL 500 MG/50ML IV EMUL
INTRAVENOUS | Status: DC | PRN
  Administered 2020-09-24: 75 ug/kg/min via INTRAVENOUS

## 2020-09-24 MED ORDER — ROSUVASTATIN CALCIUM 10 MG PO TABS
10.0000 mg | ORAL_TABLET | Freq: Every day | ORAL | Status: DC
  Administered 2020-09-24 – 2020-09-25 (×2): 10 mg via ORAL
  Filled 2020-09-24 (×2): qty 1

## 2020-09-24 MED ORDER — CEFAZOLIN SODIUM-DEXTROSE 1-4 GM/50ML-% IV SOLN
1.0000 g | Freq: Four times a day (QID) | INTRAVENOUS | Status: AC
  Administered 2020-09-24 – 2020-09-25 (×2): 1 g via INTRAVENOUS
  Filled 2020-09-24 (×2): qty 50

## 2020-09-24 MED ORDER — FENTANYL CITRATE (PF) 100 MCG/2ML IJ SOLN
INTRAMUSCULAR | Status: AC
  Filled 2020-09-24: qty 2

## 2020-09-24 SURGICAL SUPPLY — 67 items
APL SKNCLS STERI-STRIP NONHPOA (GAUZE/BANDAGES/DRESSINGS)
BAG SPEC THK2 15X12 ZIP CLS (MISCELLANEOUS) ×2
BAG ZIPLOCK 12X15 (MISCELLANEOUS) ×4 IMPLANT
BANDAGE ESMARK 6X9 LF (GAUZE/BANDAGES/DRESSINGS) ×2 IMPLANT
BENZOIN TINCTURE PRP APPL 2/3 (GAUZE/BANDAGES/DRESSINGS) IMPLANT
BLADE SAW SGTL 18X1.27X75 (BLADE) ×3 IMPLANT
BLADE SAW SGTL 18X1.27X75MM (BLADE) ×1
BLADE SURG SZ10 CARB STEEL (BLADE) ×2 IMPLANT
BNDG CMPR 9X6 STRL LF SNTH (GAUZE/BANDAGES/DRESSINGS) ×2
BNDG ELASTIC 6X5.8 VLCR STR LF (GAUZE/BANDAGES/DRESSINGS) ×4 IMPLANT
BNDG ESMARK 6X9 LF (GAUZE/BANDAGES/DRESSINGS) ×4
CLOSURE WOUND 1/2 X4 (GAUZE/BANDAGES/DRESSINGS)
COVER PERINEAL POST (MISCELLANEOUS) ×4 IMPLANT
COVER SURGICAL LIGHT HANDLE (MISCELLANEOUS) ×4 IMPLANT
COVER WAND RF STERILE (DRAPES) ×4 IMPLANT
CUFF TOURN SGL QUICK 34 (TOURNIQUET CUFF) ×4
CUFF TRNQT CYL 34X4.125X (TOURNIQUET CUFF) ×2 IMPLANT
CUP SECTOR GRIPTON 50MM (Cup) ×2 IMPLANT
DRAPE POUCH INSTRU U-SHP 10X18 (DRAPES) ×4 IMPLANT
DRAPE STERI IOBAN 125X83 (DRAPES) ×4 IMPLANT
DRAPE U-SHAPE 47X51 STRL (DRAPES) ×8 IMPLANT
DRSG AQUACEL AG ADV 3.5X10 (GAUZE/BANDAGES/DRESSINGS) ×6 IMPLANT
DRSG PAD ABDOMINAL 8X10 ST (GAUZE/BANDAGES/DRESSINGS) ×4 IMPLANT
DURAPREP 26ML APPLICATOR (WOUND CARE) ×4 IMPLANT
ELECT REM PT RETURN 15FT ADLT (MISCELLANEOUS) ×4 IMPLANT
GAUZE SPONGE 4X4 12PLY STRL (GAUZE/BANDAGES/DRESSINGS) ×4 IMPLANT
GAUZE XEROFORM 1X8 LF (GAUZE/BANDAGES/DRESSINGS) ×4 IMPLANT
GAUZE XEROFORM 5X9 LF (GAUZE/BANDAGES/DRESSINGS) ×2 IMPLANT
GLOVE BIO SURGEON STRL SZ7.5 (GLOVE) ×4 IMPLANT
GLOVE BIOGEL PI IND STRL 8 (GLOVE) ×4 IMPLANT
GLOVE BIOGEL PI INDICATOR 8 (GLOVE) ×4
GLOVE ECLIPSE 8.0 STRL XLNG CF (GLOVE) ×4 IMPLANT
GOWN STRL REUS W/TWL XL LVL3 (GOWN DISPOSABLE) ×8 IMPLANT
GUIDEROD T2 3X1000 (ROD) ×2 IMPLANT
HANDPIECE INTERPULSE COAX TIP (DISPOSABLE) ×4
HEAD FEM STD 32X+5 STRL (Hips) ×2 IMPLANT
HOLDER FOLEY CATH W/STRAP (MISCELLANEOUS) ×4 IMPLANT
K-WIRE  3.2X450M STR (WIRE) ×4
K-WIRE 3.2X450M STR (WIRE) ×2
KIT BASIN OR (CUSTOM PROCEDURE TRAY) ×4 IMPLANT
KIT TURNOVER KIT A (KITS) IMPLANT
KWIRE 3.2X450M STR (WIRE) IMPLANT
LINER ACET PNNCL PLUS4 NEUTRAL (Hips) IMPLANT
NS IRRIG 1000ML POUR BTL (IV SOLUTION) ×4 IMPLANT
PACK ANTERIOR HIP CUSTOM (KITS) ×4 IMPLANT
PACK ORTHO EXTREMITY (CUSTOM PROCEDURE TRAY) ×4 IMPLANT
PADDING CAST COTTON 6X4 STRL (CAST SUPPLIES) ×4 IMPLANT
PENCIL SMOKE EVACUATOR (MISCELLANEOUS) ×2 IMPLANT
PINNACLE PLUS 4 NEUTRAL (Hips) ×4 IMPLANT
PROTECTOR NERVE ULNAR (MISCELLANEOUS) ×4 IMPLANT
REAMER SHAFT BIXCUT (INSTRUMENTS) ×2 IMPLANT
SET HNDPC FAN SPRY TIP SCT (DISPOSABLE) ×2 IMPLANT
SPONGE LAP 18X18 RF (DISPOSABLE) ×2 IMPLANT
STAPLER VISISTAT 35W (STAPLE) ×4 IMPLANT
STEM CORAIL KA11 (Stem) ×2 IMPLANT
STRIP CLOSURE SKIN 1/2X4 (GAUZE/BANDAGES/DRESSINGS) IMPLANT
SUT ETHIBOND NAB CT1 #1 30IN (SUTURE) ×4 IMPLANT
SUT ETHILON 2 0 PS N (SUTURE) IMPLANT
SUT MNCRL AB 4-0 PS2 18 (SUTURE) IMPLANT
SUT VIC AB 0 CT1 36 (SUTURE) ×6 IMPLANT
SUT VIC AB 1 CT1 27 (SUTURE) ×4
SUT VIC AB 1 CT1 27XBRD ANTBC (SUTURE) ×2 IMPLANT
SUT VIC AB 1 CT1 36 (SUTURE) ×4 IMPLANT
SUT VIC AB 2-0 CT1 27 (SUTURE) ×8
SUT VIC AB 2-0 CT1 TAPERPNT 27 (SUTURE) ×4 IMPLANT
TOWEL OR 17X26 10 PK STRL BLUE (TOWEL DISPOSABLE) ×8 IMPLANT
TRAY FOLEY MTR SLVR 14FR STAT (SET/KITS/TRAYS/PACK) ×2 IMPLANT

## 2020-09-24 NOTE — Anesthesia Preprocedure Evaluation (Addendum)
Anesthesia Evaluation  Patient identified by MRN, date of birth, ID band Patient awake    Reviewed: Allergy & Precautions, NPO status , Patient's Chart, lab work & pertinent test results  History of Anesthesia Complications Negative for: history of anesthetic complications  Airway Mallampati: II  TM Distance: >3 FB Neck ROM: Full    Dental  (+) Dental Advisory Given   Pulmonary former smoker,    Pulmonary exam normal        Cardiovascular hypertension, Pt. on medications Normal cardiovascular exam   '19 Myoperfusion - The left ventricular ejection fraction is hyperdynamic (>65%). Nuclear stress EF: 67%. No wall motion abnormality. There was no ST segment deviation noted during stress. This is a low risk study. No ischemia identified. The study is normal.     Neuro/Psych PSYCHIATRIC DISORDERS Anxiety negative neurological ROS     GI/Hepatic negative GI ROS, Neg liver ROS,   Endo/Other  Hypothyroidism (formerly on synthroid, no mesd x 10 yrs)   Renal/GU negative Renal ROS     Musculoskeletal  (+) Arthritis ,   Abdominal   Peds  (+) ATTENTION DEFICIT DISORDER WITHOUT HYPERACTIVITY Hematology  Thrombocytopenia, Plt 139k    Anesthesia Other Findings Covid test negative   Reproductive/Obstetrics                            Anesthesia Physical Anesthesia Plan  ASA: II  Anesthesia Plan: Spinal   Post-op Pain Management:    Induction:   PONV Risk Score and Plan: 2 and Treatment may vary due to age or medical condition and Propofol infusion  Airway Management Planned: Natural Airway and Simple Face Mask  Additional Equipment: None  Intra-op Plan:   Post-operative Plan:   Informed Consent: I have reviewed the patients History and Physical, chart, labs and discussed the procedure including the risks, benefits and alternatives for the proposed anesthesia with the patient or  authorized representative who has indicated his/her understanding and acceptance.       Plan Discussed with: CRNA and Anesthesiologist  Anesthesia Plan Comments: (Labs reviewed, platelets acceptable. Discussed risks and benefits of spinal, including spinal/epidural hematoma, infection, failed block, and PDPH. Patient expressed understanding and wished to proceed. )       Anesthesia Quick Evaluation

## 2020-09-24 NOTE — Progress Notes (Signed)
Orthopedic Tech Progress Note Patient Details:  Jeanne Smith 1950-12-19 [id number]  Ortho Devices Ortho Device/Splint Location: applied overhead frame to bed Ortho Device/Splint Interventions: Ordered, Application   Post Interventions Patient Tolerated: Well Instructions Provided: Care of device   Braulio Bosch 09/24/2020, 6:29 PM

## 2020-09-24 NOTE — Care Plan (Signed)
Ortho Bundle Case Management Note  Patient Details  Name: Jeanne Smith MRN: [id number] Date of Birth: 1951/09/11    [facility] call to patient to discuss her upcoming Right hardware removal with total hip replacement surgery on 09/24/20 with Dr. Ninfa Linden. Patient is an Ortho bundle patient through [facility] and is agreeable to CM. She has a daughter and friend that will be assisting after surgery. Referral to HHPT, [facility] made after choice provided. She has all DME needed (FWW, 3in1/BSC). No DME referral made. Reviewed all post-op instructions. Will continue to monitor for needs.               DME Arranged:   (Patient reports she has FWW and 3in1/BSC at home already) DME Agency:     [place]:  PT [place]:  [facility] (now [facility])  Additional Comments: Please contact me with any questions of if this plan should need to change.  Jamse Arn, RN, BSN, [employer]  [phone] 09/24/2020, 10:47 AM

## 2020-09-24 NOTE — Brief Op Note (Signed)
09/24/2020  3:31 PM  PATIENT:  Jeanne Smith  69 y.o. female  PRE-OPERATIVE DIAGNOSIS:  Osteoarthritis Right Hip, Retained Hardware Right Hip  POST-OPERATIVE DIAGNOSIS:  Osteoarthritis Right Hip, Retained Hardware Right Hip  PROCEDURE:  Procedure(s): REMOVAL OF IM ROD/HIP SCREW RIGHT HIP (Right) RIGHT TOTAL HIP ARTHROPLASTY ANTERIOR APPROACH (Right)  SURGEON:  Surgeon(s) and Role:    Mcarthur Rossetti, MD - Primary  PHYSICIAN ASSISTANT:  Benita Stabile, PA-C  ANESTHESIA:   spinal  EBL:  200 mL   DICTATION: .Other Dictation: Dictation Number [id number]  PLAN OF CARE: Admit to inpatient   PATIENT DISPOSITION:  PACU - hemodynamically stable.   Delay start of Pharmacological VTE agent (>24hrs) due to surgical blood loss or risk of bleeding: no

## 2020-09-24 NOTE — Anesthesia Postprocedure Evaluation (Signed)
Anesthesia Post Note  Patient: Jeanne Smith  Procedure(s) Performed: REMOVAL OF IM ROD/HIP SCREW RIGHT HIP (Right ) RIGHT TOTAL HIP ARTHROPLASTY ANTERIOR APPROACH (Right Hip)     Patient location during evaluation: PACU Anesthesia Type: Spinal Level of consciousness: awake and alert Pain management: pain level controlled Vital Signs Assessment: post-procedure vital signs reviewed and stable Respiratory status: spontaneous breathing and respiratory function stable Cardiovascular status: blood pressure returned to baseline and stable Postop Assessment: spinal receding and no apparent nausea or vomiting Anesthetic complications: no   No complications documented.  Last Vitals:  Vitals:   09/24/20 1610 09/24/20 1615  BP: 114/65 107/69  Pulse:  83  Resp:  16  Temp:    SpO2: 99% 100%    Last Pain:  Vitals:   09/24/20 1615  TempSrc:   PainSc: 0-No pain                 Audry Pili

## 2020-09-24 NOTE — Transfer of Care (Signed)
Immediate Anesthesia Transfer of Care Note  Patient: Jeanne Smith  Procedure(s) Performed: Procedure(s): REMOVAL OF IM ROD/HIP SCREW RIGHT HIP (Right) RIGHT TOTAL HIP ARTHROPLASTY ANTERIOR APPROACH (Right)  Patient Location: PACU  Anesthesia Type:Spinal  Level of Consciousness: awake, alert  and oriented  Airway & Oxygen Therapy: Patient Spontanous Breathing  Post-op Assessment: Report given to RN and Post -op Vital signs reviewed and stable  Post vital signs: Reviewed and stable  Last Vitals:  Vitals:   09/24/20 1025  BP: 131/77  Pulse: (!) 111  Resp: 18  Temp: 36.8 C  SpO2: 67%    Complications: No apparent anesthesia complications

## 2020-09-24 NOTE — Anesthesia Procedure Notes (Signed)
Spinal  Patient location during procedure: OR Start time: 09/24/2020 1:20 PM End time: 09/24/2020 1:26 PM Staffing Performed: anesthesiologist  Anesthesiologist: Audry Pili, MD Preanesthetic Checklist Completed: patient identified, IV checked, risks and benefits discussed, surgical consent, monitors and equipment checked, pre-op evaluation and timeout performed Spinal Block Patient position: sitting Prep: DuraPrep Patient monitoring: heart rate, cardiac monitor, continuous pulse ox and blood pressure Approach: midline Location: L3-4 Injection technique: single-shot Needle Needle type: Pencan  Needle gauge: 24 G Additional Notes Consent was obtained prior to the procedure with all questions answered and concerns addressed. Risks including, but not limited to, bleeding, infection, nerve damage, paralysis, failed block, inadequate analgesia, allergic reaction, high spinal, itching, and headache were discussed and the patient wished to proceed. Functioning IV was confirmed and monitors were applied. Sterile prep and drape, including hand hygiene, mask, and sterile gloves were used. The patient was positioned and the spine was prepped. The skin was anesthetized with lidocaine. First attempt by CRNA unsuccessful. Next attempt by Dr. Fransisco Beau successful. Free flow of clear CSF was obtained prior to injecting local anesthetic into the CSF. The spinal needle aspirated freely following injection. The needle was carefully withdrawn. The patient tolerated the procedure well.   Renold Don, MD

## 2020-09-24 NOTE — Op Note (Signed)
NAME: Jeanne Smith, Jeanne Smith MEDICAL RECORD ZH:[id number] ACCOUNT [id number] DATE OF BIRTH:[birth year] FACILITY: [facility] LOCATION: [facility] PHYSICIAN:Daziya Redmond Kerry Fort, MD  OPERATIVE REPORT  DATE OF PROCEDURE:  09/24/2020  PREOPERATIVE DIAGNOSES: 1.  Osteoarthritis and degenerative joint disease, right hip. 2.  Retained intramedullary rod and hip screw, right hip from remote intertrochanteric fracture.  POSTOPERATIVE DIAGNOSES:   1.  Osteoarthritis and degenerative joint disease, right hip.   2.  Retained intramedullary rod and hip screw, right hip from remote intertrochanteric fracture.  PROCEDURE: 1.  Removal of deep retained orthopedic hardware, including removal of intramedullary nail and hip screw, right hip. 2.  Right direct anterior total hip arthroplasty.  IMPLANTS:  DePuy Sector Gription acetabular component size 50, size 32+4 neutral polyethylene liner, size 11 Corail femoral component with standard offset, size 32+5 metal hip ball.  SURGEON:  Lind Guest.  Ninfa Linden, MD  ASSISTANT:  Erskine Emery, PA-C.  ANESTHESIA:  Spinal.  ANTIBIOTICS:  Two g IV Ancef.  ESTIMATED BLOOD LOSS:  200 mL.  COMPLICATIONS:  None.  INDICATIONS:  The patient is a 69 year old female who in 2016 sustained a right hip intertrochanteric fracture.  One of my colleagues in town fixed this appropriately with intramedullary rod and hip screw.  She has since healed the fracture, but then  eventually went on to develop severe arthritis in that right hip.  She actually had a left total hip arthroplasty done by another different colleague in town.  At this point, her right hip pain is debilitating.  Her hip x-rays show no joint space left  with collapse of the femoral head.  Her pain is detrimentally affecting her mobility, her quality of life and activities of daily living to the point that she does wish to proceed with hip replacement surgery.  She understands this will be quite  difficult because we  have to remove the hardware from her right femur and right hip before proceeding with a hip replacement itself.  We had a long and thorough discussion about the risk of acute blood loss anemia, nerve and vessel injury, fracture,  infection, DVT, implant failure and dislocation.  We talked about our goals being decreased pain, improved mobility and overall improved quality of life.  DESCRIPTION OF PROCEDURE:  After informed consent was obtained and appropriate right hip was marked, she was brought to the operating room where spinal anesthesia was obtained while she was on a stretcher.  Foley catheter was placed and then traction  boots were placed on both her feet.  She was then placed supine on the Hana fracture table, with the perineal post in place and both legs in line skeletal traction device and no traction applied.  Her right operative hip was prepped and draped with  DuraPrep and sterile drapes.  A time-out was called.  She was identified as correct patient, correct right hip.  We then made an incision just proximal to the greater trochanter and dissected down to the tip of greater trochanter.  We were able to  identify the intramedullary nail and we placed a distraction device on this after removing the set screw.  We then made a separate incision along the lateral thigh so we could remove the lag screw/compression screw.  We did remove this without difficulty  and then was able to back out the femoral nail.  We did this through 2 separate incisions.  We then proceeded with a direct anterior approach to the hip.  We made a separate incision for this.  We  made an incision just inferior and posterior to the  anterior iliac spine and carried this obliquely down the leg.  We dissected down to the tensor fascia lata muscle.  The tensor fascia was then divided longitudinally to proceed with direct anterior approach to the hip.  We identified and cauterized  circumflex vessels and identified the hip  capsule, opened up the hip capsule in an L-type format, finding significant disease around the femoral head and neck.  We placed a Cobra retractor around the medial and lateral femoral neck and made our femoral  neck cut with an oscillating saw and completed this with an osteotome.  We placed a corkscrew guide in the femoral head and removed the femoral head in its entirety and found evidence of osteoarthritis and osteonecrosis.  We then placed a bent Hohmann  over the medial acetabular rim and removed remnants of acetabular labrum and other debris.  We then began reaming under direct visualization from a size 44 reamer in stepwise increments up to a size 49, with all reamers under direct visualization, the  last reamer was also placed under direct fluoroscopy, so we could obtain our depth in reaming our inclination and anteversion.  I then placed the real DePuy Sector Gription acetabular component size 50 and a 32+4 polyethylene liner based on what felt  like she needed a little bit of offset.  Attention was then turned to the femur.  With the leg externally rotated to 120 degrees, extended and adducted, we were able to place a Mueller retractor medially and a Hohmann retractor above the greater  trochanter.  We released the lateral joint capsule and used a box-cutting osteotome to enter the femoral canal and a rongeur to lateralize.  We then began broaching using the Corail broaching system from a size 8 going up to a size 11.  With a size 11 in  place, we trialed a standard offset femoral neck and a 32+1 hip ball, reduced this in the acetabulum and we felt like we needed just a little bit more leg length based off of the greater trochanter and tip of the head center and there was still a little  bit of play with shuck in the hip on examination.  We dislocated the hip and removed the trial components.  I placed the real Corail femoral component with standard offset size 11 and we went with a 32+5 metal hip  ball and again reduced this in the  acetabulum and it was very tight.  We definitely lengthened her.  She was started off significantly short before the surgery.  We verified the placement of the components under direct visualization and fluoroscopy and put the hip through range of motion;  it was stable.  We then irrigated all 3 wounds with normal saline solution using pulse lavage.  I closed the joint capsule with interrupted #1 Ethibond suture, followed by the tensor fascia being closed with #1 Vicryl, 0 Vicryl was used to close the  deep tissue and 2-0 Vicryl was used to close subcutaneous tissue.  The skin was closed with staples.  The other 2 incisions were closed with 0, closing the deep tissue and 2-0 closing the subcutaneous tissue, as well as the skin reapproximated with  staples.  Xeroform was placed on all 3 incisions as well as well-padded sterile dressing.  The patient was taken off the Hana table and taken to recovery room in stable condition with all final counts being correct.  No complications noted.  Of note Benita Stabile, PA-C, assisted during the entire case.  His assistance was crucial for facilitating all aspects of this case.  VN/NUANCE  D:09/24/2020 T:09/24/2020 JOB:[id number]

## 2020-09-24 NOTE — Interval H&P Note (Signed)
History and Physical Interval Note: The patient understands she is here today for a total hip arthroplasty of her right hip.  She understands that we have to take out previous hardware from a broken retractor fracture from 2016 that eventually is healed.  She has debilitating arthritis in that right hip.  She has had no other acute change in medical status.  See recent H&P.  The risk evidence of surgery been explained in detail and informed consent is obtained.  09/24/2020 11:56 AM  Jeanne Smith  has presented today for surgery, with the diagnosis of Osteoarthritis Right Hip, Retained Hardware Right Hip.  The various methods of treatment have been discussed with the patient and family. After consideration of risks, benefits and other options for treatment, the patient has consented to  Procedure(s): REMOVAL OF IM ROD/HIP SCREW RIGHT HIP (Right) RIGHT TOTAL HIP ARTHROPLASTY ANTERIOR APPROACH (Right) as a surgical intervention.  The patient's history has been reviewed, patient examined, no change in status, stable for surgery.  I have reviewed the patient's chart and labs.  Questions were answered to the patient's satisfaction.     Mcarthur Rossetti

## 2020-09-25 DIAGNOSIS — Z20822 Contact with and (suspected) exposure to covid-19: Secondary | ICD-10-CM | POA: Diagnosis present

## 2020-09-25 DIAGNOSIS — E7800 Pure hypercholesterolemia, unspecified: Secondary | ICD-10-CM | POA: Diagnosis present

## 2020-09-25 DIAGNOSIS — D62 Acute posthemorrhagic anemia: Secondary | ICD-10-CM | POA: Diagnosis not present

## 2020-09-25 DIAGNOSIS — M1611 Unilateral primary osteoarthritis, right hip: Secondary | ICD-10-CM | POA: Diagnosis present

## 2020-09-25 DIAGNOSIS — E785 Hyperlipidemia, unspecified: Secondary | ICD-10-CM | POA: Diagnosis present

## 2020-09-25 DIAGNOSIS — I10 Essential (primary) hypertension: Secondary | ICD-10-CM | POA: Diagnosis present

## 2020-09-25 DIAGNOSIS — Z87891 Personal history of nicotine dependence: Secondary | ICD-10-CM | POA: Diagnosis not present

## 2020-09-25 DIAGNOSIS — M879 Osteonecrosis, unspecified: Secondary | ICD-10-CM | POA: Diagnosis present

## 2020-09-25 DIAGNOSIS — Z79899 Other long term (current) drug therapy: Secondary | ICD-10-CM | POA: Diagnosis not present

## 2020-09-25 DIAGNOSIS — Z90710 Acquired absence of both cervix and uterus: Secondary | ICD-10-CM | POA: Diagnosis not present

## 2020-09-25 DIAGNOSIS — E039 Hypothyroidism, unspecified: Secondary | ICD-10-CM | POA: Diagnosis present

## 2020-09-25 LAB — CBC
HCT: 30.7 % — ABNORMAL LOW (ref 36.0–46.0)
Hemoglobin: 10.4 g/dL — ABNORMAL LOW (ref 12.0–15.0)
MCH: 35.3 pg — ABNORMAL HIGH (ref 26.0–34.0)
MCHC: 33.9 g/dL (ref 30.0–36.0)
MCV: 104.1 fL — ABNORMAL HIGH (ref 80.0–100.0)
Platelets: 111 K/uL — ABNORMAL LOW (ref 150–400)
RBC: 2.95 MIL/uL — ABNORMAL LOW (ref 3.87–5.11)
RDW: 13.2 % (ref 11.5–15.5)
WBC: 5.2 K/uL (ref 4.0–10.5)
nRBC: 0 % (ref 0.0–0.2)

## 2020-09-25 LAB — BASIC METABOLIC PANEL WITH GFR
Anion gap: 11 (ref 5–15)
BUN: 19 mg/dL (ref 8–23)
CO2: 26 mmol/L (ref 22–32)
Calcium: 8.8 mg/dL — ABNORMAL LOW (ref 8.9–10.3)
Chloride: 101 mmol/L (ref 98–111)
Creatinine, Ser: 0.95 mg/dL (ref 0.44–1.00)
GFR, Estimated: >60 mL/min
Glucose, Bld: 131 mg/dL — ABNORMAL HIGH (ref 70–99)
Potassium: 4.1 mmol/L (ref 3.5–5.1)
Sodium: 138 mmol/L (ref 135–145)

## 2020-09-25 MED ORDER — FINASTERIDE 5 MG PO TABS
2.5000 mg | ORAL_TABLET | Freq: Every day | ORAL | Status: DC
  Administered 2020-09-25: 2.5 mg via ORAL
  Filled 2020-09-25: qty 0.5

## 2020-09-25 MED ORDER — OXYCODONE HCL 5 MG PO TABS
5.0000 mg | ORAL_TABLET | ORAL | 0 refills | Status: DC | PRN
Start: 2020-09-25 — End: 2020-09-30

## 2020-09-25 MED ORDER — ASPIRIN 81 MG PO CHEW: 81.0000 mg | CHEWABLE_TABLET | Freq: Two times a day (BID) | ORAL | 0 refills | Status: AC

## 2020-09-25 NOTE — Progress Notes (Signed)
Physical Therapy Treatment Patient Details Name: Jeanne Smith MRN: [id number] DOB: [birth year] Today's Date: 09/25/2020    History of Present Illness Pt is a 69 year old female s/p REMOVAL OF IM ROD/HIP SCREW RIGHT HIP and Right direct anterior THA on 09/24/20 with PMHx significant for L THA and R humeral fx    PT Comments    Pt premedicated for session.  Pt ambulated in hallway and a little unsteady when not holding RW requiring min assist for balance, educated on safety.  Pt plans to d/c home tomorrow.   Follow Up Recommendations  Follow surgeon's recommendation for DC plan and follow-up therapies     Equipment Recommendations  None recommended by PT    Recommendations for Other Services       Precautions / Restrictions Precautions Precautions: Fall Restrictions RLE Weight Bearing: Weight bearing as tolerated    Mobility  Bed Mobility Overal bed mobility: Needs Assistance Bed Mobility: Sit to Supine     Supine to sit: Min guard Sit to supine: Supervision   General bed mobility comments: effortful however no physical assist required  Transfers Overall transfer level: Needs assistance Equipment used: Rolling walker (2 wheeled) Transfers: Sit to/from Stand Sit to Stand: Min guard         General transfer comment: min/guard for safety, cues for technique  Ambulation/Gait Ambulation/Gait assistance: Min assist;Min guard Gait Distance (Feet): 240 Feet Assistive device: Rolling walker (2 wheeled) Gait Pattern/deviations: Step-to pattern;Decreased stance time - right;Antalgic     General Gait Details: verbal cues for sequence, RW positioning, posture; pt with shaky UEs; pt with unsteadiness with use of UEs (ie letting go of RW while walking) and required min assist   Stairs             Wheelchair Mobility    Modified Rankin (Stroke Patients Only)       Balance                                            Cognition  Arousal/Alertness: Awake/alert Behavior During Therapy: WFL for tasks assessed/performed;Anxious Overall Cognitive Status: Within Functional Limits for tasks assessed                                        Exercises Total Joint Exercises Hip ABduction/ADduction: AROM;Right;10 reps;Standing Long Arc Quad: AROM;Right;Seated;10 reps Knee Flexion: AROM;Right;10 reps;Standing Marching in Standing: AROM;Right;Standing;10 reps Standing Hip Extension: AROM;Right;10 reps;Standing    General Comments        Pertinent Vitals/Pain Pain Assessment: 0-10 Pain Score: 5  Pain Location: right hip Pain Descriptors / Indicators: Grimacing;Aching;Sore Pain Intervention(s): Monitored during session;Premedicated before session;Repositioned;Ice applied    Home Living Family/patient expects to be discharged to:: Private residence Living Arrangements: Alone   Type of Home: House Home Access: Stairs to enter Entrance Stairs-Rails: Right;Left Home Layout: Able to live on main level with bedroom/bathroom Home Equipment: Walker - 2 wheels;Bedside commode      Prior Function Level of Independence: Independent          PT Goals (current goals can now be found in the care plan section) Acute Rehab PT Goals PT Goal Formulation: With patient Time For Goal Achievement: 10/01/20 Potential to Achieve Goals: Good Progress towards PT goals: Progressing toward goals    Frequency  7X/week      PT Plan Current plan remains appropriate    Co-evaluation              AM-PAC PT "6 Clicks" Mobility   Outcome Measure  Help needed turning from your back to your side while in a flat bed without using bedrails?: A Little Help needed moving from lying on your back to sitting on the side of a flat bed without using bedrails?: A Little Help needed moving to and from a bed to a chair (including a wheelchair)?: A Little Help needed standing up from a chair using your arms (Smith.g.,  wheelchair or bedside chair)?: A Little Help needed to walk in hospital room?: A Little Help needed climbing 3-5 steps with a railing? : A Little 6 Click Score: 18    End of Session Equipment Utilized During Treatment: Gait belt Activity Tolerance: Patient tolerated treatment well Patient left: with call bell/phone within reach;in bed;with bed alarm set Nurse Communication: Mobility status PT Visit Diagnosis: Other abnormalities of gait and mobility (R26.89)     Time: 1761-6073 PT Time Calculation (min) (ACUTE ONLY): 17 min  Charges:  $Therapeutic Exercise: 8-22 mins                     {Kati PT, DPT Acute Rehabilitation Services Pager: [phone] Office: [phone]  Jeanne Smith 09/25/2020, 3:54 PM

## 2020-09-25 NOTE — Plan of Care (Signed)
°  Problem: Clinical Measurements: °Goal: Respiratory complications will improve °Outcome: Progressing °  °Problem: Clinical Measurements: °Goal: Cardiovascular complication will be avoided °Outcome: Progressing °  °Problem: Pain Managment: °Goal: General experience of comfort will improve °Outcome: Progressing °  °Problem: Safety: °Goal: Ability to remain free from injury will improve °Outcome: Progressing °  °

## 2020-09-25 NOTE — Evaluation (Signed)
Physical Therapy Evaluation Patient Details Name: Jeanne Smith MRN: [id number] DOB: 1950/12/06 Today's Date: 09/25/2020   History of Present Illness  Pt is a 69 year old female s/p REMOVAL OF IM ROD/HIP SCREW RIGHT HIP and Right direct anterior THA on 09/24/20 with PMHx significant for L THA and R humeral fx  Clinical Impression  Pt is s/p THA resulting in the deficits listed below (see PT Problem List).  Pt will benefit from skilled PT to increase their independence and safety with mobility to allow discharge to the venue listed below.  Pt assisted with ambulating in hallway.  Pt reports being familiar with THA from previous L THA.  Pt likely to d/c home tomorrow.      Follow Up Recommendations Follow surgeon's recommendation for DC plan and follow-up therapies    Equipment Recommendations  None recommended by PT    Recommendations for Other Services       Precautions / Restrictions Precautions Precautions: Fall Restrictions RLE Weight Bearing: Weight bearing as tolerated      Mobility  Bed Mobility Overal bed mobility: Needs Assistance Bed Mobility: Supine to Sit     Supine to sit: Min guard     General bed mobility comments: effortful however no physical assist required    Transfers Overall transfer level: Needs assistance Equipment used: Rolling walker (2 wheeled) Transfers: Sit to/from Stand Sit to Stand: Min guard         General transfer comment: min/guard for safety, cues for technique  Ambulation/Gait Ambulation/Gait assistance: Min guard Gait Distance (Feet): 240 Feet Assistive device: Rolling walker (2 wheeled) Gait Pattern/deviations: Step-to pattern;Decreased stance time - right;Antalgic     General Gait Details: verbal cues for sequence, RW positioning, posture; pt with shaky UEs; min/guard for safety  Stairs            Wheelchair Mobility    Modified Rankin (Stroke Patients Only)       Balance                                              Pertinent Vitals/Pain Pain Assessment: 0-10 Pain Score: 6  Pain Location: right hip Pain Descriptors / Indicators: Grimacing;Aching;Sore Pain Intervention(s): Premedicated before session;Repositioned;Monitored during session;Ice applied    Home Living Family/patient expects to be discharged to:: Private residence Living Arrangements: Alone   Type of Home: House Home Access: Stairs to enter Entrance Stairs-Rails: Psychiatric nurse of Steps: 3 Home Layout: Able to live on main level with bedroom/bathroom Home Equipment: Walker - 2 wheels;Bedside commode      Prior Function Level of Independence: Independent               Hand Dominance        Extremity/Trunk Assessment        Lower Extremity Assessment Lower Extremity Assessment: RLE deficits/detail RLE Deficits / Details: anticipated post op hip weakness       Communication   Communication: No difficulties  Cognition Arousal/Alertness: Awake/alert Behavior During Therapy: WFL for tasks assessed/performed;Anxious Overall Cognitive Status: Within Functional Limits for tasks assessed                                        General Comments      Exercises  Assessment/Plan    PT Assessment Patient needs continued PT services  PT Problem List Decreased strength;Decreased mobility;Decreased knowledge of use of DME;Decreased range of motion;Decreased activity tolerance;Pain       PT Treatment Interventions DME instruction;Therapeutic exercise;Gait training;Balance training;Stair training;Functional mobility training;Therapeutic activities;Patient/family education    PT Goals (Current goals can be found in the Care Plan section)  Acute Rehab PT Goals PT Goal Formulation: With patient Time For Goal Achievement: 10/01/20 Potential to Achieve Goals: Good    Frequency 7X/week   Barriers to discharge        Co-evaluation                AM-PAC PT "6 Clicks" Mobility  Outcome Measure Help needed turning from your back to your side while in a flat bed without using bedrails?: A Little Help needed moving from lying on your back to sitting on the side of a flat bed without using bedrails?: A Little Help needed moving to and from a bed to a chair (including a wheelchair)?: A Little Help needed standing up from a chair using your arms (e.g., wheelchair or bedside chair)?: A Little Help needed to walk in hospital room?: A Little Help needed climbing 3-5 steps with a railing? : A Little 6 Click Score: 18    End of Session Equipment Utilized During Treatment: Gait belt Activity Tolerance: Patient tolerated treatment well Patient left: in chair;with call bell/phone within reach Nurse Communication: Mobility status PT Visit Diagnosis: Other abnormalities of gait and mobility (R26.89)    Time: 2233-6122 PT Time Calculation (min) (ACUTE ONLY): 17 min   Charges:   PT Evaluation $PT Eval Low Complexity: 1 Low        Kati PT, DPT Acute Rehabilitation Services Pager: [phone] Office: [phone]  York Ram E 09/25/2020, 1:26 PM

## 2020-09-25 NOTE — Discharge Instructions (Signed)

## 2020-09-25 NOTE — Plan of Care (Signed)
Plan of care reviewed and discussed with the patient. 

## 2020-09-25 NOTE — Progress Notes (Signed)
Subjective: 1 Day Post-Op Procedure(s) (LRB): REMOVAL OF IM ROD/HIP SCREW RIGHT HIP (Right) RIGHT TOTAL HIP ARTHROPLASTY ANTERIOR APPROACH (Right) Patient reports pain as moderate.  Has not been up much as of yet.  Acute blood loss anemia from extensive surgery, but asymptomatic with stable vitals.  Objective: Vital signs in last 24 hours: Temp:  [98.1 F (36.7 C)-98.8 F (37.1 C)] 98.8 F (37.1 C) (10/30 0614) Pulse Rate:  [83-103] 98 (10/30 0614) Resp:  [11-22] 16 (10/30 0614) BP: (81-142)/(57-88) 125/88 (10/30 0614) SpO2:  [96 %-100 %] 98 % (10/30 5364)  Intake/Output from previous day: 10/29 0701 - 10/30 0700 In: 3467.9 [P.O.:420; I.V.:2697.9; IV Piggyback:350] Out: 1950 [WOEHO:1224; Blood:300] Intake/Output this shift: Total I/O In: 350.3 [P.O.:240; I.V.:110.3] Out: 250 [Urine:250]  Recent Labs    09/25/20 0311  HGB 10.4*   Recent Labs    09/25/20 0311  WBC 5.2  RBC 2.95*  HCT 30.7*  PLT 111*   Recent Labs    09/25/20 0311  NA 138  K 4.1  CL 101  CO2 26  BUN 19  CREATININE 0.95  GLUCOSE 131*  CALCIUM 8.8*   No results for input(s): LABPT, INR in the last 72 hours.  Sensation intact distally Intact pulses distally Dorsiflexion/Plantar flexion intact Incision: dressing C/D/I   Assessment/Plan: 1 Day Post-Op Procedure(s) (LRB): REMOVAL OF IM ROD/HIP SCREW RIGHT HIP (Right) RIGHT TOTAL HIP ARTHROPLASTY ANTERIOR APPROACH (Right) Up with therapy Plan for discharge tomorrow Discharge home with home health      Mcarthur Rossetti 09/25/2020, 10:30 AM

## 2020-09-26 NOTE — Progress Notes (Signed)
Physical Therapy Treatment Patient Details Name: Jeanne Smith MRN: [id number] DOB: [birth year] Today's Date: 09/26/2020    History of Present Illness Pt is a 69 year old female s/p REMOVAL OF IM ROD/HIP SCREW RIGHT HIP and Right direct anterior THA on 09/24/20 with PMHx significant for L THA and R humeral fx    PT Comments    Pt ambulated in hallway and practiced safe stair technique.  Pt declined performing exercises however reviewed HEP handout.  Pt feels ready for d/c home today.  Daughter to stay with pt upon d/c initially.    Follow Up Recommendations  Follow surgeon's recommendation for DC plan and follow-up therapies     Equipment Recommendations  None recommended by PT    Recommendations for Other Services       Precautions / Restrictions Precautions Precautions: Fall Restrictions RLE Weight Bearing: Weight bearing as tolerated    Mobility  Bed Mobility               General bed mobility comments: pt in recliner  Transfers Overall transfer level: Needs assistance Equipment used: Rolling walker (2 wheeled) Transfers: Sit to/from Stand           General transfer comment: min/guard for safety, cues for technique  Ambulation/Gait Ambulation/Gait assistance: Min guard Gait Distance (Feet): 250 Feet Assistive device: Rolling walker (2 wheeled) Gait Pattern/deviations: Step-to pattern;Decreased stance time - right;Antalgic     General Gait Details: verbal cues for sequence, RW positioning, posture; again discussed safety with keeping RW near and staying within RW   Stairs Stairs: Yes Stairs assistance: Min guard Stair Management: Step to pattern;Forwards;Two rails Number of Stairs: 3 General stair comments: verbal cues for safety and sequence; pt performed twice and had no questions   Wheelchair Mobility    Modified Rankin (Stroke Patients Only)       Balance                                            Cognition  Arousal/Alertness: Awake/alert Behavior During Therapy: WFL for tasks assessed/performed;Anxious Overall Cognitive Status: Within Functional Limits for tasks assessed                                        Exercises      General Comments        Pertinent Vitals/Pain Pain Assessment: 0-10 Pain Score: 4  Pain Location: right hip Pain Descriptors / Indicators: Grimacing;Aching;Sore Pain Intervention(s): Repositioned;Premedicated before session;Monitored during session;Ice applied    Home Living                      Prior Function            PT Goals (current goals can now be found in the care plan section) Progress towards PT goals: Progressing toward goals    Frequency    7X/week      PT Plan Current plan remains appropriate    Co-evaluation              AM-PAC PT "6 Clicks" Mobility   Outcome Measure  Help needed turning from your back to your side while in a flat bed without using bedrails?: A Little Help needed moving from lying on your back to sitting on the side  of a flat bed without using bedrails?: A Little Help needed moving to and from a bed to a chair (including a wheelchair)?: A Little Help needed standing up from a chair using your arms (e.g., wheelchair or bedside chair)?: A Little Help needed to walk in hospital room?: A Little Help needed climbing 3-5 steps with a railing? : A Little 6 Click Score: 18    End of Session Equipment Utilized During Treatment: Gait belt Activity Tolerance: Patient tolerated treatment well Patient left: with call bell/phone within reach;in chair Nurse Communication: Mobility status PT Visit Diagnosis: Other abnormalities of gait and mobility (R26.89)     Time: 4069-8614 PT Time Calculation (min) (ACUTE ONLY): 18 min  Charges:  $Gait Training: 8-22 mins                     Arlyce Dice, DPT Acute Rehabilitation Services Pager: [phone] Office: [phone]  York Ram  E 09/26/2020, 1:07 PM

## 2020-09-26 NOTE — Plan of Care (Signed)
Pt stable at this time. No needs at this time. Rn medicated pt for pain. Pt to d/c home today when family arrives. Pt needs no dme for home. Pt dressing clean, dry, and intact. Rn will continue to monitor.

## 2020-09-26 NOTE — Progress Notes (Signed)
  Subjective: Pt doing well - was up oob when I came in - making good progress with mobilization   Objective: Vital signs in last 24 hours: Temp:  [97.9 F (36.6 C)-99.1 F (37.3 C)] 98.9 F (37.2 C) (10/31 0432) Pulse Rate:  [97-109] 109 (10/31 0432) Resp:  [16-18] 16 (10/31 0432) BP: (103-119)/(59-75) 119/62 (10/31 0432) SpO2:  [95 %-99 %] 95 % (10/31 0432)  Intake/Output from previous day: 10/30 0701 - 10/31 0700 In: 1850.3 [P.O.:1740; I.V.:110.3] Out: 2100 [Urine:2100] Intake/Output this shift: Total I/O In: -  Out: 200 [Urine:200]  Exam:  Dorsiflexion/Plantar flexion intact  Labs: Recent Labs    09/25/20 0311  HGB 10.4*   Recent Labs    09/25/20 0311  WBC 5.2  RBC 2.95*  HCT 30.7*  PLT 111*   Recent Labs    09/25/20 0311  NA 138  K 4.1  CL 101  CO2 26  BUN 19  CREATININE 0.95  GLUCOSE 131*  CALCIUM 8.8*   No results for input(s): LABPT, INR in the last 72 hours.  Assessment/Plan: Plan dc today after PT    - late this am or early this afternoon   G Alphonzo Severance 09/26/2020, 8:33 AM

## 2020-09-26 NOTE — Plan of Care (Signed)
  Problem: Activity: Goal: Risk for activity intolerance will decrease Outcome: Progressing   Problem: Pain Managment: Goal: General experience of comfort will improve Outcome: Progressing   Problem: Safety: Goal: Ability to remain free from injury will improve Outcome: Progressing   Problem: Activity: Goal: Ability to avoid complications of mobility impairment will improve Outcome: Progressing   Problem: Skin Integrity: Goal: Will show signs of wound healing Outcome: Progressing

## 2020-09-27 ENCOUNTER — Telehealth: Payer: Self-pay | Admitting: *Deleted

## 2020-09-27 ENCOUNTER — Encounter (HOSPITAL_COMMUNITY): Payer: Self-pay | Admitting: Orthopaedic Surgery

## 2020-09-27 NOTE — Discharge Summary (Signed)
Patient ID: Jeanne Smith MRN: [id number] DOB/AGE: [birth year] 69 y.o.  Admit date: 09/24/2020 Discharge date: 09/27/2020  Admission Diagnoses:  Principal Problem:   Unilateral primary osteoarthritis, right hip Active Problems:   Status post total replacement of right hip   Discharge Diagnoses:  Status post right total hip arthroplasty Acute blood loss anemia asymptomatic  Past Medical History:  Diagnosis Date  . Anxiety   . Arthritis    oa  . Attention deficit disorder   . Dysrhythmia 2019   episode of palpitations x 25 January 2018, stress test normal  . H/O seasonal allergies   . Hyperlipidemia   . Hypertension   . Hypothyroidism    thyroid nodules, off synthroid > 15 years  . Insomnia   . Osteoarthritis   . Osteopenia   . Thyroid nodule     Surgeries: Procedure(s): REMOVAL OF IM ROD/HIP SCREW RIGHT HIP RIGHT TOTAL HIP ARTHROPLASTY ANTERIOR APPROACH on 09/24/2020   Consultants:   Discharged Condition: Improved  Hospital Course: Jeanne Smith is an 69 y.o. female who was admitted 09/24/2020 for operative treatment ofUnilateral primary osteoarthritis, right hip. Patient has severe unremitting pain that affects sleep, daily activities, and work/hobbies. After pre-op clearance the patient was taken to the operating room on 09/24/2020 and underwent  Procedure(s): REMOVAL OF IM ROD/HIP SCREW RIGHT HIP RIGHT TOTAL HIP ARTHROPLASTY ANTERIOR APPROACH.    Patient was given perioperative antibiotics:  Anti-infectives (From admission, onward)   Start     Dose/Rate Route Frequency Ordered Stop   09/24/20 1900  ceFAZolin (ANCEF) IVPB 1 g/50 mL premix        1 g 100 mL/hr over 30 Minutes Intravenous Every 6 hours 09/24/20 1805 09/25/20 0031   09/24/20 1030  ceFAZolin (ANCEF) IVPB 2g/100 mL premix        2 g 200 mL/hr over 30 Minutes Intravenous On call to O.R. 09/24/20 1018 09/24/20 1332       Patient was given sequential compression devices, early ambulation, and  chemoprophylaxis to prevent DVT.  Patient benefited maximally from hospital stay and there were no complications.    Recent vital signs: No data found.   Recent laboratory studies:  Recent Labs    09/25/20 0311  WBC 5.2  HGB 10.4*  HCT 30.7*  PLT 111*  NA 138  K 4.1  CL 101  CO2 26  BUN 19  CREATININE 0.95  GLUCOSE 131*  CALCIUM 8.8*     Discharge Medications:   Allergies as of 09/26/2020   No Known Allergies     Medication List    STOP taking these medications   HYDROcodone-acetaminophen 5-325 MG tablet Commonly known as: NORCO/VICODIN     TAKE these medications   ALPRAZolam 0.25 MG tablet Commonly known as: XANAX TAKE 1 TABLET(0.25 MG) BY MOUTH TWICE DAILY AS NEEDED FOR ANXIETY   aspirin 81 MG chewable tablet Chew 1 tablet (81 mg total) by mouth 2 (two) times daily.   b complex vitamins tablet Take 1 tablet by mouth daily.   CALCIUM CITRATE + D PO Take 2 tablets by mouth at bedtime. Mag   cycloSPORINE 0.05 % ophthalmic emulsion Commonly known as: RESTASIS Place 1 drop into both eyes 2 (two) times daily.   EQL OMEGA 3 FISH OIL PO Take 565 mg by mouth daily. Complete   estradiol 2 MG tablet Commonly known as: ESTRACE Take 2 mg by mouth at bedtime.   finasteride 5 MG tablet Commonly known as: PROSCAR Take 2.5 mg by  mouth at bedtime.   hydrochlorothiazide 25 MG tablet Commonly known as: HYDRODIURIL Take 1 tablet (25 mg total) by mouth daily.   multivitamin with minerals tablet Take 1 tablet by mouth daily.   olmesartan 40 MG tablet Commonly known as: BENICAR TAKE 1 TABLET(40 MG) BY MOUTH DAILY What changed: See the new instructions.   oxyCODONE 5 MG immediate release tablet Commonly known as: Oxy IR/ROXICODONE Take 1-2 tablets (5-10 mg total) by mouth every 4 (four) hours as needed for moderate pain (pain score 4-6).   polyethylene glycol 17 g packet Commonly known as: MIRALAX / GLYCOLAX Take 17 g by mouth 2 (two) times daily. What  changed:   when to take this  reasons to take this   Retaine HPMC 0.3 % Soln Generic drug: Hypromellose (PF) Place 1 drop into both eyes daily as needed (Dry eyes).   rosuvastatin 10 MG tablet Commonly known as: CRESTOR Take 1 tablet (10 mg total) by mouth at bedtime.   Simply Saline 0.9 % Aers Generic drug: Saline Place 1 spray into both nostrils 2 (two) times daily as needed (congestion).   zolpidem 10 MG tablet Commonly known as: AMBIEN TAKE 1 TABLET(10 MG) BY MOUTH AT BEDTIME AS NEEDED FOR SLEEP       Diagnostic Studies: DG Pelvis Portable  Result Date: 09/24/2020 CLINICAL DATA:  Status post right hip replacement EXAM: PORTABLE PELVIS 1-2 VIEWS COMPARISON:  09/24/2020, 05/16/2018 FINDINGS: Previous left hip replacement with intact hardware and normal alignment. Interval right hip replacement with intact hardware and normal alignment. Ghost track within the femoral trochanter from previous hardware. Gas in the soft tissues consistent with recent surgery. IMPRESSION: Interval right hip replacement with expected postsurgical changes. Electronically Signed   By: Donavan Foil M.D.   On: 09/24/2020 16:39   DG C-Arm 1-60 Min-No Report  Result Date: 09/24/2020 Fluoroscopy was utilized by the requesting physician.  No radiographic interpretation.   DG HIP OPERATIVE UNILAT W OR W/O PELVIS RIGHT  Result Date: 09/24/2020 CLINICAL DATA:  Removal of hardware and placement of a RIGHT hip prosthesis EXAM: OPERATIVE RIGHT HIP (WITH PELVIS IF PERFORMED) 3 VIEWS TECHNIQUE: Fluoroscopic spot image(s) were submitted for interpretation post-operatively. COMPARISON:  06/15/2020 FLUOROSCOPY TIME:  0 minutes 26 seconds Dose: 2.7792 mGy FINDINGS: Osseous demineralization. Interval removal of IM nail and compression screw at proximal RIGHT femur. Components of a RIGHT hip prosthesis are newly identified. No fracture or dislocation. Incidental LEFT prosthesis noted. IMPRESSION: New RIGHT hip  prosthesis without acute complication. Electronically Signed   By: Lavonia Dana M.D.   On: 09/24/2020 16:09    Disposition: Discharge disposition: 01-Home or Self Care       Discharge Instructions    Call MD / Call 911   Complete by: As directed    If you experience chest pain or shortness of breath, CALL 911 and be transported to the hospital emergency room.  If you develope a fever above 101 F, pus (white drainage) or increased drainage or redness at the wound, or calf pain, call your surgeon's office.   Constipation Prevention   Complete by: As directed    Drink plenty of fluids.  Prune juice may be helpful.  You may use a stool softener, such as Colace (over the counter) 100 mg twice a day.  Use MiraLax (over the counter) for constipation as needed.   Diet - low sodium heart healthy   Complete by: As directed    Increase activity slowly as tolerated  Complete by: As directed        Follow-up Information    Mcarthur Rossetti, MD. Go on 10/07/2020.   Specialty: Orthopedic Surgery Why: at 1:00 pm with Benita Stabile, PA-C for Dr. Ninfa Linden for your 2 week post-op appointment Contact information: [place] [address] [phone]        Home[facility] Follow up.   Specialty: [place] Why: Someone from the home health agency will be in contact with you after discharge to arrange your first in home therapy visit Contact information: [address] [place] [address] [phone]                Signed: Erskine Emery 09/27/2020, 11:26 AM

## 2020-09-27 NOTE — Telephone Encounter (Signed)
Ortho bundle D/C call completed. 

## 2020-09-30 ENCOUNTER — Telehealth: Payer: Self-pay | Admitting: *Deleted

## 2020-09-30 DIAGNOSIS — Z96643 Presence of artificial hip joint, bilateral: Secondary | ICD-10-CM | POA: Diagnosis not present

## 2020-09-30 DIAGNOSIS — I10 Essential (primary) hypertension: Secondary | ICD-10-CM | POA: Diagnosis not present

## 2020-09-30 DIAGNOSIS — F419 Anxiety disorder, unspecified: Secondary | ICD-10-CM | POA: Diagnosis not present

## 2020-09-30 DIAGNOSIS — G4700 Insomnia, unspecified: Secondary | ICD-10-CM | POA: Diagnosis not present

## 2020-09-30 DIAGNOSIS — J302 Other seasonal allergic rhinitis: Secondary | ICD-10-CM | POA: Diagnosis not present

## 2020-09-30 DIAGNOSIS — Z87891 Personal history of nicotine dependence: Secondary | ICD-10-CM | POA: Diagnosis not present

## 2020-09-30 DIAGNOSIS — M8580 Other specified disorders of bone density and structure, unspecified site: Secondary | ICD-10-CM | POA: Diagnosis not present

## 2020-09-30 DIAGNOSIS — E785 Hyperlipidemia, unspecified: Secondary | ICD-10-CM | POA: Diagnosis not present

## 2020-09-30 DIAGNOSIS — Z471 Aftercare following joint replacement surgery: Secondary | ICD-10-CM | POA: Diagnosis not present

## 2020-09-30 DIAGNOSIS — F909 Attention-deficit hyperactivity disorder, unspecified type: Secondary | ICD-10-CM | POA: Diagnosis not present

## 2020-09-30 MED ORDER — OXYCODONE HCL 5 MG PO TABS
5.0000 mg | ORAL_TABLET | ORAL | 0 refills | Status: DC | PRN
Start: 2020-09-30 — End: 2020-10-05

## 2020-09-30 NOTE — Telephone Encounter (Signed)
Will do!

## 2020-09-30 NOTE — Telephone Encounter (Signed)
Patient called and requested refill of pain medication. THanks.

## 2020-10-01 ENCOUNTER — Telehealth: Payer: Self-pay | Admitting: *Deleted

## 2020-10-01 NOTE — Telephone Encounter (Signed)
Ortho bundle 7 day call completed. 

## 2020-10-04 DIAGNOSIS — J302 Other seasonal allergic rhinitis: Secondary | ICD-10-CM | POA: Diagnosis not present

## 2020-10-04 DIAGNOSIS — I10 Essential (primary) hypertension: Secondary | ICD-10-CM | POA: Diagnosis not present

## 2020-10-04 DIAGNOSIS — F419 Anxiety disorder, unspecified: Secondary | ICD-10-CM | POA: Diagnosis not present

## 2020-10-04 DIAGNOSIS — E785 Hyperlipidemia, unspecified: Secondary | ICD-10-CM | POA: Diagnosis not present

## 2020-10-04 DIAGNOSIS — F909 Attention-deficit hyperactivity disorder, unspecified type: Secondary | ICD-10-CM | POA: Diagnosis not present

## 2020-10-04 DIAGNOSIS — Z471 Aftercare following joint replacement surgery: Secondary | ICD-10-CM | POA: Diagnosis not present

## 2020-10-05 ENCOUNTER — Telehealth: Payer: Self-pay | Admitting: *Deleted

## 2020-10-05 MED ORDER — OXYCODONE HCL 5 MG PO TABS: 5.0000 mg | ORAL_TABLET | Freq: Four times a day (QID) | ORAL | 0 refills | Status: DC | PRN

## 2020-10-05 NOTE — Telephone Encounter (Signed)
Patient called requesting refill of pain medication. Thanks.

## 2020-10-06 DIAGNOSIS — F419 Anxiety disorder, unspecified: Secondary | ICD-10-CM | POA: Diagnosis not present

## 2020-10-06 DIAGNOSIS — Z471 Aftercare following joint replacement surgery: Secondary | ICD-10-CM | POA: Diagnosis not present

## 2020-10-06 DIAGNOSIS — F909 Attention-deficit hyperactivity disorder, unspecified type: Secondary | ICD-10-CM | POA: Diagnosis not present

## 2020-10-06 DIAGNOSIS — J302 Other seasonal allergic rhinitis: Secondary | ICD-10-CM | POA: Diagnosis not present

## 2020-10-06 DIAGNOSIS — I10 Essential (primary) hypertension: Secondary | ICD-10-CM | POA: Diagnosis not present

## 2020-10-06 DIAGNOSIS — E785 Hyperlipidemia, unspecified: Secondary | ICD-10-CM | POA: Diagnosis not present

## 2020-10-07 ENCOUNTER — Ambulatory Visit (INDEPENDENT_AMBULATORY_CARE_PROVIDER_SITE_OTHER): Payer: Medicare Other | Admitting: Physician Assistant

## 2020-10-07 ENCOUNTER — Encounter: Payer: Self-pay | Admitting: Physician Assistant

## 2020-10-07 DIAGNOSIS — Z96641 Presence of right artificial hip joint: Secondary | ICD-10-CM

## 2020-10-07 MED ORDER — METHOCARBAMOL 500 MG PO TABS: 500.0000 mg | ORAL_TABLET | Freq: Three times a day (TID) | ORAL | 1 refills | Status: DC

## 2020-10-07 NOTE — Progress Notes (Signed)
HPI: Jeanne Smith returns today status post right total hip arthroplasty and removal of retained right hip hardware.  She states she is in a lot of pain.  She has had no fevers chills.  No shortness of breath.  She is on aspirin for DVT prophylaxis.  She is ambulating with a walker.  She knows she is having is some spasm about her hip.  Physical exam: General well-developed well-nourished female in no acute distress.  She has bilateral lower extremity swelling.  No calf pain.  Dorsiflexion plantarflexion bilateral ankles intact.  Right hip guarded range of motion.  Surgical incisions right hip all well approximated with staples no signs of infection.  Slight serous drainage from the most distal lateral incision.  Impression: Status post right total hip arthroplasty Status post removal right hip hardware  Plan: Staples removed Steri-Strips applied.  She will work on scar tissue mobilization.  She will remain on her aspirin once daily for another week then discontinue as she is on no aspirin prior to surgery.  Like to see her back in 2 weeks to see how her incisions are healing.  Did prescribe Robaxin to help with muscle spasm.  Questions were encouraged and answered at length

## 2020-10-08 ENCOUNTER — Other Ambulatory Visit: Payer: Self-pay | Admitting: Physician Assistant

## 2020-10-08 ENCOUNTER — Telehealth: Payer: Self-pay

## 2020-10-08 ENCOUNTER — Telehealth: Payer: Self-pay | Admitting: *Deleted

## 2020-10-08 DIAGNOSIS — E785 Hyperlipidemia, unspecified: Secondary | ICD-10-CM | POA: Diagnosis not present

## 2020-10-08 DIAGNOSIS — J302 Other seasonal allergic rhinitis: Secondary | ICD-10-CM | POA: Diagnosis not present

## 2020-10-08 DIAGNOSIS — F419 Anxiety disorder, unspecified: Secondary | ICD-10-CM | POA: Diagnosis not present

## 2020-10-08 DIAGNOSIS — Z471 Aftercare following joint replacement surgery: Secondary | ICD-10-CM | POA: Diagnosis not present

## 2020-10-08 DIAGNOSIS — I10 Essential (primary) hypertension: Secondary | ICD-10-CM | POA: Diagnosis not present

## 2020-10-08 DIAGNOSIS — F909 Attention-deficit hyperactivity disorder, unspecified type: Secondary | ICD-10-CM | POA: Diagnosis not present

## 2020-10-08 MED ORDER — METHOCARBAMOL 500 MG PO TABS
500.0000 mg | ORAL_TABLET | Freq: Three times a day (TID) | ORAL | 1 refills | Status: DC
Start: 2020-10-08 — End: 2021-01-17

## 2020-10-08 NOTE — Telephone Encounter (Signed)
Ortho bundle 14 call completed.

## 2020-10-08 NOTE — Telephone Encounter (Signed)
Called into pharmacy

## 2020-10-08 NOTE — Telephone Encounter (Signed)
Patient called she stated her rx was supposed to be sent in yesterday but the pharmacy hasn't received anything. She needs a refill for methocarbamol call [phone].

## 2020-10-09 ENCOUNTER — Emergency Department (HOSPITAL_COMMUNITY)
Admission: EM | Admit: 2020-10-09 | Discharge: 2020-10-09 | Disposition: A | Discharge status: Home / Self Care | Payer: Medicare Other | Attending: Emergency Medicine | Admitting: Emergency Medicine

## 2020-10-09 ENCOUNTER — Other Ambulatory Visit: Payer: Self-pay

## 2020-10-09 ENCOUNTER — Emergency Department (HOSPITAL_COMMUNITY): Payer: Medicare Other

## 2020-10-09 DIAGNOSIS — Y92009 Unspecified place in unspecified non-institutional (private) residence as the place of occurrence of the external cause: Secondary | ICD-10-CM | POA: Diagnosis not present

## 2020-10-09 DIAGNOSIS — I10 Essential (primary) hypertension: Secondary | ICD-10-CM | POA: Insufficient documentation

## 2020-10-09 DIAGNOSIS — Z7982 Long term (current) use of aspirin: Secondary | ICD-10-CM | POA: Diagnosis not present

## 2020-10-09 DIAGNOSIS — Z79899 Other long term (current) drug therapy: Secondary | ICD-10-CM | POA: Diagnosis not present

## 2020-10-09 DIAGNOSIS — S79911A Unspecified injury of right hip, initial encounter: Secondary | ICD-10-CM | POA: Diagnosis present

## 2020-10-09 DIAGNOSIS — S0083XA Contusion of other part of head, initial encounter: Secondary | ICD-10-CM | POA: Diagnosis not present

## 2020-10-09 DIAGNOSIS — Z96643 Presence of artificial hip joint, bilateral: Secondary | ICD-10-CM | POA: Diagnosis not present

## 2020-10-09 DIAGNOSIS — W01198A Fall on same level from slipping, tripping and stumbling with subsequent striking against other object, initial encounter: Secondary | ICD-10-CM | POA: Diagnosis not present

## 2020-10-09 DIAGNOSIS — S7001XA Contusion of right hip, initial encounter: Secondary | ICD-10-CM | POA: Diagnosis not present

## 2020-10-09 DIAGNOSIS — E039 Hypothyroidism, unspecified: Secondary | ICD-10-CM | POA: Insufficient documentation

## 2020-10-09 DIAGNOSIS — W19XXXA Unspecified fall, initial encounter: Secondary | ICD-10-CM

## 2020-10-09 DIAGNOSIS — Z043 Encounter for examination and observation following other accident: Secondary | ICD-10-CM | POA: Diagnosis not present

## 2020-10-09 NOTE — ED Triage Notes (Addendum)
Patient reports had right hip replacement 09/24/20, patient fell yesterday and now has blood soaked bandage over incision. Patient reports pain 9/10 (says she had this before she fell). Landed on right side when fell yesterday

## 2020-10-09 NOTE — ED Provider Notes (Signed)
[facility] DEPT Provider Note   CSN: [id number] Arrival date & time: 10/09/20  1227     History Chief Complaint  Patient presents with  . Fall    Jeanne Smith is a 69 y.o. female.  Patient had a right hip replacement about 2 weeks ago.  Her follow-up appointment she was doing well.  Using a walker for ambulation.  She said she slipped going from the bed to the bathroom last night and landed on her right hip and struck her face.  Complaining of increased pain in her hip and some blood on the dressing.  She is also some soreness above her right eye and on her right cheek.  No blurry vision double vision.  No headache.  Has ambulated with walker since fall.  The history is provided by the patient.  Fall This is a new problem. The current episode started 12 to 24 hours ago. The problem has not changed since onset.Pertinent negatives include no chest pain, no abdominal pain, no headaches and no shortness of breath. The symptoms are aggravated by bending, twisting and walking. The symptoms are relieved by rest. She has tried rest for the symptoms. The treatment provided mild relief.       Past Medical History:  Diagnosis Date  . Anxiety   . Arthritis    oa  . Attention deficit disorder   . Dysrhythmia 2019   episode of palpitations x 25 January 2018, stress test normal  . H/O seasonal allergies   . Hyperlipidemia   . Hypertension   . Hypothyroidism    thyroid nodules, off synthroid > 15 years  . Insomnia   . Osteoarthritis   . Osteopenia   . Thyroid nodule     Patient Active Problem List   Diagnosis Date Noted  . 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  . Unilateral primary osteoarthritis, right hip 06/15/2020  . Retained orthopedic hardware 06/15/2020  . Stenosing tenosynovitis of wrist 04/01/2019  . Closed fracture of right wrist 01/29/2019  . Pain in right wrist 01/21/2019  . Pain in right hand 01/21/2019  . S/P left THA,  AA 05/16/2018  . S/P hip replacement 05/16/2018  . Knee pain 03/28/2018  . Pure hypercholesterolemia 12/24/2017  . Intertrochanteric fracture of right femur ([place]) 06/15/2015  . Comminuted right humeral fracture 06/15/2015  . HTN (hypertension) 06/15/2015    Past Surgical History:  Procedure Laterality Date  . ABDOMINAL HYSTERECTOMY    . AUGMENTATION MAMMAPLASTY Bilateral    silicone over 30 years  . CONVERSION TO TOTAL HIP Left 05/16/2018   Procedure: LEFT HIP ANTERIOR APPROACH;  Surgeon: Paralee Cancel, MD;  Location: [facility];  Service: Orthopedics;  Laterality: Left;  . FEMUR IM NAIL Right 06/16/2015   Procedure: INTRAMEDULLARY (IM) NAIL FEMORAL;  Surgeon: Renette Butters, MD;  Location: [facility];  Service: Orthopedics;  Laterality: Right;  . HARDWARE REMOVAL Right 09/24/2020   Procedure: REMOVAL OF IM ROD/HIP SCREW RIGHT HIP;  Surgeon: Mcarthur Rossetti, MD;  Location: [facility];  Service: Orthopedics;  Laterality: Right;  . HIP SURGERY    . JOINT REPLACEMENT    . TOTAL HIP ARTHROPLASTY Right 09/24/2020   Procedure: RIGHT TOTAL HIP ARTHROPLASTY ANTERIOR APPROACH;  Surgeon: Mcarthur Rossetti, MD;  Location: [facility];  Service: Orthopedics;  Laterality: Right;  . TUBAL LIGATION       OB History   No obstetric history on file.     Family History  Problem  Relation Age of Onset  . Heart failure Mother   . Hypertension Father   . Cancer Father   . Stroke Father   . Heart disease Brother   . Alcoholism Brother     Social History   Tobacco Use  . Smoking status: Former Smoker    Packs/day: 0.25    Types: Cigarettes    Quit date: 11/27/2005    Years since quitting: 14.8  . Smokeless tobacco: Never Used  Vaping Use  . Vaping Use: Never used  Substance Use Topics  . Alcohol use: Yes    Alcohol/week: 7.0 standard drinks    Types: 7 Shots of liquor per week    Comment: scotch daily  . Drug use: No    Home Medications Prior to Admission medications   Medication Sig  Start Date End Date Taking? Authorizing Provider  ALPRAZolam (XANAX) 0.25 MG tablet TAKE 1 TABLET(0.25 MG) BY MOUTH TWICE DAILY AS NEEDED FOR ANXIETY 09/14/20   Elby Showers, MD  aspirin 81 MG chewable tablet Chew 1 tablet (81 mg total) by mouth 2 (two) times daily. 09/25/20   Mcarthur Rossetti, MD  b complex vitamins tablet Take 1 tablet by mouth daily.    [provider]  Calcium Citrate-Vitamin D (CALCIUM CITRATE + D PO) Take 2 tablets by mouth at bedtime. Mag    [provider]  cycloSPORINE (RESTASIS) 0.05 % ophthalmic emulsion Place 1 drop into both eyes 2 (two) times daily.     [provider]  estradiol (ESTRACE) 2 MG tablet Take 2 mg by mouth at bedtime.  04/21/19   [provider]  finasteride (PROSCAR) 5 MG tablet Take 2.5 mg by mouth at bedtime.  01/03/19   [provider]  hydrochlorothiazide (HYDRODIURIL) 25 MG tablet Take 1 tablet (25 mg total) by mouth daily. 12/15/19   Elby Showers, MD  Hypromellose, PF, (RETAINE [facility]) 0.3 % SOLN Place 1 drop into both eyes daily as needed (Dry eyes).     [provider]  methocarbamol (ROBAXIN) 500 MG tablet Take 1 tablet (500 mg total) by mouth 3 (three) times daily. 10/08/20   Pete Pelt, PA-C  Multiple Vitamins-Minerals (MULTIVITAMIN WITH MINERALS) tablet Take 1 tablet by mouth daily.    [provider]  olmesartan (BENICAR) 40 MG tablet TAKE 1 TABLET(40 MG) BY MOUTH DAILY Patient taking differently: Take 40 mg by mouth daily.  08/13/20   Elby Showers, MD  Omega-3 Fatty Acids (EQL OMEGA 3 FISH OIL PO) Take 565 mg by mouth daily. Complete    [provider]  oxyCODONE (OXY IR/ROXICODONE) 5 MG immediate release tablet Take 1-2 tablets (5-10 mg total) by mouth every 6 (six) hours as needed for moderate pain (pain score 4-6). 10/05/20   Mcarthur Rossetti, MD  polyethylene glycol [facility] Floria Raveling) packet Take 17 g by mouth 2 (two) times daily. Patient taking  differently: Take 17 g by mouth daily as needed for mild constipation.  05/17/18   Danae Orleans, PA-C  rosuvastatin (CRESTOR) 10 MG tablet Take 1 tablet (10 mg total) by mouth at bedtime. 07/23/20 07/18/21  Elby Showers, MD  Saline (SIMPLY SALINE) 0.9 % AERS Place 1 spray into both nostrils 2 (two) times daily as needed (congestion).     [provider]  zolpidem (AMBIEN) 10 MG tablet TAKE 1 TABLET(10 MG) BY MOUTH AT BEDTIME AS NEEDED FOR SLEEP 09/14/20   Baxley, Cresenciano Lick, MD    Allergies  Patient has no known allergies.  Review of Systems   Review of Systems  Constitutional: Negative for fever.  HENT: Positive for facial swelling. Negative for sore throat.   Eyes: Negative for visual disturbance.  Respiratory: Negative for shortness of breath.   Cardiovascular: Negative for chest pain.  Gastrointestinal: Negative for abdominal pain.  Genitourinary: Negative for dysuria.  Musculoskeletal: Negative for neck pain.  Skin: Positive for wound. Negative for rash.  Neurological: Negative for headaches.    Physical Exam Updated Vital Signs BP 134/68 (BP Location: Left Arm)   Pulse (!) 113   Temp 98.2 F (36.8 C) (Oral)   Resp 18   Ht 5\' 4"  (1.626 m)   Wt 61.7 kg   SpO2 99%   BMI 23.34 kg/m   Physical Exam Vitals and nursing note reviewed.  Constitutional:      General: She is not in acute distress.    Appearance: Normal appearance. She is well-developed.  HENT:     Head: Normocephalic.     Comments: She has some tenderness over her right eye and bruising and tenderness over her right zygoma.  There is a little bit of swelling no obvious crepitus or depression. Eyes:     Conjunctiva/sclera: Conjunctivae normal.  Cardiovascular:     Rate and Rhythm: Normal rate and regular rhythm.     Heart sounds: No murmur heard.   Pulmonary:     Effort: Pulmonary effort is normal. No respiratory distress.     Breath sounds: Normal breath sounds.  Abdominal:     Palpations:  Abdomen is soft.     Tenderness: There is no abdominal tenderness.  Musculoskeletal:        General: Tenderness and signs of injury present.     Cervical back: Neck supple.     Comments: She has 2 surgical incisions at her right hip.  The anterior wound is clean dry and intact.  The lateral 1 has Steri-Strips that are bloodstained and the dressing was bloody.  There was a little bit of clot.  Distal neurovascular intact.  Skin:    General: Skin is warm and dry.     Capillary Refill: Capillary refill takes less than 2 seconds.  Neurological:     General: No focal deficit present.     Mental Status: She is alert.       ED Results / Procedures / Treatments   Labs (all labs ordered are listed, but only abnormal results are displayed) Labs Reviewed - No data to display  EKG None  Radiology DG Hip Unilat W or Wo Pelvis 2-3 Views Right  Result Date: 10/09/2020 CLINICAL DATA:  [place] on RIGHT side EXAM: DG HIP (WITH OR WITHOUT PELVIS) 2-3V RIGHT COMPARISON:  September 24 2020 FINDINGS: Status post bilateral hip arthroplasty. The visualized aspect of the orthopedic hardware is intact and without periprosthetic fracture or lucency. Incomplete visualization of the LEFT orthopedic hardware. Expected alignment of the RIGHT hip arthroplasty. Osteopenia. No acute fracture or dislocation within the limitations of this exam. Mild degenerative changes of the lower lumbar spine. Vascular calcifications. IMPRESSION: 1.  No acute fracture or dislocation. 2. RIGHT hip arthroplasty without evidence of hardware complication. Electronically Signed   By: Valentino Saxon MD   On: 10/09/2020 14:32    Procedures Procedures (including critical care time)  Medications Ordered in ED Medications - No data to display  ED Course  I have reviewed the triage vital signs and the nursing notes.  Pertinent labs & imaging  results that were available during my care of the patient were reviewed by me and considered in  my medical decision making (see chart for details).  Clinical Course as of Oct 10 1032  Sat Oct 09, 2020  1716 Pelvis and right hip interpreted by me as total artificial hip no obvious fracture no dislocation.   [MB]  1761 Discussed with Dr. Ninfa Linden.  He recommends putting on new dry dressings and send her home with some dressing supplies and have her follow-up in the office sometime next week.   [MB]    Clinical Course User Index [MB] Hayden Rasmussen, MD   MDM Rules/Calculators/A&P                          Differential diagnosis includes contusion, hematoma, seroma, fracture, dislocation.  Imaging ordered and interpreted by me as no acute fracture or dislocation.  Discussed case with patient's orthopedic surgeon who recommended new dressings and follow-up in the office.  Patient comfortable with plan. Final Clinical Impression(s) / ED Diagnoses Final diagnoses:  Fall, initial encounter  Contusion of face, initial encounter  Contusion of right hip, initial encounter    Rx / DC Orders ED Discharge Orders    None       Hayden Rasmussen, MD 10/10/20 1035

## 2020-10-09 NOTE — Discharge Instructions (Signed)
You were seen in the emergency department for evaluation of injuries from a fall.  You have some bruising around the right eye and cheek along with some tenderness at your right hip and some bleeding from the surgical wound.  Your x-ray showed that your hardware was in good position and there was no fracture or dislocation.  Dr. Ninfa Linden recommended a clean dressing to be applied and you to change the dressing daily.  Call his office Monday for follow-up.

## 2020-10-09 NOTE — ED Notes (Signed)
Patient's wound dressed using nonstick gauze.

## 2020-10-11 ENCOUNTER — Ambulatory Visit (INDEPENDENT_AMBULATORY_CARE_PROVIDER_SITE_OTHER): Payer: Medicare Other | Admitting: Physician Assistant

## 2020-10-11 ENCOUNTER — Encounter: Payer: Self-pay | Admitting: Physician Assistant

## 2020-10-11 DIAGNOSIS — Z96641 Presence of right artificial hip joint: Secondary | ICD-10-CM

## 2020-10-11 DIAGNOSIS — T8130XA Disruption of wound, unspecified, initial encounter: Secondary | ICD-10-CM

## 2020-10-11 MED ORDER — OXYCODONE HCL 5 MG PO TABS
5.0000 mg | ORAL_TABLET | Freq: Four times a day (QID) | ORAL | 0 refills | Status: DC | PRN
Start: 2020-10-11 — End: 2020-10-19

## 2020-10-11 MED ORDER — DOXYCYCLINE HYCLATE 100 MG PO TABS: 100.0000 mg | ORAL_TABLET | Freq: Two times a day (BID) | ORAL | 0 refills | Status: AC

## 2020-10-11 NOTE — Progress Notes (Signed)
HPI: [name] returns today due to a mechanical fall in which she landed on her right hip and struck her face.  She was seen in the ER.  Radiographs were obtained of the right hip and showed no acute fractures, dislocation or hardware failure involving the right hip.  She is having increased drainage from the right hip since the fall.  Comes in today for reevaluation of the right hip.  Denies any fevers or chills.  Physical exam right hip surgical incisions most distal surgical incision proximal portion of the incision there is some bloody serosanguineous drainage from this area there is no signs of infection.  The remainder of the incisions are all healing well no drainage.  There is no seroma.  She has good range of motion of the right hip calf supple nontender.  She is able to bear weight on the right hip.   Impression: Status post right total hip arthroplasty Wound dehiscence  Plan: Proximal incision is stapled today and Xeroform applied.  Aquacel was placed over the incision.  She will leave this on until follow-up on November 24.  If the dressing becomes saturated or has to be changed she is given supplies including ABD and Hypafix tape and instructed on how to change the dressing.  Place her on empirical doxycycline.  Refill on her oxycodone was given.

## 2020-10-13 ENCOUNTER — Telehealth: Payer: Self-pay

## 2020-10-13 ENCOUNTER — Other Ambulatory Visit: Payer: Self-pay | Admitting: Internal Medicine

## 2020-10-13 NOTE — Telephone Encounter (Signed)
Patient called stating that she was still having some bleeding from her incision on her right hip. Talked with Erskine Emery and he advised for patient to continue to change dressing until her F/U appointment on next week.    Advised patient of a message per Erskine Emery and patient voiced that she understands.

## 2020-10-14 ENCOUNTER — Encounter: Payer: Self-pay | Admitting: Physician Assistant

## 2020-10-14 ENCOUNTER — Ambulatory Visit (INDEPENDENT_AMBULATORY_CARE_PROVIDER_SITE_OTHER): Payer: Medicare Other | Admitting: Physician Assistant

## 2020-10-14 DIAGNOSIS — Z96641 Presence of right artificial hip joint: Secondary | ICD-10-CM

## 2020-10-14 DIAGNOSIS — T8130XA Disruption of wound, unspecified, initial encounter: Secondary | ICD-10-CM

## 2020-10-14 NOTE — Progress Notes (Signed)
HPI: Ms. Manzella returns today due to drainage from her right hip.  She states she is continuing to have bloody drainage from the hip wound.  She has had no fevers or chills.  Physical exam right hip most anterior most proximal incisions well-healed.  Still a small area of the most distal lateral wound is having some bloody drainage there is no frank bleeding.  I am unable to express any drainage or bleeding from the incision site.  Most of the incision is healed at this point time there is one area where one of the staples is pulled through the skin.  The staples applied.  Pressure bandage is applied.   Plan: We will see her back next Thursday to check for wound.  She will continue her doxycycline.  Questions encouraged and answered.

## 2020-10-15 ENCOUNTER — Telehealth: Payer: Self-pay | Admitting: Internal Medicine

## 2020-10-15 MED ORDER — ZOLPIDEM TARTRATE 10 MG PO TABS
ORAL_TABLET | ORAL | 0 refills | Status: DC
Start: 2020-10-15 — End: 2020-10-15

## 2020-10-15 MED ORDER — ZOLPIDEM TARTRATE 10 MG PO TABS
ORAL_TABLET | ORAL | 0 refills | Status: DC
Start: 2020-10-15 — End: 2020-11-15

## 2020-10-15 NOTE — Telephone Encounter (Signed)
Shanitha Twining [phone]  [name] called to check on her refill on below medication.  zolpidem (AMBIEN) 10 MG tablet  [id number] - [place], [address] Phone:  [phone]  Fax:  [phone]

## 2020-10-15 NOTE — Telephone Encounter (Signed)
RX is due today please sign.

## 2020-10-15 NOTE — Telephone Encounter (Signed)
Ambien refilled x 30 days. MJB,MD

## 2020-10-19 ENCOUNTER — Telehealth: Payer: Self-pay | Admitting: Orthopaedic Surgery

## 2020-10-19 MED ORDER — OXYCODONE HCL 5 MG PO TABS
5.0000 mg | ORAL_TABLET | Freq: Four times a day (QID) | ORAL | 0 refills | Status: DC | PRN
Start: 2020-10-19 — End: 2020-10-26

## 2020-10-19 NOTE — Telephone Encounter (Signed)
Pt would like a refill of her oxycodone rx please  [phone]

## 2020-10-20 ENCOUNTER — Ambulatory Visit (INDEPENDENT_AMBULATORY_CARE_PROVIDER_SITE_OTHER): Payer: Medicare Other | Admitting: Physician Assistant

## 2020-10-20 ENCOUNTER — Encounter: Payer: Self-pay | Admitting: Physician Assistant

## 2020-10-20 DIAGNOSIS — T8130XA Disruption of wound, unspecified, initial encounter: Secondary | ICD-10-CM

## 2020-10-20 DIAGNOSIS — Z96641 Presence of right artificial hip joint: Secondary | ICD-10-CM

## 2020-10-20 NOTE — Progress Notes (Signed)
HPI: Jeanne Smith comes in today for wound check.  She states overall her breathing is improving.  Physical exam: Right hip anterior incision is well-healed.  The proximal lateral incision is well-healed.  The distal incision shows continued healing with just a small area of opening but no active drainage.  There is no signs of infection.  Impression: Status post right total hip arthroplasty Wound dehiscence  Plan: Staples removed.  Steri-Strips applied.  She can wash the area with antibacterial soap daily.  We will see her back in 3 weeks to check healing sooner if there is any questions concerns.

## 2020-10-25 ENCOUNTER — Ambulatory Visit: Payer: Medicare Other | Admitting: Physician Assistant

## 2020-10-26 ENCOUNTER — Telehealth: Payer: Self-pay

## 2020-10-26 ENCOUNTER — Other Ambulatory Visit: Payer: Self-pay | Admitting: Orthopaedic Surgery

## 2020-10-26 MED ORDER — OXYCODONE HCL 5 MG PO TABS: 5.0000 mg | ORAL_TABLET | Freq: Four times a day (QID) | ORAL | 0 refills | Status: DC | PRN

## 2020-10-26 NOTE — Telephone Encounter (Signed)
I sent in some pain meds.  We need to see her in clinic tomorrow to check it out.

## 2020-10-26 NOTE — Telephone Encounter (Signed)
Patient called stating that she is having drainage from her right hip and that it very painful.  Stated that she is having trouble walking.  Would like a Rx refill on Oxycodone?  Cb# [phone].  Please advise.  Thank you.

## 2020-10-26 NOTE — Telephone Encounter (Signed)
Please advise 

## 2020-10-26 NOTE — Telephone Encounter (Signed)
Appt made for tomorrow.

## 2020-10-27 ENCOUNTER — Encounter: Payer: Self-pay | Admitting: Orthopaedic Surgery

## 2020-10-27 ENCOUNTER — Ambulatory Visit (INDEPENDENT_AMBULATORY_CARE_PROVIDER_SITE_OTHER): Payer: Medicare Other | Admitting: Orthopaedic Surgery

## 2020-10-27 ENCOUNTER — Telehealth: Payer: Self-pay | Admitting: *Deleted

## 2020-10-27 DIAGNOSIS — Z96641 Presence of right artificial hip joint: Secondary | ICD-10-CM

## 2020-10-27 MED ORDER — DOXYCYCLINE HYCLATE 100 MG PO TABS: 100.0000 mg | ORAL_TABLET | Freq: Two times a day (BID) | ORAL | 0 refills | Status: DC

## 2020-10-27 NOTE — Telephone Encounter (Signed)
Ortho bundle 30 day in office visit completed. 

## 2020-10-27 NOTE — Progress Notes (Signed)
The patient is a month out from extensive right hip surgery.  We had to remove an intramedullary rod and hip screws from the previous intertrochanteric fracture that was fixed in 2016 by one of my colleagues in town.  She then developed debilitating arthritis in the right hip.  We took out the hardware and then performed a direct anterior hip replacement.  She is developed some drainage on the lateral hip wound from the hardware removal.  She denies any fever, chills, nausea, vomiting.  She is been keeping this area clean.  On exam I was able to open up a small pinpoint area and drained a seroma from this area.  There is no purulence at all.  There is no significant induration of the skin or redness.  I let her know this would probably drain for a while and I want her to clean it daily with antibacterial soapy water.  I will have her on doxycycline and would like to see her in a follow-up visit in just 5 days.  I would have a low threshold of washing this out if it needs to be taken to the operating room with [place] for does not seem to need that.  We will continue to follow her closely.

## 2020-10-30 DIAGNOSIS — Z471 Aftercare following joint replacement surgery: Secondary | ICD-10-CM | POA: Diagnosis not present

## 2020-11-01 ENCOUNTER — Encounter: Payer: Self-pay | Admitting: Orthopaedic Surgery

## 2020-11-01 ENCOUNTER — Ambulatory Visit (INDEPENDENT_AMBULATORY_CARE_PROVIDER_SITE_OTHER): Payer: Medicare Other | Admitting: Orthopaedic Surgery

## 2020-11-01 DIAGNOSIS — Z96641 Presence of right artificial hip joint: Secondary | ICD-10-CM

## 2020-11-01 DIAGNOSIS — T8130XA Disruption of wound, unspecified, initial encounter: Secondary | ICD-10-CM

## 2020-11-01 DIAGNOSIS — Z23 Encounter for immunization: Secondary | ICD-10-CM | POA: Diagnosis not present

## 2020-11-01 MED ORDER — OXYCODONE HCL 5 MG PO TABS
5.0000 mg | ORAL_TABLET | Freq: Four times a day (QID) | ORAL | 0 refills | Status: DC | PRN
Start: 2020-11-01 — End: 2021-01-17

## 2020-11-01 NOTE — Progress Notes (Signed)
The patient is seen in postop follow-up status post a complicated surgery which we had removed significant retained hardware from her right hip and placed a hip replacement. She has had some breakdown of her lateral incision and not the hip replacement incision. There is been some drainage but no purulence. She is walking now without assistive device and her pain is gotten much less. She is not sickly appearing. She denies any fever and chills.  On examination of her right hip incision it does look much improved from last week. I was able to express some more serous fluid from this area that does not appear to be infected. I will still have her treat this with local wound care. She will continue her doxycycline. I would like to see her back in 1 week to make sure this is continuing to improve. Overall she feels much better.

## 2020-11-04 ENCOUNTER — Other Ambulatory Visit: Payer: Medicare Other | Admitting: Internal Medicine

## 2020-11-08 ENCOUNTER — Encounter: Payer: Medicare Other | Admitting: Internal Medicine

## 2020-11-10 ENCOUNTER — Ambulatory Visit (INDEPENDENT_AMBULATORY_CARE_PROVIDER_SITE_OTHER): Payer: Medicare Other | Admitting: Orthopaedic Surgery

## 2020-11-10 ENCOUNTER — Ambulatory Visit: Payer: Medicare Other | Admitting: Physician Assistant

## 2020-11-10 ENCOUNTER — Encounter: Payer: Self-pay | Admitting: Orthopaedic Surgery

## 2020-11-10 DIAGNOSIS — T8130XA Disruption of wound, unspecified, initial encounter: Secondary | ICD-10-CM

## 2020-11-10 DIAGNOSIS — Z96641 Presence of right artificial hip joint: Secondary | ICD-10-CM

## 2020-11-10 NOTE — Progress Notes (Signed)
The patient is following up after having extensive surgery on the right hip.  We had removed a hip rod and screw from an old intertrochanteric fracture.  She had severe posttraumatic arthritis and we performed an anterior hip replacement surgery.  She did have some breakdown of her lateral hip wound not from the hip replacement itself or from the hardware removal.  She is now working on local wound care.  She is doing much better overall she states.  She is walking without an assistive device.  I assessed the wound itself and it looks really good.  There is only small area that has minimal drainage.  There is no evidence of infection.  She is not having any significant pain but she is still requiring some narcotics.  She had been on narcotics for a while before surgery.  At this point we will have her continue local wound care.  We can stretch out her next visit for 4 weeks.  If there is any issues before then she will let us know.  No x-rays are needed in 4 weeks unless there are issues.

## 2020-11-15 ENCOUNTER — Other Ambulatory Visit: Payer: Self-pay

## 2020-11-15 ENCOUNTER — Other Ambulatory Visit: Payer: Self-pay | Admitting: Internal Medicine

## 2020-11-15 MED ORDER — ZOLPIDEM TARTRATE 10 MG PO TABS
ORAL_TABLET | ORAL | 0 refills | Status: DC
Start: 2020-11-15 — End: 2020-12-22

## 2020-11-15 MED ORDER — ALPRAZOLAM 0.25 MG PO TABS
ORAL_TABLET | ORAL | 0 refills | Status: DC
Start: 2020-11-15 — End: 2021-01-17

## 2020-11-15 NOTE — Telephone Encounter (Signed)
Patient called to get a refill on AMBIEN last refill 10/15/20. ALPRAZOLAM LAST REFILL 09/14/20

## 2020-11-17 ENCOUNTER — Other Ambulatory Visit: Payer: Self-pay | Admitting: Internal Medicine

## 2020-12-01 ENCOUNTER — Other Ambulatory Visit: Payer: Self-pay | Admitting: Internal Medicine

## 2020-12-06 ENCOUNTER — Other Ambulatory Visit: Payer: Medicare Other | Admitting: Internal Medicine

## 2020-12-06 ENCOUNTER — Ambulatory Visit: Payer: Medicare Other | Admitting: Internal Medicine

## 2020-12-08 ENCOUNTER — Ambulatory Visit: Payer: Medicare Other | Admitting: Orthopaedic Surgery

## 2020-12-11 IMAGING — US US ABDOMEN LIMITED
1 series · 14 of 25 positions shown · non-contrast
Comparison: None.

CLINICAL DATA: Elevated liver enzymes.

EXAM:
ULTRASOUND ABDOMEN LIMITED RIGHT UPPER QUADRANT

[Series 1: us abdomen limited · 0.25mm/px · 14 of 72 slices shown]
[im 1/72]
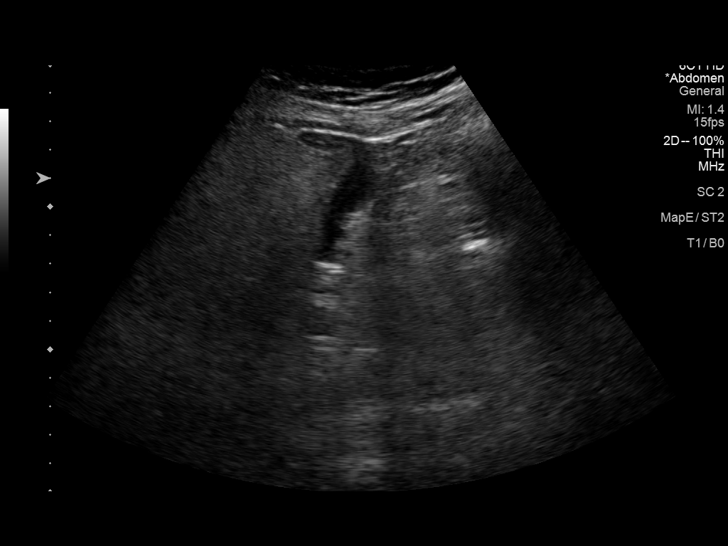
[im 6/72]
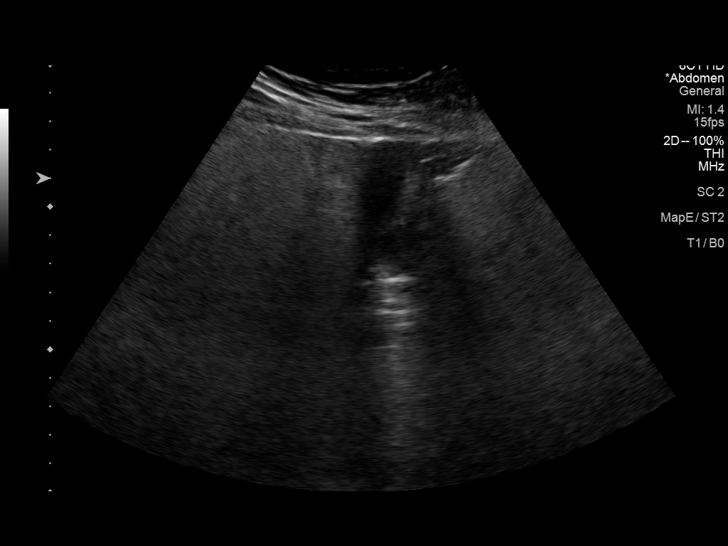
[im 12/72]
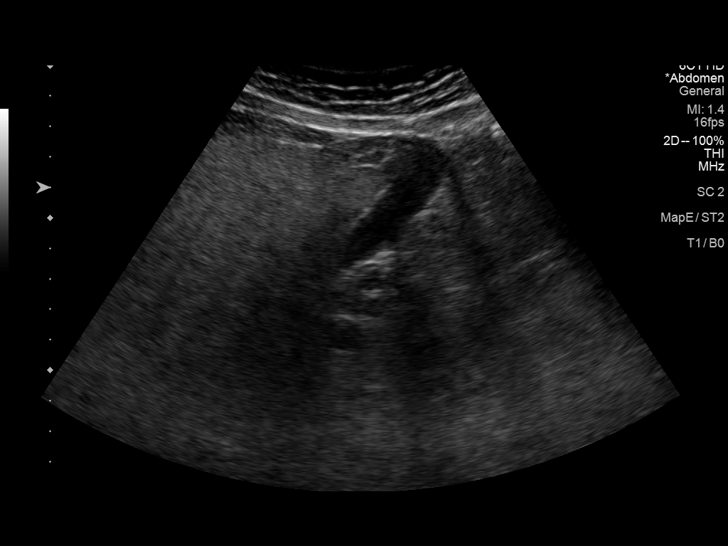
[im 18/72]
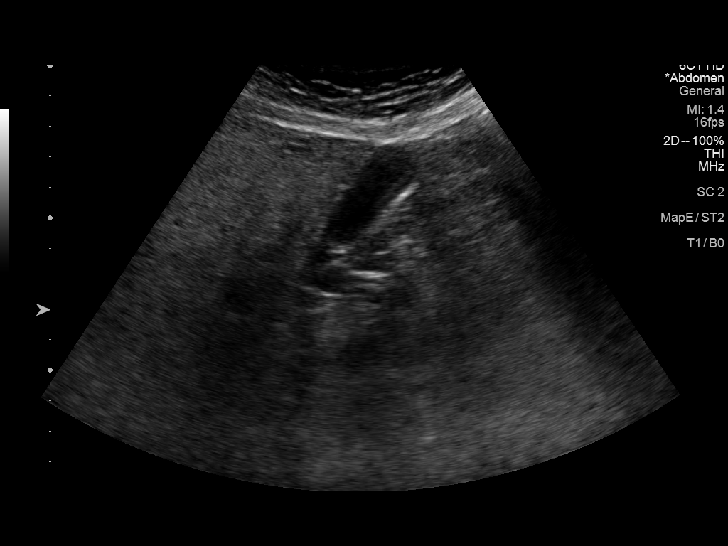
[im 24/72]
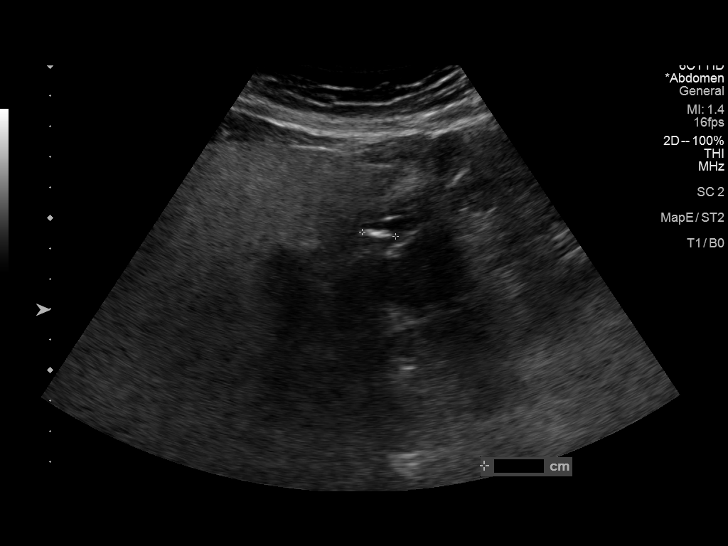
[im 27/72]
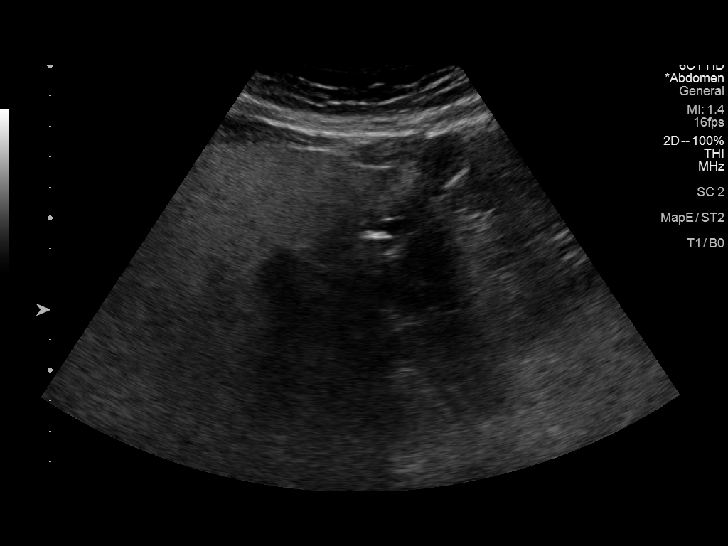
[im 33/72]
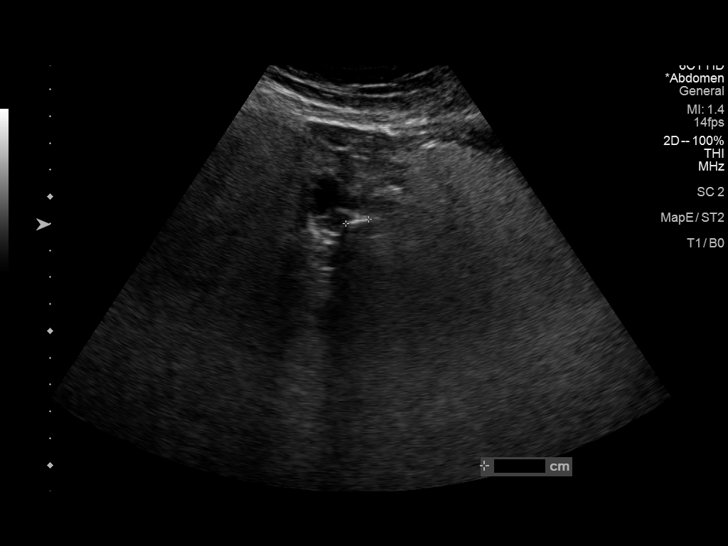
[im 39/72]
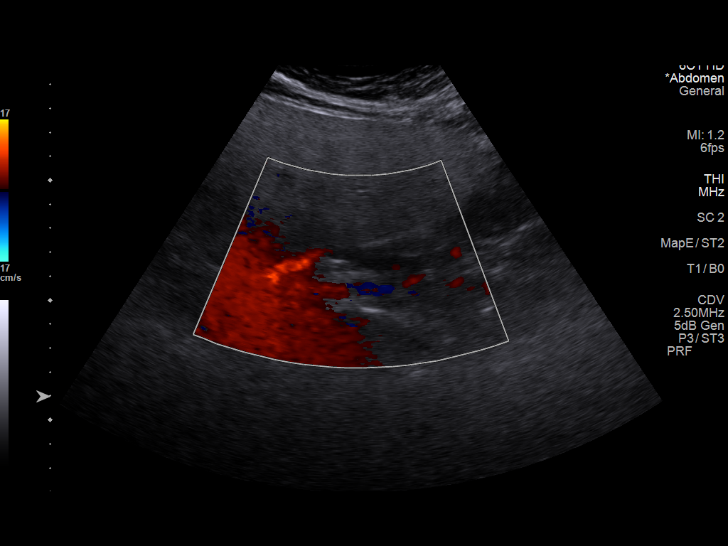
[im 45/72]
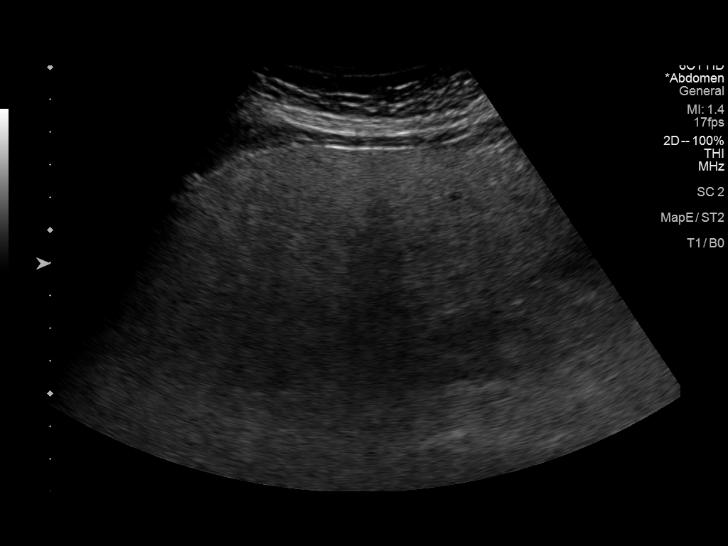
[im 48/72]
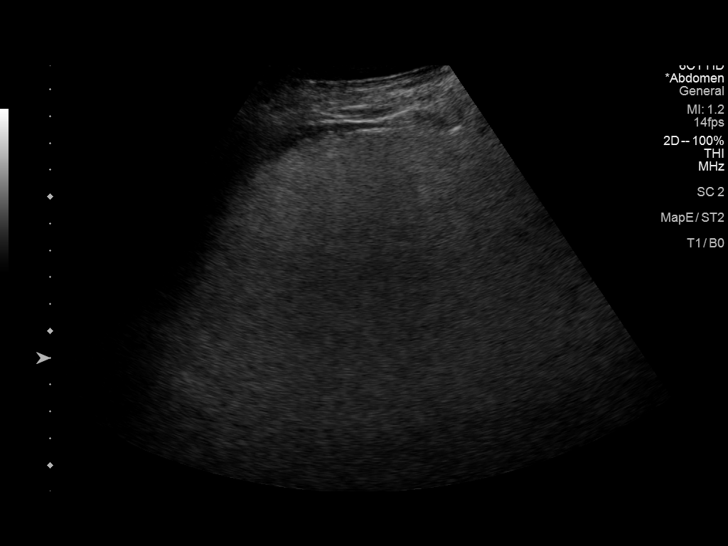
[im 54/72]
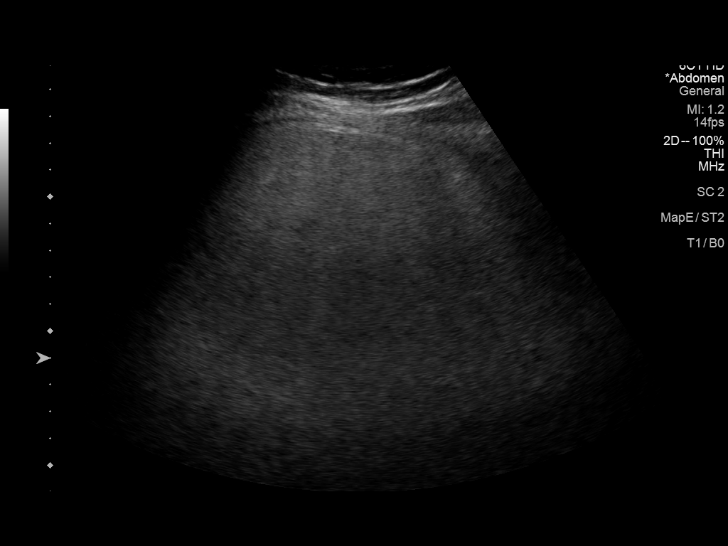
[im 60/72]
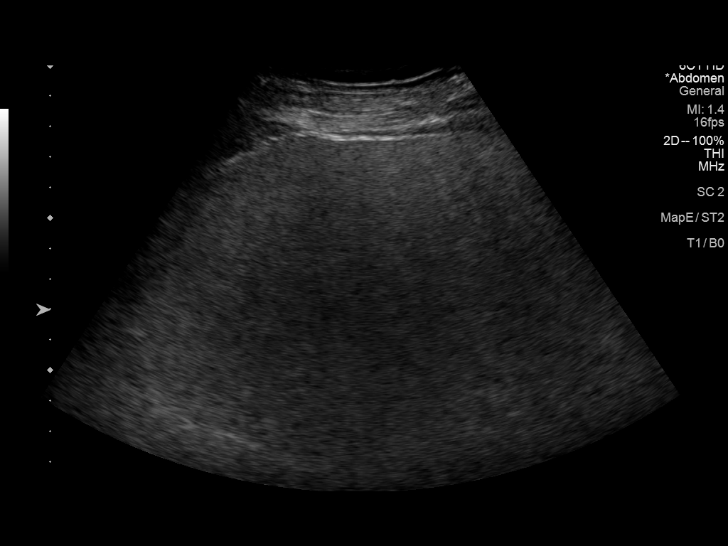
[im 66/72]
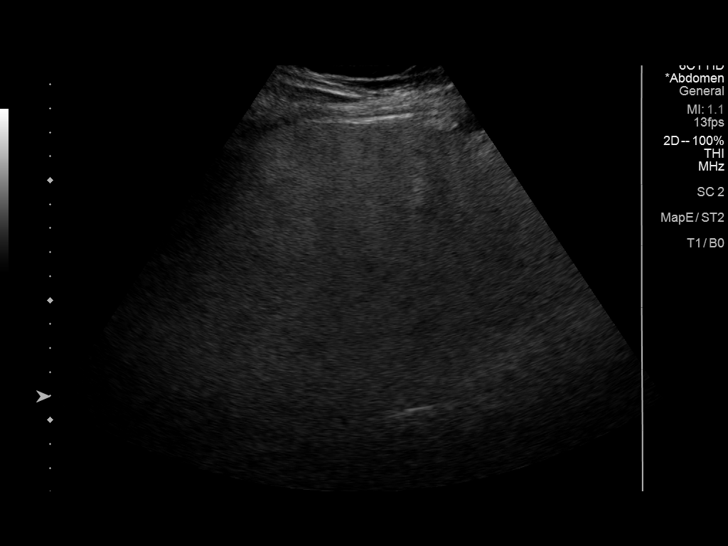
[im 72/72]
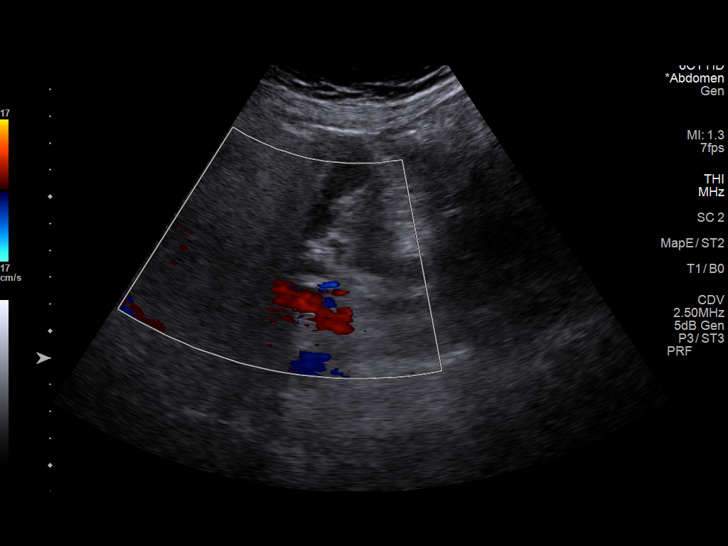

[14 of 25 positions shown; findings below may reference images not displayed]

FINDINGS: Gallbladder:

The gallbladder is partially contracted. Evaluation of the
gallbladder is limited due to body habitus. Several linear echogenic
foci within the gallbladder measuring up to 11 mm likely represent
gallstone. There is a shadowing stone in the neck of the gallbladder
or within the cystic duct. No gallbladder wall thickening or
pericholecystic fluid. Negative sonographic Murphy's sign.

Common bile duct:

Diameter: 8 mm

Liver:

There is diffuse increased liver echogenicity most commonly seen in
the setting of fatty infiltration. Superimposed inflammation or
fibrosis is not excluded. Clinical correlation is recommended.
Portal vein is patent on color Doppler imaging with normal direction
of blood flow towards the liver.

Other: None.
IMPRESSION: 1. Cholelithiasis without sonographic evidence of acute
cholecystitis. A hepatobiliary scintigraphy may provide better
evaluation of the gallbladder if there is a high clinical concern
for acute cholecystitis .
2. Fatty liver.

## 2020-12-16 ENCOUNTER — Encounter: Payer: Self-pay | Admitting: Orthopaedic Surgery

## 2020-12-16 ENCOUNTER — Ambulatory Visit (INDEPENDENT_AMBULATORY_CARE_PROVIDER_SITE_OTHER): Payer: Medicare Other

## 2020-12-16 ENCOUNTER — Ambulatory Visit (INDEPENDENT_AMBULATORY_CARE_PROVIDER_SITE_OTHER): Payer: Medicare Other | Admitting: Orthopaedic Surgery

## 2020-12-16 DIAGNOSIS — Z96641 Presence of right artificial hip joint: Secondary | ICD-10-CM

## 2020-12-16 MED ORDER — MELOXICAM 15 MG PO TABS: 15.0000 mg | ORAL_TABLET | Freq: Every day | ORAL | 3 refills | Status: DC

## 2020-12-16 MED ORDER — TRAMADOL HCL 50 MG PO TABS
100.0000 mg | ORAL_TABLET | Freq: Four times a day (QID) | ORAL | 0 refills | Status: DC | PRN
Start: 2020-12-16 — End: 2021-01-17

## 2020-12-16 MED ORDER — TIZANIDINE HCL 4 MG PO TABS: 4.0000 mg | ORAL_TABLET | Freq: Three times a day (TID) | ORAL | 1 refills | Status: DC | PRN

## 2020-12-16 NOTE — Progress Notes (Signed)
The patient is getting close to 82-month status post removal of hardware from her right hip and conversion to a total hip arthroplasty.  She had originally had intertrochanteric fracture and then developed severe end-stage arthritis of the right hip.  The surgery was quite extensive.  She still having a significant mount of pain.  She was on a significant amount of narcotics prior to surgery due to the debilitating pain and the fact that we had to delay her surgery due to the COVID-19 pandemic.  She is walking without assistive device.  Most of her pain is significant at night.  On exam she does tolerate me putting her hip through range of motion.  Her incisions have all healed.  Most of her pain is over the trochanteric area and not in the groin itself.  AP pelvis and lateral of the right hip shows a well-seated total hip arthroplasty with no complicating features.  I am going to try a combination of tramadol, meloxicam and Zanaflex and see if this will help.  Also, I gave her reassurance that with time this should slowly improve.  I would like to see her back in 4 weeks to see how she is doing overall but no x-rays are needed.

## 2020-12-17 ENCOUNTER — Other Ambulatory Visit: Payer: Self-pay | Admitting: Internal Medicine

## 2020-12-17 NOTE — Telephone Encounter (Signed)
Left message to call back to book CPE 

## 2020-12-17 NOTE — Telephone Encounter (Signed)
Jeanne Smith [phone]  [name] called and said the pharmacy told her to contact us for a refill on below medication. I let her know she was to contact them and they would send this to [place] electronically, that this is not a medication that the patient has to call the office for.  Surgery 09/24/2020  No OV  Or CPE scheduled at this time.  Last OV 06/03/2020  Canceled CPE due 11/02/2020      zolpidem (AMBIEN) 10 MG tablet  [id number] - [place], [place] - [address] Phone:  [phone]  Fax:  [phone]

## 2020-12-17 NOTE — Telephone Encounter (Signed)
Needs CPE booked before refilling

## 2020-12-19 ENCOUNTER — Encounter (HOSPITAL_COMMUNITY): Payer: Self-pay | Admitting: Emergency Medicine

## 2020-12-19 ENCOUNTER — Other Ambulatory Visit: Payer: Self-pay

## 2020-12-19 ENCOUNTER — Emergency Department (HOSPITAL_COMMUNITY)
Admission: EM | Admit: 2020-12-19 | Discharge: 2020-12-20 | Disposition: A | Discharge status: Home / Self Care | Payer: Medicare Other | Attending: Emergency Medicine | Admitting: Emergency Medicine

## 2020-12-19 ENCOUNTER — Emergency Department (HOSPITAL_COMMUNITY): Payer: Medicare Other

## 2020-12-19 DIAGNOSIS — E039 Hypothyroidism, unspecified: Secondary | ICD-10-CM | POA: Insufficient documentation

## 2020-12-19 DIAGNOSIS — Z79899 Other long term (current) drug therapy: Secondary | ICD-10-CM | POA: Diagnosis not present

## 2020-12-19 DIAGNOSIS — W228XXA Striking against or struck by other objects, initial encounter: Secondary | ICD-10-CM | POA: Diagnosis not present

## 2020-12-19 DIAGNOSIS — Z96641 Presence of right artificial hip joint: Secondary | ICD-10-CM | POA: Diagnosis not present

## 2020-12-19 DIAGNOSIS — Z87891 Personal history of nicotine dependence: Secondary | ICD-10-CM | POA: Insufficient documentation

## 2020-12-19 DIAGNOSIS — I10 Essential (primary) hypertension: Secondary | ICD-10-CM | POA: Diagnosis not present

## 2020-12-19 DIAGNOSIS — Z7982 Long term (current) use of aspirin: Secondary | ICD-10-CM | POA: Diagnosis not present

## 2020-12-19 DIAGNOSIS — S59291A Other physeal fracture of lower end of radius, right arm, initial encounter for closed fracture: Secondary | ICD-10-CM | POA: Diagnosis not present

## 2020-12-19 DIAGNOSIS — Y9301 Activity, walking, marching and hiking: Secondary | ICD-10-CM | POA: Diagnosis not present

## 2020-12-19 DIAGNOSIS — S52321A Displaced transverse fracture of shaft of right radius, initial encounter for closed fracture: Secondary | ICD-10-CM | POA: Diagnosis not present

## 2020-12-19 DIAGNOSIS — S6991XA Unspecified injury of right wrist, hand and finger(s), initial encounter: Secondary | ICD-10-CM | POA: Diagnosis present

## 2020-12-19 MED ORDER — FENTANYL CITRATE (PF) 100 MCG/2ML IJ SOLN: 50.0000 ug | Freq: Once | INTRAMUSCULAR | Status: DC

## 2020-12-19 MED ORDER — OXYCODONE-ACETAMINOPHEN 5-325 MG PO TABS
1.0000 | ORAL_TABLET | Freq: Once | ORAL | Status: AC
  Administered 2020-12-19: 1 via ORAL
  Filled 2020-12-19: qty 1

## 2020-12-19 NOTE — ED Provider Notes (Signed)
[facility] EMERGENCY DEPARTMENT Provider Note   CSN: [id number] Arrival date & time: 12/19/20  1443     History Chief Complaint  Patient presents with  . Arm Pain    Jeanne Smith is a 70 y.o. female with a history of osteopenia, hypertension, osteoarthritis, stenosing tenosynovitis of right wrist. Patient presents with a chief complaint of right wrist pain, pain started after an injury last night, pain has been constant, pain is worse with movement 7/10 on the pain scale, pain improves when arm is kept still in a neutral position. Pain is associated with swelling of her right wrist.    Patient reports that last night sometime after 21:30 patient "whacked," her right arm on a doorknob in her house. Patient reports that she started having pain in swelling in the right wrist. Patient denies any falls. No head neck or back pain. Patient reports she had previous surgery on the affected wrist performed by Dr. Joni [facility] orthopedics.    Patient denies any numbness or tingling in extremities, weakness in extremities, loss of sensation in right hand, head, neck, or back pain.  Patient is right arm dominant.  Per chart review in Feb 2020 patient had open reduction internal fixation of a displaced right distal radius fracture surgery.      Arm Pain       Past Medical History:  Diagnosis Date  . Anxiety   . Arthritis    oa  . Attention deficit disorder   . Dysrhythmia 2019   episode of palpitations x 25 January 2018, stress test normal  . H/O seasonal allergies   . Hyperlipidemia   . Hypertension   . Hypothyroidism    thyroid nodules, off synthroid > 15 years  . Insomnia   . Osteoarthritis   . Osteopenia   . Thyroid nodule     Patient Active Problem List   Diagnosis Date Noted  . 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  . Unilateral primary osteoarthritis, right hip 06/15/2020  . Retained orthopedic hardware 06/15/2020  .  Stenosing tenosynovitis of wrist 04/01/2019  . Closed fracture of right wrist 01/29/2019  . Pain in right wrist 01/21/2019  . Pain in right hand 01/21/2019  . 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 S/P hip replacement 05/16/2018  . Knee pain 03/28/2018  . Pure hypercholesterolemia 12/24/2017  . Intertrochanteric fracture of right femur ([place]) 06/15/2015  . Comminuted right humeral fracture 06/15/2015  . HTN (hypertension) 06/15/2015    Past Surgical History:  Procedure Laterality Date  . ABDOMINAL HYSTERECTOMY    . AUGMENTATION MAMMAPLASTY Bilateral    silicone over 30 years  . CONVERSION TO TOTAL HIP Left 05/16/2018   Procedure: LEFT HIP ANTERIOR APPROACH;  Surgeon: Paralee Cancel, MD;  Location: [facility];  Service: Orthopedics;  Laterality: Left;  . FEMUR IM NAIL Right 06/16/2015   Procedure: INTRAMEDULLARY (IM) NAIL FEMORAL;  Surgeon: Renette Butters, MD;  Location: [facility];  Service: Orthopedics;  Laterality: Right;  . HARDWARE REMOVAL Right 09/24/2020   Procedure: REMOVAL OF IM ROD/HIP SCREW RIGHT HIP;  Surgeon: Mcarthur Rossetti, MD;  Location: [facility];  Service: Orthopedics;  Laterality: Right;  . HIP SURGERY    . JOINT REPLACEMENT    . TOTAL HIP ARTHROPLASTY Right 09/24/2020   Procedure: RIGHT TOTAL HIP ARTHROPLASTY ANTERIOR APPROACH;  Surgeon: Mcarthur Rossetti, MD;  Location: [facility];  Service: Orthopedics;  Laterality: Right;  . TUBAL LIGATION  OB History   No obstetric history on file.     Family History  Problem Relation Age of Onset  . Heart failure Mother   . Hypertension Father   . Cancer Father   . Stroke Father   . Heart disease Brother   . Alcoholism Brother     Social History   Tobacco Use  . Smoking status: Former Smoker    Packs/day: 0.25    Types: Cigarettes    Quit date: 11/27/2005    Years since quitting: 15.0  . Smokeless tobacco: Never Used  Vaping Use  . Vaping Use: Never used  Substance Use Topics  . Alcohol use: Yes     Alcohol/week: 7.0 standard drinks    Types: 7 Shots of liquor per week    Comment: scotch daily  . Drug use: No    Home Medications Prior to Admission medications   Medication Sig Start Date End Date Taking? Authorizing Provider  ALPRAZolam (XANAX) 0.25 MG tablet TAKE 1 TABLET(0.25 MG) BY MOUTH TWICE DAILY AS NEEDED FOR ANXIETY Patient taking differently: Take 0.25 mg by mouth 2 (two) times daily as needed for anxiety. 11/15/20  Yes Baxley, Cresenciano Lick, MD  aspirin 81 MG chewable tablet Chew 1 tablet (81 mg total) by mouth 2 (two) times daily. 09/25/20  Yes Mcarthur Rossetti, MD  b complex vitamins tablet Take 1 tablet by mouth daily.   Yes [provider]  Calcium Citrate-Vitamin D (CALCIUM CITRATE + D PO) Take 2 tablets by mouth at bedtime. Mag   Yes [provider]  cycloSPORINE (RESTASIS) 0.05 % ophthalmic emulsion Place 1 drop into both eyes 2 (two) times daily.    Yes [provider]  estradiol (ESTRACE) 2 MG tablet Take 2 mg by mouth at bedtime.  04/21/19  Yes [provider]  finasteride (PROSCAR) 5 MG tablet Take 2.5 mg by mouth at bedtime.  01/03/19  Yes [provider]  hydrochlorothiazide (HYDRODIURIL) 25 MG tablet TAKE 1 TABLET(25 MG) BY MOUTH DAILY Patient taking differently: Take 25 mg by mouth daily. 12/01/20  Yes Baxley, Cresenciano Lick, MD  Hypromellose, PF, (RETAINE HPMC) 0.3 % SOLN Place 1 drop into both eyes daily as needed (Dry eyes).    Yes [provider]  Magnesium 400 MG TABS Take 400 mg by mouth at bedtime.   Yes [provider]  meloxicam (MOBIC) 15 MG tablet Take 1 tablet (15 mg total) by mouth daily. 12/16/20  Yes Mcarthur Rossetti, MD  methocarbamol (ROBAXIN) 500 MG tablet Take 1 tablet (500 mg total) by mouth 3 (three) times daily. 10/08/20  Yes Pete Pelt, PA-C  Multiple Vitamins-Minerals (MULTIVITAMIN WITH MINERALS) tablet Take 1 tablet by mouth daily.   Yes [provider]  olmesartan  (BENICAR) 40 MG tablet TAKE 1 TABLET(40 MG) BY MOUTH DAILY Patient taking differently: Take 40 mg by mouth daily. 11/17/20  Yes Baxley, Cresenciano Lick, MD  Omega-3 Fatty Acids (EQL OMEGA 3 FISH OIL PO) Take 565 mg by mouth daily. Complete   Yes [provider]  oxyCODONE (OXY IR/ROXICODONE) 5 MG immediate release tablet Take 1-2 tablets (5-10 mg total) by mouth every 6 (six) hours as needed for moderate pain (pain score 4-6). 11/01/20  Yes Mcarthur Rossetti, MD  oxyCODONE-acetaminophen (PERCOCET/ROXICET) 5-325 MG tablet Take 1 tablet by mouth every 6 (six) hours as needed for up to 3 days for severe pain. 12/19/20 12/22/20 Yes Bing Duffey, Rudell Cobb, PA-C  polyethylene glycol (MIRALAX / Floria Raveling)  packet Take 17 g by mouth 2 (two) times daily. Patient taking differently: Take 17 g by mouth daily as needed for mild constipation. 05/17/18  Yes Babish, Rodman Key, PA-C  rosuvastatin (CRESTOR) 10 MG tablet Take 1 tablet (10 mg total) by mouth at bedtime. 07/23/20 07/18/21 Yes Baxley, Cresenciano Lick, MD  Saline 0.9 % AERS Place 1 spray into both nostrils 2 (two) times daily as needed (congestion).    Yes [provider]  tiZANidine (ZANAFLEX) 4 MG tablet Take 1 tablet (4 mg total) by mouth every 8 (eight) hours as needed for muscle spasms. 12/16/20  Yes Mcarthur Rossetti, MD  traMADol (ULTRAM) 50 MG tablet Take 2 tablets (100 mg total) by mouth every 6 (six) hours as needed. Patient taking differently: Take 100 mg by mouth every 6 (six) hours as needed for moderate pain. 12/16/20  Yes Mcarthur Rossetti, MD  zolpidem (AMBIEN) 10 MG tablet TAKE 1 TABLET(10 MG) BY MOUTH AT BEDTIME AS NEEDED FOR SLEEP Patient taking differently: Take 10 mg by mouth at bedtime as needed for sleep. 11/15/20  Yes Baxley, Cresenciano Lick, MD  doxycycline (VIBRA-TABS) 100 MG tablet Take 1 tablet (100 mg total) by mouth 2 (two) times daily. Patient not taking: Reported on 12/19/2020 10/27/20   Mcarthur Rossetti, MD    Allergies     Patient has no known allergies.  Review of Systems   Review of Systems  Musculoskeletal: Positive for arthralgias. Negative for back pain and neck pain.  Skin: Positive for color change (brusing to right wrist).  Neurological: Negative for seizures, weakness and numbness.    Physical Exam Updated Vital Signs BP 126/84 (BP Location: Right Arm)   Pulse 71   Temp 98.2 F (36.8 C) (Oral)   Resp 18   SpO2 98%   Physical Exam Vitals and nursing note reviewed.  Constitutional:      General: She is not in acute distress.    Appearance: She is not ill-appearing, toxic-appearing or diaphoretic.  HENT:     Head: Normocephalic.  Eyes:     General: No scleral icterus.       Right eye: No discharge.        Left eye: No discharge.  Cardiovascular:     Rate and Rhythm: Normal rate.  Pulmonary:     Effort: Pulmonary effort is normal.  Musculoskeletal:     Right shoulder: No swelling, deformity, tenderness or bony tenderness.     Right upper arm: No swelling, edema, deformity, tenderness or bony tenderness.     Right elbow: No swelling or deformity. Normal range of motion. No tenderness.     Right forearm: No swelling, deformity, tenderness or bony tenderness.     Right wrist: Swelling, deformity, tenderness and bony tenderness present. Decreased range of motion. Normal pulse.     Right hand: No swelling, deformity, tenderness or bony tenderness. Normal range of motion. Normal strength. Normal sensation. Normal capillary refill.     Comments: Right wrist: Extensive swelling to volar wrist, bruising noted to the volar wrist, obvious deformity noted, decreased range of motion  Skin:    General: Skin is warm and dry.  Neurological:     General: No focal deficit present.     Mental Status: She is alert.  Psychiatric:        Behavior: Behavior is cooperative.     ED Results / Procedures / Treatments   Labs (all labs ordered are listed, but only abnormal results are displayed) Labs  Reviewed - No data to display  EKG None  Radiology DG Forearm Right  Result Date: 12/19/2020 CLINICAL DATA:  Trauma, right forearm pain EXAM: RIGHT FOREARM - 2 VIEW COMPARISON:  02/28/2019 FINDINGS: Frontal and lateral views of the right forearm are obtained. There is a transverse fracture through the distal right radial diaphysis, at the site of the proximal screw within a distal radial ORIF. There is approximately 5 mm of ventral displacement of the distal fracture fragment. No other acute bony abnormalities. Alignment of the right elbow and wrist is anatomic. IMPRESSION: 1. Transverse fracture distal right radial diaphysis involving the proximal extent of orthopedic hardware from previous right radial ORIF. Mild ventral displacement of the distal fracture fragment. Electronically Signed   By: Randa Ngo M.D.   On: 12/19/2020 16:11    Procedures Procedures (including critical care time)  Medications Ordered in ED Medications  oxyCODONE-acetaminophen (PERCOCET/ROXICET) 5-325 MG per tablet 1 tablet (1 tablet Oral Given 12/19/20 2259)    ED Course  I have reviewed the triage vital signs and the nursing notes.  Pertinent labs & imaging results that were available during my care of the patient were reviewed by me and considered in my medical decision making (see chart for details).    MDM Rules/Calculators/A&P                          Alert 70 year old female, in no acute distress, uncomfortable appearing. Patient presents with chief complaint of right wrist pain after injury last night.  Per chart review in Feb 2020 patient had open reduction internal fixation of a displaced right distal radius fracture surgery. She reports that this surgery was performed by Dr. Joni Fears. X-ray right forearm shows transverse fracture distal right radial diaphysis involving the proximal extent of orthopedic hardware from previous right radial ORIF; mild ventral displacement of the distal fracture  fragment.    Patient is obvious deformity to right volar wrist, with swelling and bruising. Tender to palpation, decreased range of motion to right wrist. Right radial pulse +3, cap refill less than two and right digits, motor and sensation intact in right hand.    Will give patient for pain management. Will consult Ortho care physician.   23:18 spoke with Dr. Louanne Skye who recommended patient be placed in sugar-tong splint under traction.  Then have patient follow-up with office tomorrow to be evaluated and scheduled for surgery.  Discussed results, findings, treatment and follow up. Patient advised of return precautions. Patient verbalized understanding and agreed with plan.  Patient was discussed with and evaluated by Dr. Ralene Bathe.   Final Clinical Impression(s) / ED Diagnoses Final diagnoses:  Closed displaced transverse fracture of shaft of right radius, initial encounter    Rx / DC Orders ED Discharge Orders         Ordered    oxyCODONE-acetaminophen (PERCOCET/ROXICET) 5-325 MG tablet  Every 6 hours PRN        12/20/20 0001           Loni Beckwith, PA-C 12/20/20 0014    Quintella Reichert, MD 12/23/20 [phone]

## 2020-12-19 NOTE — Discharge Instructions (Signed)
You came to the emergency department today to be evaluated for your right wrist pain.  Your x-ray showed a transverse fracture of the distal right radial diaphysis involving the proximal extent of orthopedic hardware from previous right radial ORIF; with mild ventral displacement of the distal fracture fragment.  You were placed in a splint to immobilize your wrist.  Please call your orthopedist Dr. Sherlon Handing office first thing tomorrow morning to schedule an appointment for tomorrow.  Please keep your wrist elevated above your heart is much as possible to reduce swelling.  Please ice your wrist 20 minutes on 20 minutes off as much as possible to reduce swelling.  I have prescribed you with Percocet to help with your pain.  Please take this medication as prescribed.  This medication can cause sedation please do not drive or operate heavy machinery while taking it.  Please do not combine this medication with alcohol or other drugs.  Please return to the emergency department if:  You cannot move your fingers. You have severe pain. Your fingers or your hand: Become numb, cold, or pale. Turn a bluish color.

## 2020-12-19 NOTE — ED Triage Notes (Signed)
Pt states she was walking to bathroom last night and hit R forearm/wrist on a door knob.  Redness, swelling, and deformity.

## 2020-12-20 ENCOUNTER — Telehealth: Payer: Self-pay | Admitting: Orthopaedic Surgery

## 2020-12-20 MED ORDER — OXYCODONE-ACETAMINOPHEN 5-325 MG PO TABS
1.0000 | ORAL_TABLET | Freq: Four times a day (QID) | ORAL | 0 refills | Status: AC | PRN
Start: 2020-12-19 — End: 2020-12-22

## 2020-12-20 NOTE — Telephone Encounter (Signed)
Please see below.

## 2020-12-20 NOTE — Progress Notes (Signed)
Orthopedic Tech Progress Note Patient Details:  Jeanne Smith 01/04/1951 [id number]  Ortho Devices Type of Ortho Device: Arm sling,Sugartong splint Ortho Device/Splint Location: rue. applied while dr held traction. Ortho Device/Splint Interventions: Ordered,Application,Adjustment   Post Interventions Patient Tolerated: Well Instructions Provided: Care of device,Adjustment of device   Karolee Stamps 12/20/2020, 12:00 AM

## 2020-12-20 NOTE — Telephone Encounter (Signed)
I worked her in for Tuesday.

## 2020-12-20 NOTE — Telephone Encounter (Signed)
Collie Siad called on behalf of the pt stating the family is concerned about the number of falls she has had and she is wondering if it could be the medicine the pt is on. She states the pt fell again and broke her wrist for the second time; the pt does have an appt and sue doesn't believe she's being honest with how often she is falling. Collie Siad doesn't want Dr.Whitfield to mention that she called but all the pts family is really scared for her and just want to make sure everything is being addressed.

## 2020-12-20 NOTE — ED Notes (Signed)
E-signature pad unavailable at time of pt discharge. This RN discussed discharge materials with pt and answered all pt questions. Pt stated understanding of discharge material. ? ?

## 2020-12-20 NOTE — Telephone Encounter (Signed)
Pt called and said she has broken her wrist and she really wants Dr.Whitfeild to see her since he did a previous surgery for her. He doesn't have anything until 12/28/2020 but she defiantly needs to be seen before then. Can he work her in?

## 2020-12-21 ENCOUNTER — Ambulatory Visit (INDEPENDENT_AMBULATORY_CARE_PROVIDER_SITE_OTHER): Payer: Medicare Other | Admitting: Orthopaedic Surgery

## 2020-12-21 ENCOUNTER — Encounter (HOSPITAL_BASED_OUTPATIENT_CLINIC_OR_DEPARTMENT_OTHER): Payer: Self-pay | Admitting: Orthopaedic Surgery

## 2020-12-21 ENCOUNTER — Other Ambulatory Visit: Payer: Self-pay

## 2020-12-21 DIAGNOSIS — S52372A Galeazzi's fracture of left radius, initial encounter for closed fracture: Secondary | ICD-10-CM

## 2020-12-21 NOTE — Progress Notes (Signed)
Office Visit Note   Patient: Jeanne Smith           Date of Birth: Nov 04, 1951           MRN: [id number] Visit Date: 12/21/2020              Requested by: Elby Showers, MD [address] [place],  [place] [address] PCP: Elby Showers, MD   Assessment & Plan: Visit Diagnoses:  1. Closed Galeazzi's fracture of left radius, initial encounter     Plan: Impression is right radial shaft fracture proximal to the previous distal radius plate with questionable Galeazzi injury.  I have reviewed the x-rays with the patient and recommended surgical repair and evaluation of the DRUJ under fluoroscopy.  We placed her back in the sugar tong splint.  She will keep this elevated all times.  Risk benefits rehab recovery details of the surgery are reviewed with the patient in detail today.    Follow-Up Instructions: Return if symptoms worsen or fail to improve.   Orders:  No orders of the defined types were placed in this encounter.  No orders of the defined types were placed in this encounter.     Procedures: No procedures performed   Clinical Data: No additional findings.   Subjective: Chief Complaint  Patient presents with  . Right Wrist - Fracture    States that she hit it on  the door knob, but didn't think it was that bad, till the next morning and it was painful, and she spent most of Sunday in the ER. DOI:12/18/20    Jeanne Smith is a 70 year old female comes in for evaluation of recent right radial shaft fracture.  She states that she hit her right arm onto a doorknob really hard about 3 days ago.  She was evaluated in the ER and placed in a sugar tong splint.  X-rays demonstrated a fracture of the radial shaft just proximal to the end of the previous distal radius plate.  Denies any numbness and tingling or significant pain.   Review of Systems  Constitutional: Negative.   HENT: Negative.   Eyes: Negative.   Respiratory: Negative.   Cardiovascular: Negative.    Endocrine: Negative.   Musculoskeletal: Negative.   Neurological: Negative.   Hematological: Negative.   Psychiatric/Behavioral: Negative.   All other systems reviewed and are negative.    Objective: Vital Signs: There were no vitals taken for this visit.  Physical Exam Vitals and nursing note reviewed.  Constitutional:      Appearance: She is well-developed and well-nourished.  Pulmonary:     Effort: Pulmonary effort is normal.  Skin:    General: Skin is warm.     Capillary Refill: Capillary refill takes less than 2 seconds.  Neurological:     Mental Status: She is alert and oriented to person, place, and time.  Psychiatric:        Mood and Affect: Mood and affect normal.        Behavior: Behavior normal.        Thought Content: Thought content normal.        Judgment: Judgment normal.     Ortho Exam Right forearm and wrist show moderate swelling and ecchymosis.  No neurovascular compromise.  She is able to wiggle her fingers. Specialty Comments:  No specialty comments available.  Imaging: No results found.   PMFS History: Patient Active Problem List   Diagnosis Date Noted  . Closed Galeazzi's fracture of left radius 12/21/2020  .  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  . Unilateral primary osteoarthritis, right hip 06/15/2020  . Retained orthopedic hardware 06/15/2020  . Stenosing tenosynovitis of wrist 04/01/2019  . Closed fracture of right wrist 01/29/2019  . Pain in right wrist 01/21/2019  . Pain in right hand 01/21/2019  . 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 S/P hip replacement 05/16/2018  . Knee pain 03/28/2018  . Pure hypercholesterolemia 12/24/2017  . Intertrochanteric fracture of right femur ([place]) 06/15/2015  . Comminuted right humeral fracture 06/15/2015  . HTN (hypertension) 06/15/2015   Past Medical History:  Diagnosis Date  . Anxiety   . Arthritis    oa  . Attention deficit disorder   . Dysrhythmia 2019   episode of  palpitations x 25 January 2018, stress test normal  . H/O seasonal allergies   . Hyperlipidemia   . Hypertension   . Hypothyroidism    thyroid nodules, off synthroid > 15 years  . Insomnia   . Osteoarthritis   . Osteopenia   . Thyroid nodule     Family History  Problem Relation Age of Onset  . Heart failure Mother   . Hypertension Father   . Cancer Father   . Stroke Father   . Heart disease Brother   . Alcoholism Brother     Past Surgical History:  Procedure Laterality Date  . ABDOMINAL HYSTERECTOMY    . AUGMENTATION MAMMAPLASTY Bilateral    silicone over 30 years  . CONVERSION TO TOTAL HIP Left 05/16/2018   Procedure: LEFT HIP ANTERIOR APPROACH;  Surgeon: Paralee Cancel, MD;  Location: [facility];  Service: Orthopedics;  Laterality: Left;  . FEMUR IM NAIL Right 06/16/2015   Procedure: INTRAMEDULLARY (IM) NAIL FEMORAL;  Surgeon: Renette Butters, MD;  Location: [facility];  Service: Orthopedics;  Laterality: Right;  . HARDWARE REMOVAL Right 09/24/2020   Procedure: REMOVAL OF IM ROD/HIP SCREW RIGHT HIP;  Surgeon: Mcarthur Rossetti, MD;  Location: [facility];  Service: Orthopedics;  Laterality: Right;  . HIP SURGERY    . JOINT REPLACEMENT    . TOTAL HIP ARTHROPLASTY Right 09/24/2020   Procedure: RIGHT TOTAL HIP ARTHROPLASTY ANTERIOR APPROACH;  Surgeon: Mcarthur Rossetti, MD;  Location: [facility];  Service: Orthopedics;  Laterality: Right;  . TUBAL LIGATION     Social History   Occupational History  . Not on file  Tobacco Use  . Smoking status: Former Smoker    Packs/day: 0.25    Types: Cigarettes    Quit date: 11/27/2005    Years since quitting: 15.0  . Smokeless tobacco: Never Used  Vaping Use  . Vaping Use: Never used  Substance and Sexual Activity  . Alcohol use: Yes    Alcohol/week: 14.0 standard drinks    Types: 14 Glasses of wine per week    Comment: scotch or wine daily  . Drug use: No  . Sexual activity: Not on file

## 2020-12-21 NOTE — Telephone Encounter (Signed)
thanks

## 2020-12-22 ENCOUNTER — Other Ambulatory Visit (HOSPITAL_COMMUNITY)
Admission: RE | Admit: 2020-12-22 | Discharge: 2020-12-22 | Disposition: A | Discharge status: Home / Self Care | Payer: Medicare Other | Source: Ambulatory Visit | Attending: Orthopaedic Surgery | Admitting: Orthopaedic Surgery

## 2020-12-22 ENCOUNTER — Encounter (HOSPITAL_BASED_OUTPATIENT_CLINIC_OR_DEPARTMENT_OTHER)
Admission: RE | Admit: 2020-12-22 | Discharge: 2020-12-22 | Disposition: A | Discharge status: Home / Self Care | Payer: Medicare Other | Source: Ambulatory Visit | Attending: Orthopaedic Surgery | Admitting: Orthopaedic Surgery

## 2020-12-22 DIAGNOSIS — Z79890 Hormone replacement therapy (postmenopausal): Secondary | ICD-10-CM | POA: Diagnosis not present

## 2020-12-22 DIAGNOSIS — X58XXXA Exposure to other specified factors, initial encounter: Secondary | ICD-10-CM | POA: Diagnosis not present

## 2020-12-22 DIAGNOSIS — Z20822 Contact with and (suspected) exposure to covid-19: Secondary | ICD-10-CM | POA: Insufficient documentation

## 2020-12-22 DIAGNOSIS — Z7982 Long term (current) use of aspirin: Secondary | ICD-10-CM | POA: Diagnosis not present

## 2020-12-22 DIAGNOSIS — Z01812 Encounter for preprocedural laboratory examination: Secondary | ICD-10-CM | POA: Insufficient documentation

## 2020-12-22 DIAGNOSIS — Z79899 Other long term (current) drug therapy: Secondary | ICD-10-CM | POA: Diagnosis not present

## 2020-12-22 DIAGNOSIS — S52391A Other fracture of shaft of radius, right arm, initial encounter for closed fracture: Secondary | ICD-10-CM | POA: Diagnosis not present

## 2020-12-22 DIAGNOSIS — Z87891 Personal history of nicotine dependence: Secondary | ICD-10-CM | POA: Diagnosis not present

## 2020-12-22 DIAGNOSIS — Z791 Long term (current) use of non-steroidal anti-inflammatories (NSAID): Secondary | ICD-10-CM | POA: Diagnosis not present

## 2020-12-22 LAB — COMPREHENSIVE METABOLIC PANEL
ALT: 178 U/L — ABNORMAL HIGH (ref 0–44)
AST: 508 U/L — ABNORMAL HIGH (ref 15–41)
Albumin: 3.9 g/dL (ref 3.5–5.0)
Alkaline Phosphatase: 197 U/L — ABNORMAL HIGH (ref 38–126)
Anion gap: 16 — ABNORMAL HIGH (ref 5–15)
BUN: 30 mg/dL — ABNORMAL HIGH (ref 8–23)
CO2: 26 mmol/L (ref 22–32)
Calcium: 10.5 mg/dL — ABNORMAL HIGH (ref 8.9–10.3)
Chloride: 91 mmol/L — ABNORMAL LOW (ref 98–111)
Creatinine, Ser: 1.06 mg/dL — ABNORMAL HIGH (ref 0.44–1.00)
GFR, Estimated: 57 mL/min — ABNORMAL LOW (ref >60)
Glucose, Bld: 135 mg/dL — ABNORMAL HIGH (ref 70–99)
Potassium: 3.7 mmol/L (ref 3.5–5.1)
Sodium: 133 mmol/L — ABNORMAL LOW (ref 135–145)
Total Bilirubin: 3.4 mg/dL — ABNORMAL HIGH (ref 0.3–1.2)
Total Protein: 7.3 g/dL (ref 6.5–8.1)

## 2020-12-22 MED ORDER — ZOLPIDEM TARTRATE 10 MG PO TABS: ORAL_TABLET | ORAL | 0 refills | Status: DC

## 2020-12-22 NOTE — Addendum Note (Signed)
Addended by: Mady Haagensen on: 12/22/2020 12:56 PM   Modules accepted: Orders

## 2020-12-22 NOTE — Telephone Encounter (Signed)
CPE scheduled  

## 2020-12-22 NOTE — Telephone Encounter (Signed)
Patient has scheduled CPE needs Ambien refill.

## 2020-12-22 NOTE — Progress Notes (Signed)

## 2020-12-23 LAB — SARS CORONAVIRUS 2 (TAT 6-24 HRS): SARS Coronavirus 2: NEGATIVE

## 2020-12-23 NOTE — Progress Notes (Signed)
Lab results reviewed by Dr. Miller, will proceed with surgery as scheduled. 

## 2020-12-24 ENCOUNTER — Other Ambulatory Visit: Payer: Self-pay

## 2020-12-24 ENCOUNTER — Ambulatory Visit (HOSPITAL_BASED_OUTPATIENT_CLINIC_OR_DEPARTMENT_OTHER)
Admission: RE | Admit: 2020-12-24 | Discharge: 2020-12-24 | Disposition: A | Discharge status: Home / Self Care | Payer: Medicare Other | Attending: Orthopaedic Surgery | Admitting: Orthopaedic Surgery

## 2020-12-24 ENCOUNTER — Encounter (HOSPITAL_BASED_OUTPATIENT_CLINIC_OR_DEPARTMENT_OTHER)
Admission: RE | Disposition: A | Discharge status: Home / Self Care | Payer: Self-pay | Source: Home / Self Care | Attending: Orthopaedic Surgery

## 2020-12-24 ENCOUNTER — Encounter (HOSPITAL_BASED_OUTPATIENT_CLINIC_OR_DEPARTMENT_OTHER): Payer: Self-pay | Admitting: Orthopaedic Surgery

## 2020-12-24 ENCOUNTER — Ambulatory Visit (HOSPITAL_BASED_OUTPATIENT_CLINIC_OR_DEPARTMENT_OTHER): Payer: Medicare Other | Admitting: Anesthesiology

## 2020-12-24 DIAGNOSIS — Z87891 Personal history of nicotine dependence: Secondary | ICD-10-CM | POA: Insufficient documentation

## 2020-12-24 DIAGNOSIS — Z7982 Long term (current) use of aspirin: Secondary | ICD-10-CM | POA: Diagnosis not present

## 2020-12-24 DIAGNOSIS — S52391A Other fracture of shaft of radius, right arm, initial encounter for closed fracture: Secondary | ICD-10-CM | POA: Diagnosis not present

## 2020-12-24 DIAGNOSIS — Z79890 Hormone replacement therapy (postmenopausal): Secondary | ICD-10-CM | POA: Insufficient documentation

## 2020-12-24 DIAGNOSIS — E039 Hypothyroidism, unspecified: Secondary | ICD-10-CM | POA: Diagnosis not present

## 2020-12-24 DIAGNOSIS — S52372A Galeazzi's fracture of left radius, initial encounter for closed fracture: Secondary | ICD-10-CM | POA: Diagnosis not present

## 2020-12-24 DIAGNOSIS — M1611 Unilateral primary osteoarthritis, right hip: Secondary | ICD-10-CM | POA: Diagnosis not present

## 2020-12-24 DIAGNOSIS — Z79899 Other long term (current) drug therapy: Secondary | ICD-10-CM | POA: Diagnosis not present

## 2020-12-24 DIAGNOSIS — X58XXXA Exposure to other specified factors, initial encounter: Secondary | ICD-10-CM | POA: Insufficient documentation

## 2020-12-24 DIAGNOSIS — I10 Essential (primary) hypertension: Secondary | ICD-10-CM | POA: Diagnosis not present

## 2020-12-24 DIAGNOSIS — Z791 Long term (current) use of non-steroidal anti-inflammatories (NSAID): Secondary | ICD-10-CM | POA: Insufficient documentation

## 2020-12-24 DIAGNOSIS — S52371A Galeazzi's fracture of right radius, initial encounter for closed fracture: Secondary | ICD-10-CM | POA: Diagnosis not present

## 2020-12-24 HISTORY — PX: HARDWARE REMOVAL: SHX979

## 2020-12-24 HISTORY — PX: ORIF RADIAL FRACTURE: SHX5113

## 2020-12-24 SURGERY — REMOVAL, HARDWARE
Anesthesia: Regional | Site: Wrist | Laterality: Right

## 2020-12-24 MED ORDER — BUPIVACAINE-EPINEPHRINE (PF) 0.5% -1:200000 IJ SOLN
INTRAMUSCULAR | Status: DC | PRN
  Administered 2020-12-24: 40 mL

## 2020-12-24 MED ORDER — PHENYLEPHRINE HCL (PRESSORS) 10 MG/ML IV SOLN
INTRAVENOUS | Status: DC | PRN
  Administered 2020-12-24 (×4): 80 ug via INTRAVENOUS

## 2020-12-24 MED ORDER — MIDAZOLAM HCL 2 MG/2ML IJ SOLN
INTRAMUSCULAR | Status: AC
  Filled 2020-12-24: qty 2

## 2020-12-24 MED ORDER — DEXAMETHASONE SODIUM PHOSPHATE 4 MG/ML IJ SOLN
INTRAMUSCULAR | Status: DC | PRN
  Administered 2020-12-24: 6 mg via PERINEURAL

## 2020-12-24 MED ORDER — FENTANYL CITRATE (PF) 100 MCG/2ML IJ SOLN: 25.0000 ug | INTRAMUSCULAR | Status: DC | PRN

## 2020-12-24 MED ORDER — LACTATED RINGERS IV SOLN: INTRAVENOUS | Status: DC

## 2020-12-24 MED ORDER — ONDANSETRON HCL 4 MG/2ML IJ SOLN
INTRAMUSCULAR | Status: DC | PRN
  Administered 2020-12-24: 4 mg via INTRAVENOUS

## 2020-12-24 MED ORDER — OXYCODONE HCL 5 MG PO TABS: 5.0000 mg | ORAL_TABLET | Freq: Once | ORAL | Status: DC | PRN

## 2020-12-24 MED ORDER — OXYCODONE-ACETAMINOPHEN 5-325 MG PO TABS: 1.0000 | ORAL_TABLET | Freq: Three times a day (TID) | ORAL | 0 refills | Status: DC | PRN

## 2020-12-24 MED ORDER — FENTANYL CITRATE (PF) 100 MCG/2ML IJ SOLN
100.0000 ug | Freq: Once | INTRAMUSCULAR | Status: AC
  Administered 2020-12-24: 50 ug via INTRAVENOUS

## 2020-12-24 MED ORDER — CLONIDINE HCL (ANALGESIA) 100 MCG/ML EP SOLN
EPIDURAL | Status: DC | PRN
  Administered 2020-12-24: 100 ug

## 2020-12-24 MED ORDER — OXYCODONE HCL 5 MG/5ML PO SOLN: 5.0000 mg | Freq: Once | ORAL | Status: DC | PRN

## 2020-12-24 MED ORDER — CEFAZOLIN SODIUM-DEXTROSE 2-4 GM/100ML-% IV SOLN
2.0000 g | INTRAVENOUS | Status: AC
  Administered 2020-12-24: 2 g via INTRAVENOUS

## 2020-12-24 MED ORDER — MIDAZOLAM HCL 2 MG/2ML IJ SOLN
2.0000 mg | Freq: Once | INTRAMUSCULAR | Status: AC
  Administered 2020-12-24: 1 mg via INTRAVENOUS

## 2020-12-24 MED ORDER — LIDOCAINE 2% (20 MG/ML) 5 ML SYRINGE
INTRAMUSCULAR | Status: DC | PRN
  Administered 2020-12-24: 30 mg via INTRAVENOUS

## 2020-12-24 MED ORDER — PROPOFOL 500 MG/50ML IV EMUL
INTRAVENOUS | Status: DC | PRN
  Administered 2020-12-24: 100 ug/kg/min via INTRAVENOUS

## 2020-12-24 MED ORDER — FENTANYL CITRATE (PF) 100 MCG/2ML IJ SOLN
INTRAMUSCULAR | Status: AC
  Filled 2020-12-24: qty 2

## 2020-12-24 MED ORDER — VITAMIN C 500 MG PO CHEW: 500.0000 mg | CHEWABLE_TABLET | Freq: Two times a day (BID) | ORAL | 0 refills | Status: DC

## 2020-12-24 MED ORDER — 0.9 % SODIUM CHLORIDE (POUR BTL) OPTIME
TOPICAL | Status: DC | PRN
  Administered 2020-12-24: 2000 mL

## 2020-12-24 SURGICAL SUPPLY — 79 items
BIT DRILL 2 FAST STEP (BIT) ×1 IMPLANT
BIT DRILL 2.5X4 QC (BIT) ×1 IMPLANT
BLADE MINI RND TIP GREEN BEAV (BLADE) IMPLANT
BLADE SURG 15 STRL LF DISP TIS (BLADE) ×2 IMPLANT
BLADE SURG 15 STRL SS (BLADE) ×8
BNDG CMPR 9X4 STRL LF SNTH (GAUZE/BANDAGES/DRESSINGS) ×1
BNDG ELASTIC 3X5.8 VLCR STR LF (GAUZE/BANDAGES/DRESSINGS) ×3 IMPLANT
BNDG ELASTIC 4X5.8 VLCR STR LF (GAUZE/BANDAGES/DRESSINGS) IMPLANT
BNDG ESMARK 4X9 LF (GAUZE/BANDAGES/DRESSINGS) ×2 IMPLANT
BRUSH SCRUB EZ PLAIN DRY (MISCELLANEOUS) ×2 IMPLANT
CORD BIPOLAR FORCEPS 12FT (ELECTRODE) ×2 IMPLANT
COVER BACK TABLE 60X90IN (DRAPES) ×2 IMPLANT
COVER MAYO STAND STRL (DRAPES) ×2 IMPLANT
COVER WAND RF STERILE (DRAPES) IMPLANT
CUFF TOURN SGL QUICK 18X4 (TOURNIQUET CUFF) ×1 IMPLANT
DECANTER SPIKE VIAL GLASS SM (MISCELLANEOUS) IMPLANT
DRAPE EXTREMITY T 121X128X90 (DISPOSABLE) ×2 IMPLANT
DRAPE IMP U-DRAPE 54X76 (DRAPES) ×2 IMPLANT
DRAPE OEC MINIVIEW 54X84 (DRAPES) ×2 IMPLANT
DRAPE SURG 17X23 STRL (DRAPES) ×2 IMPLANT
DRIVER PEG 2.0 FAST (BIT) ×2 IMPLANT
GAUZE 4X4 16PLY RFD (DISPOSABLE) IMPLANT
GAUZE SPONGE 4X4 12PLY STRL (GAUZE/BANDAGES/DRESSINGS) ×2 IMPLANT
GAUZE XEROFORM 1X8 LF (GAUZE/BANDAGES/DRESSINGS) ×2 IMPLANT
GLOVE ECLIPSE 7.5 STRL STRAW (GLOVE) ×3 IMPLANT
GLOVE SURG LTX SZ7 (GLOVE) ×2 IMPLANT
GLOVE SURG NEOP MICRO LF SZ7.5 (GLOVE) ×2 IMPLANT
GLOVE SURG SYN 7.5  E (GLOVE) ×6
GLOVE SURG SYN 7.5 E (GLOVE) ×3 IMPLANT
GLOVE SURG SYN 7.5 PF PI (GLOVE) ×1 IMPLANT
GLOVE SURG UNDER POLY LF SZ7 (GLOVE) ×2 IMPLANT
GOWN STRL REIN XL XLG (GOWN DISPOSABLE) ×2 IMPLANT
GOWN STRL REUS W/ TWL LRG LVL3 (GOWN DISPOSABLE) ×1 IMPLANT
GOWN STRL REUS W/ TWL XL LVL3 (GOWN DISPOSABLE) ×1 IMPLANT
GOWN STRL REUS W/TWL 2XL LVL3 (GOWN DISPOSABLE) ×1 IMPLANT
GOWN STRL REUS W/TWL LRG LVL3 (GOWN DISPOSABLE)
GOWN STRL REUS W/TWL XL LVL3 (GOWN DISPOSABLE) ×2
K-WIRE 1.6 (WIRE) ×2
K-WIRE FX5X1.6XNS BN SS (WIRE) ×1
KWIRE FX5X1.6XNS BN SS (WIRE) IMPLANT
NEEDLE HYPO 22GX1.5 SAFETY (NEEDLE) IMPLANT
NS IRRIG 1000ML POUR BTL (IV SOLUTION) ×2 IMPLANT
PACK BASIN DAY SURGERY FS (CUSTOM PROCEDURE TRAY) ×2 IMPLANT
PAD CAST 3X4 CTTN HI CHSV (CAST SUPPLIES) ×1 IMPLANT
PADDING CAST COTTON 3X4 STRL (CAST SUPPLIES) ×2
PADDING CAST SYNTHETIC 3 NS LF (CAST SUPPLIES) ×1
PADDING CAST SYNTHETIC 3X4 NS (CAST SUPPLIES) IMPLANT
PADDING CAST SYNTHETIC 4 (CAST SUPPLIES)
PADDING CAST SYNTHETIC 4X4 STR (CAST SUPPLIES) IMPLANT
PLATE XLONG 24.4X89.5 RT (Plate) ×1 IMPLANT
SCREW BN 12X3.5XNS CORT TI (Screw) IMPLANT
SCREW BN 13X3.5XNS CORT TI (Screw) IMPLANT
SCREW CORT 3.5X10 LNG (Screw) ×3 IMPLANT
SCREW CORT 3.5X12 (Screw) ×8 IMPLANT
SCREW CORT 3.5X13 (Screw) ×2 IMPLANT
SCREW PEG LOCK 2.5X12 (Screw) ×1 IMPLANT
SCREW PEG LOCK 2.5X16 (Peg) ×2 IMPLANT
SCREW PEG LOCK 2.5X20 (Peg) ×1 IMPLANT
SHEET MEDIUM DRAPE 40X70 STRL (DRAPES) ×2 IMPLANT
SLEEVE SCD COMPRESS KNEE MED (MISCELLANEOUS) ×2 IMPLANT
SLING ARM FOAM STRAP LRG (SOFTGOODS) ×1 IMPLANT
SPLINT FIBERGLASS 3X35 (CAST SUPPLIES) ×1 IMPLANT
SPONGE LAP 18X18 RF (DISPOSABLE) ×2 IMPLANT
STOCKINETTE 4X48 STRL (DRAPES) ×2 IMPLANT
SUCTION FRAZIER HANDLE 10FR (MISCELLANEOUS) ×2
SUCTION TUBE FRAZIER 10FR DISP (MISCELLANEOUS) ×1 IMPLANT
SUT ETHILON 3 0 PS 1 (SUTURE) ×1 IMPLANT
SUT ETHILON 4 0 PS 2 18 (SUTURE) ×3 IMPLANT
SUT VIC AB 2-0 SH 27 (SUTURE)
SUT VIC AB 2-0 SH 27XBRD (SUTURE) IMPLANT
SUT VIC AB 3-0 PS1 18 (SUTURE) ×4
SUT VIC AB 3-0 PS1 18XBRD (SUTURE) ×1 IMPLANT
SYR BULB EAR ULCER 3OZ GRN STR (SYRINGE) ×1 IMPLANT
SYR CONTROL 10ML LL (SYRINGE) IMPLANT
TOWEL GREEN STERILE FF (TOWEL DISPOSABLE) ×4 IMPLANT
TRAY DSU PREP LF (CUSTOM PROCEDURE TRAY) ×2 IMPLANT
TUBE CONNECTING 20X1/4 (TUBING) ×2 IMPLANT
UNDERPAD 30X36 HEAVY ABSORB (UNDERPADS AND DIAPERS) ×2 IMPLANT
YANKAUER SUCT BULB TIP NO VENT (SUCTIONS) IMPLANT

## 2020-12-24 NOTE — Anesthesia Preprocedure Evaluation (Signed)
Anesthesia Evaluation  Patient identified by MRN, date of birth, ID band Patient awake    Reviewed: Allergy & Precautions, NPO status , Patient's Chart, lab work & pertinent test results  History of Anesthesia Complications Negative for: history of anesthetic complications  Airway Mallampati: II  TM Distance: >3 FB Neck ROM: Full    Dental  (+) Dental Advisory Given   Pulmonary former smoker,    Pulmonary exam normal        Cardiovascular hypertension, Pt. on medications Normal cardiovascular exam+ dysrhythmias    '19 Myoperfusion - The left ventricular ejection fraction is hyperdynamic (>65%). Nuclear stress EF: 67%. No wall motion abnormality. There was no ST segment deviation noted during stress. This is a low risk study. No ischemia identified. The study is normal.     Neuro/Psych PSYCHIATRIC DISORDERS Anxiety negative neurological ROS     GI/Hepatic negative GI ROS, Neg liver ROS,   Endo/Other  Hypothyroidism (formerly on synthroid, no mesd x 10 yrs)   Renal/GU negative Renal ROS     Musculoskeletal  (+) Arthritis ,   Abdominal   Peds  (+) ATTENTION DEFICIT DISORDER WITHOUT HYPERACTIVITY Hematology  Thrombocytopenia, Plt 139k    Anesthesia Other Findings Covid test negative   Reproductive/Obstetrics                             Anesthesia Physical  Anesthesia Plan  ASA: II  Anesthesia Plan: Regional   Post-op Pain Management:    Induction: Intravenous  PONV Risk Score and Plan: 2 and Treatment may vary due to age or medical condition and Propofol infusion  Airway Management Planned: Natural Airway and Simple Face Mask  Additional Equipment: None  Intra-op Plan:   Post-operative Plan:   Informed Consent: I have reviewed the patients History and Physical, chart, labs and discussed the procedure including the risks, benefits and alternatives for the proposed  anesthesia with the patient or authorized representative who has indicated his/her understanding and acceptance.     Dental advisory given  Plan Discussed with: CRNA  Anesthesia Plan Comments:         Anesthesia Quick Evaluation

## 2020-12-24 NOTE — Transfer of Care (Signed)
Immediate Anesthesia Transfer of Care Note  Patient: [name]  Procedure(s) Performed: HARDWARE REMOVAL RIGHT WRIST (Right Wrist) OPEN REDUCTION INTERNAL FIXATION (ORIF) RIGHT RADIAL SHAFT FRACTURE (Right Wrist)  Patient Location: PACU  Anesthesia Type:GA combined with regional for post-op pain  Level of Consciousness: awake, alert  and oriented  Airway & Oxygen Therapy: Patient Spontanous Breathing and Patient connected to face mask oxygen  Post-op Assessment: Report given to RN and Post -op Vital signs reviewed and stable  Post vital signs: Reviewed and stable  Last Vitals:  Vitals Value Taken Time  BP 128/71 12/24/20 1534  Temp    Pulse 93 12/24/20 1535  Resp 21 12/24/20 1535  SpO2 93 % 12/24/20 1535  Vitals shown include unvalidated device data.  Last Pain:  Vitals:   12/24/20 1206  TempSrc: Oral  PainSc: 2       Patients Stated Pain Goal: 2 (97/35/32 9924)  Complications: No complications documented.

## 2020-12-24 NOTE — Anesthesia Procedure Notes (Signed)
Procedure Name: MAC Date/Time: 12/24/2020 2:05 PM Performed by: Signe Colt, CRNA Pre-anesthesia Checklist: Patient identified, Emergency Drugs available, Suction available, Patient being monitored and Timeout performed Patient Re-evaluated:Patient Re-evaluated prior to induction

## 2020-12-24 NOTE — Op Note (Signed)
Date of Surgery: 12/24/2020  INDICATIONS: Jeanne Smith is a 70 y.o.-year-old female who suffered a radial shaft fracture proximal to previous distal radius plate with questionable Galeazzi injury indicated for ORIF.  The patient did consent to the procedure after discussion of the risks and benefits.  PREOPERATIVE DIAGNOSIS: Right radial shaft fracture questionable Galeazzi injury with retained hardware  POSTOPERATIVE DIAGNOSIS: Same  PROCEDURE:  1.  Open reduction internal fixation of right radial shaft fracture 2.  Removal of previous distal radius plate and associated screws 3.  Examination of right wrist under anesthesia and fluoroscopy  SURGEON: N. Eduard Roux, M.D.  ASSIST: Ciro Backer Hom[place], [place]; necessary for the timely completion of procedure and due to complexity of procedure.  ANESTHESIA: Axillary, MAC  IV FLUIDS AND URINE: See anesthesia.  ESTIMATED BLOOD LOSS: Minimal mL.  IMPLANTS: [employer] hand innovations long distal radius plate  DRAINS: None  COMPLICATIONS: see description of procedure.  DESCRIPTION OF PROCEDURE: The patient was brought to the operating room.  The patient had been signed prior to the procedure and this was documented. The patient had the anesthesia placed by the anesthesiologist.  A time-out was performed to confirm that this was the correct patient, site, side and location. The patient did receive antibiotics prior to the incision and was re-dosed during the procedure as needed at indicated intervals.  A tourniquet was placed.  The patient had the operative extremity prepped and draped in the standard surgical fashion.    The previous surgical scar was incised and extended proximally over the forearm.  The interval between the FCR and radial artery was developed.  The FCR was swept ulnarly and the radial artery was carefully mobilized and swept radially.  Retractors were placed.  We then found the proximal end of the plate poking through the muscle  belly of the FPL.  The muscle was then bluntly elevated off of the hardware and proximally off of the radial shaft.  The previous pronator quadratus was elevated off of the plate as well.  Once the entire plate was exposed the screws were removed sequentially by hand without difficulty.  The plate was then elevated off of the distal radius using an osteotome.  There was a butterfly fragment associated with the fracture on the radial side that had good soft tissue attachment.  Subperiosteal elevation was performed over the proximal radial shaft.  The screw holes were gently debrided.  We then obtained a reduction of the fracture.  We then went back in with a longer distal radius plate spanning the entire fracture.  The position of the plate in relation to the fracture was confirmed under fluoroscopy.  4 nonlocking bicortical screws were placed through the proximal portion of the plate into the radial shaft each with excellent fixation.  I then placed a nonlocking screw distal to the fracture through the shaft of the plate in a bicortical fashion with excellent fixation.  I then filled the proximal row of the plate with locking unicortical screws.  After this was done the DRUJ was evaluated under fluoroscopy and was intact.  The surgical wound was then thoroughly irrigated and closed in layered fashion with 3-0 Vicryl and 4-0 nylon.  Sterile dressings were applied.  A volar wrist splint was applied.  Patient tolerated procedure well and was transferred to the PACU in stable condition.  Tawanna Cooler was necessary for opening, closing, retracting, limb positioning and overall facilitation and timely completion of the procedure.  POSTOPERATIVE PLAN: Patient will follow  up in 2 weeks for suture removal and repeat two-view x-rays of the right wrist.  Azucena Cecil, MD 3:20 PM

## 2020-12-24 NOTE — Anesthesia Procedure Notes (Signed)
Anesthesia Regional Block: Axillary brachial plexus block   Pre-Anesthetic Checklist: ,, timeout performed, Correct Patient, Correct Site, Correct Laterality, Correct Procedure, Correct Position, site marked, Risks and benefits discussed,  Surgical consent,  Pre-op evaluation,  At surgeon's request and post-op pain management  Laterality: Right  Prep: chloraprep       Needles:  Injection technique: Single-shot  Needle Type: Stimiplex          Additional Needles:   Procedures:,,,, ultrasound used (permanent image in chart),,,,  Narrative:  Start time: 12/24/2020 12:48 PM End time: 12/24/2020 12:54 PM Injection made incrementally with aspirations every 5 mL.  Performed by: Personally   Additional Notes: Risks, benefits and alternative to block explained extensively. Local given under direct [place] vision with negative aspiration every 41ml. Good perineural spread. Patient tolerated procedure well, without complications.

## 2020-12-24 NOTE — H&P (Signed)

## 2020-12-24 NOTE — Discharge Instructions (Signed)
° °  Postoperative instructions:  Weightbearing instructions: non weight bearing  Dressing instructions: Keep your dressing and/or splint clean and dry at all times.  It will be removed at your first post-operative appointment.  Your stitches and/or staples will be removed at this visit.  Incision instructions:  Do not soak your incision for 3 weeks after surgery.  If the incision gets wet, pat dry and do not scrub the incision.  Pain control:  You have been given a prescription to be taken as directed for post-operative pain control.  In addition, elevate the operative extremity above the heart at all times to prevent swelling and throbbing pain.  Take over-the-counter Colace, 100mg by mouth twice a day while taking narcotic pain medications to help prevent constipation.  Follow up appointments: 1) 14 days for suture removal and wound check. 2) Jeanne Smith as scheduled.   -------------------------------------------------------------------------------------------------------------  After Surgery Pain Control:  After your surgery, post-surgical discomfort or pain is likely. This discomfort can last several days to a few weeks. At certain times of the day your discomfort may be more intense.  Did you receive a nerve block?  A nerve block can provide pain relief for one hour to two days after your surgery. As long as the nerve block is working, you will experience little or no sensation in the area the surgeon operated on.  As the nerve block wears off, you will begin to experience pain or discomfort. It is very important that you begin taking your prescribed pain medication before the nerve block fully wears off. Treating your pain at the first sign of the block wearing off will ensure your pain is better controlled and more tolerable when full-sensation returns. Do not wait until the pain is intolerable, as the medicine will be less effective. It is better to treat pain in advance than to try and  catch up.  General Anesthesia:  If you did not receive a nerve block during your surgery, you will need to start taking your pain medication shortly after your surgery and should continue to do so as prescribed by your surgeon.  Pain Medication:  Most commonly we prescribe Vicodin and Percocet for post-operative pain. Both of these medications contain a combination of acetaminophen (Tylenol) and a narcotic to help control pain.   It takes between 30 and 45 minutes before pain medication starts to work. It is important to take your medication before your pain level gets too intense.   Nausea is a common side effect of many pain medications. You will want to eat something before taking your pain medicine to help prevent nausea.   If you are taking a prescription pain medication that contains acetaminophen, we recommend that you do not take additional over the counter acetaminophen (Tylenol).  Other pain relieving options:   Using a cold pack to ice the affected area a few times a day (15 to 20 minutes at a time) can help to relieve pain, reduce swelling and bruising.   Elevation of the affected area can also help to reduce pain and swelling.  

## 2020-12-24 NOTE — Progress Notes (Signed)
Assisted Dr. Germeroth with right, ultrasound guided, axillary block. Side rails up, monitors on throughout procedure. See vital signs in flow sheet. Tolerated Procedure well. 

## 2020-12-27 ENCOUNTER — Encounter (HOSPITAL_BASED_OUTPATIENT_CLINIC_OR_DEPARTMENT_OTHER): Payer: Self-pay | Admitting: Orthopaedic Surgery

## 2020-12-27 NOTE — Anesthesia Postprocedure Evaluation (Signed)
Anesthesia Post Note  Patient: Jeanne Smith  Procedure(s) Performed: HARDWARE REMOVAL RIGHT WRIST (Right Wrist) OPEN REDUCTION INTERNAL FIXATION (ORIF) RIGHT RADIAL SHAFT FRACTURE (Right Wrist)     Patient location during evaluation: PACU Anesthesia Type: Regional and MAC Level of consciousness: awake and alert Pain management: pain level controlled Vital Signs Assessment: post-procedure vital signs reviewed and stable Respiratory status: spontaneous breathing, nonlabored ventilation, respiratory function stable and patient connected to nasal cannula oxygen Cardiovascular status: stable and blood pressure returned to baseline Postop Assessment: no apparent nausea or vomiting Anesthetic complications: no   No complications documented.  Last Vitals:  Vitals:   12/24/20 1545 12/24/20 1601  BP: (!) 147/82 123/75  Pulse: 89 90  Resp: 17 18  Temp:  (!) 36.4 C  SpO2: 98% 93%    Last Pain:  Vitals:   12/24/20 1601  TempSrc:   PainSc: 0-No pain   Pain Goal: Patients Stated Pain Goal: 2 (12/24/20 1206)                 [place]

## 2020-12-28 ENCOUNTER — Ambulatory Visit: Payer: Medicare Other | Admitting: Orthopaedic Surgery

## 2020-12-31 ENCOUNTER — Telehealth: Payer: Self-pay | Admitting: *Deleted

## 2020-12-31 NOTE — Telephone Encounter (Signed)
Ortho bundle 90 day call.

## 2021-01-06 ENCOUNTER — Ambulatory Visit (INDEPENDENT_AMBULATORY_CARE_PROVIDER_SITE_OTHER): Payer: Medicare Other | Admitting: Orthopaedic Surgery

## 2021-01-06 ENCOUNTER — Encounter: Payer: Self-pay | Admitting: Orthopaedic Surgery

## 2021-01-06 ENCOUNTER — Ambulatory Visit (INDEPENDENT_AMBULATORY_CARE_PROVIDER_SITE_OTHER): Payer: Medicare Other

## 2021-01-06 VITALS — Ht 64.0 in | Wt 134.0 lb

## 2021-01-06 DIAGNOSIS — S52371A Galeazzi's fracture of right radius, initial encounter for closed fracture: Secondary | ICD-10-CM

## 2021-01-06 DIAGNOSIS — S52372A Galeazzi's fracture of left radius, initial encounter for closed fracture: Secondary | ICD-10-CM

## 2021-01-06 NOTE — Progress Notes (Signed)
Post-Op Visit Note   Patient: Jeanne Smith           Date of Birth: 12/05/1950           MRN: [id number] Visit Date: 01/06/2021 PCP: Elby Showers, MD   Assessment & Plan:  Chief Complaint:  Chief Complaint  Patient presents with  . Right Arm - Follow-up    Right hardware removal and ORIF radial shaft fracture 12/24/2020   Visit Diagnoses:  1. Closed Galeazzi's fracture of left radius, initial encounter     Plan:   Ms. Divirgilio is 2 weeks status post ORIF right radial shaft perimplant fracture.  She is doing well overall.  No real complaints.  Surgical incision is healed.  No signs of infection.  She has mild ecchymosis and swelling.  Neurovascular intact.  X-rays demonstrate stable fixation and alignment of the fracture without complication.  Sutures removed today.  May begin gentle range of motion to the wrist as demonstrated.  Wrist brace provided today.  We will see her back in 4 weeks with repeat two-view x-rays of the right wrist.  Follow-Up Instructions: Return in about 4 weeks (around 02/03/2021).   Orders:  Orders Placed This Encounter  Procedures  . XR Forearm Right   No orders of the defined types were placed in this encounter.   Imaging: XR Forearm Right  Result Date: 01/06/2021 Stable alignment and fixation of radial shaft fracture.  No hardware complications.   PMFS History: Patient Active Problem List   Diagnosis Date Noted  . Closed Galeazzi's fracture of left radius 12/21/2020  . 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  . Retained orthopedic hardware 06/15/2020  . Stenosing tenosynovitis of wrist 04/01/2019  . Closed fracture of right wrist 01/29/2019  . Pain in right wrist 01/21/2019  . Pain in right hand 01/21/2019  . 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 S/P hip replacement 05/16/2018  . Knee pain 03/28/2018  . Pure hypercholesterolemia 12/24/2017  . Intertrochanteric fracture of right femur ([place]) 06/15/2015  . Comminuted right  humeral fracture 06/15/2015  . HTN (hypertension) 06/15/2015   Past Medical History:  Diagnosis Date  . Anxiety   . Arthritis    oa  . Attention deficit disorder   . Dysrhythmia 2019   episode of palpitations x 25 January 2018, stress test normal  . H/O seasonal allergies   . Hyperlipidemia   . Hypertension   . Hypothyroidism    thyroid nodules, off synthroid > 15 years  . Insomnia   . Osteoarthritis   . Osteopenia   . Thyroid nodule     Family History  Problem Relation Age of Onset  . Heart failure Mother   . Hypertension Father   . Cancer Father   . Stroke Father   . Heart disease Brother   . Alcoholism Brother     Past Surgical History:  Procedure Laterality Date  . ABDOMINAL HYSTERECTOMY    . AUGMENTATION MAMMAPLASTY Bilateral    silicone over 30 years  . CONVERSION TO TOTAL HIP Left 05/16/2018   Procedure: LEFT HIP ANTERIOR APPROACH;  Surgeon: Paralee Cancel, MD;  Location: [facility];  Service: Orthopedics;  Laterality: Left;  . FEMUR IM NAIL Right 06/16/2015   Procedure: INTRAMEDULLARY (IM) NAIL FEMORAL;  Surgeon: Renette Butters, MD;  Location: [facility];  Service: Orthopedics;  Laterality: Right;  . HARDWARE REMOVAL Right 09/24/2020   Procedure: REMOVAL OF IM ROD/HIP SCREW RIGHT HIP;  Surgeon: Mcarthur Rossetti,  MD;  Location: [facility];  Service: Orthopedics;  Laterality: Right;  . HARDWARE REMOVAL Right 12/24/2020   Procedure: HARDWARE REMOVAL RIGHT WRIST;  Surgeon: Leandrew Koyanagi, MD;  Location: [facility];  Service: Orthopedics;  Laterality: Right;  . HIP SURGERY    . JOINT REPLACEMENT    . ORIF RADIAL FRACTURE Right 12/24/2020   Procedure: OPEN REDUCTION INTERNAL FIXATION (ORIF) RIGHT RADIAL SHAFT FRACTURE;  Surgeon: Leandrew Koyanagi, MD;  Location: [facility];  Service: Orthopedics;  Laterality: Right;  . TOTAL HIP ARTHROPLASTY Right 09/24/2020   Procedure: RIGHT TOTAL HIP ARTHROPLASTY ANTERIOR APPROACH;  Surgeon: Mcarthur Rossetti,  MD;  Location: [facility];  Service: Orthopedics;  Laterality: Right;  . TUBAL LIGATION     Social History   Occupational History  . Not on file  Tobacco Use  . Smoking status: Former Smoker    Packs/day: 0.25    Types: Cigarettes    Quit date: 11/27/2005    Years since quitting: 15.1  . Smokeless tobacco: Never Used  Vaping Use  . Vaping Use: Never used  Substance and Sexual Activity  . Alcohol use: Yes    Alcohol/week: 14.0 standard drinks    Types: 14 Glasses of wine per week    Comment: scotch or wine daily  . Drug use: No  . Sexual activity: Not on file

## 2021-01-10 NOTE — H&P (Signed)
PREOPERATIVE H&P  Chief Complaint: right radial shaft Galeazzi fracture  HPI: Jeanne Smith is a 70 y.o. female who presents for surgical treatment of right radial shaft Galeazzi fracture.  She denies any changes in medical history.  Past Medical History:  Diagnosis Date  . Anxiety   . Arthritis    oa  . Attention deficit disorder   . Dysrhythmia 2019   episode of palpitations x 25 January 2018, stress test normal  . H/O seasonal allergies   . Hyperlipidemia   . Hypertension   . Hypothyroidism    thyroid nodules, off synthroid > 15 years  . Insomnia   . Osteoarthritis   . Osteopenia   . Thyroid nodule    Past Surgical History:  Procedure Laterality Date  . ABDOMINAL HYSTERECTOMY    . AUGMENTATION MAMMAPLASTY Bilateral    silicone over 30 years  . CONVERSION TO TOTAL HIP Left 05/16/2018   Procedure: LEFT HIP ANTERIOR APPROACH;  Surgeon: Paralee Cancel, MD;  Location: [facility];  Service: Orthopedics;  Laterality: Left;  . FEMUR IM NAIL Right 06/16/2015   Procedure: INTRAMEDULLARY (IM) NAIL FEMORAL;  Surgeon: Renette Butters, MD;  Location: [facility];  Service: Orthopedics;  Laterality: Right;  . HARDWARE REMOVAL Right 09/24/2020   Procedure: REMOVAL OF IM ROD/HIP SCREW RIGHT HIP;  Surgeon: Mcarthur Rossetti, MD;  Location: [facility];  Service: Orthopedics;  Laterality: Right;  . HARDWARE REMOVAL Right 12/24/2020   Procedure: HARDWARE REMOVAL RIGHT WRIST;  Surgeon: Leandrew Koyanagi, MD;  Location: [facility];  Service: Orthopedics;  Laterality: Right;  . HIP SURGERY    . JOINT REPLACEMENT    . ORIF RADIAL FRACTURE Right 12/24/2020   Procedure: OPEN REDUCTION INTERNAL FIXATION (ORIF) RIGHT RADIAL SHAFT FRACTURE;  Surgeon: Leandrew Koyanagi, MD;  Location: [facility];  Service: Orthopedics;  Laterality: Right;  . TOTAL HIP ARTHROPLASTY Right 09/24/2020   Procedure: RIGHT TOTAL HIP ARTHROPLASTY ANTERIOR APPROACH;  Surgeon: Mcarthur Rossetti, MD;   Location: [facility];  Service: Orthopedics;  Laterality: Right;  . TUBAL LIGATION     Social History   Socioeconomic History  . Marital status: Widowed    Spouse name: Not on file  . Number of children: 3  . Years of education: Not on file  . Highest education level: Not on file  Occupational History  . Not on file  Tobacco Use  . Smoking status: Former Smoker    Packs/day: 0.25    Types: Cigarettes    Quit date: 11/27/2005    Years since quitting: 15.1  . Smokeless tobacco: Never Used  Vaping Use  . Vaping Use: Never used  Substance and Sexual Activity  . Alcohol use: Yes    Alcohol/week: 14.0 standard drinks    Types: 14 Glasses of wine per week    Comment: scotch or wine daily  . Drug use: No  . Sexual activity: Not on file  Other Topics Concern  . Not on file  Social History Narrative  . Not on file   Social Determinants of Health   Financial Resource Strain: Not on file  Food Insecurity: Not on file  Transportation Needs: Not on file  Physical Activity: Not on file  Stress: Not on file  Social Connections: Not on file   Family History  Problem Relation Age of Onset  . Heart failure Mother   . Hypertension Father   . Cancer Father   . Stroke Father   .  Heart disease Brother   . Alcoholism Brother    No Known Allergies Prior to Admission medications   Medication Sig Start Date End Date Taking? Authorizing Provider  ALPRAZolam (XANAX) 0.25 MG tablet TAKE 1 TABLET(0.25 MG) BY MOUTH TWICE DAILY AS NEEDED FOR ANXIETY Patient taking differently: Take 0.25 mg by mouth 2 (two) times daily as needed for anxiety. 11/15/20  Yes Baxley, Cresenciano Lick, MD  Ascorbic Acid (VITAMIN C) 500 MG CHEW Chew 1 tablet (500 mg total) by mouth 2 (two) times daily. 12/24/20  Yes Leandrew Koyanagi, MD  aspirin 81 MG chewable tablet Chew 1 tablet (81 mg total) by mouth 2 (two) times daily. Patient taking differently: Chew 81 mg by mouth daily. 09/25/20  Yes Mcarthur Rossetti, MD  b complex  vitamins tablet Take 1 tablet by mouth daily.   Yes [provider]  Calcium Citrate-Vitamin D (CALCIUM CITRATE + D PO) Take 2 tablets by mouth at bedtime. Mag   Yes [provider]  cycloSPORINE (RESTASIS) 0.05 % ophthalmic emulsion Place 1 drop into both eyes 2 (two) times daily.    Yes [provider]  estradiol (ESTRACE) 2 MG tablet Take 2 mg by mouth at bedtime.  04/21/19  Yes [provider]  finasteride (PROSCAR) 5 MG tablet Take 2.5 mg by mouth at bedtime.  01/03/19  Yes [provider]  hydrochlorothiazide (HYDRODIURIL) 25 MG tablet TAKE 1 TABLET(25 MG) BY MOUTH DAILY Patient taking differently: Take 25 mg by mouth daily. 12/01/20  Yes Baxley, Cresenciano Lick, MD  Hypromellose, PF, (RETAINE HPMC) 0.3 % SOLN Place 1 drop into both eyes daily as needed (Dry eyes).    Yes [provider]  Magnesium 400 MG TABS Take 400 mg by mouth at bedtime.   Yes [provider]  Multiple Vitamins-Minerals (MULTIVITAMIN WITH MINERALS) tablet Take 1 tablet by mouth daily.   Yes [provider]  olmesartan (BENICAR) 40 MG tablet TAKE 1 TABLET(40 MG) BY MOUTH DAILY Patient taking differently: Take 40 mg by mouth daily. 11/17/20  Yes Baxley, Cresenciano Lick, MD  Omega-3 Fatty Acids (EQL OMEGA 3 FISH OIL PO) Take 565 mg by mouth daily. Complete   Yes [provider]  oxyCODONE-acetaminophen (PERCOCET) 5-325 MG tablet Take 1-2 tablets by mouth every 8 (eight) hours as needed for severe pain. 12/24/20  Yes Leandrew Koyanagi, MD  polyethylene glycol (MIRALAX / GLYCOLAX) packet Take 17 g by mouth 2 (two) times daily. Patient taking differently: Take 17 g by mouth daily as needed for mild constipation. 05/17/18  Yes Babish, Rodman Key, PA-C  rosuvastatin (CRESTOR) 10 MG tablet Take 1 tablet (10 mg total) by mouth at bedtime. 07/23/20 07/18/21 Yes Baxley, Cresenciano Lick, MD  Saline 0.9 % AERS Place 1 spray into both nostrils 2 (two) times daily as needed (congestion).    Yes  [provider]  tiZANidine (ZANAFLEX) 4 MG tablet Take 1 tablet (4 mg total) by mouth every 8 (eight) hours as needed for muscle spasms. 12/16/20  Yes Mcarthur Rossetti, MD  doxycycline (VIBRA-TABS) 100 MG tablet Take 1 tablet (100 mg total) by mouth 2 (two) times daily. 10/27/20   Mcarthur Rossetti, MD  meloxicam (MOBIC) 15 MG tablet Take 1 tablet (15 mg total) by mouth daily. 12/16/20   Mcarthur Rossetti, MD  methocarbamol (ROBAXIN) 500 MG tablet Take 1 tablet (500 mg total) by mouth 3 (three) times daily. 10/08/20   Pete Pelt, PA-C  oxyCODONE (OXY IR/ROXICODONE) 5 MG  immediate release tablet Take 1-2 tablets (5-10 mg total) by mouth every 6 (six) hours as needed for moderate pain (pain score 4-6). 11/01/20   Mcarthur Rossetti, MD  traMADol (ULTRAM) 50 MG tablet Take 2 tablets (100 mg total) by mouth every 6 (six) hours as needed. Patient taking differently: Take 100 mg by mouth every 6 (six) hours as needed for moderate pain. 12/16/20   Mcarthur Rossetti, MD  zolpidem (AMBIEN) 10 MG tablet TAKE 1 TABLET(10 MG) BY MOUTH AT BEDTIME AS NEEDED FOR SLEEP 12/22/20   Elby Showers, MD     Positive ROS: All other systems have been reviewed and were otherwise negative with the exception of those mentioned in the HPI and as above.  Physical Exam: General: Alert, no acute distress Cardiovascular: No pedal edema Respiratory: No cyanosis, no use of accessory musculature GI: abdomen soft Skin: No lesions in the area of chief complaint Neurologic: Sensation intact distally Psychiatric: Patient is competent for consent with normal mood and affect Lymphatic: no lymphedema  MUSCULOSKELETAL: exam stable  Assessment: right radial shaft Galeazzi fracture  Plan: Plan for Procedure(s): HARDWARE REMOVAL RIGHT WRIST OPEN REDUCTION INTERNAL FIXATION (ORIF) RIGHT RADIAL SHAFT FRACTURE  The risks benefits and alternatives were discussed with the patient including  but not limited to the risks of nonoperative treatment, versus surgical intervention including infection, bleeding, nerve injury,  blood clots, cardiopulmonary complications, morbidity, mortality, among others, and they were willing to proceed.   Preoperative templating of the joint replacement has been completed, documented, and submitted to the Operating Room personnel in order to optimize intra-operative equipment management.   Eduard Roux, MD 01/10/2021 5:33 PM

## 2021-01-13 ENCOUNTER — Encounter: Payer: Self-pay | Admitting: Orthopaedic Surgery

## 2021-01-13 ENCOUNTER — Ambulatory Visit (INDEPENDENT_AMBULATORY_CARE_PROVIDER_SITE_OTHER): Payer: Medicare Other | Admitting: Orthopaedic Surgery

## 2021-01-13 DIAGNOSIS — Z96641 Presence of right artificial hip joint: Secondary | ICD-10-CM

## 2021-01-13 NOTE — Progress Notes (Signed)
The patient is now 3 and half months status post hardware removal from her right hip and conversion to a total hip arthroplasty.  She says the hip is finally doing well.  Since I saw her last she did have a hard mechanical fall injuring her right forearm have my partner Dr. Erlinda Hong performed surgery on the forearm.  She is doing well with both areas.  She is walking without assistive device and said that things are much less painful for her.  She said the incision site is still tender to the touch.  On examination of her right hip incisions they will look good thus far.  There is no redness or drainage or evidence of breakdown.  Her leg lengths are near equal as well.  She lets me put her right hip through internal and external rotation with no significant issues at all.  From my standpoint I do not need to see her now for 6 months unless she is having any issues with that right hip.  At that visit I like a standing low AP pelvis and lateral of her right hip.

## 2021-01-14 ENCOUNTER — Other Ambulatory Visit: Payer: Medicare Other | Admitting: Internal Medicine

## 2021-01-17 ENCOUNTER — Encounter: Payer: Medicare Other | Admitting: Internal Medicine

## 2021-01-17 ENCOUNTER — Other Ambulatory Visit: Payer: Medicare Other | Admitting: Internal Medicine

## 2021-01-17 ENCOUNTER — Other Ambulatory Visit: Payer: Self-pay

## 2021-01-17 ENCOUNTER — Ambulatory Visit (INDEPENDENT_AMBULATORY_CARE_PROVIDER_SITE_OTHER): Payer: Medicare Other | Admitting: Internal Medicine

## 2021-01-17 ENCOUNTER — Telehealth: Payer: Self-pay

## 2021-01-17 ENCOUNTER — Encounter: Payer: Self-pay | Admitting: Internal Medicine

## 2021-01-17 VITALS — BP 130/88 | HR 112 | Ht 63.5 in | Wt 134.0 lb

## 2021-01-17 DIAGNOSIS — E042 Nontoxic multinodular goiter: Secondary | ICD-10-CM | POA: Diagnosis not present

## 2021-01-17 DIAGNOSIS — S62101D Fracture of unspecified carpal bone, right wrist, subsequent encounter for fracture with routine healing: Secondary | ICD-10-CM | POA: Diagnosis not present

## 2021-01-17 DIAGNOSIS — F411 Generalized anxiety disorder: Secondary | ICD-10-CM

## 2021-01-17 DIAGNOSIS — E782 Mixed hyperlipidemia: Secondary | ICD-10-CM

## 2021-01-17 DIAGNOSIS — F988 Other specified behavioral and emotional disorders with onset usually occurring in childhood and adolescence: Secondary | ICD-10-CM

## 2021-01-17 DIAGNOSIS — E785 Hyperlipidemia, unspecified: Secondary | ICD-10-CM

## 2021-01-17 DIAGNOSIS — Z0000 Encounter for general adult medical examination without abnormal findings: Secondary | ICD-10-CM

## 2021-01-17 DIAGNOSIS — R7302 Impaired glucose tolerance (oral): Secondary | ICD-10-CM

## 2021-01-17 DIAGNOSIS — I10 Essential (primary) hypertension: Secondary | ICD-10-CM | POA: Diagnosis not present

## 2021-01-17 DIAGNOSIS — R748 Abnormal levels of other serum enzymes: Secondary | ICD-10-CM

## 2021-01-17 DIAGNOSIS — F439 Reaction to severe stress, unspecified: Secondary | ICD-10-CM | POA: Diagnosis not present

## 2021-01-17 DIAGNOSIS — R739 Hyperglycemia, unspecified: Secondary | ICD-10-CM | POA: Diagnosis not present

## 2021-01-17 DIAGNOSIS — E2839 Other primary ovarian failure: Secondary | ICD-10-CM | POA: Diagnosis not present

## 2021-01-17 DIAGNOSIS — G4709 Other insomnia: Secondary | ICD-10-CM

## 2021-01-17 DIAGNOSIS — M1611 Unilateral primary osteoarthritis, right hip: Secondary | ICD-10-CM

## 2021-01-17 DIAGNOSIS — R7989 Other specified abnormal findings of blood chemistry: Secondary | ICD-10-CM | POA: Diagnosis not present

## 2021-01-17 MED ORDER — ALPRAZOLAM 0.25 MG PO TABS
ORAL_TABLET | ORAL | 0 refills | Status: DC
Start: 2021-01-17 — End: 2022-05-17

## 2021-01-17 MED ORDER — ZOLPIDEM TARTRATE 10 MG PO TABS: ORAL_TABLET | ORAL | 0 refills | Status: DC

## 2021-01-17 NOTE — Patient Instructions (Addendum)
RTC in 4 weeks. Have thyroid ultrasound. Decrease alcohol consumption.  See counselor regarding situational stress.

## 2021-01-17 NOTE — Telephone Encounter (Signed)
Faxed to provided number  

## 2021-01-17 NOTE — Telephone Encounter (Signed)
Patient called she stated she has a appointment with her dentist Dr. Shiela Mayer, DDS march 15,2022 she stated the office is requesting info about patient needing antibiotics for any procedures to be done the office if yes patient is requesting antibiotics to be sent to [facility] on [address]  Call back:[phone] Dental office:Phone [phone] Fax [phone]

## 2021-01-17 NOTE — Progress Notes (Signed)
Subjective:    Patient ID: Jeanne Smith, female    DOB: 11/03/1951, 70 y.o.   MRN: [id number]  HPI 70 year old Female seen for health maintenance exam and evaluation of medical issues. She has had a rough time lately. She suffered a right wrist fracture in january secondary to an accident at home, her son is in rehab for alcoholism in [place], she had right  hip arthroplasty October 29,2021, had a fall at home November 23 striking hip and face. A seroma developed in the hip area that was drained by Dr. Ninfa Linden December 1st.   Admits to situational stress with son in rehab, worried about recovery from hip and now wrist fracture, and most recently a 59 year old boy whose family is close friends with  her daughter and son-in law in [place] died from a tragic fall in [place]. Her daughter Chrys Racer lives here and is supportive as well as her female friend, Forensic scientist.  She admits to drinking about 3 alcoholic drinks a night and is not sleeping well. Taking Xanax as well in addition to Ambien. Told her today, I would not continue prescribing these meds since she is drinking. She is tremulous today in the office. She will need to see psychiatrist . Agreed to give her 30 day Rxs for Xanax and Ambien today. She needs to cut back on her drinking as her LFTs are elevated.  Her bilirubin is 1.7, alkaline phosphatase 167, SGOT 166 and SGPT 106.  This represents an improvement from 3 weeks ago.  However her glucose is elevated fasting at 136.  I have asked lab to add a hemoglobin A1c.  We can repeat these labs in 4 weeks.  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  History of attention deficit disorder and used to take Adderall but no longer takes this.  In July 2016 she suffered a comminuted intertrochanteric fracture of right femur and comminuted fracture of right femoral head.  This occurred after a fall on concrete while holding her dog.  Fractured required nailing by  Dr. Christia Reading.  She had left hip arthroplasty by Dr. Alvan Dame in June 2019.  History of labile hypertension and has seen by Dr. Peter Martinique for control of blood pressure in the past.  History of right thyroid nodule initially worked up in 1994 showing a hot nodule by thyroid gland.  It was aspirated in 1995 and was found to be benign.  Repeat thyroid scan in 1998 showed a high right thyroid nodule in the right lower lobe.  Ultrasound in May 1998 revealed a 3.8 nodule in the right lower lobe compatible with a functioning adenoma.  She has seen Dr. Forde Dandy in the past.  Last ultrasound of thyroid was in 2008 showing a dominant nodule in the right lobe near the isthmus measuring 3.1 x 1.1 x 2.1 cm.  Other nodules are present bilaterally.  Needs to get ultrasound in the near future.  History of uterine fibroids and ovarian cyst.  History of cervical dysplasia.  Tonsillectomy as a child.  Right lateral breast lymph node excision by Dr. Mendel Ryder in 1997 which was benign.  D&C after miscarriage in 1995.  Bilateral tubal ligation in 1995.  Breast augmentation 1998 by Dr. Dessie Coma.  Removal of endocervical polyp by Dr. Nori Riis February 1999.  She smoked occasionally in college.  Drinks wine nightly.  She is a former Writer.  She is a widow.  No known drug  allergies  Hysterectomy Mar 03, 2005  Neck injury March 03, 2004  Family history: Father died at age 25 with hypertension, prostate and lung cancer.  Mother fairly healthy but elderly.  Paternal grandmother with thyroid disease.  1 brother died at age 27 in 73 with history of alcoholism and experienced a spontaneous cardiac arrest.  Adult son who is an alcoholic and treatment.  2 adult daughters.  Review of Systems has had considerable issues trying to maneuver with right arm in the cast/splint.  Distraught over 81 year old child's passing from an accident who was a friend of her daughter's family.  Some worry about how her son will manage after rehab.      Objective:   Physical Exam Blood pressure 130/88 pulse 112 pulse oximetry 97% weight 134 pounds BMI 23.36 She is tremulous in the office today.  TMs are clear.  Pharynx is clear.  Neck is supple without JVD thyromegaly or carotid bruits.  Chest clear to auscultation.  Cardiac exam tachycardia regular rate and rhythm.  Abdomen soft nondistended without hepatosplenomegaly masses or tenderness.  Bilateral breast implants.  GYN exam deferred to GYN physician.  No lower extremity pitting edema.  Neuro: She is anxious in the office today.       Assessment & Plan:  Situational stress  I am concerned about her elevated liver functions but they seem to be improving over the past 3 weeks or so.  We can repeat them in 4 weeks.  She needs to cut back on her drinking.  I have agreed to give her 30 days of Xanax and Ambien since she is not sleeping at night but I want a psychiatrist to evaluate her and take over the prescribing of these medications.  She has a history of thyroid nodules and she needs a thyroid ultrasound in the upcoming future.  Status post right hip arthroplasty  Status post right wrist fracture  Elevated serum glucose-add hemoglobin A1c  Plan: Follow-up in 4 weeks.  She tells me she will be seeing Lennart Pall for counseling  Subjective:   Patient presents for Medicare Annual/Subsequent preventive examination.  Review Past Medical/Family/Social: See above   Risk Factors  Current exercise habits: Currently not exercising exercising-recovering from hip issues and wrist fracture Dietary issues discussed: Not today  Cardiac risk factors: Hyperlipidemia  Depression Screen  (Note: if answer to either of the following is "Yes", a more complete depression screening is indicated)   Over the past two weeks, have you felt down, depressed or hopeless? No  Over the past two weeks, have you felt little interest or pleasure in doing things? No Have you lost interest or pleasure in  daily life? No Do you often feel hopeless? No Do you cry easily over simple problems? No   Activities of Daily Living  In your present state of health, do you have any difficulty performing the following activities?:   Driving? No  Managing money? No  Feeding yourself?  Yes because I fractured my right wrist Getting from bed to chair? No  Climbing a flight of stairs? No  Preparing food and eating?:  Yes Bathing or showering? No  Getting dressed: No  Getting to the toilet? No  Using the toilet:No  Moving around from place to place: No  In the past year have you fallen or had a near fall?:  Yes Are you sexually active?  Yes Do you have more than one partner? No   Hearing Difficulties: No  Do you often ask people to speak  up or repeat themselves? No  Do you experience ringing or noises in your ears? No  Do you have difficulty understanding soft or whispered voices? No  Do you feel that you have a problem with memory? No Do you often misplace items? No    Home Safety:  Do you have a smoke alarm at your residence? Yes Do you have grab bars in the bathroom?  Yes Do you have throw rugs in your house?  No   Cognitive Testing  Alert? Yes Normal Appearance?Yes  Oriented to person? Yes Place? Yes  Time? Yes  Recall of three objects?  Not tested Can perform simple calculations? Yes  Displays appropriate judgment?Yes  Can read the correct time from a watch face?Yes   List the Names of Other Physician/Practitioners you currently use:  See referral list for the physicians patient is currently seeing.     Review of Systems: See above   Objective:     General appearance: Appears stated age and frail.  She is tremulous Head: Normocephalic, without obvious abnormality, atraumatic  Eyes: conj clear, EOMi PEERLA  Ears: normal TM's and external ear canals both ears  Nose: Nares normal. Septum midline. Mucosa normal. No drainage or sinus tenderness.  Throat: lips, mucosa, and  tongue normal; teeth and gums normal  Neck: no adenopathy, no carotid bruit, no JVD, supple, symmetrical, trachea midline and thyroid not enlarged, symmetric, no tenderness/mass/nodules  No CVA tenderness.  Lungs: clear to auscultation bilaterally  Breasts: Bilateral implants Heart: Tachycardic, regular rate and rhythm, S1, S2 normal, no murmur, click, rub or gallop  Abdomen: soft, non-tender; bowel sounds normal; no masses, no organomegaly  Musculoskeletal: ROM normal in all joints, no crepitus, no deformity, Normal muscle strengthen. Back  is symmetric, no curvature. Skin: Skin color, texture, turgor normal. No rashes or lesions  Lymph nodes: Cervical, supraclavicular, and axillary nodes normal.  Neurologic: CN 2 -12 Normal, Normal symmetric reflexes. Normal coordination and gait  Psych: Alert & Oriented x 3, Mood appear stable.    Assessment:    Annual wellness medicare exam   Plan:    During the course of the visit the patient was educated and counseled about appropriate screening and preventive services including:   Has had 3 COVID-19 immunizations.  Has had pneumococcal 13 and pneumococcal 23 vaccines.     Patient Instructions (the written plan) was given to the patient.  Medicare Attestation  I have personally reviewed:  The patient's medical and social history  Their use of alcohol, tobacco or illicit drugs  Their current medications and supplements  The patient's functional ability including ADLs,fall risks, home safety risks, cognitive, and hearing and visual impairment  Diet and physical activities  Evidence for depression or mood disorders  The patient's weight, height, BMI, and visual acuity have been recorded in the chart. I have made referrals, counseling, and provided education to the patient based on review of the above and I have provided the patient with a written personalized care plan for preventive services.

## 2021-01-18 ENCOUNTER — Other Ambulatory Visit: Payer: Self-pay

## 2021-01-18 DIAGNOSIS — R7989 Other specified abnormal findings of blood chemistry: Secondary | ICD-10-CM

## 2021-01-18 DIAGNOSIS — E042 Nontoxic multinodular goiter: Secondary | ICD-10-CM

## 2021-01-18 DIAGNOSIS — R739 Hyperglycemia, unspecified: Secondary | ICD-10-CM

## 2021-01-18 LAB — TEST AUTHORIZATION

## 2021-01-18 LAB — COMPLETE METABOLIC PANEL WITH GFR
AG Ratio: 1.3 (calc) (ref 1.0–2.5)
ALT: 106 U/L — ABNORMAL HIGH (ref 6–29)
AST: 166 U/L — ABNORMAL HIGH (ref 10–35)
Albumin: 4.1 g/dL (ref 3.6–5.1)
Alkaline phosphatase (APISO): 167 U/L — ABNORMAL HIGH (ref 37–153)
BUN: 17 mg/dL (ref 7–25)
CO2: 26 mmol/L (ref 20–32)
Calcium: 9.7 mg/dL (ref 8.6–10.4)
Chloride: 98 mmol/L (ref 98–110)
Creat: 0.8 mg/dL (ref 0.60–0.93)
GFR, Est African American: 87 mL/min/{1.73_m2} (ref >=60)
GFR, Est Non African American: 75 mL/min/{1.73_m2} (ref >=60)
Globulin: 3.1 g/dL (calc) (ref 1.9–3.7)
Glucose, Bld: 136 mg/dL — ABNORMAL HIGH (ref 65–99)
Potassium: 4.1 mmol/L (ref 3.5–5.3)
Sodium: 136 mmol/L (ref 135–146)
Total Bilirubin: 1.7 mg/dL — ABNORMAL HIGH (ref 0.2–1.2)
Total Protein: 7.2 g/dL (ref 6.1–8.1)

## 2021-01-18 LAB — CBC WITH DIFFERENTIAL/PLATELET
Absolute Monocytes: 649 cells/uL (ref 200–950)
Basophils Absolute: 47 cells/uL (ref 0–200)
Basophils Relative: 0.8 %
Eosinophils Absolute: 248 cells/uL (ref 15–500)
Eosinophils Relative: 4.2 %
HCT: 42.8 % (ref 35.0–45.0)
Hemoglobin: 15 g/dL (ref 11.7–15.5)
Lymphs Abs: 1581 cells/uL (ref 850–3900)
MCH: 35 pg — ABNORMAL HIGH (ref 27.0–33.0)
MCHC: 35 g/dL (ref 32.0–36.0)
MCV: 100 fL (ref 80.0–100.0)
MPV: 12.1 fL (ref 7.5–12.5)
Monocytes Relative: 11 %
Neutro Abs: 3375 cells/uL (ref 1500–7800)
Neutrophils Relative %: 57.2 %
Platelets: 153 10*3/uL (ref 140–400)
RBC: 4.28 10*6/uL (ref 3.80–5.10)
RDW: 13.1 % (ref 11.0–15.0)
Total Lymphocyte: 26.8 %
WBC: 5.9 10*3/uL (ref 3.8–10.8)

## 2021-01-18 LAB — HEMOGLOBIN A1C
Hgb A1c MFr Bld: 5 % of total Hgb (ref <5.7)
Mean Plasma Glucose: 97 mg/dL
eAG (mmol/L): 5.4 mmol/L

## 2021-01-18 LAB — LIPID PANEL
Cholesterol: 290 mg/dL — ABNORMAL HIGH (ref <200)
HDL: 108 mg/dL (ref >=50)
LDL Cholesterol (Calc): 152 mg/dL (calc) — ABNORMAL HIGH
Non-HDL Cholesterol (Calc): 182 mg/dL (calc) — ABNORMAL HIGH (ref <130)
Total CHOL/HDL Ratio: 2.7 (calc) (ref <5.0)
Triglycerides: 161 mg/dL — ABNORMAL HIGH (ref <150)

## 2021-01-18 LAB — T4, FREE: Free T4: 1.2 ng/dL (ref 0.8–1.8)

## 2021-01-18 LAB — TSH: TSH: 0.89 mIU/L (ref 0.40–4.50)

## 2021-01-18 LAB — GAMMA GT: GGT: 1193 U/L — ABNORMAL HIGH (ref 3–65)

## 2021-01-18 NOTE — Addendum Note (Signed)
Addended by: Mady Haagensen on: 01/18/2021 10:09 AM   Modules accepted: Orders

## 2021-01-27 ENCOUNTER — Other Ambulatory Visit: Payer: Medicare Other

## 2021-02-03 ENCOUNTER — Telehealth: Payer: Self-pay

## 2021-02-03 ENCOUNTER — Ambulatory Visit (INDEPENDENT_AMBULATORY_CARE_PROVIDER_SITE_OTHER): Payer: Medicare Other | Admitting: Orthopaedic Surgery

## 2021-02-03 ENCOUNTER — Encounter: Payer: Self-pay | Admitting: Orthopaedic Surgery

## 2021-02-03 ENCOUNTER — Ambulatory Visit (INDEPENDENT_AMBULATORY_CARE_PROVIDER_SITE_OTHER): Payer: Medicare Other

## 2021-02-03 VITALS — Ht 63.5 in | Wt 134.0 lb

## 2021-02-03 DIAGNOSIS — M25531 Pain in right wrist: Secondary | ICD-10-CM

## 2021-02-03 DIAGNOSIS — S52372A Galeazzi's fracture of left radius, initial encounter for closed fracture: Secondary | ICD-10-CM

## 2021-02-03 NOTE — Progress Notes (Signed)
Post-Op Visit Note   Patient: Jeanne Smith           Date of Birth: Jan 31, 1951           MRN: [id number] Visit Date: 02/03/2021 PCP: Elby Showers, MD   Assessment & Plan:  Chief Complaint:  Chief Complaint  Patient presents with  . Right Wrist - Follow-up    Right wrist hardware removal 12/24/2020   Visit Diagnoses:  1. Closed Galeazzi's fracture of left radius, initial encounter   2. Pain in right wrist     Plan:   Ms. Vanalstine is 6 weeks status post ORIF right radial shaft fracture and hardware removal.  Overall doing well.  She has not had been taking any oxycodone for quite some time.  Right forearm shows a fully healed surgical scar.  She has near normal pronation supination wrist extension flexion and elbow flexion and extension.  X-rays demonstrate stable fixation without hardware complication and continued fracture consolidation.  I am pleased with her progress and the fracture healing so far.  She can gradually increase activity to her right arm as tolerated.  Weight-bear as tolerated.  Recheck in 6 weeks with two-view x-rays of the right wrist.  Follow-Up Instructions: Return in about 6 weeks (around 03/17/2021).   Orders:  Orders Placed This Encounter  Procedures  . XR Wrist 2 Views Right   No orders of the defined types were placed in this encounter.   Imaging: XR Wrist 2 Views Right  Result Date: 02/03/2021 Stable fixation of radial shaft fracture.  There is evidence of fracture consolidation   PMFS History: Patient Active Problem List   Diagnosis Date Noted  . Closed Galeazzi's fracture of left radius 12/21/2020  . 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  . Retained orthopedic hardware 06/15/2020  . Stenosing tenosynovitis of wrist 04/01/2019  . Closed fracture of right wrist 01/29/2019  . Pain in right wrist 01/21/2019  . Pain in right hand 01/21/2019  . 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 S/P hip replacement 05/16/2018  . Knee pain  03/28/2018  . Pure hypercholesterolemia 12/24/2017  . Intertrochanteric fracture of right femur ([place]) 06/15/2015  . Comminuted right humeral fracture 06/15/2015  . HTN (hypertension) 06/15/2015   Past Medical History:  Diagnosis Date  . Anxiety   . Arthritis    oa  . Attention deficit disorder   . Dysrhythmia 2019   episode of palpitations x 25 January 2018, stress test normal  . H/O seasonal allergies   . Hyperlipidemia   . Hypertension   . Hypothyroidism    thyroid nodules, off synthroid > 15 years  . Insomnia   . Osteoarthritis   . Osteopenia   . Thyroid nodule     Family History  Problem Relation Age of Onset  . Heart failure Mother   . Hypertension Father   . Cancer Father   . Stroke Father   . Heart disease Brother   . Alcoholism Brother     Past Surgical History:  Procedure Laterality Date  . ABDOMINAL HYSTERECTOMY    . AUGMENTATION MAMMAPLASTY Bilateral    silicone over 30 years  . CONVERSION TO TOTAL HIP Left 05/16/2018   Procedure: LEFT HIP ANTERIOR APPROACH;  Surgeon: Paralee Cancel, MD;  Location: [facility];  Service: Orthopedics;  Laterality: Left;  . FEMUR IM NAIL Right 06/16/2015   Procedure: INTRAMEDULLARY (IM) NAIL FEMORAL;  Surgeon: Renette Butters, MD;  Location: [place];  Service: Orthopedics;  Laterality: Right;  . HARDWARE REMOVAL Right 09/24/2020   Procedure: REMOVAL OF IM ROD/HIP SCREW RIGHT HIP;  Surgeon: Mcarthur Rossetti, MD;  Location: [facility];  Service: Orthopedics;  Laterality: Right;  . HARDWARE REMOVAL Right 12/24/2020   Procedure: HARDWARE REMOVAL RIGHT WRIST;  Surgeon: Leandrew Koyanagi, MD;  Location: [place];  Service: Orthopedics;  Laterality: Right;  . HIP SURGERY    . JOINT REPLACEMENT    . ORIF RADIAL FRACTURE Right 12/24/2020   Procedure: OPEN REDUCTION INTERNAL FIXATION (ORIF) RIGHT RADIAL SHAFT FRACTURE;  Surgeon: Leandrew Koyanagi, MD;  Location: [place];  Service: Orthopedics;  Laterality: Right;  .  TOTAL HIP ARTHROPLASTY Right 09/24/2020   Procedure: RIGHT TOTAL HIP ARTHROPLASTY ANTERIOR APPROACH;  Surgeon: Mcarthur Rossetti, MD;  Location: [facility];  Service: Orthopedics;  Laterality: Right;  . TUBAL LIGATION     Social History   Occupational History  . Not on file  Tobacco Use  . Smoking status: Former Smoker    Packs/day: 0.25    Types: Cigarettes    Quit date: 11/27/2005    Years since quitting: 15.1  . Smokeless tobacco: Never Used  Vaping Use  . Vaping Use: Never used  Substance and Sexual Activity  . Alcohol use: Yes    Alcohol/week: 14.0 standard drinks    Types: 14 Glasses of wine per week    Comment: scotch or wine daily  . Drug use: No  . Sexual activity: Not on file

## 2021-02-03 NOTE — Telephone Encounter (Signed)
Patient came into the office she is requesting rx refill for tramadol call [phone]

## 2021-02-03 NOTE — Telephone Encounter (Signed)
Xu patient

## 2021-02-04 ENCOUNTER — Other Ambulatory Visit: Payer: Self-pay | Admitting: Physician Assistant

## 2021-02-04 MED ORDER — TRAMADOL HCL 50 MG PO TABS: 50.0000 mg | ORAL_TABLET | Freq: Three times a day (TID) | ORAL | 0 refills | Status: DC | PRN

## 2021-02-04 NOTE — Telephone Encounter (Signed)
Sent in

## 2021-02-07 NOTE — Telephone Encounter (Signed)
Pt called her appt is tomorrow and her dentist called stating they never received the fax could we please fax this again ?

## 2021-02-07 NOTE — Telephone Encounter (Signed)
Faxed to provided number  

## 2021-02-09 ENCOUNTER — Ambulatory Visit: Payer: Medicare Other | Admitting: Physician Assistant

## 2021-03-01 ENCOUNTER — Other Ambulatory Visit: Payer: Medicare Other | Admitting: Internal Medicine

## 2021-03-03 ENCOUNTER — Encounter: Payer: Medicare Other | Admitting: Internal Medicine

## 2021-03-17 ENCOUNTER — Other Ambulatory Visit: Payer: Self-pay

## 2021-03-17 ENCOUNTER — Ambulatory Visit (INDEPENDENT_AMBULATORY_CARE_PROVIDER_SITE_OTHER): Payer: Medicare Other | Admitting: Orthopaedic Surgery

## 2021-03-17 ENCOUNTER — Ambulatory Visit (INDEPENDENT_AMBULATORY_CARE_PROVIDER_SITE_OTHER): Payer: Medicare Other

## 2021-03-17 ENCOUNTER — Encounter: Payer: Self-pay | Admitting: Orthopaedic Surgery

## 2021-03-17 VITALS — Ht 63.5 in | Wt 134.0 lb

## 2021-03-17 DIAGNOSIS — M25531 Pain in right wrist: Secondary | ICD-10-CM | POA: Diagnosis not present

## 2021-03-17 NOTE — Progress Notes (Signed)
Post-Op Visit Note   Patient: Jeanne Smith           Date of Birth: 1951/10/30           MRN: [id number] Visit Date: 03/17/2021 PCP: Elby Showers, MD   Assessment & Plan:  Chief Complaint:  Chief Complaint  Patient presents with  . Right Wrist - Follow-up    Right wrist hardware removal 12/24/2020   Visit Diagnoses:  1. Pain in right wrist     Plan: Patient is a very pleasant 70 year old female who comes in today approximately 12 weeks out right wrist hardware removal and ORIF right radial shaft fracture.  She has been doing well.  She still notes some discomfort to the distal radius with certain motions, but otherwise doing very well.  She has been wearing her wrist splint and coming out for range of motion exercises.  Examination of her right forearm reveals a fully healed surgical scar without complication.  She has mild tenderness over the first dorsal compartment.  Negative Finkelstein.  Full range of motion of the wrist and elbow.  She is neurovascularly intact distally.  At this point, her fracture has healed.  She may discontinue her splint.  She may advance activity as tolerated.  Follow-up with [place] as needed.  Follow-Up Instructions: Return if symptoms worsen or fail to improve.   Orders:  Orders Placed This Encounter  Procedures  . XR Wrist 2 Views Right   No orders of the defined types were placed in this encounter.   Imaging: XR Wrist 2 Views Right  Result Date: 03/17/2021 X-rays reveal consolidation to the fracture site without hardware complication.   PMFS History: Patient Active Problem List   Diagnosis Date Noted  . Closed Galeazzi's fracture of left radius 12/21/2020  . 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  . Retained orthopedic hardware 06/15/2020  . Stenosing tenosynovitis of wrist 04/01/2019  . Closed fracture of right wrist 01/29/2019  . Pain in right wrist 01/21/2019  . Pain in right hand 01/21/2019  . S/P left THA, AA  05/16/2018  . S/P hip replacement 05/16/2018  . Knee pain 03/28/2018  . Pure hypercholesterolemia 12/24/2017  . Intertrochanteric fracture of right femur ([place]) 06/15/2015  . Comminuted right humeral fracture 06/15/2015  . HTN (hypertension) 06/15/2015   Past Medical History:  Diagnosis Date  . Anxiety   . Arthritis    oa  . Attention deficit disorder   . Dysrhythmia 2019   episode of palpitations x 25 January 2018, stress test normal  . H/O seasonal allergies   . Hyperlipidemia   . Hypertension   . Hypothyroidism    thyroid nodules, off synthroid > 15 years  . Insomnia   . Osteoarthritis   . Osteopenia   . Thyroid nodule     Family History  Problem Relation Age of Onset  . Heart failure Mother   . Hypertension Father   . Cancer Father   . Stroke Father   . Heart disease Brother   . Alcoholism Brother     Past Surgical History:  Procedure Laterality Date  . ABDOMINAL HYSTERECTOMY    . AUGMENTATION MAMMAPLASTY Bilateral    silicone over 30 years  . CONVERSION TO TOTAL HIP Left 05/16/2018   Procedure: LEFT HIP ANTERIOR APPROACH;  Surgeon: Paralee Cancel, MD;  Location[facility];  Service: Orthopedics;  Laterality: Left;  . FEMUR IM NAIL Right 06/16/2015   Procedure: INTRAMEDULLARY (IM) NAIL FEMORAL;  Surgeon:  Renette Butters, MD;  Location: [facility];  Service: Orthopedics;  Laterality: Right;  . HARDWARE REMOVAL Right 09/24/2020   Procedure: REMOVAL OF IM ROD/HIP SCREW RIGHT HIP;  Surgeon: Mcarthur Rossetti, MD;  [facility];  Service: Orthopedics;  Laterality: Right;  . HARDWARE REMOVAL Right 12/24/2020   Procedure: HARDWARE REMOVAL RIGHT WRIST;  Surgeon: Leandrew Koyanagi, MD;  Location: [facility];  Service: Orthopedics;  Laterality: Right;  . HIP SURGERY    . JOINT REPLACEMENT    . ORIF RADIAL FRACTURE Right 12/24/2020   Procedure: OPEN REDUCTION INTERNAL FIXATION (ORIF) RIGHT RADIAL SHAFT FRACTURE;  Surgeon: Leandrew Koyanagi, MD;  Location: [facility];  Service: Orthopedics;  Laterality: Right;  . TOTAL HIP ARTHROPLASTY Right 09/24/2020   Procedure: RIGHT TOTAL HIP ARTHROPLASTY ANTERIOR APPROACH;  Surgeon: Mcarthur Rossetti, MD;  [facility];  Service: Orthopedics;  Laterality: Right;  . TUBAL LIGATION     Social History   Occupational History  . Not on file  Tobacco Use  . Smoking status: Former Smoker    Packs/day: 0.25    Types: Cigarettes    Quit date: 11/27/2005    Years since quitting: 15.3  . Smokeless tobacco: Never Used  Vaping Use  . Vaping Use: Never used  Substance and Sexual Activity  . Alcohol use: Yes    Alcohol/week: 14.0 standard drinks    Types: 14 Glasses of wine per week    Comment: scotch or wine daily  . Drug use: No  . Sexual activity: Not on file

## 2021-03-23 ENCOUNTER — Ambulatory Visit: Payer: Medicare Other | Admitting: Orthopaedic Surgery

## 2021-03-28 ENCOUNTER — Ambulatory Visit: Payer: Medicare Other | Admitting: Orthopaedic Surgery

## 2021-04-25 IMAGING — CR DG HIP (WITH OR WITHOUT PELVIS) 2-3V*R*
2 series · 2 of 2 positions shown · non-contrast
Comparison: September 24, 2020

CLINICAL DATA: Fell on RIGHT side

EXAM:
DG HIP (WITH OR WITHOUT PELVIS) 2-3V RIGHT

[t pelvis ap]
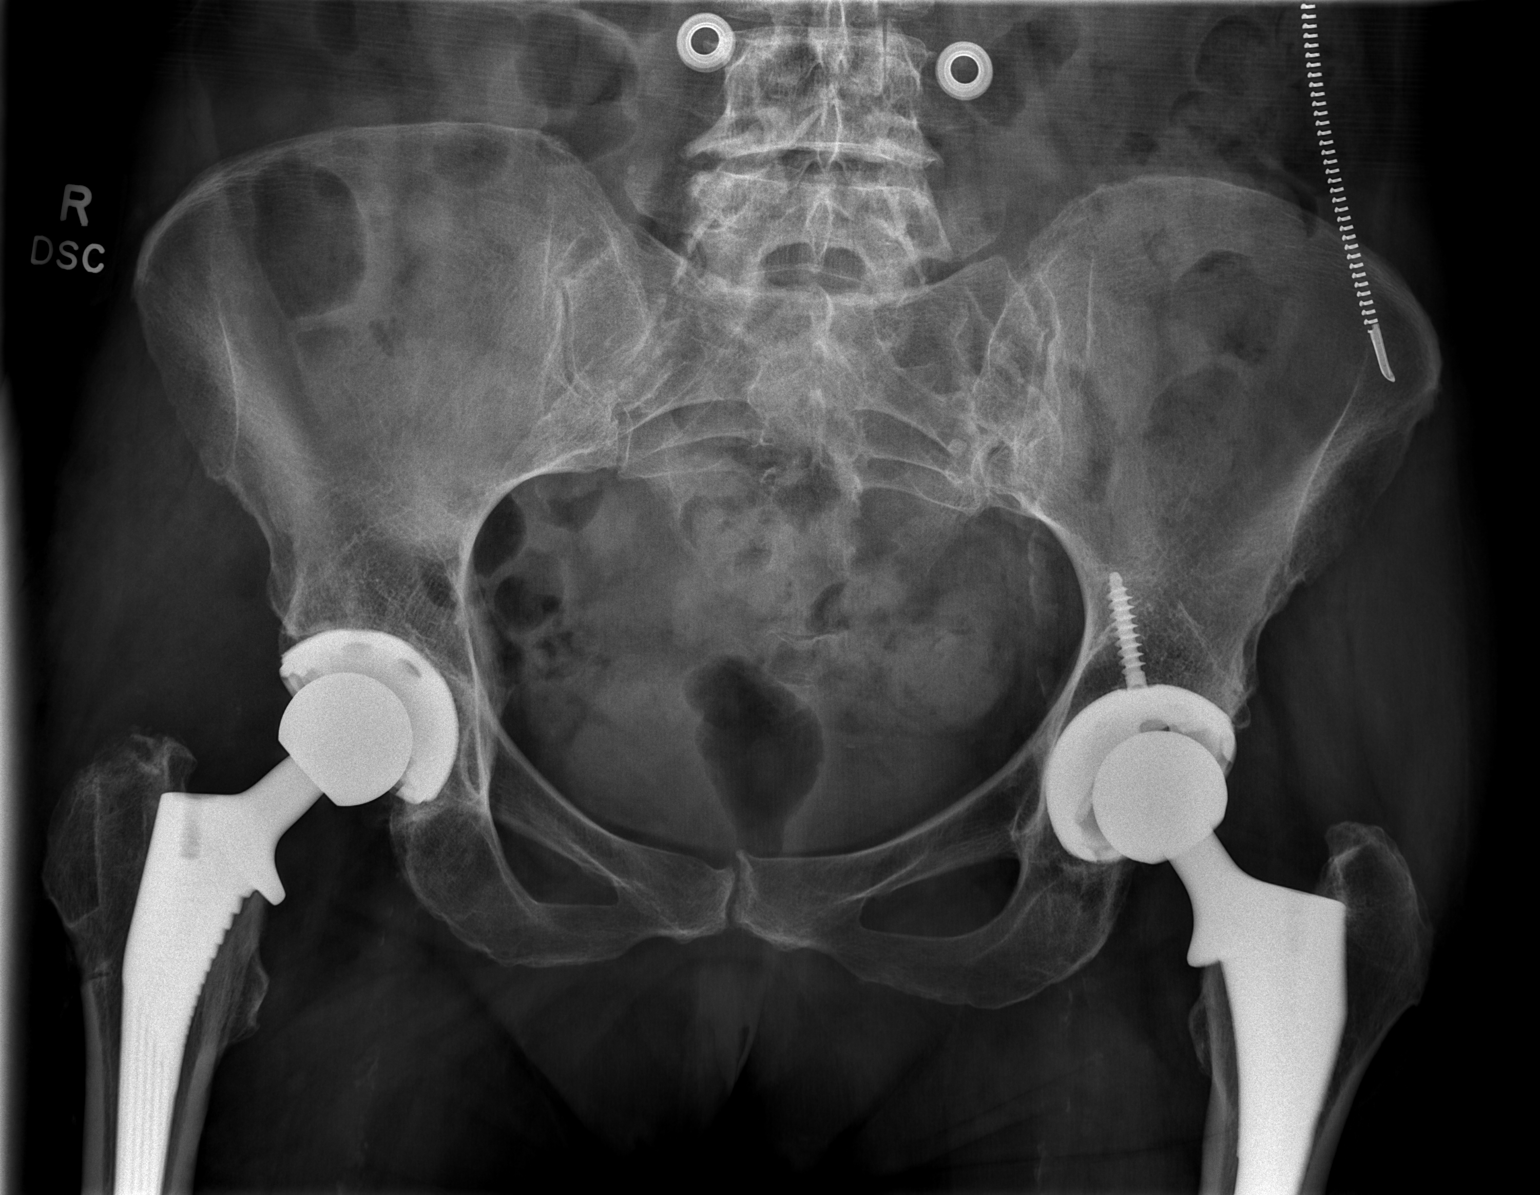

[t hip frog leg right]
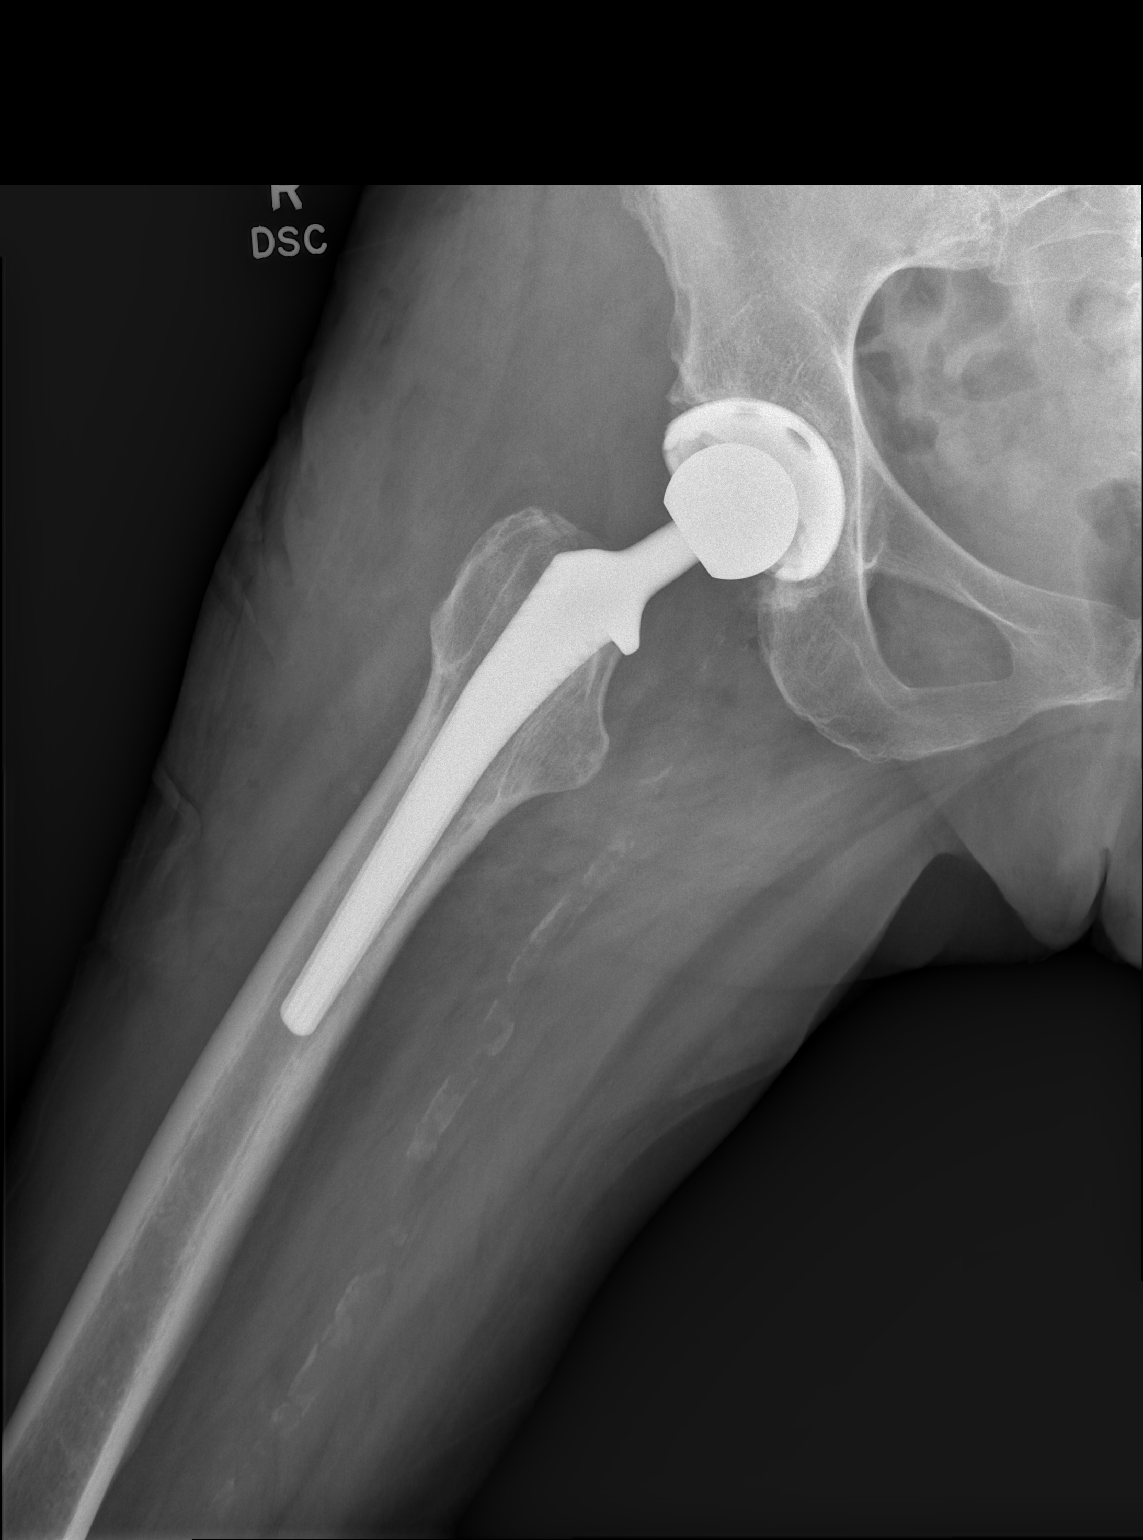

[2 of 2 positions shown; findings below may reference images not displayed]

FINDINGS: Status post bilateral hip arthroplasty. The visualized aspect of the
orthopedic hardware is intact and without periprosthetic fracture or
lucency. Incomplete visualization of the LEFT orthopedic hardware.
Expected alignment of the RIGHT hip arthroplasty. Osteopenia. No
acute fracture or dislocation within the limitations of this exam.
Mild degenerative changes of the lower lumbar spine. Vascular
calcifications.
IMPRESSION: 1.  No acute fracture or dislocation.
2. RIGHT hip arthroplasty without evidence of hardware complication.

## 2021-05-10 ENCOUNTER — Telehealth: Payer: Medicare Other | Admitting: Internal Medicine

## 2021-05-10 NOTE — Telephone Encounter (Signed)
Called patient to let her know, she verbalized understanding.

## 2021-05-10 NOTE — Telephone Encounter (Signed)
Jeanne Smith [phone]  [name] called to schedule her 6 month appointment for August and then she asked if you would consider giving her a prescription back for Ambien, She stated when she was here before she was a mess, she is in good now, son is home and doing well. I told her it would probably require and appointment. She would like to get some before appointment in August if at all possible.

## 2021-06-02 ENCOUNTER — Other Ambulatory Visit: Payer: Self-pay | Admitting: Internal Medicine

## 2021-07-06 DIAGNOSIS — N958 Other specified menopausal and perimenopausal disorders: Secondary | ICD-10-CM | POA: Diagnosis not present

## 2021-07-06 DIAGNOSIS — Z6822 Body mass index (BMI) 22.0-22.9, adult: Secondary | ICD-10-CM | POA: Diagnosis not present

## 2021-07-06 DIAGNOSIS — Z124 Encounter for screening for malignant neoplasm of cervix: Secondary | ICD-10-CM | POA: Diagnosis not present

## 2021-07-07 ENCOUNTER — Other Ambulatory Visit: Payer: Medicare Other | Admitting: Internal Medicine

## 2021-07-07 ENCOUNTER — Other Ambulatory Visit: Payer: Self-pay

## 2021-07-07 DIAGNOSIS — E782 Mixed hyperlipidemia: Secondary | ICD-10-CM | POA: Diagnosis not present

## 2021-07-07 LAB — HEPATIC FUNCTION PANEL
AG Ratio: 1.2 (calc) (ref 1.0–2.5)
ALT: 45 U/L — ABNORMAL HIGH (ref 6–29)
AST: 103 U/L — ABNORMAL HIGH (ref 10–35)
Albumin: 3.7 g/dL (ref 3.6–5.1)
Alkaline phosphatase (APISO): 155 U/L — ABNORMAL HIGH (ref 37–153)
Bilirubin, Direct: 0.5 mg/dL — ABNORMAL HIGH (ref 0.0–0.2)
Globulin: 3.1 g/dL (calc) (ref 1.9–3.7)
Indirect Bilirubin: 1 mg/dL (calc) (ref 0.2–1.2)
Total Bilirubin: 1.5 mg/dL — ABNORMAL HIGH (ref 0.2–1.2)
Total Protein: 6.8 g/dL (ref 6.1–8.1)

## 2021-07-07 LAB — LIPID PANEL
Cholesterol: 250 mg/dL — ABNORMAL HIGH (ref <200)
HDL: 73 mg/dL (ref >=50)
LDL Cholesterol (Calc): 151 mg/dL (calc) — ABNORMAL HIGH
Non-HDL Cholesterol (Calc): 177 mg/dL (calc) — ABNORMAL HIGH (ref <130)
Total CHOL/HDL Ratio: 3.4 (calc) (ref <5.0)
Triglycerides: 137 mg/dL (ref <150)

## 2021-07-12 ENCOUNTER — Ambulatory Visit (INDEPENDENT_AMBULATORY_CARE_PROVIDER_SITE_OTHER): Payer: Medicare Other | Admitting: Internal Medicine

## 2021-07-12 ENCOUNTER — Encounter: Payer: Self-pay | Admitting: Internal Medicine

## 2021-07-12 ENCOUNTER — Other Ambulatory Visit: Payer: Self-pay

## 2021-07-12 VITALS — BP 110/70 | HR 108 | Ht 64.0 in | Wt 136.0 lb

## 2021-07-12 DIAGNOSIS — F439 Reaction to severe stress, unspecified: Secondary | ICD-10-CM

## 2021-07-12 DIAGNOSIS — E042 Nontoxic multinodular goiter: Secondary | ICD-10-CM

## 2021-07-12 DIAGNOSIS — E7800 Pure hypercholesterolemia, unspecified: Secondary | ICD-10-CM

## 2021-07-12 DIAGNOSIS — E782 Mixed hyperlipidemia: Secondary | ICD-10-CM

## 2021-07-12 DIAGNOSIS — M8580 Other specified disorders of bone density and structure, unspecified site: Secondary | ICD-10-CM | POA: Diagnosis not present

## 2021-07-12 DIAGNOSIS — I10 Essential (primary) hypertension: Secondary | ICD-10-CM

## 2021-07-12 DIAGNOSIS — G4709 Other insomnia: Secondary | ICD-10-CM | POA: Diagnosis not present

## 2021-07-12 DIAGNOSIS — Z8781 Personal history of (healed) traumatic fracture: Secondary | ICD-10-CM

## 2021-07-12 DIAGNOSIS — M8008XA Age-related osteoporosis with current pathological fracture, vertebra(e), initial encounter for fracture: Secondary | ICD-10-CM

## 2021-07-12 NOTE — Progress Notes (Signed)
   Subjective:    Patient ID: Jeanne Smith, female    DOB: 1951-02-22, 70 y.o.   MRN: [id number]  HPI 70 year old Female seen today for follow-up on hypertension, anxiety and insomnia.  Considerable issues with pain in right wrist status post wrist fracture with surgical repair January 2022.    Review of Systems     Objective:   Physical Exam Blood pressure excellent at 110/70 pulse 108 pulse oximetry 98% weight 136 pounds BMI 23.34 height 5 feet 4 inches  Skin: Warm and dry.  No cervical adenopathy or thyromegaly.  Chest is clear.  Cardiac exam: Regular rate and rhythm without ectopy.  No lower extremity pitting edema.       Assessment & Plan:  She is doing much better than at last visit.  She will continue with olmesartan and HCTZ for hypertension.  Continue to take Ambien sparingly for insomnia.  Needs bone density study with history of wrist fracture.  She remains on estrogen replacement per GYN.  Cholesterol is high at 250 and LDL cholesterol is high at 151.  Patient does not want to be on statin medication.

## 2021-07-13 ENCOUNTER — Ambulatory Visit: Payer: Medicare Other | Admitting: Orthopaedic Surgery

## 2021-07-14 ENCOUNTER — Other Ambulatory Visit: Payer: Medicare Other | Admitting: Internal Medicine

## 2021-07-26 ENCOUNTER — Ambulatory Visit: Payer: Medicare Other | Admitting: Orthopaedic Surgery

## 2021-08-13 NOTE — Patient Instructions (Addendum)
Please have bone density study with history of wrist fracture.  Likely has osteoporosis.  Continue to take Ambien sparingly.  Continue olmesartan and HCTZ for hypertension.  I am pleased you are doing better.  Follow-up for health maintenance exam and Medicare wellness visit in February.

## 2021-09-07 ENCOUNTER — Other Ambulatory Visit: Payer: Self-pay | Admitting: Internal Medicine

## 2021-10-03 ENCOUNTER — Other Ambulatory Visit: Payer: Self-pay | Admitting: Internal Medicine

## 2021-10-17 ENCOUNTER — Telehealth: Payer: Self-pay | Admitting: Internal Medicine

## 2021-10-17 NOTE — Telephone Encounter (Signed)
scheduled

## 2021-10-17 NOTE — Telephone Encounter (Signed)
Jeanne Smith [phone]  [name] called to say she has cough, runny nose, body aches, fatigue, mucus, has tested 3 times for COVID and it has been negative. Just tested few minutes ago. 2 vaccines 1 booster.

## 2021-10-17 NOTE — Telephone Encounter (Signed)
Jeanne Smith started getting sick about 6 days ago, was exposed to COVID 3 weeks ago., no fever

## 2021-10-18 ENCOUNTER — Encounter: Payer: Self-pay | Admitting: Internal Medicine

## 2021-10-18 ENCOUNTER — Ambulatory Visit (INDEPENDENT_AMBULATORY_CARE_PROVIDER_SITE_OTHER): Payer: Medicare Other | Admitting: Internal Medicine

## 2021-10-18 ENCOUNTER — Ambulatory Visit
Admission: RE | Admit: 2021-10-18 | Discharge: 2021-10-18 | Disposition: A | Discharge status: Home / Self Care | Payer: Medicare Other | Source: Ambulatory Visit | Attending: Internal Medicine | Admitting: Internal Medicine

## 2021-10-18 ENCOUNTER — Other Ambulatory Visit: Payer: Self-pay

## 2021-10-18 VITALS — HR 68 | Temp 98.9°F

## 2021-10-18 DIAGNOSIS — R053 Chronic cough: Secondary | ICD-10-CM | POA: Diagnosis not present

## 2021-10-18 DIAGNOSIS — R0989 Other specified symptoms and signs involving the circulatory and respiratory systems: Secondary | ICD-10-CM

## 2021-10-18 DIAGNOSIS — J22 Unspecified acute lower respiratory infection: Secondary | ICD-10-CM

## 2021-10-18 DIAGNOSIS — R059 Cough, unspecified: Secondary | ICD-10-CM | POA: Diagnosis not present

## 2021-10-18 LAB — POCT INFLUENZA A/B
Influenza A, POC: NEGATIVE
Influenza B, POC: NEGATIVE

## 2021-10-18 MED ORDER — DOXYCYCLINE HYCLATE 100 MG PO TABS: 100.0000 mg | ORAL_TABLET | Freq: Two times a day (BID) | ORAL | 0 refills | Status: DC

## 2021-10-18 MED ORDER — BENZONATATE 100 MG PO CAPS: 100.0000 mg | ORAL_CAPSULE | Freq: Three times a day (TID) | ORAL | 0 refills | Status: DC | PRN

## 2021-10-18 MED ORDER — CEFTRIAXONE SODIUM 1 G IJ SOLR
1.0000 g | Freq: Once | INTRAMUSCULAR | Status: AC
  Administered 2021-10-18: 1 g via INTRAMUSCULAR

## 2021-10-18 NOTE — Progress Notes (Signed)
   Subjective:    Patient ID: Jeanne Smith, female    DOB: Apr 09, 1951, 70 y.o.   MRN: [id number]  HPI 70 year old Female seen with cough, runny nose, myalgias, fatigue, mucus production.  Tested 3 times for COVID, negative.  Has had 2 COVID vaccines and 1 COVID booster.  Has not yet had flu vaccine.  No known Covid exposure.  She has a history of essential hypertension.  History of right hip arthroplasty.  History of right wrist fracture.      Review of Systems see above-Congested cough     Objective:   Physical Exam Temperature 98.9 pulse 68 regular pulse oximetry 97%  She looks fatigued and slightly pale.  TMs are clear.  Pharynx slightly injected.  Neck supple.  Chest: Fine rales heard in right lower lobe.  No wheezing.         Assessment & Plan:  Acute lower respiratory infection-possible pneumonia.  chest x-ray to be obtained today  Rapid flu test was negative  COVID test at home was negative  Respiratory virus panel was also negative  Plan: Patient was sent for chest x-ray to rule out pneumonia.  Lungs were hyperinflated with prominent interstitial markings but no consolidation identified.  No pleural effusion or pneumothorax.  She will be treated with doxycycline 100 mg twice daily for 10 days.  Tessalon Perles 100 mg 3 times a day as needed for cough.  Rest and drink plenty of fluids.  Needs COVID booster and flu vaccine when feeling better

## 2021-10-23 LAB — RESPIRATORY VIRUS PANEL

## 2021-10-23 NOTE — Patient Instructions (Addendum)
We are sorry you are not feeling well.  Chest x-ray is negative for pneumonia.  Rapid flu test was negative.  Respiratory virus panel was also negative.  Please take doxycycline 100 mg twice daily for 10 days.  Take Tessalon Perles 100 mg 3 times a day as needed for cough.  Rest and drink plenty of fluids.  You need to get COVID booster and flu vaccine when you are feeling better.  Health maintenance exam has been scheduled for February

## 2021-10-24 NOTE — Progress Notes (Signed)
scheduled

## 2021-10-25 ENCOUNTER — Ambulatory Visit (INDEPENDENT_AMBULATORY_CARE_PROVIDER_SITE_OTHER): Payer: Medicare Other | Admitting: Internal Medicine

## 2021-10-25 ENCOUNTER — Other Ambulatory Visit: Payer: Self-pay

## 2021-10-25 ENCOUNTER — Encounter: Payer: Self-pay | Admitting: Internal Medicine

## 2021-10-25 VITALS — BP 128/84 | HR 111 | Temp 97.9°F

## 2021-10-25 DIAGNOSIS — R053 Chronic cough: Secondary | ICD-10-CM

## 2021-10-25 DIAGNOSIS — B009 Herpesviral infection, unspecified: Secondary | ICD-10-CM | POA: Diagnosis not present

## 2021-10-25 DIAGNOSIS — R051 Acute cough: Secondary | ICD-10-CM | POA: Diagnosis not present

## 2021-10-25 MED ORDER — CEFTRIAXONE SODIUM 1 G IJ SOLR
1.0000 g | Freq: Once | INTRAMUSCULAR | Status: AC
  Administered 2021-10-25: 1 g via INTRAMUSCULAR

## 2021-10-25 MED ORDER — VALACYCLOVIR HCL 500 MG PO TABS: 500.0000 mg | ORAL_TABLET | Freq: Two times a day (BID) | ORAL | 5 refills | Status: AC

## 2021-10-25 MED ORDER — LEVOFLOXACIN 500 MG PO TABS: 500.0000 mg | ORAL_TABLET | Freq: Every day | ORAL | 0 refills | Status: AC

## 2021-10-25 MED ORDER — HYDROCODONE BIT-HOMATROP MBR 5-1.5 MG/5ML PO SOLN: 5.0000 mL | Freq: Three times a day (TID) | ORAL | 0 refills | Status: DC | PRN

## 2021-10-25 MED ORDER — ALBUTEROL SULFATE HFA 108 (90 BASE) MCG/ACT IN AERS: 2.0000 | INHALATION_SPRAY | Freq: Four times a day (QID) | RESPIRATORY_TRACT | 99 refills | Status: DC | PRN

## 2021-10-25 MED ORDER — PREDNISONE 10 MG PO TABS: ORAL_TABLET | ORAL | 0 refills | Status: DC

## 2021-10-25 NOTE — Progress Notes (Signed)
   Subjective:    Patient ID: Jeanne Smith, female    DOB: 15-May-1951, 70 y.o.   MRN: [id number]  HPI 70 year old Female seen today in person for persistent respiratory infection symptoms.  She was seen on November 22 with cough, runny nose, myalgias, fatigue, congestion.  She had tested 3 times for COVID-19 all of which were negative.  She has had 2 COVID vaccines and 1 COVID booster.  At that time, fine rales were heard in her right lower lobe.  Chest x-ray did not reveal pneumonia.  Lungs were hyperinflated with prominent interstitial markings but no consolidation identified.  Rapid flu test was negative and respiratory virus panel also negative.  Home COVID test negative at the time.  Was treated with doxycycline 100 mg twice daily for 10 days and Tessalon Perles 3 times daily as needed for cough.  She called saying she had not improved with this regiment.  Office visit was advised.    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  She is afebrile.  Pulse is 111 blood pressure 128/84 pulse oximetry 97%  Skin: Warm and dry.  Has lesion mid upper lip consistent with Herpes simplex type I.  TMs are dull, pharynx is red without exudate.  Neck is supple.  Chest exam: no rales     Assessment & Plan:  Protracted lower respiratory infection  Herpes simplex type I  Plan: Levaquin 500 mg daily for 7 days.  Hycodan 1 teaspoon every 8 hours as needed for cough.  Take prednisone 10 mg tablets (#21) and tapering course as directed starting with 6 tablets day 1 and decreasing by 1 tablet daily i.e. 6-5-4-3-2-1 taper.  Take Valtrex 500 mg twice daily for 5 days for herpes simplex type I lesion.  Albuterol inhaler 2 puffs every 6 hours as needed for cough and wheezing.  Rocephin 1 g IM given in office

## 2021-10-25 NOTE — Patient Instructions (Addendum)
Take Levaquin 500 mg daily for 7 days.  Hycodan sparingly 1 teaspoon every 8 hours as needed for cough.  1 g IM Rocephin given in office today.  Take prednisone in tapering course as directed for persistent respiratory congestion with negative chest x-ray.  Albuterol inhaler prescribed.  Valtrex prescribed for fever blister upper lip.

## 2021-12-05 ENCOUNTER — Encounter: Payer: Self-pay | Admitting: Internal Medicine

## 2021-12-05 ENCOUNTER — Telehealth: Payer: Self-pay | Admitting: Internal Medicine

## 2021-12-05 ENCOUNTER — Telehealth (INDEPENDENT_AMBULATORY_CARE_PROVIDER_SITE_OTHER): Payer: Medicare Other | Admitting: Internal Medicine

## 2021-12-05 ENCOUNTER — Other Ambulatory Visit: Payer: Self-pay

## 2021-12-05 DIAGNOSIS — U071 COVID-19: Secondary | ICD-10-CM | POA: Diagnosis not present

## 2021-12-05 DIAGNOSIS — I10 Essential (primary) hypertension: Secondary | ICD-10-CM

## 2021-12-05 DIAGNOSIS — E782 Mixed hyperlipidemia: Secondary | ICD-10-CM | POA: Diagnosis not present

## 2021-12-05 DIAGNOSIS — M8008XA Age-related osteoporosis with current pathological fracture, vertebra(e), initial encounter for fracture: Secondary | ICD-10-CM

## 2021-12-05 MED ORDER — LEVOFLOXACIN 500 MG PO TABS: 500.0000 mg | ORAL_TABLET | Freq: Every day | ORAL | 0 refills | Status: DC

## 2021-12-05 MED ORDER — METHYLPREDNISOLONE 4 MG PO TABS: ORAL_TABLET | ORAL | 0 refills | Status: DC

## 2021-12-05 NOTE — Progress Notes (Signed)
° °  Subjective:    Patient ID: Jeanne Smith, female    DOB: September 05, 1951, 71 y.o.   MRN: [id number]  HPI 71 year old Female seen today via interactive audio and video telecommunications due to the Coronavirus pandemic.  Patient is identified using 2 identifiers as Jeanne Smith, a patient in this practice.  She is agreeable to visit in this format today.  She is at her home and I am at my office.  Patient says she tested positive for COVID-19 on Friday, January 6.  She hesitated to contact our office because it was the weekend and she did not want to bother anyone.  She has had myalgias, discolored nasal drainage and some cough.  Symptoms are not getting better.  She has malaise and fatigue.  She has been resting at home.  I records indicates she has had at least 3 COVID immunizations with the last 1 being in December 2021.  She was ill with a respiratory infection in November 2022.  At that time home COVID test was negative and respiratory virus panel was negative as well as rapid flu test.  A chest x-ray was obtained and she was treated with doxycycline and Tessalon Perles.  Chest x-ray showed emphysematous changes with no pneumonia.  Symptoms were slow to improve and on November 29 she was given Levaquin for 7 days and Hycodan.  She was also given a prednisone taper.  An albuterol inhaler was prescribed and she was given Rocephin 1 g IM in the office.  She subsequently improved and did well until coming down with COVID-19 on January 6.  She has a history of hypertension, bilateral rupture of breast implants, right hip arthroplasty in the Fall 2021, hypothyroidism, hyperlipidemia, fractured right wrist March 2020, left hip arthroplasty June 2019, fractured right femur July 2016, comminuted right humeral fracture July 2016.  History of abdominal hysterectomy.  History of bilateral mammoplasty, transverse fracture distal right radius and January 2022.    Review of Systems denies nausea, vomiting,  diarrhea     Objective:   Physical Exam Seen virtually and looks pale and fatigued.  She is able to give clear concise history.  Not having respiratory distress but sounds congested when she speaks.       Assessment & Plan:  Acute COVID-19 virus infection  Plan: We discussed taking Paxlovid versus antibiotics and steroids.  It has been 3 days since she found out she was COVID-positive.  We have agreed she will try Levaquin 500 mg daily for 10 days and a tapering course of Medrol 4 mg tablets starting with 6 tablets day 1 and decreasing by 1 tablet daily.  She has Hycodan if needed for cough.  She is to rest and drink plenty of fluids and to monitor pulse oximetry.  She is to walk some to prevent atelectasis.  She will quarantine at home for minimum of 5 days.  She has albuterol inhaler if needed 2 sprays 4 times daily.  Time spent reviewing chart, interviewing patient virtually, medical decision making and E scribing medications is 20 minutes.

## 2021-12-05 NOTE — Telephone Encounter (Signed)
Erroneous encounter

## 2021-12-05 NOTE — Telephone Encounter (Signed)
Jeanne Smith [phone]  [name] called to say she has tested positive for COVID, she started feeling bad Friday night with little scratchy throat, she now has body aches, runny nose, nasal congestion with mucus, cough some chest congestion. She said she got this from her boyfriend that had it 2 days before her. No fever, Has had at least 3 COVID vaccines.

## 2021-12-05 NOTE — Patient Instructions (Addendum)
Patient will take Levaquin 500 mg daily for 10 days and a tapering course of Medrol 4 mg starting with 6 tablets day 1 and decreasing by 1 tablet daily i.e. 6-5-4-3-2-1 taper. She will walk some to prevent atelectasis. She will monitor pulse oximetry. She will stay well hydrated and contact me immediately if not doing well. She has Hycodan if needed for cough. She has albuterol inhaler to use 2 sprays 4 times daily if needed.

## 2021-12-05 NOTE — Telephone Encounter (Signed)
scheduled

## 2021-12-09 ENCOUNTER — Telehealth: Payer: Self-pay | Admitting: Internal Medicine

## 2021-12-09 NOTE — Telephone Encounter (Signed)
[place] results to [facility] [phone], phone [phone]  +COVID 12/04/2021

## 2021-12-13 NOTE — Telephone Encounter (Signed)
Jeanne Smith just called back to say she is not any better, she is still coughing has a lot of green mucus, has no taste, no smell and has finished all of her medications. Can we do video visit this afternoon?

## 2021-12-13 NOTE — Telephone Encounter (Signed)
Called patient back to let her know she needs to continue to take Leviquin till it is all gone and calls [place] back early in the day on Thursday or Friday if not feeling better and she will need to come in and be seen. She stated she is taking Musinex because her pharmacist said it would be okay for her to take that. Blood pressure has been good lately.Patient verbalize understanding.

## 2022-01-13 ENCOUNTER — Other Ambulatory Visit: Payer: Medicare Other | Admitting: Internal Medicine

## 2022-01-13 ENCOUNTER — Other Ambulatory Visit: Payer: Self-pay

## 2022-01-13 DIAGNOSIS — R7301 Impaired fasting glucose: Secondary | ICD-10-CM

## 2022-01-13 DIAGNOSIS — R5383 Other fatigue: Secondary | ICD-10-CM | POA: Diagnosis not present

## 2022-01-13 DIAGNOSIS — E7800 Pure hypercholesterolemia, unspecified: Secondary | ICD-10-CM

## 2022-01-13 DIAGNOSIS — I10 Essential (primary) hypertension: Secondary | ICD-10-CM | POA: Diagnosis not present

## 2022-01-13 DIAGNOSIS — R739 Hyperglycemia, unspecified: Secondary | ICD-10-CM | POA: Diagnosis not present

## 2022-01-16 NOTE — Addendum Note (Signed)
Addended by: Angus Seller on: 01/16/2022 12:42 PM   Modules accepted: Orders

## 2022-01-18 LAB — COMPLETE METABOLIC PANEL WITH GFR
AG Ratio: 1.2 (calc) (ref 1.0–2.5)
ALT: 24 U/L (ref 6–29)
AST: 56 U/L — ABNORMAL HIGH (ref 10–35)
Albumin: 3.9 g/dL (ref 3.6–5.1)
Alkaline phosphatase (APISO): 135 U/L (ref 37–153)
BUN/Creatinine Ratio: 15 (calc) (ref 6–22)
BUN: 16 mg/dL (ref 7–25)
CO2: 25 mmol/L (ref 20–32)
Calcium: 10.1 mg/dL (ref 8.6–10.4)
Chloride: 102 mmol/L (ref 98–110)
Creat: 1.07 mg/dL — ABNORMAL HIGH (ref 0.60–1.00)
Globulin: 3.3 g/dL (calc) (ref 1.9–3.7)
Glucose, Bld: 129 mg/dL — ABNORMAL HIGH (ref 65–99)
Potassium: 4.1 mmol/L (ref 3.5–5.3)
Sodium: 139 mmol/L (ref 135–146)
Total Bilirubin: 0.9 mg/dL (ref 0.2–1.2)
Total Protein: 7.2 g/dL (ref 6.1–8.1)
eGFR: 56 mL/min/{1.73_m2} — ABNORMAL LOW (ref >=60)

## 2022-01-18 LAB — CBC WITH DIFFERENTIAL/PLATELET
Absolute Monocytes: 564 cells/uL (ref 200–950)
Basophils Absolute: 91 cells/uL (ref 0–200)
Basophils Relative: 1.6 %
Eosinophils Absolute: 194 cells/uL (ref 15–500)
Eosinophils Relative: 3.4 %
HCT: 39.5 % (ref 35.0–45.0)
Hemoglobin: 14 g/dL (ref 11.7–15.5)
Lymphs Abs: 1995 cells/uL (ref 850–3900)
MCH: 36.4 pg — ABNORMAL HIGH (ref 27.0–33.0)
MCHC: 35.4 g/dL (ref 32.0–36.0)
MCV: 102.6 fL — ABNORMAL HIGH (ref 80.0–100.0)
MPV: 12 fL (ref 7.5–12.5)
Monocytes Relative: 9.9 %
Neutro Abs: 2856 cells/uL (ref 1500–7800)
Neutrophils Relative %: 50.1 %
Platelets: 181 10*3/uL (ref 140–400)
RBC: 3.85 10*6/uL (ref 3.80–5.10)
RDW: 14.4 % (ref 11.0–15.0)
Total Lymphocyte: 35 %
WBC: 5.7 10*3/uL (ref 3.8–10.8)

## 2022-01-18 LAB — LIPID PANEL
Cholesterol: 275 mg/dL — ABNORMAL HIGH (ref <200)
HDL: 82 mg/dL (ref >=50)
LDL Cholesterol (Calc): 160 mg/dL (calc) — ABNORMAL HIGH
Non-HDL Cholesterol (Calc): 193 mg/dL (calc) — ABNORMAL HIGH (ref <130)
Total CHOL/HDL Ratio: 3.4 (calc) (ref <5.0)
Triglycerides: 179 mg/dL — ABNORMAL HIGH (ref <150)

## 2022-01-18 LAB — HEMOGLOBIN A1C
Hgb A1c MFr Bld: 5 % of total Hgb (ref <5.7)
Mean Plasma Glucose: 97 mg/dL
eAG (mmol/L): 5.4 mmol/L

## 2022-01-18 LAB — TSH: TSH: 1.68 mIU/L (ref 0.40–4.50)

## 2022-01-19 ENCOUNTER — Other Ambulatory Visit: Payer: Self-pay

## 2022-01-19 ENCOUNTER — Ambulatory Visit (INDEPENDENT_AMBULATORY_CARE_PROVIDER_SITE_OTHER): Payer: Medicare Other | Admitting: Internal Medicine

## 2022-01-19 ENCOUNTER — Encounter: Payer: Self-pay | Admitting: Internal Medicine

## 2022-01-19 VITALS — BP 112/78 | HR 98 | Temp 97.8°F | Ht 64.0 in | Wt 131.0 lb

## 2022-01-19 DIAGNOSIS — D7589 Other specified diseases of blood and blood-forming organs: Secondary | ICD-10-CM

## 2022-01-19 DIAGNOSIS — E782 Mixed hyperlipidemia: Secondary | ICD-10-CM

## 2022-01-19 DIAGNOSIS — Z96643 Presence of artificial hip joint, bilateral: Secondary | ICD-10-CM | POA: Diagnosis not present

## 2022-01-19 DIAGNOSIS — F988 Other specified behavioral and emotional disorders with onset usually occurring in childhood and adolescence: Secondary | ICD-10-CM | POA: Diagnosis not present

## 2022-01-19 DIAGNOSIS — F411 Generalized anxiety disorder: Secondary | ICD-10-CM | POA: Diagnosis not present

## 2022-01-19 DIAGNOSIS — Z0000 Encounter for general adult medical examination without abnormal findings: Secondary | ICD-10-CM

## 2022-01-19 DIAGNOSIS — Z8781 Personal history of (healed) traumatic fracture: Secondary | ICD-10-CM | POA: Diagnosis not present

## 2022-01-19 DIAGNOSIS — F439 Reaction to severe stress, unspecified: Secondary | ICD-10-CM | POA: Diagnosis not present

## 2022-01-19 DIAGNOSIS — R6889 Other general symptoms and signs: Secondary | ICD-10-CM | POA: Diagnosis not present

## 2022-01-19 DIAGNOSIS — I10 Essential (primary) hypertension: Secondary | ICD-10-CM | POA: Diagnosis not present

## 2022-01-19 DIAGNOSIS — R718 Other abnormality of red blood cells: Secondary | ICD-10-CM

## 2022-01-19 DIAGNOSIS — M8580 Other specified disorders of bone density and structure, unspecified site: Secondary | ICD-10-CM | POA: Diagnosis not present

## 2022-01-19 DIAGNOSIS — R7989 Other specified abnormal findings of blood chemistry: Secondary | ICD-10-CM

## 2022-01-19 LAB — B12 AND FOLATE PANEL
Folate: >24 ng/mL
Vitamin B-12: 384 pg/mL (ref 200–1100)

## 2022-01-19 LAB — POCT URINALYSIS DIPSTICK
Bilirubin, UA: NEGATIVE
Blood, UA: NEGATIVE
Glucose, UA: NEGATIVE
Ketones, UA: NEGATIVE
Leukocytes, UA: NEGATIVE
Nitrite, UA: NEGATIVE
Protein, UA: NEGATIVE
Spec Grav, UA: 1.015 (ref 1.010–1.025)
Urobilinogen, UA: 0.2 E.U./dL
pH, UA: 6 (ref 5.0–8.0)

## 2022-01-19 LAB — BUN: BUN: 17 mg/dL (ref 7–25)

## 2022-01-19 LAB — CREATININE, SERUM: Creat: 0.79 mg/dL (ref 0.60–1.00)

## 2022-01-19 MED ORDER — ROSUVASTATIN CALCIUM 10 MG PO TABS: 10.0000 mg | ORAL_TABLET | Freq: Every day | ORAL | 3 refills | Status: DC

## 2022-01-19 NOTE — Progress Notes (Unsigned)
° °  Subjective:    Patient ID: Jeanne Smith, female    DOB: Mar 31, 1951, 71 y.o.   MRN: [id number]  HPI 71 year old Female recovering from Covid- 87. Still has phlegmy cough    Review of Systems fasting glucose ig high but has normal Hgb AIC     Objective:   Physical Exam  BP 112/78 and stable       Assessment & Plan:

## 2022-01-20 NOTE — Progress Notes (Signed)
° ° ° °  Annual Wellness Visit     Patient: Jeanne Smith, Female    DOB: Jun 13, 1951, 71 y.o.   MRN: [id number] Visit Date: 01/19/2022  Chief Complaint  Patient presents with   Medicare Wellness   Annual Exam   Subjective    Jeanne Smith is a 71 y.o. female who presents today for her Annual Wellness Visit.  HPI   Social History   Social History Narrative   Not on file    Patient Care Team: Baxley, Cresenciano Lick, MD as PCP - General [place], Peter M, MD as PCP - Cardiology (Cardiology)  Review of Systems   Objective    Vitals: BP 112/78    Pulse 98    Temp 97.8 F (36.6 C) (Tympanic)    Ht 5\' 4"  (1.626 m)    Wt 131 lb (59.4 kg)    SpO2 98%    BMI 22.49 kg/m   Physical Exam   Most recent functional status assessment: In your present state of health, do you have any difficulty performing the following activities: 01/19/2022  Hearing? N  Vision? N  Difficulty concentrating or making decisions? N  Walking or climbing stairs? N  Dressing or bathing? N  Doing errands, shopping? N  Preparing Food and eating ? N  Using the Toilet? N  In the past six months, have you accidently leaked urine? Y  Do you have problems with loss of bowel control? N  Managing your Medications? N  Managing your Finances? N  Housekeeping or managing your Housekeeping? N  Some recent data might be hidden   Most recent fall risk assessment: Fall Risk  01/19/2022  Falls in the past year? 0  Comment -  Number falls in past yr: 0  Comment -  Injury with Fall? 0  Risk for fall due to : No Fall Risks  Follow up Falls evaluation completed    Most recent depression screenings: PHQ 2/9 Scores 01/19/2022 01/17/2021  PHQ - 2 Score 0 1   Most recent cognitive screening: 6CIT Screen 01/19/2022  What Year? 0 points  What month? 0 points  What time? 0 points  Count back from 20 2 points  Months in reverse 0 points  Repeat phrase 4 points  Total Score 6       Assessment & Plan     Annual  wellness visit done today including the all of the following: Reviewed patient's Family Medical History Reviewed and updated list of patient's medical providers Assessment of cognitive impairment was done Assessed patient's functional ability Established a written schedule for health screening [place] Completed and Reviewed  Discussed health benefits of physical activity, and encouraged her to engage in regular exercise appropriate for her age and condition.            Angus Seller, CMA

## 2022-01-30 DIAGNOSIS — Z20822 Contact with and (suspected) exposure to covid-19: Secondary | ICD-10-CM | POA: Diagnosis not present

## 2022-02-02 ENCOUNTER — Telehealth: Payer: Self-pay | Admitting: *Deleted

## 2022-02-02 NOTE — Telephone Encounter (Signed)
1 year Ortho bundle call completed. ?

## 2022-03-09 ENCOUNTER — Other Ambulatory Visit: Payer: Self-pay | Admitting: Internal Medicine

## 2022-03-13 ENCOUNTER — Telehealth: Payer: Self-pay

## 2022-03-13 NOTE — Telephone Encounter (Signed)
scheduled

## 2022-03-13 NOTE — Telephone Encounter (Signed)
Patient states that she is having some blood in her stool. She states that she also has an abscess by her rectum. This has been going on x 3 weeks. She would like to be evaluated and sent to GI if needed. I have made her an appointment for tomorrow at 1230. She states that she has a history of lots of ibuprofen use due to hip pain. ?

## 2022-03-14 ENCOUNTER — Encounter: Payer: Self-pay | Admitting: Internal Medicine

## 2022-03-14 ENCOUNTER — Ambulatory Visit (INDEPENDENT_AMBULATORY_CARE_PROVIDER_SITE_OTHER): Payer: Medicare Other | Admitting: Internal Medicine

## 2022-03-14 VITALS — BP 108/64 | HR 90 | Temp 97.3°F | Wt 132.5 lb

## 2022-03-14 DIAGNOSIS — K611 Rectal abscess: Secondary | ICD-10-CM

## 2022-03-14 NOTE — Patient Instructions (Signed)
You are being referred to [facility] regarding left perirectal abscess.  I have been in touch with Dr. Leighton Ruff and her PA will see you this week.  ? ?Also referral being placed to Dr. Laurier Nancy for colonoscopy.  Please have perirectal abscess taken care of and healed before you proceed with colonoscopy. ?

## 2022-03-14 NOTE — Progress Notes (Signed)
? ?  Subjective:  ? ? Patient ID: Jeanne Smith, female    DOB: 01/15/1951, 71 y.o.   MRN: [id number] ? ?HPI 72 year old Female seen for 2 problems: ?She says that she has noticed recently ?dark brown stool containing mucous. No BRBPR. She is worried about colon cancer. Has never had a colonoscopy. She would like to see Dr. Tarri Glenn at [place] and will be referred. Was given hemoccult card to do at home and return here to be developed. ? ?She has has a left perirectal abscess. It is not draining. It is tender.  Denies fever or chills.  Has malaise and fatigue. ? ?She is being referred to [facility], Dr. Leighton Ruff. ? ?Patient had acute COVID-19 in January 2023. ? ?She had an acute lower respiratory infection that was protracted in November 2022. ? ?She has a history of hypertension, anxiety and insomnia. ? ?She had left wrist fracture with surgical repair January 2022.  A bone density study has been ordered but she has never gotten this done. ? ?In January 2022, she had hardware removal from her right hip and converted to a total hip arthroplasty. ? ?Intertrochanteric fracture right femur July 2016 ? ?Also had comminuted right humeral fracture July 2016 ? ?History of hip arthroplasty June 2019 ? ?History of osteoarthritis, hypothyroidism and hyperlipidemia. ? ?History of attention deficit disorder ? ?History of right thyroid nodule evaluated in 1994.  It was aspirated in 1995 and was found to be benign.  In May 1998 a 3.8 cm nodule was noted in right lower lobe of thyroid compatible with a functioning adenoma.  She is seeing Dr. Forde Dandy in the past. ? ?History of uterine fibroids and ovarian cyst.  History of cervical dysplasia. ? ?Breast augmentation 1998 by Dr. Dessie Coma.  Removal of endocervical polyp by Dr. Nori Riis February 1999.  Right lateral breast lymph node excision by Dr. Mendel Ryder in 1997 which was benign.  Tonsillectomy as a child.  D&C after miscarriage in 1995. ? ?Smoked occasionally in  college.  Drinks wine nightly.  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 ? ?No known drug allergies ? ?Hysterectomy 2006 ? ?Neck injury 2005 ? ?Family history: Father died at age 67 with hypertension, prostate and lung cancer.  Mother living fairly healthy but elderly.  1 brother died at age 71 in 57 with history of alcoholism and experienced a spontaneous cardiac arrest.  Adult son who has had treatment for alcohol addiction.  2 adult daughters. ? ? ? ?Review of Systems  ?See above ?   ?Objective:  ? Physical Exam ?Vital signs reviewed.  She is afebrile.  Blood pressure 108/64 ?There is a left perirectal abscess.  Aspect left para -buttock area.  This is not draining.  It is tender to the touch. ? ?Rectum was not examined.  She was given a Hemoccult card to obtain stool and return here for development to see if still contains blood. ? ?CBC with differential drawn today ? ? ?   ?Assessment & Plan:  ?Left perirectal abscess-to be seen at [facility] surgery very soon.  CBC with differential drawn and pending.  Sits baths recommended ? ?Changes in stool including more mucus type stool but no frank blood.  Hemoccult card given.  Referral for colonoscopy.  However colonoscopy cannot be done until perirectal abscess has been evaluated and treated. ? ? ? ?

## 2022-03-15 LAB — CBC WITH DIFFERENTIAL/PLATELET
Absolute Monocytes: 654 cells/uL (ref 200–950)
Basophils Absolute: 103 cells/uL (ref 0–200)
Basophils Relative: 1.2 %
Eosinophils Absolute: 138 cells/uL (ref 15–500)
Eosinophils Relative: 1.6 %
HCT: 40.9 % (ref 35.0–45.0)
Hemoglobin: 14.3 g/dL (ref 11.7–15.5)
Lymphs Abs: 1694 cells/uL (ref 850–3900)
MCH: 36.5 pg — ABNORMAL HIGH (ref 27.0–33.0)
MCHC: 35 g/dL (ref 32.0–36.0)
MCV: 104.3 fL — ABNORMAL HIGH (ref 80.0–100.0)
MPV: 11.4 fL (ref 7.5–12.5)
Monocytes Relative: 7.6 %
Neutro Abs: 6011 cells/uL (ref 1500–7800)
Neutrophils Relative %: 69.9 %
Platelets: 200 10*3/uL (ref 140–400)
RBC: 3.92 10*6/uL (ref 3.80–5.10)
RDW: 11.4 % (ref 11.0–15.0)
Total Lymphocyte: 19.7 %
WBC: 8.6 10*3/uL (ref 3.8–10.8)

## 2022-03-16 DIAGNOSIS — K610 Anal abscess: Secondary | ICD-10-CM | POA: Diagnosis not present

## 2022-03-17 ENCOUNTER — Telehealth: Payer: Self-pay

## 2022-03-17 ENCOUNTER — Other Ambulatory Visit: Payer: Medicare Other

## 2022-03-17 DIAGNOSIS — R718 Other abnormality of red blood cells: Secondary | ICD-10-CM

## 2022-03-17 NOTE — Telephone Encounter (Signed)
Patient will call to get a lab appt for B12 and Folate next week. She is unable to come due to dealing with perirectal abcess.  ?

## 2022-03-24 ENCOUNTER — Ambulatory Visit (INDEPENDENT_AMBULATORY_CARE_PROVIDER_SITE_OTHER): Payer: Medicare Other | Admitting: Gastroenterology

## 2022-03-24 ENCOUNTER — Encounter: Payer: Self-pay | Admitting: Gastroenterology

## 2022-03-24 VITALS — BP 120/70 | HR 86 | Ht 64.0 in | Wt 132.0 lb

## 2022-03-24 DIAGNOSIS — R198 Other specified symptoms and signs involving the digestive system and abdomen: Secondary | ICD-10-CM | POA: Diagnosis not present

## 2022-03-24 DIAGNOSIS — K921 Melena: Secondary | ICD-10-CM | POA: Diagnosis not present

## 2022-03-24 MED ORDER — NA SULFATE-K SULFATE-MG SULF 17.5-3.13-1.6 GM/177ML PO SOLN: 1.0000 | Freq: Once | ORAL | 0 refills | Status: AC

## 2022-03-24 NOTE — Patient Instructions (Addendum)
It was my pleasure to provide care to you today. Based on our discussion, I am providing you with my recommendations below: ? ?RECOMMENDATION(S):  ? ?Add a daily dose of metamucil. ? ?COLONOSCOPY:  ? ?You have been scheduled for a colonoscopy. Please follow written instructions given to you at your visit today.  ? ?PREP:  ? ?Please pick up your prep supplies at the pharmacy within the next 1-3 days. ? ?INHALERS:  ? ?If you use inhalers (even only as needed), please bring them with you on the day of your procedure. ? ?COLONOSCOPY TIPS: ? ?To reduce nausea and dehydration, stay well hydrated for 3-4 days prior to the exam.  ?To prevent skin/hemorrhoid irritation - prior to wiping, put A&Dointment or vaseline on the toilet paper. ?Keep a towel or pad on the bed.  ?BEFORE STARTING YOUR PREP, drink  64oz of clear liquids in the morning. This will help to flush the colon and will ensure you are well hydrated!!!!  ?NOTE - This is in addition to the fluids required for to complete your prep. ?Use of a flavored hard candy, such as grape Anise Salvo, can counteract some of the flavor of the prep and may prevent some nausea.  ? ? ?FOLLOW UP: ? ?After your procedure, you will receive a call from my office staff regarding my recommendation for follow up. ? ?BMI: ? ?If you are age 54 or older, your body mass index should be between 23-30. Your Body mass index is 22.66 kg/m?Marland Kitchen If this is out of the aforementioned range listed, please consider follow up with your Primary Care Provider. ? ?If you are age 22 or younger, your body mass index should be between 19-25. Your Body mass index is 22.66 kg/m?Marland Kitchen If this is out of the aformentioned range listed, please consider follow up with your Primary Care Provider.  ? ?MY CHART: ? ?The [facility] providers would like to encourage you to use [facility] to communicate with providers for non-urgent requests or questions.  Due to long hold times on the telephone, sending your provider a message  by [facility] may be a faster and more efficient way to get a response.  Please allow 48 business hours for a response.  Please remember that this is for non-urgent requests.  ? ?Thank you for trusting me with your gastrointestinal care!   ? ?[place]ton [place], MD, MPH ? ?

## 2022-03-24 NOTE — Progress Notes (Addendum)
? ?Referring Provider: Elby Showers, MD ?Primary Care Physician:  Elby Showers, MD ? ? ?Reason for Consultation:  Blood in the stool ? ? ?IMPRESSION:  ?Change in bowel movements -now with blood and mucus in the stool  ?Perianal abscess ?No prior colonoscopy ?Mother with colon polyps ?Cholelithiasis on ultrasound ?Fatty liver and abnormal liver enzymes ? ?Although mucous in the stools is normal for some people, other possibilities must be considered including IBD particularly given her recent perianal abscess, segmental colitis associated with diverticulosis, mucous secreting appendiceal tumor, and food allergy. Given this differential, I have recommended a colonoscopy.    ? ?Bleeding may be due to outlet sources, but must consider a broader differential including IBD.  ? ?She has a history of abnormal liver enzymes.  Ultrasound showed fatty liver.  Her ongoing alcohol use puts her at risk for both alcoholic steatohepatitis.  Would recommend proceeding with serologic evaluation to exclude concurrent causes of liver disease. ? ? ? ? ?PLAN: ?Add a daily dose of metamucil  ?Colonoscopy in June after cleared by Dr. Marcello Moores ? ?HPI: Jeanne Smith is a 71 y.o. female referred by Dr. Renold Genta.  The history is obtained through the patient and review of her electronic health record. She history of anxiety, hypertension, bilateral hip replacements, prior hysterectomy, and a pilonidal abscess 50 years ago and has recently been under evaluation for perianal abscess treated with I&D by Dr. Marcello Moores. ? ?She has been seeing blood on the toilet paper when wiping over the last month.  Also having some mucous in the stool.  She has chronic constipation that she treats with Dulcolax and magnesium. Stools hae been particularly hard this month.  No history of diarrhea.  No abdominal pain.  No rectal pain. ? ?No prior colonoscopy.  ?Cologuard negative in the past (per patient report) ? ?03/14/22 show hgb 14.3, MCV 104, RDW 11.4, platelets  200 ? ?History of elevated liver enzymes in 2022.  She thinks it may be related to the alcohol that she drinks daily. Will have 1-2 drinks nightly - either scotch or wine.  Abdominal ultrasound 05/27/2020 showed fatty liver and cholelithiasis. ? ?Husband died of pancreatic cancer. Mother with colon polyps - she is now 21 years ago.  ?There is no other known family history of colon cancer or polyps. No family history of stomach cancer or other GI malignancy. No family history of inflammatory bowel disease or celiac.  ? ? ?Past Medical History:  ?Diagnosis Date  ? Anxiety   ? Arthritis   ? oa  ? Attention deficit disorder   ? Dysrhythmia 2019  ? episode of palpitations x 25 January 2018, stress test normal  ? H/O seasonal allergies   ? Hyperlipidemia   ? Hypertension   ? Hypothyroidism   ? thyroid nodules, off synthroid > 15 years  ? Insomnia   ? Osteoarthritis   ? Osteopenia   ? Thyroid nodule   ? ? ?Past Surgical History:  ?Procedure Laterality Date  ? ABDOMINAL HYSTERECTOMY    ? AUGMENTATION MAMMAPLASTY Bilateral   ? silicone over 30 years  ? CONVERSION TO TOTAL HIP Left 05/16/2018  ? Procedure: LEFT HIP ANTERIOR APPROACH;  Surgeon: Paralee Cancel, MD;  Location: [facility];  Service: Orthopedics;  Laterality: Left;  ? FEMUR IM NAIL Right 06/16/2015  ? Procedure: INTRAMEDULLARY (IM) NAIL FEMORAL;  Surgeon: Renette Butters, MD;  Location: [place];  Service: Orthopedics;  Laterality: Right;  ? HARDWARE REMOVAL Right 09/24/2020  ? Procedure:  REMOVAL OF IM ROD/HIP SCREW RIGHT HIP;  Surgeon: Mcarthur Rossetti, MD;  Location: [facility];  Service: Orthopedics;  Laterality: Right;  ? HARDWARE REMOVAL Right 12/24/2020  ? Procedure: HARDWARE REMOVAL RIGHT WRIST;  Surgeon: Leandrew Koyanagi, MD;  Location: [place];  Service: Orthopedics;  Laterality: Right;  ? HIP SURGERY    ? JOINT REPLACEMENT    ? ORIF RADIAL FRACTURE Right 12/24/2020  ? Procedure: OPEN REDUCTION INTERNAL FIXATION (ORIF) RIGHT RADIAL SHAFT FRACTURE;   Surgeon: Leandrew Koyanagi, MD;  Location: [place];  Service: Orthopedics;  Laterality: Right;  ? TOTAL HIP ARTHROPLASTY Right 09/24/2020  ? Procedure: RIGHT TOTAL HIP ARTHROPLASTY ANTERIOR APPROACH;  Surgeon: Mcarthur Rossetti, MD;  Location: [facility];  Service: Orthopedics;  Laterality: Right;  ? TUBAL LIGATION    ? ? ? ?Current Outpatient Medications  ?Medication Sig Dispense Refill  ? albuterol (VENTOLIN HFA) 108 (90 Base) MCG/ACT inhaler Inhale 2 puffs into the lungs every 6 (six) hours as needed for wheezing or shortness of breath. 8 g PRN  ? ALPRAZolam (XANAX) 0.25 MG tablet TAKE 1 TABLET(0.25 MG) BY MOUTH TWICE DAILY AS NEEDED FOR ANXIETY 60 tablet 0  ? aspirin 81 MG chewable tablet Chew 1 tablet (81 mg total) by mouth 2 (two) times daily. (Patient taking differently: Chew 81 mg by mouth daily.) 30 tablet 0  ? b complex vitamins tablet Take 1 tablet by mouth daily.    ? Calcium Citrate-Vitamin D (CALCIUM CITRATE + D PO) Take 2 tablets by mouth at bedtime. Mag    ? estradiol (ESTRACE) 2 MG tablet Take 2 mg by mouth at bedtime.     ? hydrochlorothiazide (HYDRODIURIL) 25 MG tablet TAKE 1 TABLET(25 MG) BY MOUTH DAILY 90 tablet 3  ? Hypromellose, PF, (RETAINE HPMC) 0.3 % SOLN Place 1 drop into both eyes daily as needed (Dry eyes).     ? Magnesium 400 MG TABS Take 400 mg by mouth at bedtime.    ? Multiple Vitamins-Minerals (MULTIVITAMIN WITH MINERALS) tablet Take 1 tablet by mouth daily.    ? Na Sulfate-K Sulfate-Mg Sulf 17.5-3.13-1.6 GM/177ML SOLN Take 1 kit by mouth once for 1 dose. 354 mL 0  ? olmesartan (BENICAR) 40 MG tablet TAKE 1 TABLET(40 MG) BY MOUTH DAILY 90 tablet 0  ? Omega-3 Fatty Acids (EQL OMEGA 3 FISH OIL PO) Take 565 mg by mouth daily. Complete    ? polyethylene glycol (MIRALAX / GLYCOLAX) packet Take 17 g by mouth 2 (two) times daily. (Patient taking differently: Take 17 g by mouth daily as needed for mild constipation.) 14 each 0  ? rosuvastatin (CRESTOR) 10 MG tablet Take 1  tablet (10 mg total) by mouth daily. 90 tablet 3  ? Saline 0.9 % AERS Place 1 spray into both nostrils 2 (two) times daily as needed (congestion).     ? zolpidem (AMBIEN) 10 MG tablet TAKE 1 TABLET(10 MG) BY MOUTH AT BEDTIME AS NEEDED FOR SLEEP 30 tablet 0  ? ?No current facility-administered medications for this visit.  ? ? ?Allergies as of 03/24/2022  ? (No Known Allergies)  ? ? ?Family History  ?Problem Relation Age of Onset  ? Heart failure Mother   ? Hypertension Father   ? Cancer Father   ?     lung  ? Stroke Father   ? Heart disease Brother   ? Alcoholism Brother   ? Colon cancer Neg Hx   ? Pancreatic cancer Neg Hx   ?  Esophageal cancer Neg Hx   ? Liver cancer Neg Hx   ? Stomach cancer Neg Hx   ? ? ?Social History  ? ?Socioeconomic History  ? Marital status: Widowed  ?  Spouse name: Not on file  ? Number of children: 3  ? Years of education: Not on file  ? Highest education level: Not on file  ?Occupational History  ? Not on file  ?Tobacco Use  ? Smoking status: Former  ?  Packs/day: 0.25  ?  Types: Cigarettes  ?  Quit date: 11/27/2005  ?  Years since quitting: 16.3  ? Smokeless tobacco: Never  ?Vaping Use  ? Vaping Use: Never used  ?Substance and Sexual Activity  ? Alcohol use: Yes  ?  Alcohol/week: 14.0 standard drinks  ?  Types: 14 Glasses of wine per week  ?  Comment: scotch or wine daily  ? Drug use: No  ? Sexual activity: Not on file  ?Other Topics Concern  ? Not on file  ?Social History Narrative  ? Not on file  ? ?Social Determinants of Health  ? ?Financial Resource Strain: Not on file  ?Food Insecurity: Not on file  ?Transportation Needs: Not on file  ?Physical Activity: Not on file  ?Stress: Not on file  ?Social Connections: Not on file  ?Intimate Partner Violence: Not on file  ? ? ?Review of Systems: ?12 system ROS is negative except as noted above with the addition of allergies, anxiety, and insomnia.  ? ?Physical Exam: ?General:   Alert,  well-nourished, pleasant and cooperative in NAD ?Head:   Normocephalic and atraumatic. ?Eyes:  Sclera clear, no icterus.   Conjunctiva pink. ?Ears:  Normal auditory acuity. ?Nose:  No deformity, discharge,  or lesions. ?Mouth:  No deformity or lesions.   ?Neck:  Sup

## 2022-03-26 DIAGNOSIS — Z20822 Contact with and (suspected) exposure to covid-19: Secondary | ICD-10-CM | POA: Diagnosis not present

## 2022-04-04 DIAGNOSIS — Z20822 Contact with and (suspected) exposure to covid-19: Secondary | ICD-10-CM | POA: Diagnosis not present

## 2022-04-12 DIAGNOSIS — K610 Anal abscess: Secondary | ICD-10-CM | POA: Diagnosis not present

## 2022-05-01 ENCOUNTER — Telehealth: Payer: Self-pay | Admitting: Gastroenterology

## 2022-05-01 NOTE — Telephone Encounter (Signed)
Patient called states she just had a procedure and was given pain medication and is also scheduled for 05/03/22 to have a colonoscopy done. She would like to discuss pain medication further with a nurse.

## 2022-05-01 NOTE — Telephone Encounter (Signed)
The pt states that she has had to take some ibuprofen, gabapentin and meloxicam for hip/back pain.  She wanted to be sure she can proceed with colon procedure on 6/7.  She has been advised that she can proceed as planned.. She is also following up with her PCP in regards to hip/back pain.

## 2022-05-03 ENCOUNTER — Encounter: Payer: Self-pay | Admitting: Gastroenterology

## 2022-05-03 ENCOUNTER — Encounter (HOSPITAL_COMMUNITY): Payer: Self-pay | Admitting: Emergency Medicine

## 2022-05-03 ENCOUNTER — Emergency Department (HOSPITAL_COMMUNITY): Payer: Medicare Other

## 2022-05-03 ENCOUNTER — Telehealth: Payer: Self-pay

## 2022-05-03 ENCOUNTER — Ambulatory Visit (AMBULATORY_SURGERY_CENTER): Payer: Medicare Other | Admitting: Gastroenterology

## 2022-05-03 ENCOUNTER — Emergency Department (HOSPITAL_COMMUNITY)
Admission: EM | Admit: 2022-05-03 | Discharge: 2022-05-03 | Disposition: A | Discharge status: Home / Self Care | Payer: Medicare Other | Attending: Student | Admitting: Student

## 2022-05-03 ENCOUNTER — Other Ambulatory Visit: Payer: Self-pay

## 2022-05-03 VITALS — BP 93/72 | HR 122 | Temp 97.1°F | Resp 25 | Ht 64.0 in | Wt 132.0 lb

## 2022-05-03 DIAGNOSIS — Z96641 Presence of right artificial hip joint: Secondary | ICD-10-CM | POA: Diagnosis not present

## 2022-05-03 DIAGNOSIS — E039 Hypothyroidism, unspecified: Secondary | ICD-10-CM | POA: Diagnosis not present

## 2022-05-03 DIAGNOSIS — R000 Tachycardia, unspecified: Secondary | ICD-10-CM | POA: Insufficient documentation

## 2022-05-03 DIAGNOSIS — R0789 Other chest pain: Secondary | ICD-10-CM | POA: Diagnosis not present

## 2022-05-03 DIAGNOSIS — K635 Polyp of colon: Secondary | ICD-10-CM | POA: Diagnosis not present

## 2022-05-03 DIAGNOSIS — D124 Benign neoplasm of descending colon: Secondary | ICD-10-CM

## 2022-05-03 DIAGNOSIS — I10 Essential (primary) hypertension: Secondary | ICD-10-CM | POA: Diagnosis not present

## 2022-05-03 DIAGNOSIS — R079 Chest pain, unspecified: Secondary | ICD-10-CM | POA: Diagnosis not present

## 2022-05-03 DIAGNOSIS — Z7982 Long term (current) use of aspirin: Secondary | ICD-10-CM | POA: Insufficient documentation

## 2022-05-03 DIAGNOSIS — D123 Benign neoplasm of transverse colon: Secondary | ICD-10-CM

## 2022-05-03 DIAGNOSIS — K921 Melena: Secondary | ICD-10-CM | POA: Diagnosis not present

## 2022-05-03 DIAGNOSIS — Z87891 Personal history of nicotine dependence: Secondary | ICD-10-CM | POA: Diagnosis not present

## 2022-05-03 DIAGNOSIS — I4891 Unspecified atrial fibrillation: Secondary | ICD-10-CM | POA: Diagnosis not present

## 2022-05-03 DIAGNOSIS — C20 Malignant neoplasm of rectum: Secondary | ICD-10-CM | POA: Diagnosis not present

## 2022-05-03 DIAGNOSIS — K625 Hemorrhage of anus and rectum: Secondary | ICD-10-CM | POA: Diagnosis not present

## 2022-05-03 DIAGNOSIS — K6389 Other specified diseases of intestine: Secondary | ICD-10-CM

## 2022-05-03 DIAGNOSIS — D374 Neoplasm of uncertain behavior of colon: Secondary | ICD-10-CM

## 2022-05-03 DIAGNOSIS — D490 Neoplasm of unspecified behavior of digestive system: Secondary | ICD-10-CM

## 2022-05-03 LAB — COMPREHENSIVE METABOLIC PANEL
ALT: 33 U/L (ref 0–44)
AST: 56 U/L — ABNORMAL HIGH (ref 15–41)
Albumin: 3.3 g/dL — ABNORMAL LOW (ref 3.5–5.0)
Alkaline Phosphatase: 125 U/L (ref 38–126)
Anion gap: 13 (ref 5–15)
BUN: 18 mg/dL (ref 8–23)
CO2: 19 mmol/L — ABNORMAL LOW (ref 22–32)
Calcium: 9.2 mg/dL (ref 8.9–10.3)
Chloride: 105 mmol/L (ref 98–111)
Creatinine, Ser: 1.21 mg/dL — ABNORMAL HIGH (ref 0.44–1.00)
GFR, Estimated: 48 mL/min — ABNORMAL LOW (ref >60)
Glucose, Bld: 106 mg/dL — ABNORMAL HIGH (ref 70–99)
Potassium: 3.1 mmol/L — ABNORMAL LOW (ref 3.5–5.1)
Sodium: 137 mmol/L (ref 135–145)
Total Bilirubin: 0.9 mg/dL (ref 0.3–1.2)
Total Protein: 6.9 g/dL (ref 6.5–8.1)

## 2022-05-03 LAB — CBC WITH DIFFERENTIAL/PLATELET
Abs Immature Granulocytes: 0.03 10*3/uL (ref 0.00–0.07)
Basophils Absolute: 0.1 10*3/uL (ref 0.0–0.1)
Basophils Relative: 1 %
Eosinophils Absolute: 0.2 10*3/uL (ref 0.0–0.5)
Eosinophils Relative: 2 %
HCT: 38.6 % (ref 36.0–46.0)
Hemoglobin: 13.1 g/dL (ref 12.0–15.0)
Immature Granulocytes: 0 %
Lymphocytes Relative: 19 %
Lymphs Abs: 1.7 10*3/uL (ref 0.7–4.0)
MCH: 34.7 pg — ABNORMAL HIGH (ref 26.0–34.0)
MCHC: 33.9 g/dL (ref 30.0–36.0)
MCV: 102.4 fL — ABNORMAL HIGH (ref 80.0–100.0)
Monocytes Absolute: 0.5 10*3/uL (ref 0.1–1.0)
Monocytes Relative: 6 %
Neutro Abs: 6.2 10*3/uL (ref 1.7–7.7)
Neutrophils Relative %: 72 %
Platelets: 175 10*3/uL (ref 150–400)
RBC: 3.77 MIL/uL — ABNORMAL LOW (ref 3.87–5.11)
RDW: 12.3 % (ref 11.5–15.5)
WBC: 8.8 10*3/uL (ref 4.0–10.5)
nRBC: 0 % (ref 0.0–0.2)

## 2022-05-03 LAB — TROPONIN I (HIGH SENSITIVITY): Troponin I (High Sensitivity): 10 ng/L (ref <18)

## 2022-05-03 MED ORDER — SODIUM CHLORIDE 0.9 % IV SOLN: 500.0000 mL | Freq: Once | INTRAVENOUS | Status: DC

## 2022-05-03 MED ORDER — MAGNESIUM OXIDE -MG SUPPLEMENT 400 (240 MG) MG PO TABS
800.0000 mg | ORAL_TABLET | Freq: Once | ORAL | Status: AC
Start: 2022-05-03 — End: 2022-05-03
  Administered 2022-05-03: 800 mg via ORAL
  Filled 2022-05-03: qty 2

## 2022-05-03 MED ORDER — POTASSIUM CHLORIDE CRYS ER 20 MEQ PO TBCR
40.0000 meq | EXTENDED_RELEASE_TABLET | Freq: Once | ORAL | Status: AC
  Administered 2022-05-03: 40 meq via ORAL
  Filled 2022-05-03: qty 2

## 2022-05-03 NOTE — Progress Notes (Signed)
Referring Provider: Elby Showers, MD Primary Care Physician:  Elby Showers, MD   Indication for colonoscopy:  Blood in the stool   IMPRESSION:  Change in bowel movements -now with blood and mucus in the stool  No prior colonoscopy Mother with colon polyps She is an appropriate candidate for monitored anesthesia care in the [facility]    PLAN: Colonoscopy  HPI: Jeanne Smith is a 71 y.o. female who presents for colonoscopy. She history of anxiety, hypertension, bilateral hip replacements, prior hysterectomy, and a pilonidal abscess 50 years ago and has recently been under evaluation for perianal abscess treated with I&D by Dr. Marcello Moores.  She has been seeing blood on the toilet paper when wiping over the last month.  Also having some mucous in the stool.  She has chronic constipation that she treats with Dulcolax and magnesium. Stools hae been particularly hard this month.  No history of diarrhea.  No abdominal pain.  No rectal pain.  No prior colonoscopy.  Cologuard negative in the past (per patient report)  03/14/22 show hgb 14.3, MCV 104, RDW 11.4, platelets 200  History of elevated liver enzymes in 2022.  She thinks it may be related to the alcohol that she drinks daily. Will have 1-2 drinks nightly - either scotch or wine.  Abdominal ultrasound 05/27/2020 showed fatty liver and cholelithiasis.  Husband died of pancreatic cancer. Mother with colon polyps - she is now 64 years ago.  There is no other known family history of colon cancer or polyps. No family history of stomach cancer or other GI malignancy. No family history of inflammatory bowel disease or celiac.    Past Medical History:  Diagnosis Date   Anxiety    Arthritis    oa   Attention deficit disorder    Dysrhythmia 2019   episode of palpitations x 25 January 2018, stress test normal   H/O seasonal allergies    Hyperlipidemia    Hypertension    Hypothyroidism    thyroid nodules, off synthroid > 15 years   Insomnia     Osteoarthritis    Osteopenia    Thyroid nodule     Past Surgical History:  Procedure Laterality Date   ABDOMINAL HYSTERECTOMY     AUGMENTATION MAMMAPLASTY Bilateral    silicone over 30 years   CONVERSION TO TOTAL HIP Left 05/16/2018   Procedure: LEFT HIP ANTERIOR APPROACH;  Surgeon: Paralee Cancel, MD;  [facility];  Service: Orthopedics;  Laterality: Left;   FEMUR IM NAIL Right 06/16/2015   Procedure: INTRAMEDULLARY (IM) NAIL FEMORAL;  Surgeon: Renette Butters, MD;  Location: [place];  Service: Orthopedics;  Laterality: Right;   HARDWARE REMOVAL Right 09/24/2020   Procedure: REMOVAL OF IM ROD/HIP SCREW RIGHT HIP;  Surgeon: Mcarthur Rossetti, MD;  Location[facility];  Service: Orthopedics;  Laterality: Right;   HARDWARE REMOVAL Right 12/24/2020   Procedure: HARDWARE REMOVAL RIGHT WRIST;  Surgeon: Leandrew Koyanagi, MD;  Location: [place];  Service: Orthopedics;  Laterality: Right;   HIP SURGERY     JOINT REPLACEMENT     ORIF RADIAL FRACTURE Right 12/24/2020   Procedure: OPEN REDUCTION INTERNAL FIXATION (ORIF) RIGHT RADIAL SHAFT FRACTURE;  Surgeon: Leandrew Koyanagi, MD;  Location: [place];  Service: Orthopedics;  Laterality: Right;   TOTAL HIP ARTHROPLASTY Right 09/24/2020   Procedure: RIGHT TOTAL HIP ARTHROPLASTY ANTERIOR APPROACH;  Surgeon: Mcarthur Rossetti, MD;  Location: [facility];  Service: Orthopedics;  Laterality:  Right;   TUBAL LIGATION       Current Outpatient Medications  Medication Sig Dispense Refill   albuterol (VENTOLIN HFA) 108 (90 Base) MCG/ACT inhaler Inhale 2 puffs into the lungs every 6 (six) hours as needed for wheezing or shortness of breath. 8 g PRN   ALPRAZolam (XANAX) 0.25 MG tablet TAKE 1 TABLET(0.25 MG) BY MOUTH TWICE DAILY AS NEEDED FOR ANXIETY 60 tablet 0   aspirin 81 MG chewable tablet Chew 1 tablet (81 mg total) by mouth 2 (two) times daily. (Patient taking differently: Chew 81 mg by mouth daily.) 30 tablet 0   b  complex vitamins tablet Take 1 tablet by mouth daily.     Calcium Citrate-Vitamin D (CALCIUM CITRATE + D PO) Take 2 tablets by mouth at bedtime. Mag     estradiol (ESTRACE) 2 MG tablet Take 2 mg by mouth at bedtime.      hydrochlorothiazide (HYDRODIURIL) 25 MG tablet TAKE 1 TABLET(25 MG) BY MOUTH DAILY 90 tablet 3   Hypromellose, PF, (RETAINE HPMC) 0.3 % SOLN Place 1 drop into both eyes daily as needed (Dry eyes).      Magnesium 400 MG TABS Take 400 mg by mouth at bedtime.     Multiple Vitamins-Minerals (MULTIVITAMIN WITH MINERALS) tablet Take 1 tablet by mouth daily.     olmesartan (BENICAR) 40 MG tablet TAKE 1 TABLET(40 MG) BY MOUTH DAILY 90 tablet 0   Omega-3 Fatty Acids (EQL OMEGA 3 FISH OIL PO) Take 565 mg by mouth daily. Complete     polyethylene glycol (MIRALAX / GLYCOLAX) packet Take 17 g by mouth 2 (two) times daily. (Patient taking differently: Take 17 g by mouth daily as needed for mild constipation.) 14 each 0   rosuvastatin (CRESTOR) 10 MG tablet Take 1 tablet (10 mg total) by mouth daily. 90 tablet 3   Saline 0.9 % AERS Place 1 spray into both nostrils 2 (two) times daily as needed (congestion).      zolpidem (AMBIEN) 10 MG tablet TAKE 1 TABLET(10 MG) BY MOUTH AT BEDTIME AS NEEDED FOR SLEEP 30 tablet 0   No current facility-administered medications for this visit.    Allergies as of 05/03/2022   (No Known Allergies)    Family History  Problem Relation Age of Onset   Heart failure Mother    Hypertension Father    Cancer Father        lung   Stroke Father    Heart disease Brother    Alcoholism Brother    Colon cancer Neg Hx    Pancreatic cancer Neg Hx    Esophageal cancer Neg Hx    Liver cancer Neg Hx    Stomach cancer Neg Hx       Physical Exam: General:   Alert,  well-nourished, pleasant and cooperative in NAD Head:  Normocephalic and atraumatic. Eyes:  Sclera clear, no icterus.   Conjunctiva pink. Ears:  Normal auditory acuity. Nose:  No deformity,  discharge,  or lesions. Mouth:  No deformity or lesions.   Neck:  Supple; no masses or thyromegaly. Lungs:  Clear throughout to auscultation.   No wheezes. Heart:  Regular rate and rhythm; no murmurs. Abdomen:  Soft, nontender, nondistended, normal bowel sounds, no rebound or guarding. No hepatosplenomegaly.   Rectal:  Deferred  Msk:  Symmetrical. No boney deformities LAD: No inguinal or umbilical LAD Extremities:  No clubbing or edema. Neurologic:  Alert and  oriented x4;  grossly nonfocal Skin:  Intact without significant lesions  or rashes. Psych:  Alert and cooperative. Normal mood and affect.  Meliss Fleek L. Tarri Glenn, MD, MPH 05/03/2022, 1:31 PM

## 2022-05-03 NOTE — ED Notes (Signed)
RN at bedside to assess pt. Pt in NSR at this time without care team intervention. EKG obtained, given to Onset MD.

## 2022-05-03 NOTE — Op Note (Addendum)
[facility] Patient Name: Jeanne Smith Procedure Date: 05/03/2022 2:16 PM MRN: [id number] Endoscopist: Thornton Park MD, MD Age: 71 Referring MD:  Date of Birth: [birth year] Gender: Female Account #: [id number] Procedure:                Colonoscopy Indications:              Rectal bleeding, Change in bowel habits Medicines:                Monitored Anesthesia Care Procedure:                Pre-Anesthesia Assessment:                           - Prior to the procedure, a History and Physical                            was performed, and patient medications and                            allergies were reviewed. The patient's tolerance of                            previous anesthesia was also reviewed. The risks                            and benefits of the procedure and the sedation                            options and risks were discussed with the patient.                            All questions were answered, and informed consent                            was obtained. Prior Anticoagulants: The patient has                            taken no previous anticoagulant or antiplatelet                            agents. ASA Grade Assessment: II - A patient with                            mild systemic disease. After reviewing the risks                            and benefits, the patient was deemed in                            satisfactory condition to undergo the procedure.                           After obtaining informed consent, the colonoscope  was passed under direct vision. Throughout the                            procedure, the patient's blood pressure, pulse, and                            oxygen saturations were monitored continuously. The                            CF [id number] #[id number] was introduced through the anus                            with the intention of advancing to the ileum. The                            scope was  advanced to the transverse colon before                            the procedure was aborted. Medications were given.                            The colonoscopy was technically difficult and                            complex due to a redundant colon, significant                            looping, a tortuous colon and the patient's                            cardiovascular instability (arrhythmia). I was                            unable to advance beyond the hepatic flexure due to                            associated SVT that occurred with each attempt to                            advance around the flexure. Her heart rate was as                            high as 155. The patient tolerated the procedure                            poorly. The quality of the bowel preparation was                            good. Scope In: 2:23:47 PM Scope Out: 2:49:45 PM Total Procedure Duration: 0 hours 25 minutes 58 seconds  Findings:                 The perianal and digital rectal examinations were  normal.                           An ulcerated non-obstructing large mass was found                            in the rectum located 12-14 cm from the anal verge.                            The mass was non-circumferential. Multiple biopsies                            were obtained with a cold forceps for histology.                            Area was tattooed with an injection of 2 mL of Spot                            (carbon black).                           Two sessile polyps were found in the descending                            colon. The polyps were 2 to 3 mm in size. These                            polyps were removed with a cold snare. Resection                            and retrieval were complete. Estimated blood loss                            was minimal.                           A 4-5 mm polyp was found in the proximal transverse                             colon. The polyp was sessile. The polyp was removed                            with a cold snare. Resection and retrieval were                            complete. Estimated blood loss was minimal. Complications:            Supraventricular tachycardia (SVT) occurred during                            the procedure and treated with esmolol, emesis                            occurred once during the procedure  and again in the                            recovery room without any witnessed aspiration and                            was treated with Zofran Estimated Blood Loss:     Estimated blood loss was minimal. 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 Biopsied.                            Tattooed.                           - Two 2 to 3 mm polyps in the descending colon,                            removed with a cold snare. Resected and retrieved.                           - One 4-5 mm polyp in the proximal transverse                            colon, removed with a cold snare. Resected and                            retrieved.                           - Right side of the colon was not examined due to                            the SVT that occurred during the procedure. Recommendation:           - Patient has a contact number available for                            emergencies. The signs and symptoms of potential                            delayed complications were discussed with the                            patient. Return to normal activities tomorrow.                            Written discharge instructions were provided to the                            patient.                           - Labs today including CBC, CMP, and CEA.                           -  12 Lead EKG.                           - CT chest/abd/pelvis.                           - Resume previous diet.                           - Continue present medications.                           -  Await pathology results.                           - Surgical consultation (She has seen Dr. Marcello Moores in                            the past). Thornton Park MD, MD 05/03/2022 3:05:00 PM This report has been signed electronically.

## 2022-05-03 NOTE — Telephone Encounter (Signed)
Per Dr. Tarri Glenn, patient will need CMP, CBC, CEA, CT chest/abd/pelvis and referral to surgery for a new colon mass. Labs & CT have been ordered. Schedulers have been notified. Referral has been placed to [facility] for patient to be seen with Dr. Marcello Moores. Will contact patient once she is home, patient is currently still recovering in [place].

## 2022-05-03 NOTE — H&P (View-Only) (Signed)
  She developed what initially looked like SVT during her colonoscopy, but, on 12 lead in the recovery room was a fib. Heart rate to 155. Esmolol given. Reported substernal chest pain when her heart rate was over 130 in the recovery room. No prior history of atrial fibrillation.   Nonbloody emesis occurred once during the procedure and again in the recovery room. No witnessed aspiration. Zofran given.   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 She is very tearful and upset. Chest pain developed prior to knowing the results of the procedure.   She will go to the ED for further evaluation. A copy of the 12 lead EKG showing atrial fibrillation was given to the patient. Labs including CMP, CBC, and CEA will hopefully be drawn in the ED. Outpatient CT scan and surgical consultation with Dr. Marcello Moores are being arranged.

## 2022-05-03 NOTE — Progress Notes (Signed)
Pt taken to recovery room; monitors attached and report given to receiving RN.   During the colonoscopy she vomited a small amount of bile colored fluid (approximately 67m). Oral suctioning was promptly performed, and '4mg'$  of Zofran was administered. Shortly after vomiting, she became tachycardic with a rate of 155 bpm. Esmolol '20mg'$  was given. Two additional doses of '20mg'$ , and '10mg'$  were given during the procedure, and she received another '20mg'$  dose following a 12 lead EKG for a rate of 135-140bpm. She also received a second '4mg'$  dose of Zofran in recovery for N/V. EMS was called to transport her to the ED to evaluate for AFIB with RVR.

## 2022-05-03 NOTE — ED Notes (Signed)
Pt verbalizes understanding of discharge instructions. Opportunity for questions and answers were provided. Pt discharged from the ED.   ?

## 2022-05-03 NOTE — ED Provider Notes (Signed)
[facility] EMERGENCY DEPARTMENT Provider Note  CSN: [id number] Arrival date & time: 05/03/22 1611  Chief Complaint(s) Chest Pain  HPI Jeanne Smith is a 71 y.o. female who presents emergency department for evaluation of rapid heart rate and chest pain.  Patient was receiving a colonoscopy today in the outpatient setting with GI when she was found to be in A-fib with RVR with rates in the 140s.  She was then transferred to the emergency department for further evaluation.  On arrival, patient does have an irregular tachycardia and complains of palpitations and a mild pressure-like chest pain but denies shortness of breath, abdominal pain, nausea, vomiting, diaphoresis or other systemic symptoms.   Past Medical History Past Medical History:  Diagnosis Date   Anxiety    Arthritis    oa   Attention deficit disorder    Dysrhythmia 2019   episode of palpitations x 25 January 2018, stress test normal   H/O seasonal allergies    Hyperlipidemia    Hypertension    Hypothyroidism    thyroid nodules, off synthroid > 15 years   Insomnia    Osteoarthritis    Osteopenia    Thyroid nodule    Patient Active Problem List   Diagnosis Date Noted   Closed Galeazzi's fracture of left radius 12/21/2020   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   Retained orthopedic hardware 06/15/2020   Stenosing tenosynovitis of wrist 04/01/2019   Closed fracture of right wrist 01/29/2019   Pain in right wrist 01/21/2019   Pain in right hand 01/21/2019   S/P left THA, AA 05/16/2018   S/P hip replacement 05/16/2018   Knee pain 03/28/2018   Pure hypercholesterolemia 12/24/2017   Intertrochanteric fracture of right femur ([place]) 06/15/2015   Comminuted right humeral fracture 06/15/2015   HTN (hypertension) 06/15/2015   Home Medication(s) Prior to Admission medications   Medication Sig Start Date End Date Taking? Authorizing Provider  albuterol (VENTOLIN HFA) 108 (90 Base) MCG/ACT  inhaler Inhale 2 puffs into the lungs every 6 (six) hours as needed for wheezing or shortness of breath. 10/25/21  Yes Baxley, Cresenciano Lick, MD  ALPRAZolam Duanne Moron) 0.5 MG tablet Take 0.5 mg by mouth 2 (two) times daily as needed for anxiety.   Yes [provider]  aspirin 81 MG chewable tablet Chew 1 tablet (81 mg total) by mouth 2 (two) times daily. Patient taking differently: Chew 81 mg by mouth in the morning. 09/25/20  Yes Mcarthur Rossetti, MD  estradiol (ESTRACE) 2 MG tablet Take 2 mg by mouth at bedtime.  04/21/19  Yes [provider]  hydrochlorothiazide (HYDRODIURIL) 25 MG tablet TAKE 1 TABLET(25 MG) BY MOUTH DAILY Patient taking differently: Take 25 mg by mouth daily. 10/03/21  Yes Baxley, Cresenciano Lick, MD  Hypromellose, PF, (RETAINE HPMC) 0.3 % SOLN Place 1 drop into both eyes 3 (three) times daily as needed (for dryness).   Yes [provider]  olmesartan (BENICAR) 40 MG tablet TAKE 1 TABLET(40 MG) BY MOUTH DAILY Patient taking differently: Take 40 mg by mouth in the morning. 03/09/22  Yes Baxley, Cresenciano Lick, MD  rosuvastatin (CRESTOR) 10 MG tablet Take 1 tablet (10 mg total) by mouth daily. Patient taking differently: Take 10 mg by mouth at bedtime. 01/19/22  Yes Baxley, Cresenciano Lick, MD  Saline 0.9 % AERS Place 1 spray into both nostrils 2 (two) times daily as needed (congestion).    Yes [provider]  zolpidem (AMBIEN) 10 MG  tablet TAKE 1 TABLET(10 MG) BY MOUTH AT BEDTIME AS NEEDED FOR SLEEP Patient taking differently: Take 10 mg by mouth at bedtime as needed for sleep. 01/17/21  Yes Baxley, Cresenciano Lick, MD  ALPRAZolam Duanne Moron) 0.25 MG tablet TAKE 1 TABLET(0.25 MG) BY MOUTH TWICE DAILY AS NEEDED FOR ANXIETY Patient not taking: Reported on 05/03/2022 01/17/21   Elby Showers, MD  b complex vitamins tablet Take 1 tablet by mouth daily. Patient not taking: Reported on 05/03/2022    [provider]  Calcium Citrate-Vitamin D (CALCIUM CITRATE + D PO) Take 2 tablets by  mouth at bedtime. Mag Patient not taking: Reported on 05/03/2022    [provider]  Magnesium 400 MG TABS Take 400 mg by mouth at bedtime. Patient not taking: Reported on 05/03/2022    [provider]  Multiple Vitamins-Minerals (MULTIVITAMIN WITH MINERALS) tablet Take 1 tablet by mouth daily. Patient not taking: Reported on 05/03/2022    [provider]  Omega-3 Fatty Acids (EQL OMEGA 3 FISH OIL PO) Take 565 mg by mouth daily. Complete Patient not taking: Reported on 05/03/2022    [provider]  polyethylene glycol (MIRALAX / GLYCOLAX) packet Take 17 g by mouth 2 (two) times daily. Patient not taking: Reported on 05/03/2022 05/17/18   Norman Herrlich                                                                                                                                    Past Surgical History Past Surgical History:  Procedure Laterality Date   ABDOMINAL HYSTERECTOMY     AUGMENTATION MAMMAPLASTY Bilateral    silicone over 30 years   COLONOSCOPY     CONVERSION TO TOTAL HIP Left 05/16/2018   Procedure: LEFT HIP ANTERIOR APPROACH;  Surgeon: Paralee Cancel, MD;  Location: [facility];  Service: Orthopedics;  Laterality: Left;   FEMUR IM NAIL Right 06/16/2015   Procedure: INTRAMEDULLARY (IM) NAIL FEMORAL;  Surgeon: Renette Butters, MD;  Location: [place];  Service: Orthopedics;  Laterality: Right;   HARDWARE REMOVAL Right 09/24/2020   Procedure: REMOVAL OF IM ROD/HIP SCREW RIGHT HIP;  Surgeon: Mcarthur Rossetti, MD;  Location: [facility];  Service: Orthopedics;  Laterality: Right;   HARDWARE REMOVAL Right 12/24/2020   Procedure: HARDWARE REMOVAL RIGHT WRIST;  Surgeon: Leandrew Koyanagi, MD;  Location: [place];  Service: Orthopedics;  Laterality: Right;   HIP SURGERY     JOINT REPLACEMENT     ORIF RADIAL FRACTURE Right 12/24/2020   Procedure: OPEN REDUCTION INTERNAL FIXATION (ORIF) RIGHT RADIAL SHAFT FRACTURE;  Surgeon: Leandrew Koyanagi, MD;   Location: [place];  Service: Orthopedics;  Laterality: Right;   TOTAL HIP ARTHROPLASTY Right 09/24/2020   Procedure: RIGHT TOTAL HIP ARTHROPLASTY ANTERIOR APPROACH;  Surgeon: Mcarthur Rossetti, MD;  Location: [facility];  Service: Orthopedics;  Laterality: Right;   TUBAL LIGATION  Family History Family History  Problem Relation Age of Onset   Heart failure Mother    Hypertension Father    Cancer Father        lung   Stroke Father    Heart disease Brother    Alcoholism Brother    Colon cancer Neg Hx    Pancreatic cancer Neg Hx    Esophageal cancer Neg Hx    Liver cancer Neg Hx    Stomach cancer Neg Hx     Social History Social History   Tobacco Use   Smoking status: Former    Packs/day: 0.25    Types: Cigarettes    Quit date: 11/27/2005    Years since quitting: 16.4   Smokeless tobacco: Never  Vaping Use   Vaping Use: Never used  Substance Use Topics   Alcohol use: Yes    Alcohol/week: 14.0 standard drinks    Types: 14 Glasses of wine per week    Comment: scotch or wine daily   Drug use: No   Allergies Patient has no known allergies.  Review of Systems Review of Systems  Respiratory:  Positive for chest tightness.   Cardiovascular:  Positive for chest pain and palpitations.   Physical Exam Vital Signs  I have reviewed the triage vital signs BP 131/86   Pulse 95   Temp 98.1 F (36.7 C) (Oral)   Resp 14   Ht '5\' 4"'$  (1.626 m)   Wt 59.9 kg   SpO2 98%   BMI 22.66 kg/m   Physical Exam Vitals and nursing note reviewed.  Constitutional:      General: She is not in acute distress.    Appearance: She is well-developed.  HENT:     Head: Normocephalic and atraumatic.  Eyes:     Conjunctiva/sclera: Conjunctivae normal.  Cardiovascular:     Rate and Rhythm: Tachycardia present. Rhythm irregular.     Heart sounds: No murmur heard. Pulmonary:     Effort: Pulmonary effort is normal. No respiratory distress.     Breath sounds: Normal  breath sounds.  Abdominal:     Palpations: Abdomen is soft.     Tenderness: There is no abdominal tenderness.  Musculoskeletal:        General: No swelling.     Cervical back: Neck supple.  Skin:    General: Skin is warm and dry.     Capillary Refill: Capillary refill takes less than 2 seconds.  Neurological:     Mental Status: She is alert.  Psychiatric:        Mood and Affect: Mood normal.    ED Results and Treatments Labs (all labs ordered are listed, but only abnormal results are displayed) Labs Reviewed  COMPREHENSIVE METABOLIC PANEL - Abnormal; Notable for the following components:      Result Value   Potassium 3.1 (*)    CO2 19 (*)    Glucose, Bld 106 (*)    Creatinine, Ser 1.21 (*)    Albumin 3.3 (*)    AST 56 (*)    GFR, Estimated 48 (*)    All other components within normal limits  CBC WITH DIFFERENTIAL/PLATELET - Abnormal; Notable for the following components:   RBC 3.77 (*)    MCV 102.4 (*)    MCH 34.7 (*)    All other components within normal limits  TROPONIN I (HIGH SENSITIVITY)  Radiology DG Chest Portable 1 View  Result Date: 05/03/2022 CLINICAL DATA:  Chest pain. EXAM: PORTABLE CHEST 1 VIEW COMPARISON:  Chest two views 10/18/2021 FINDINGS: Redemonstration of homogeneous density with peripheral calcification related to bilateral breast implants with capsular calcification. There is again flattening of the diaphragms and moderate hyperinflation. Increased lucencies within the upper lungs again suggestive of emphysematous changes. Mild-to-moderate bilateral interstitial thickening, greatest within the mid lungs, is similar to prior. No new airspace opacity is seen. No pleural effusion or pneumothorax. IMPRESSION: Chronic emphysematous changes.  No definite acute lung process. Electronically Signed   By: Yvonne Kendall M.D.   On: 05/03/2022 16:40     Pertinent labs & imaging results that were available during my care of the patient were reviewed by me and considered in my medical decision making (see MDM for details).  Medications Ordered in ED Medications  potassium chloride SA (KLOR-CON M) CR tablet 40 mEq (has no administration in time range)  magnesium oxide (MAG-OX) tablet 800 mg (has no administration in time range)                                                                                                                                     Procedures .Critical Care Performed by: Teressa Lower, MD Authorized by: Teressa Lower, MD   Critical care provider statement:    Critical care time (minutes):  30   Critical care was necessary to treat or prevent imminent or life-threatening deterioration of the following conditions:  Dehydration and cardiac failure   Critical care was time spent personally by me on the following activities:  Development of treatment plan with patient or surrogate, discussions with consultants, evaluation of patient's response to treatment, examination of patient, ordering and review of laboratory studies, ordering and review of radiographic studies, ordering and performing treatments and interventions, pulse oximetry, re-evaluation of patient's condition and review of old charts  (including critical care time)  Medical Decision Making / ED Course   This patient presents to the ED for concern of chest pain, rapid heart rate, this involves an extensive number of treatment options, and is a complaint that carries with it a high risk of complications and morbidity.  The differential diagnosis includes dehydration secondary to bowel cleanout, electrolyte abnormality, ACS, paroxysmal A-fib  MDM: Emergency room for evaluation of chest pain and rapid heart rate.  Physical exam overall unremarkable outside of an irregular tachycardia.  Initial ECG with A-fib with RVR.  Given that patient recently had a  bowel cleanout with [employer] for her procedure she is likely volume down and I suspect this is the cause of her A-fib with RVR today.  She received 1 L lactated Ringer's and self converted back to normal sinus rhythm.  Her evaluation with potassium 3.1 and a creatinine of 1.21.  Potassium and magnesium repleted.  Trop is normal.  Cardiology was consulted and I spoke with Dr. Tamala Julian  who states that given that we know the patient's trigger for her A-fib and she is likely going to require surgery for colonic mass removal, we will hold off on empiric anticoagulation at this time and favor close outpatient cardiology follow-up.  Cardiology referral back to the patient's primary cardiologist Dr. Martinique was placed and patient discharged.   Additional history obtained: -Additional history obtained from husband -External records from outside source obtained and reviewed including: Chart review including previous notes, labs, imaging, consultation notes   Lab Tests: -I ordered, reviewed, and interpreted labs.   The pertinent results include:   Labs Reviewed  COMPREHENSIVE METABOLIC PANEL - Abnormal; Notable for the following components:      Result Value   Potassium 3.1 (*)    CO2 19 (*)    Glucose, Bld 106 (*)    Creatinine, Ser 1.21 (*)    Albumin 3.3 (*)    AST 56 (*)    GFR, Estimated 48 (*)    All other components within normal limits  CBC WITH DIFFERENTIAL/PLATELET - Abnormal; Notable for the following components:   RBC 3.77 (*)    MCV 102.4 (*)    MCH 34.7 (*)    All other components within normal limits  TROPONIN I (HIGH SENSITIVITY)      EKG   EKG Interpretation  Date/Time:  Wednesday May 03 2022 16:37:19 EDT Ventricular Rate:  101 PR Interval:  168 QRS Duration: 112 QT Interval:  385 QTC Calculation: 500 R Axis:   31 Text Interpretation: Sinus tachycardia Borderline prolonged QT interval Confirmed by [place] ([id number]) on 05/03/2022 5:53:36 PM         Imaging Studies  ordered: I ordered imaging studies including chest x-ray I independently visualized and interpreted imaging. I agree with the radiologist interpretation   Medicines ordered and prescription drug management: Meds ordered this encounter  Medications   potassium chloride SA (KLOR-CON M) CR tablet 40 mEq   magnesium oxide (MAG-OX) tablet 800 mg    -I have reviewed the patients home medicines and have made adjustments as needed  Critical interventions Management of A-fib with RVR, rehydration  Consultations Obtained: I requested consultation with the cardiologist Dr. Tamala Julian,  and discussed lab and imaging findings as well as pertinent plan - they recommend: Discharge with close outpatient cardiology follow-up   Cardiac Monitoring: The patient was maintained on a cardiac monitor.  I personally viewed and interpreted the cardiac monitored which showed an underlying rhythm of: A-fib with RVR, normal sinus rhythm  Social Determinants of Health:  Factors impacting patients care include: none   Reevaluation: After the interventions noted above, I reevaluated the patient and found that they have :improved  Co morbidities that complicate the patient evaluation  Past Medical History:  Diagnosis Date   Anxiety    Arthritis    oa   Attention deficit disorder    Dysrhythmia 2019   episode of palpitations x 25 January 2018, stress test normal   H/O seasonal allergies    Hyperlipidemia    Hypertension    Hypothyroidism    thyroid nodules, off synthroid > 15 years   Insomnia    Osteoarthritis    Osteopenia    Thyroid nodule       Dispostion: I considered admission for this patient, and given patient's cell conversion to normal sinus rhythm and electrolytes repleted here in the emergency department she is safe for discharge with outpatient follow-up     Final Clinical Impression(s) / ED Diagnoses  Final diagnoses:  Atrial fibrillation with rapid ventricular response ([place])      '@PCDICTATION'$ @    Teressa Lower, MD 05/03/22 1754

## 2022-05-03 NOTE — Progress Notes (Signed)
Pt's states no medical or surgical changes since previsit or office visit. 

## 2022-05-03 NOTE — Progress Notes (Addendum)
  She developed what initially looked like SVT during her colonoscopy, but, on 12 lead in the recovery room was a fib. Heart rate to 155. Esmolol given. Reported substernal chest pain when her heart rate was over 130 in the recovery room. No prior history of atrial fibrillation.   Nonbloody emesis occurred once during the procedure and again in the recovery room. No witnessed aspiration. Zofran given.   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 She is very tearful and upset. Chest pain developed prior to knowing the results of the procedure.   She will go to the ED for further evaluation. A copy of the 12 lead EKG showing atrial fibrillation was given to the patient. Labs including CMP, CBC, and CEA will hopefully be drawn in the ED. Outpatient CT scan and surgical consultation with Dr. Marcello Moores are being arranged.

## 2022-05-03 NOTE — ED Triage Notes (Signed)
Pt BIB [facility] c/o CP and new onset afib rate 140s-180s after colonoscopy. Denies CP on arrival. A/o x4. Pt states CP may be anxiety-related due to not getting good colonoscopy results. BP 122/76

## 2022-05-04 ENCOUNTER — Telehealth: Payer: Self-pay

## 2022-05-04 ENCOUNTER — Telehealth (HOSPITAL_COMMUNITY): Payer: Self-pay

## 2022-05-04 NOTE — Telephone Encounter (Signed)
  Follow up Call-     05/03/2022    1:57 PM  Call back number  Post procedure Call Back phone  # [phone]  Permission to leave phone message Yes     Patient questions:  Do you have a fever, pain , or abdominal swelling? No. Pain Score  0 *  Have you tolerated food without any problems? Yes.    Have you been able to return to your normal activities? Yes.    Do you have any questions about your discharge instructions: Diet   No. Medications  No. Follow up visit  No.  Do you have questions or concerns about your Care? No.  Actions: * If pain score is 4 or above: No action needed, pain <4.  Pt states she spent most of yesterday in the ED but is doing fine today, had some bleeding on the toilet paper last night but not excessive bleeding.  Pt to call with any further concerns.  No pain today and planning to follow up with cardiology.

## 2022-05-04 NOTE — Telephone Encounter (Signed)
Spoke with patient regarding labs, CT, and referral. She is aware to come in at her earliest convenience during the week between 7:30am-5:00pm to the basement lab facility to have labs collected. CT schedulers were notified, and should be reaching out, however number has been provided to patient. Patient has been advised to call back in two weeks if she has not heard from [place]. Patient verbalized all understanding, no further questions.

## 2022-05-04 NOTE — Telephone Encounter (Signed)
Called pt to inform her that she left her clothing behind yesterday from her procedure.  Pt stated that she will come to pick them up today.  Pt's clothing has been left at the front desk in a Belongings bag for pick up.

## 2022-05-04 NOTE — Telephone Encounter (Signed)
Contacted patient regarding ED follow up appointment. She is scheduled to be seen on 6/21 @ 3:00pm with Adline Peals PA. Patient given directions to where our clinic is located. Patient verbalized understanding.

## 2022-05-12 ENCOUNTER — Ambulatory Visit (HOSPITAL_COMMUNITY)
Admission: RE | Admit: 2022-05-12 | Discharge: 2022-05-12 | Disposition: A | Discharge status: Home / Self Care | Payer: Medicare Other | Source: Ambulatory Visit | Attending: Gastroenterology | Admitting: Gastroenterology

## 2022-05-12 DIAGNOSIS — K6389 Other specified diseases of intestine: Secondary | ICD-10-CM | POA: Insufficient documentation

## 2022-05-12 DIAGNOSIS — I2510 Atherosclerotic heart disease of native coronary artery without angina pectoris: Secondary | ICD-10-CM | POA: Diagnosis not present

## 2022-05-12 DIAGNOSIS — D490 Neoplasm of unspecified behavior of digestive system: Secondary | ICD-10-CM | POA: Insufficient documentation

## 2022-05-12 DIAGNOSIS — I700 Atherosclerosis of aorta: Secondary | ICD-10-CM | POA: Diagnosis not present

## 2022-05-12 DIAGNOSIS — R921 Mammographic calcification found on diagnostic imaging of breast: Secondary | ICD-10-CM | POA: Diagnosis not present

## 2022-05-12 DIAGNOSIS — K3189 Other diseases of stomach and duodenum: Secondary | ICD-10-CM | POA: Diagnosis not present

## 2022-05-12 DIAGNOSIS — K760 Fatty (change of) liver, not elsewhere classified: Secondary | ICD-10-CM | POA: Diagnosis not present

## 2022-05-12 DIAGNOSIS — R920 Mammographic microcalcification found on diagnostic imaging of breast: Secondary | ICD-10-CM | POA: Diagnosis not present

## 2022-05-12 MED ORDER — SODIUM CHLORIDE (PF) 0.9 % IJ SOLN
INTRAMUSCULAR | Status: AC
  Filled 2022-05-12: qty 50

## 2022-05-12 MED ORDER — IOHEXOL 300 MG/ML  SOLN
100.0000 mL | Freq: Once | INTRAMUSCULAR | Status: AC | PRN
Start: 2022-05-12 — End: 2022-05-12
  Administered 2022-05-12: 80 mL via INTRAVENOUS

## 2022-05-16 DIAGNOSIS — C20 Malignant neoplasm of rectum: Secondary | ICD-10-CM | POA: Diagnosis not present

## 2022-05-17 ENCOUNTER — Encounter (HOSPITAL_COMMUNITY): Payer: Self-pay | Admitting: Nurse Practitioner

## 2022-05-17 ENCOUNTER — Ambulatory Visit (HOSPITAL_COMMUNITY)
Admission: RE | Admit: 2022-05-17 | Discharge: 2022-05-17 | Disposition: A | Discharge status: Home / Self Care | Payer: Medicare Other | Source: Ambulatory Visit | Attending: Nurse Practitioner | Admitting: Nurse Practitioner

## 2022-05-17 ENCOUNTER — Other Ambulatory Visit: Payer: Self-pay | Admitting: General Surgery

## 2022-05-17 VITALS — BP 86/60 | HR 91 | Ht 64.0 in | Wt 138.8 lb

## 2022-05-17 DIAGNOSIS — Z8249 Family history of ischemic heart disease and other diseases of the circulatory system: Secondary | ICD-10-CM | POA: Insufficient documentation

## 2022-05-17 DIAGNOSIS — I4891 Unspecified atrial fibrillation: Secondary | ICD-10-CM | POA: Diagnosis not present

## 2022-05-17 DIAGNOSIS — I10 Essential (primary) hypertension: Secondary | ICD-10-CM | POA: Insufficient documentation

## 2022-05-17 DIAGNOSIS — K625 Hemorrhage of anus and rectum: Secondary | ICD-10-CM | POA: Insufficient documentation

## 2022-05-17 DIAGNOSIS — K6389 Other specified diseases of intestine: Secondary | ICD-10-CM | POA: Insufficient documentation

## 2022-05-17 DIAGNOSIS — C20 Malignant neoplasm of rectum: Secondary | ICD-10-CM

## 2022-05-17 DIAGNOSIS — Z79899 Other long term (current) drug therapy: Secondary | ICD-10-CM | POA: Diagnosis not present

## 2022-05-17 DIAGNOSIS — Z87891 Personal history of nicotine dependence: Secondary | ICD-10-CM | POA: Insufficient documentation

## 2022-05-17 LAB — BASIC METABOLIC PANEL
Anion gap: 13 (ref 5–15)
BUN: 24 mg/dL — ABNORMAL HIGH (ref 8–23)
CO2: 21 mmol/L — ABNORMAL LOW (ref 22–32)
Calcium: 10.2 mg/dL (ref 8.9–10.3)
Chloride: 99 mmol/L (ref 98–111)
Creatinine, Ser: 1.47 mg/dL — ABNORMAL HIGH (ref 0.44–1.00)
GFR, Estimated: 38 mL/min — ABNORMAL LOW (ref >60)
Glucose, Bld: 93 mg/dL (ref 70–99)
Potassium: 3.9 mmol/L (ref 3.5–5.1)
Sodium: 133 mmol/L — ABNORMAL LOW (ref 135–145)

## 2022-05-17 LAB — MAGNESIUM: Magnesium: 2 mg/dL (ref 1.7–2.4)

## 2022-05-17 NOTE — Progress Notes (Addendum)
Primary Care Physician: Elby Showers, MD Referring Physician: ED f/u  Cardiologist: Dr. Martinique (not seen since 2019)   Jeanne Smith is a 71 y.o. female with a h/o HTN that developed afib with RVR associated with chest pain  with  v rates in the 140's during colonoscopy and was transferred to the ED for further treatment. ED MD thought that dehydration secondary to bowel cleanse,as she went back into SR after IV hydration. Her k+/mag were also low and repleted. She had 4 polps removed and a bx on the colon mass. She is having some rectal bleeding since then.  Since she is pending GI surgery for colonic mass, with some rectal bleeding, cardiology did not favor start of anticoagulation at this time. CHA2DS2VASc  score of 3.  She saw Dr. Martinique for an episode for tachyrhythmia and chest pain in 2019 which had resolved by time of appointment. She states that it felt very similar. Her w/u then with a stress test was ok. She had been on metoprolol in the remote past for HTN but was stopped and olmesartan was started around that time. Her BP today is very soft. She states that is labile with  fluctuations  low to high.   She states she was told  that there was another mass seen on colonoscopy but the MD could not get to it, but prior to surgery, MD wants to  her to have colonoscopy repeated and a MRI. She is very anxious to have this taken care of. She is in SR today. No further feelings of afib. No prior echo.    Today, she denies symptoms of palpitations, chest pain, shortness of breath, orthopnea, PND, lower extremity edema, dizziness, presyncope, syncope, or neurologic sequela. The patient is tolerating medications without difficulties and is otherwise without complaint today.   Past Medical History:  Diagnosis Date   Anxiety    Arthritis    oa   Attention deficit disorder    Dysrhythmia 2019   episode of palpitations x 25 January 2018, stress test normal   H/O seasonal allergies     Hyperlipidemia    Hypertension    Hypothyroidism    thyroid nodules, off synthroid > 15 years   Insomnia    Osteoarthritis    Osteopenia    Thyroid nodule    Past Surgical History:  Procedure Laterality Date   ABDOMINAL HYSTERECTOMY     AUGMENTATION MAMMAPLASTY Bilateral    silicone over 30 years   COLONOSCOPY     CONVERSION TO TOTAL HIP Left 05/16/2018   Procedure: LEFT HIP ANTERIOR APPROACH;  Surgeon: Paralee Cancel, MD;  Location: [facility];  Service: Orthopedics;  Laterality: Left;   FEMUR IM NAIL Right 06/16/2015   Procedure: INTRAMEDULLARY (IM) NAIL FEMORAL;  Surgeon: Renette Butters, MD;  Location: [place];  Service: Orthopedics;  Laterality: Right;   HARDWARE REMOVAL Right 09/24/2020   Procedure: REMOVAL OF IM ROD/HIP SCREW RIGHT HIP;  Surgeon: Mcarthur Rossetti, MD;  Location: [facility];  Service: Orthopedics;  Laterality: Right;   HARDWARE REMOVAL Right 12/24/2020   Procedure: HARDWARE REMOVAL RIGHT WRIST;  Surgeon: Leandrew Koyanagi, MD;  Location: [place];  Service: Orthopedics;  Laterality: Right;   HIP SURGERY     JOINT REPLACEMENT     ORIF RADIAL FRACTURE Right 12/24/2020   Procedure: OPEN REDUCTION INTERNAL FIXATION (ORIF) RIGHT RADIAL SHAFT FRACTURE;  Surgeon: Leandrew Koyanagi, MD;  Location: [facility];  Service: Orthopedics;  Laterality: Right;   TOTAL HIP ARTHROPLASTY Right 09/24/2020   Procedure: RIGHT TOTAL HIP ARTHROPLASTY ANTERIOR APPROACH;  Surgeon: Mcarthur Rossetti, MD;  Location: [facility];  Service: Orthopedics;  Laterality: Right;   TUBAL LIGATION      Current Outpatient Medications  Medication Sig Dispense Refill   albuterol (VENTOLIN HFA) 108 (90 Base) MCG/ACT inhaler Inhale 2 puffs into the lungs every 6 (six) hours as needed for wheezing or shortness of breath. 8 g PRN   ALPRAZolam (XANAX) 0.25 MG tablet TAKE 1 TABLET(0.25 MG) BY MOUTH TWICE DAILY AS NEEDED FOR ANXIETY (Patient not taking: Reported on 05/03/2022) 60 tablet  0   ALPRAZolam (XANAX) 0.5 MG tablet Take 0.5 mg by mouth 2 (two) times daily as needed for anxiety.     aspirin 81 MG chewable tablet Chew 1 tablet (81 mg total) by mouth 2 (two) times daily. (Patient taking differently: Chew 81 mg by mouth in the morning.) 30 tablet 0   b complex vitamins tablet Take 1 tablet by mouth daily. (Patient not taking: Reported on 05/03/2022)     Calcium Citrate-Vitamin D (CALCIUM CITRATE + D PO) Take 2 tablets by mouth at bedtime. Mag (Patient not taking: Reported on 05/03/2022)     estradiol (ESTRACE) 2 MG tablet Take 2 mg by mouth at bedtime.      hydrochlorothiazide (HYDRODIURIL) 25 MG tablet TAKE 1 TABLET(25 MG) BY MOUTH DAILY (Patient taking differently: Take 25 mg by mouth daily.) 90 tablet 3   Hypromellose, PF, (RETAINE HPMC) 0.3 % SOLN Place 1 drop into both eyes 3 (three) times daily as needed (for dryness).     Magnesium 400 MG TABS Take 400 mg by mouth at bedtime. (Patient not taking: Reported on 05/03/2022)     Multiple Vitamins-Minerals (MULTIVITAMIN WITH MINERALS) tablet Take 1 tablet by mouth daily. (Patient not taking: Reported on 05/03/2022)     olmesartan (BENICAR) 40 MG tablet TAKE 1 TABLET(40 MG) BY MOUTH DAILY (Patient taking differently: Take 40 mg by mouth in the morning.) 90 tablet 0   Omega-3 Fatty Acids (EQL OMEGA 3 FISH OIL PO) Take 565 mg by mouth daily. Complete (Patient not taking: Reported on 05/03/2022)     polyethylene glycol (MIRALAX / GLYCOLAX) packet Take 17 g by mouth 2 (two) times daily. (Patient not taking: Reported on 05/03/2022) 14 each 0   rosuvastatin (CRESTOR) 10 MG tablet Take 1 tablet (10 mg total) by mouth daily. (Patient taking differently: Take 10 mg by mouth at bedtime.) 90 tablet 3   Saline 0.9 % AERS Place 1 spray into both nostrils 2 (two) times daily as needed (congestion).      zolpidem (AMBIEN) 10 MG tablet TAKE 1 TABLET(10 MG) BY MOUTH AT BEDTIME AS NEEDED FOR SLEEP (Patient taking differently: Take 10 mg by mouth at bedtime as  needed for sleep.) 30 tablet 0   Current Facility-Administered Medications  Medication Dose Route Frequency Provider Last Rate Last Admin   0.9 %  sodium chloride infusion  500 mL Intravenous Once Thornton Park, MD        No Known Allergies  Social History   Socioeconomic History   Marital status: Widowed    Spouse name: Not on file   Number of children: 3   Years of education: Not on file   Highest education level: Not on file  Occupational History   Not on file  Tobacco Use   Smoking status: Former    Packs/day: 0.25    Types: Cigarettes  Quit date: 11/27/2005    Years since quitting: 16.4   Smokeless tobacco: Never  Vaping Use   Vaping Use: Never used  Substance and Sexual Activity   Alcohol use: Yes    Alcohol/week: 14.0 standard drinks of alcohol    Types: 14 Glasses of wine per week    Comment: scotch or wine daily   Drug use: No   Sexual activity: Not on file  Other Topics Concern   Not on file  Social History Narrative   Not on file   Social Determinants of Health   Financial Resource Strain: Not on file  Food Insecurity: Not on file  Transportation Needs: Not on file  Physical Activity: Not on file  Stress: Not on file  Social Connections: Not on file  Intimate Partner Violence: Not on file    Family History  Problem Relation Age of Onset   Heart failure Mother    Hypertension Father    Cancer Father        lung   Stroke Father    Heart disease Brother    Alcoholism Brother    Colon cancer Neg Hx    Pancreatic cancer Neg Hx    Esophageal cancer Neg Hx    Liver cancer Neg Hx    Stomach cancer Neg Hx     ROS- All systems are reviewed and negative except as per the HPI above  Physical Exam: There were no vitals filed for this visit. Wt Readings from Last 3 Encounters:  05/03/22 59.9 kg  05/03/22 59.9 kg  03/24/22 59.9 kg    Labs: Lab Results  Component Value Date   NA 137 05/03/2022   K 3.1 (L) 05/03/2022   CL 105 05/03/2022    CO2 19 (L) 05/03/2022   GLUCOSE 106 (H) 05/03/2022   BUN 18 05/03/2022   CREATININE 1.21 (H) 05/03/2022   CALCIUM 9.2 05/03/2022   Lab Results  Component Value Date   INR 0.98 06/15/2015   Lab Results  Component Value Date   CHOL 275 (H) 01/13/2022   HDL 82 01/13/2022   LDLCALC 160 (H) 01/13/2022   TRIG 179 (H) 01/13/2022     GEN- The patient is well appearing, alert and oriented x 3 today.   Head- normocephalic, atraumatic Eyes-  Sclera clear, conjunctiva pink Ears- hearing intact Oropharynx- clear Neck- supple, no JVP Lymph- no cervical lymphadenopathy Lungs- Clear to ausculation bilaterally, normal work of breathing Heart- Regular rate and rhythm, no murmurs, rubs or gallops, PMI not laterally displaced GI- soft, NT, ND, + BS Extremities- no clubbing, cyanosis, or edema MS- no significant deformity or atrophy Skin- no rash or lesion Psych- euthymic mood, full affect Neuro- strength and sensation are intact  EKG-Vent. rate 91 BPM PR interval 158 ms QRS duration 102 ms QT/QTcB 404/496 ms P-R-T axes 62 18 62 Normal sinus rhythm Incomplete right bundle branch block Prolonged QT Abnormal ECG When compared with ECG of 03-May-2022 16:37, PREVIOUS ECG IS PRESENT  2019- lexi myoview-  The left ventricular ejection fraction is hyperdynamic (>65%). Nuclear stress EF: 67%. No wall motion abnormality. There was no ST segment deviation noted during stress. This is a low risk study. No ischemia identified. The study is normal.   Candee Furbish, MD  Assessment and Plan:  1. Afib  Occurred in the setting of dehydration and electrolyte disturbance during colonoscopy and spontaneously  resolved with hydration dn electrolyte repletion  General education re afib   Triggers discussed  Will recheck bmet/mag  to assure electrolytes are still in normal range  She does drink 2 glasses of wine a night and I have asked her to cut back on alcohol Echo ordered   2. CHA2DS2VASc   score of 3 With rectal bleeding,colonic mass and pending GI surgery will follow cardiology recommendations in the ER and not start anticoagulation at this time    3. Colonic mass Pending further w/u but surgery is pending in the near future I will go ahead and refer back to Dr. Martinique or associates for pt to obtain cardiac clearance  Consideration for start of beta blocker perioperatively to assure SR   4. HTN Hypotensive today Have asked her to check her BP at home and if consistently below 110 systolic then contact PCP for BP med adjustment   Jeanne Smith, [facility] [address] [place], [place] [address] [phone]

## 2022-05-18 ENCOUNTER — Telehealth: Payer: Self-pay

## 2022-05-18 ENCOUNTER — Other Ambulatory Visit: Payer: Self-pay

## 2022-05-18 ENCOUNTER — Ambulatory Visit (HOSPITAL_COMMUNITY)
Admission: RE | Admit: 2022-05-18 | Discharge: 2022-05-18 | Disposition: A | Discharge status: Home / Self Care | Payer: Medicare Other | Source: Ambulatory Visit | Attending: Nurse Practitioner | Admitting: Nurse Practitioner

## 2022-05-18 DIAGNOSIS — I4891 Unspecified atrial fibrillation: Secondary | ICD-10-CM | POA: Insufficient documentation

## 2022-05-18 DIAGNOSIS — I517 Cardiomegaly: Secondary | ICD-10-CM | POA: Insufficient documentation

## 2022-05-18 DIAGNOSIS — Z8249 Family history of ischemic heart disease and other diseases of the circulatory system: Secondary | ICD-10-CM | POA: Insufficient documentation

## 2022-05-18 DIAGNOSIS — Z87891 Personal history of nicotine dependence: Secondary | ICD-10-CM | POA: Diagnosis not present

## 2022-05-18 DIAGNOSIS — K6389 Other specified diseases of intestine: Secondary | ICD-10-CM

## 2022-05-18 DIAGNOSIS — C189 Malignant neoplasm of colon, unspecified: Secondary | ICD-10-CM | POA: Insufficient documentation

## 2022-05-18 LAB — ECHOCARDIOGRAM COMPLETE
Area-P 1/2: 2.99 cm2
S' Lateral: 1.5 cm

## 2022-05-18 MED ORDER — CLENPIQ 10-3.5-12 MG-GM -GM/160ML PO SOLN: 1.0000 | ORAL | 0 refills | Status: DC

## 2022-05-18 NOTE — Telephone Encounter (Signed)
Patient is aware of the tentative plan for colon next week 6/29. Prep has been sent to pharmacy & instructions sent to mychart. Will contact patient once confirmation is received from both MD & cardiac clearance.

## 2022-05-18 NOTE — Telephone Encounter (Signed)
Per Dr. Tarri Glenn, patient needs urgent colonoscopy at the hospital for evaluation of colonic mass. Patient is tentatively scheduled for 6/29 at 7:30 am at [facility], pending MD availability & patient's cardiac clearance.

## 2022-05-18 NOTE — Telephone Encounter (Signed)
[facility] Pre-operative Risk Assessment     Request for surgical clearance:     Endoscopy Procedure  What type of surgery is being performed?     Colonoscopy  When is this surgery scheduled?     05/25/22  What type of clearance is required ?   Cardiac   Are there any medications that need to be held prior to surgery and how long?   Practice name and name of physician performing surgery?      [facility]  What is your office phone and fax number?      Phone- [phone]  [phone]  Anesthesia type (None, local, MAC, general) ?       MAC

## 2022-05-19 ENCOUNTER — Encounter (HOSPITAL_COMMUNITY): Payer: Self-pay | Admitting: *Deleted

## 2022-05-22 NOTE — Telephone Encounter (Signed)
Patient has been made aware of cardiac recommendations & to proceed with colonoscopy on Thursday. Dr. Chales Abrahams did confirm availability to take on procedure. Patient has instructions available on mychart & will be picking up prep today. Patient advised to call back with any questions.

## 2022-05-24 ENCOUNTER — Telehealth: Payer: Self-pay | Admitting: Cardiology

## 2022-05-24 NOTE — Anesthesia Preprocedure Evaluation (Signed)
Anesthesia Evaluation  Patient identified by MRN, date of birth, ID band Patient awake    Reviewed: Allergy & Precautions, NPO status , Patient's Chart, lab work & pertinent test results  History of Anesthesia Complications Negative for: history of anesthetic complications  Airway Mallampati: III  TM Distance: >3 FB Neck ROM: Full    Dental no notable dental hx. (+) Dental Advisory Given   Pulmonary former smoker,    Pulmonary exam normal        Cardiovascular hypertension, Pt. on medications + dysrhythmias  Rhythm:Regular Rate:Tachycardia   '19 Myoperfusion - The left ventricular ejection fraction is hyperdynamic (>65%). Nuclear stress EF: 67%. No wall motion abnormality. There was no ST segment deviation noted during stress. This is a low risk study. No ischemia identified. The study is normal.     Neuro/Psych PSYCHIATRIC DISORDERS Anxiety negative neurological ROS     GI/Hepatic negative GI ROS, Neg liver ROS, Colon mass   Endo/Other  Hypothyroidism (formerly on synthroid, no mesd x 10 yrs)   Renal/GU negative Renal ROS     Musculoskeletal  (+) Arthritis ,   Abdominal   Peds  (+) ATTENTION DEFICIT DISORDER WITHOUT HYPERACTIVITY Hematology  Thrombocytopenia, Plt 139k    Anesthesia Other Findings Covid test negative   Reproductive/Obstetrics                           Anesthesia Physical  Anesthesia Plan  ASA: 2  Anesthesia Plan: MAC   Post-op Pain Management: Minimal or no pain anticipated   Induction:   PONV Risk Score and Plan: 2 and Treatment may vary due to age or medical condition, Propofol infusion and TIVA  Airway Management Planned: Natural Airway and Simple Face Mask  Additional Equipment: None  Intra-op Plan:   Post-operative Plan:   Informed Consent: I have reviewed the patients History and Physical, chart, labs and discussed the procedure including  the risks, benefits and alternatives for the proposed anesthesia with the patient or authorized representative who has indicated his/her understanding and acceptance.     Dental advisory given  Plan Discussed with: Anesthesiologist and CRNA  Anesthesia Plan Comments:        Anesthesia Quick Evaluation

## 2022-05-24 NOTE — Telephone Encounter (Signed)
Spoke to patient she stated she is scheduled to have a colonoscopy tomorrow morning.Stated she is very anxious.Stated her B/P was alittle elevated and she thought Dr.[facility] would prescribe B/P medication.Advised B/P readings listed below are ok.Advised try and relax,being anxious will cause elevated B/P.Advised to keep appointment with Dr.[facility] 7/17 at 10:00 am.Dr.[facility] is out of office today.I will make him aware.

## 2022-05-24 NOTE — Telephone Encounter (Signed)
Pt c/o BP issue: STAT if pt c/o blurred vision, one-sided weakness or slurred speech  1. What are your last 5 BP readings?  102/62 134/97 122/86 131/83 142/81  2. Are you having any other symptoms (ex. Dizziness, headache, blurred vision, passed out)? no  3. What is your BP issue? Pt states that she was taken off all of her bp medication and because her bp has been high she wants to know if should she take bp medicine before her colonoscopy tomorrow morning. She states that she is just concerned because she doesn't want to go into afib.

## 2022-05-25 ENCOUNTER — Encounter (HOSPITAL_COMMUNITY)
Admission: RE | Disposition: A | Discharge status: Home / Self Care | Payer: Self-pay | Source: Home / Self Care | Attending: Gastroenterology

## 2022-05-25 ENCOUNTER — Other Ambulatory Visit: Payer: Self-pay

## 2022-05-25 ENCOUNTER — Ambulatory Visit (HOSPITAL_COMMUNITY): Payer: Medicare Other | Admitting: Anesthesiology

## 2022-05-25 ENCOUNTER — Ambulatory Visit (HOSPITAL_COMMUNITY)
Admission: RE | Admit: 2022-05-25 | Discharge: 2022-05-25 | Disposition: A | Discharge status: Home / Self Care | Payer: Medicare Other | Attending: Gastroenterology | Admitting: Gastroenterology

## 2022-05-25 ENCOUNTER — Ambulatory Visit (HOSPITAL_BASED_OUTPATIENT_CLINIC_OR_DEPARTMENT_OTHER): Payer: Medicare Other | Admitting: Anesthesiology

## 2022-05-25 DIAGNOSIS — Z8371 Family history of colonic polyps: Secondary | ICD-10-CM | POA: Diagnosis not present

## 2022-05-25 DIAGNOSIS — Z87891 Personal history of nicotine dependence: Secondary | ICD-10-CM | POA: Insufficient documentation

## 2022-05-25 DIAGNOSIS — Z79899 Other long term (current) drug therapy: Secondary | ICD-10-CM | POA: Insufficient documentation

## 2022-05-25 DIAGNOSIS — K635 Polyp of colon: Secondary | ICD-10-CM | POA: Diagnosis not present

## 2022-05-25 DIAGNOSIS — K640 First degree hemorrhoids: Secondary | ICD-10-CM | POA: Diagnosis not present

## 2022-05-25 DIAGNOSIS — C20 Malignant neoplasm of rectum: Secondary | ICD-10-CM

## 2022-05-25 DIAGNOSIS — Z96643 Presence of artificial hip joint, bilateral: Secondary | ICD-10-CM | POA: Insufficient documentation

## 2022-05-25 DIAGNOSIS — F419 Anxiety disorder, unspecified: Secondary | ICD-10-CM | POA: Diagnosis not present

## 2022-05-25 DIAGNOSIS — K6389 Other specified diseases of intestine: Secondary | ICD-10-CM

## 2022-05-25 DIAGNOSIS — I10 Essential (primary) hypertension: Secondary | ICD-10-CM | POA: Insufficient documentation

## 2022-05-25 DIAGNOSIS — K921 Melena: Secondary | ICD-10-CM | POA: Diagnosis not present

## 2022-05-25 DIAGNOSIS — K5909 Other constipation: Secondary | ICD-10-CM | POA: Diagnosis not present

## 2022-05-25 DIAGNOSIS — R933 Abnormal findings on diagnostic imaging of other parts of digestive tract: Secondary | ICD-10-CM | POA: Diagnosis not present

## 2022-05-25 DIAGNOSIS — D123 Benign neoplasm of transverse colon: Secondary | ICD-10-CM | POA: Insufficient documentation

## 2022-05-25 HISTORY — PX: COLONOSCOPY WITH PROPOFOL: SHX5780

## 2022-05-25 HISTORY — PX: POLYPECTOMY: SHX5525

## 2022-05-25 SURGERY — COLONOSCOPY WITH PROPOFOL
Anesthesia: Monitor Anesthesia Care

## 2022-05-25 SURGERY — Surgical Case
Anesthesia: *Unknown

## 2022-05-25 MED ORDER — ONDANSETRON HCL 4 MG/2ML IJ SOLN
INTRAMUSCULAR | Status: DC | PRN
  Administered 2022-05-25: 4 mg via INTRAVENOUS

## 2022-05-25 MED ORDER — PROPOFOL 1000 MG/100ML IV EMUL
INTRAVENOUS | Status: AC
  Filled 2022-05-25: qty 100

## 2022-05-25 MED ORDER — PROPOFOL 500 MG/50ML IV EMUL
INTRAVENOUS | Status: DC | PRN
  Administered 2022-05-25: 30 mg via INTRAVENOUS
  Administered 2022-05-25 (×4): 20 mg via INTRAVENOUS
  Administered 2022-05-25: 30 mg via INTRAVENOUS
  Administered 2022-05-25: 20 mg via INTRAVENOUS
  Administered 2022-05-25: 140 ug/kg/min via INTRAVENOUS
  Administered 2022-05-25 (×2): 20 mg via INTRAVENOUS

## 2022-05-25 MED ORDER — SODIUM CHLORIDE 0.9 % IV SOLN: INTRAVENOUS | Status: DC

## 2022-05-25 MED ORDER — PROPOFOL 500 MG/50ML IV EMUL
INTRAVENOUS | Status: AC
  Filled 2022-05-25: qty 50

## 2022-05-25 MED ORDER — LACTATED RINGERS IV SOLN: INTRAVENOUS | Status: DC

## 2022-05-25 SURGICAL SUPPLY — 22 items

## 2022-05-25 NOTE — Anesthesia Postprocedure Evaluation (Signed)
Anesthesia Post Note  Patient: Jeanne Smith  Procedure(s) Performed: COLONOSCOPY WITH PROPOFOL POLYPECTOMY     Patient location during evaluation: PACU Anesthesia Type: MAC Level of consciousness: awake and alert Pain management: pain level controlled Vital Signs Assessment: post-procedure vital signs reviewed and stable Respiratory status: spontaneous breathing and respiratory function stable Cardiovascular status: stable Postop Assessment: no apparent nausea or vomiting Anesthetic complications: no   No notable events documented.  Last Vitals:  Vitals:   05/25/22 0826 05/25/22 0840  BP: (!) 141/80 (!) 145/78  Pulse: 99 98  Resp: 20 (!) 22  Temp: 36.5 C   SpO2: 100% 97%    Last Pain:  Vitals:   05/25/22 0850  TempSrc:   PainSc: 0-No pain                 Miaa Latterell DANIEL

## 2022-05-25 NOTE — Transfer of Care (Signed)
Immediate Anesthesia Transfer of Care Note  Patient: Jeanne Smith  Procedure(s) Performed: COLONOSCOPY WITH PROPOFOL POLYPECTOMY  Patient Location: PACU and Endoscopy Unit  Anesthesia Type:MAC  Level of Consciousness: awake, alert , oriented and patient cooperative  Airway & Oxygen Therapy: Patient Spontanous Breathing and Patient connected to face mask oxygen  Post-op Assessment: Report given to RN, Post -op Vital signs reviewed and stable and Patient moving all extremities  Post vital signs: Reviewed and stable  Last Vitals:  Vitals Value Taken Time  BP 141/80 05/25/22 0826  Temp    Pulse 97 05/25/22 0827  Resp 22 05/25/22 0827  SpO2 100 % 05/25/22 0827  Vitals shown include unvalidated device data.  Last Pain:  Vitals:   05/25/22 0704  TempSrc: Temporal  PainSc: 0-No pain         Complications: No notable events documented.

## 2022-05-25 NOTE — Op Note (Signed)
[facility] Patient Name: Jeanne Smith Procedure Date: 05/25/2022 MRN: [id number] Attending MD: [name] , MD Date of Birth: 1951/11/11 CSN: [id number] Age: 71 Admit Type: Outpatient Procedure:                Colonoscopy Indications:              Recently Dx rectal adenocarcinoma. CT chest                            Abdo/pelvis showed questionable right colonic                            lesion. Previous colonoscopy had to be stopped due                            to A-fib/RVR. Providers:                [name], MD, Mikey College, RN, Gloris Ham, Technician Referring MD:             Dr Leighton Ruff Medicines:                Monitored Anesthesia Care Complications:            No immediate complications. Estimated Blood Loss:     Estimated blood loss: none. Procedure:                Pre-Anesthesia Assessment:                           - Prior to the procedure, a History and Physical                            was performed, and patient medications and                            allergies were reviewed. The patient's tolerance of                            previous anesthesia was also reviewed. The risks                            and benefits of the procedure and the sedation                            options and risks were discussed with the patient.                            All questions were answered, and informed consent                            was obtained. Prior Anticoagulants: The patient has                            taken no previous anticoagulant  or antiplatelet                            agents. ASA Grade Assessment: III - A patient with                            severe systemic disease. After reviewing the risks                            and benefits, the patient was deemed in                            satisfactory condition to undergo the procedure.                           After obtaining informed  consent, the colonoscope                            was passed under direct vision. Throughout the                            procedure, the patient's blood pressure, pulse, and                            oxygen saturations were monitored continuously. The                            PCF-HQ190L ([id number]) Olympus colonoscope was                            introduced through the anus and advanced to the the                            cecum, identified by appendiceal orifice and                            ileocecal valve. The colonoscopy was performed with                            moderate difficulty due to significant looping.                            Successful completion of the procedure was aided by                            applying abdominal pressure. The patient tolerated                            the procedure well. The quality of the bowel                            preparation was good. The ileocecal valve,  appendiceal orifice, and rectum were photographed. Scope In: 7:41:04 AM Scope Out: 8:19:39 AM Scope Withdrawal Time: 0 hours 14 minutes 9 seconds  Total Procedure Duration: 0 hours 38 minutes 35 seconds  Findings:      An ulcerated non-obstructing large, noncircumferential mass was found in       the proximal rectum/rectosigmoid from 12-14 cm. A tattoo was noted at       the distal end. Not biopsied since previous biopsies were consistent       with adenocarcinoma.      A 6 mm polyp was found in the proximal transverse colon. The polyp was       sessile. The polyp was removed with a cold snare. Resection and       retrieval were complete.      Non-bleeding internal hemorrhoids were found during retroflexion. The       hemorrhoids were small and Grade I (internal hemorrhoids that do not       prolapse).      The exam was otherwise without abnormality. Impression:               - Malignant tumor in the proximal rectum (as                             previously diagnosed and biopsied).                           - One 6 mm polyp in the proximal transverse colon,                            removed with a cold snare. Resected and retrieved.                           - Non-bleeding internal hemorrhoids.                           - The examination was otherwise normal. No right                            colon masses/lesions. Moderate Sedation:      Not Applicable - Patient had care per Anesthesia. Recommendation:           - Patient has a contact number available for                            emergencies. The signs and symptoms of potential                            delayed complications were discussed with the                            patient. Return to normal activities tomorrow.                            Written discharge instructions were provided to the                            patient.                           -  Resume previous diet.                           - Continue present medications.                           - Await pathology results.                           - Proceed with MRI pelvis as scheduled.                           - FU appointment with Dr. Marcello Moores                           - The findings and recommendations were discussed                            with the patient's family. Procedure Code(s):        --- Professional ---                           [phone], Colonoscopy, flexible; with removal of                            tumor(s), polyp(s), or other lesion(s) by snare                            technique Diagnosis Code(s):        --- Professional ---                           C20, Malignant neoplasm of rectum                           K63.5, Polyp of colon                           K64.0, First degree hemorrhoids                           R93.3, Abnormal findings on diagnostic imaging of                            other parts of digestive tract CPT copyright 2019 [employer]. All rights  reserved. The codes documented in this report are preliminary and upon coder review may  be revised to meet current compliance requirements. [name], MD 05/25/2022 8:42:37 AM This report has been signed electronically. Number of Addenda: 0

## 2022-05-25 NOTE — Progress Notes (Signed)
Abdominal pressure applied per dr Lyndel Safe instructions

## 2022-05-25 NOTE — Discharge Instructions (Signed)
YOU HAD AN ENDOSCOPIC PROCEDURE TODAY: Refer to the procedure report and other information in the discharge instructions given to you for any specific questions about what was found during the examination. If this information does not answer your questions, please call [place] office at [phone] to clarify.  ° °YOU SHOULD EXPECT: Some feelings of bloating in the abdomen. Passage of more gas than usual. Walking can help get rid of the air that was put into your GI tract during the procedure and reduce the bloating. If you had a lower endoscopy (such as a colonoscopy or flexible sigmoidoscopy) you may notice spotting of blood in your stool or on the toilet paper. Some abdominal soreness may be present for a day or two, also. ° °DIET: Your first meal following the procedure should be a light meal and then it is ok to progress to your normal diet. A half-sandwich or bowl of soup is an example of a good first meal. Heavy or fried foods are harder to digest and may make you feel nauseous or bloated. Drink plenty of fluids but you should avoid alcoholic beverages for 24 hours. If you had a esophageal dilation, please see attached instructions for diet.   ° °ACTIVITY: Your care partner should take you home directly after the procedure. You should plan to take it easy, moving slowly for the rest of the day. You can resume normal activity the day after the procedure however YOU SHOULD NOT DRIVE, use power tools, machinery or perform tasks that involve climbing or major physical exertion for 24 hours (because of the sedation medicines used during the test).  ° °SYMPTOMS TO REPORT IMMEDIATELY: °A gastroenterologist can be reached at any hour. Please call [phone]  for any of the following symptoms:  °Following lower endoscopy (colonoscopy, flexible sigmoidoscopy) °Excessive amounts of blood in the stool  °Significant tenderness, worsening of abdominal pains  °Swelling of the abdomen that is new, acute  °Fever of 100° or  higher  °Following upper endoscopy (EGD, EUS, ERCP, esophageal dilation) °Vomiting of blood or coffee ground material  °New, significant abdominal pain  °New, significant chest pain or pain under the shoulder blades  °Painful or persistently difficult swallowing  °New shortness of breath  °Black, tarry-looking or red, bloody stools ° °FOLLOW UP:  °If any biopsies were taken you will be contacted by phone or by letter within the next 1-3 weeks. Call [phone]  if you have not heard about the biopsies in 3 weeks.  °Please also call with any specific questions about appointments or follow up tests. ° °

## 2022-05-25 NOTE — Telephone Encounter (Signed)
Agree  Lenzi Marmo MD, FACC   

## 2022-05-25 NOTE — Interval H&P Note (Signed)
History and Physical Interval Note:  05/25/2022 7:32 AM  Jeanne Smith  has presented today for surgery, with the diagnosis of Colonic mass.  The various methods of treatment have been discussed with the patient and family. After consideration of risks, benefits and other options for treatment, the patient has consented to  Procedure(s): COLONOSCOPY WITH PROPOFOL (N/A) as a surgical intervention.  The patient's history has been reviewed, patient examined, no change in status, stable for surgery.  I have reviewed the patient's chart and labs.  Questions were answered to the patient's satisfaction.     [name]

## 2022-05-26 ENCOUNTER — Encounter (HOSPITAL_COMMUNITY): Payer: Self-pay | Admitting: Gastroenterology

## 2022-05-26 LAB — SURGICAL PATHOLOGY

## 2022-06-05 ENCOUNTER — Ambulatory Visit
Admission: RE | Admit: 2022-06-05 | Discharge: 2022-06-05 | Disposition: A | Discharge status: Home / Self Care | Payer: Medicare Other | Source: Ambulatory Visit | Attending: General Surgery | Admitting: General Surgery

## 2022-06-05 DIAGNOSIS — C20 Malignant neoplasm of rectum: Secondary | ICD-10-CM

## 2022-06-06 ENCOUNTER — Ambulatory Visit
Admission: RE | Admit: 2022-06-06 | Discharge: 2022-06-06 | Disposition: A | Discharge status: Home / Self Care | Payer: Medicare Other | Source: Ambulatory Visit | Attending: General Surgery | Admitting: General Surgery

## 2022-06-06 DIAGNOSIS — K6289 Other specified diseases of anus and rectum: Secondary | ICD-10-CM | POA: Diagnosis not present

## 2022-06-06 DIAGNOSIS — C20 Malignant neoplasm of rectum: Secondary | ICD-10-CM | POA: Diagnosis not present

## 2022-06-06 DIAGNOSIS — D375 Neoplasm of uncertain behavior of rectum: Secondary | ICD-10-CM | POA: Diagnosis not present

## 2022-06-06 NOTE — Progress Notes (Signed)
Cardiology Office Note   Date:  06/12/2022   ID:  Jeanne Smith, Jeanne Smith 06-16-1951, MRN [id number]  PCP:  Elby Showers, MD  Cardiologist:   Joelle Flessner Martinique, MD   Chief Complaint  Patient presents with   Atrial Fibrillation   Chest Pain   Coronary Artery Disease    History of Present Illness: Jeanne Smith is a 71 y.o. female who is seen for evaluation of AFib. She was last  seen by me in 2019. She has a history of HTN and HLD. She had a prior Nuclear stress test in 2019 which was normal. She was started on Crestor for HLD. BP was treated with Bystolic and ARB.   On June 7 she developed afib with RVR associated with chest pain  with  ventricular rates in the 140's during colonoscopy and was transferred to the ED for further treatment. ED MD thought that dehydration secondary to bowel cleanse,as she went back into SR after IV hydration. Her k+/mag were also low and repleted. She had 4 polps removed and a bx on the colon mass. Repeat colonoscopy and MRI planned. Since she was pending possible GI surgery for colonic mass, with some rectal bleeding, was seen in AFib clinic and did not favor start of anticoagulation at this  time. CHA2DS2VASc  score of 3.  Echo done showing mild LVH with normal systolic function, severe LAE, normal valves.   Has been seen by Leighton Ruff MD with general surgery. MRI showed no extension of tumor in the rectum. Felt favorable for primary resection. Repeat colonoscopy showed one polyp in the transverse colon that was removed. Seeing surgery tomorrow.   On follow up today she reports she had significant chest pressure one night at 11 pm. Similar but not as severe as when she had Afib. Notes BP running higher since olmesartan and HCTZ held for low BP. Under a lot of stress. Notes also has problems with pain in right low back radiating to right hip and down leg.   .     Past Medical History:  Diagnosis Date   Anxiety    Arthritis    oa   Attention deficit  disorder    Dysrhythmia 2019   episode of palpitations x 25 January 2018, stress test normal   H/O seasonal allergies    Hyperlipidemia    Hypertension    Hypothyroidism    thyroid nodules, off synthroid > 15 years   Insomnia    Osteoarthritis    Osteopenia    Thyroid nodule     Past Surgical History:  Procedure Laterality Date   ABDOMINAL HYSTERECTOMY     AUGMENTATION MAMMAPLASTY Bilateral    silicone over 30 years   COLONOSCOPY     COLONOSCOPY WITH PROPOFOL N/A 05/25/2022   Procedure: COLONOSCOPY WITH PROPOFOL;  Surgeon: [name], MD;  Location: [facility];  Service: Gastroenterology;  Laterality: N/A;   CONVERSION TO TOTAL HIP Left 05/16/2018   Procedure: LEFT HIP ANTERIOR APPROACH;  Surgeon: Paralee Cancel, MD;  Location: [facility];  Service: Orthopedics;  Laterality: Left;   FEMUR IM NAIL Right 06/16/2015   Procedure: INTRAMEDULLARY (IM) NAIL FEMORAL;  Surgeon: Renette Butters, MD;  Location: [facility];  Service: Orthopedics;  Laterality: Right;   HARDWARE REMOVAL Right 09/24/2020   Procedure: REMOVAL OF IM ROD/HIP SCREW RIGHT HIP;  Surgeon: Mcarthur Rossetti, MD;  Location: [facility];  Service: Orthopedics;  Laterality: Right;   HARDWARE REMOVAL Right 12/24/2020   Procedure:  HARDWARE REMOVAL RIGHT WRIST;  Surgeon: Leandrew Koyanagi, MD;  Location: [facility];  Service: Orthopedics;  Laterality: Right;   HIP SURGERY     JOINT REPLACEMENT     ORIF RADIAL FRACTURE Right 12/24/2020   Procedure: OPEN REDUCTION INTERNAL FIXATION (ORIF) RIGHT RADIAL SHAFT FRACTURE;  Surgeon: Leandrew Koyanagi, MD;  Location: [place];  Service: Orthopedics;  Laterality: Right;   POLYPECTOMY  05/25/2022   Procedure: POLYPECTOMY;  Surgeon: [name], MD;  Location: [facility];  Service: Gastroenterology;;   TOTAL HIP ARTHROPLASTY Right 09/24/2020   Procedure: RIGHT TOTAL HIP ARTHROPLASTY ANTERIOR APPROACH;  Surgeon: Mcarthur Rossetti, MD;  Location: [facility];   Service: Orthopedics;  Laterality: Right;   TUBAL LIGATION       Current Outpatient Medications  Medication Sig Dispense Refill   albuterol (VENTOLIN HFA) 108 (90 Base) MCG/ACT inhaler Inhale 2 puffs into the lungs every 6 (six) hours as needed for wheezing or shortness of breath. 8 g PRN   ALPRAZolam (XANAX) 0.5 MG tablet Take 0.5 mg by mouth 2 (two) times daily as needed for anxiety.     aspirin 81 MG chewable tablet Chew 1 tablet (81 mg total) by mouth 2 (two) times daily. (Patient taking differently: Chew 81 mg by mouth in the morning.) 30 tablet 0   b complex vitamins tablet Take 1 tablet by mouth daily.     Calcium Citrate-Vitamin D (CALCIUM CITRATE + D PO) Take 2 tablets by mouth at bedtime. Mag     Doxylamine Succinate, Sleep, (SLEEP AID PO) Take 3 tablets by mouth at bedtime. Benadryl in it     estradiol (ESTRACE) 2 MG tablet Take 2 mg by mouth at bedtime.      Hypromellose, PF, (RETAINE HPMC) 0.3 % SOLN Place 1 drop into both eyes 3 (three) times daily as needed (for dryness).     Magnesium 250 MG TABS Take 250 mg by mouth at bedtime.     metoprolol succinate (TOPROL XL) 25 MG 24 hr tablet Take 1 tablet (25 mg total) by mouth daily. 90 tablet 3   Multiple Vitamins-Minerals (MULTIVITAMIN WITH MINERALS) tablet Take 1 tablet by mouth daily.     olmesartan (BENICAR) 20 MG tablet Take 0.5 tablets (10 mg total) by mouth daily. 45 tablet 3   Omega-3 Fatty Acids (EQL OMEGA 3 FISH OIL PO) Take 565 mg by mouth daily. Complete     polyethylene glycol (MIRALAX / GLYCOLAX) packet Take 17 g by mouth 2 (two) times daily. 14 each 0   rosuvastatin (CRESTOR) 40 MG tablet Take 1 tablet (40 mg total) by mouth daily. 90 tablet 3   Saline 0.9 % AERS Place 1 spray into both nostrils 2 (two) times daily as needed (congestion).      Current Facility-Administered Medications  Medication Dose Route Frequency Provider Last Rate Last Admin   0.9 %  sodium chloride infusion  500 mL Intravenous Once [place], MD        Allergies:   Patient has no known allergies.    Social History:  The patient  reports that she quit smoking about 16 years ago. Her smoking use included cigarettes. She smoked an average of .25 packs per day. She has never used smokeless tobacco. She reports current alcohol use of about 14.0 standard drinks of alcohol per week. She reports that she does not use drugs.   Family History:  The patient's family history includes Alcoholism in her brother;  Cancer in her father; Heart disease in her brother; Heart failure in her mother; Hypertension in her father; Stroke in her father.    ROS:  Please see the history of present illness.   Otherwise, review of systems are positive for none.   All other systems are reviewed and negative.    PHYSICAL EXAM: VS:  BP (!) 148/92   Pulse (!) 102   Ht 5' 4.5" (1.638 m)   Wt 142 lb 6.4 oz (64.6 kg)   SpO2 97%   BMI 24.07 kg/m  , BMI Body mass index is 24.07 kg/m. GEN: Well nourished, well developed, in no acute distress  HEENT: normal  Neck: no JVD, carotid bruits, or masses Cardiac: RRR; no murmurs, rubs, or gallops,no edema  Respiratory:  clear to auscultation bilaterally, normal work of breathing GI: soft, nontender, nondistended, + BS MS: no deformity or atrophy  Skin: warm and dry, no rash Neuro:  Strength and sensation are intact Psych: euthymic mood, full affect   EKG:  EKG is  ordered today. Sinus tachy rate 102. Low voltage. Incomplete RBBB. I have personally reviewed and interpreted this study.     Recent Labs: 01/13/2022: TSH 1.68 05/03/2022: ALT 33; Hemoglobin 13.1; Platelets 175 05/17/2022: BUN 24; Creatinine, Ser 1.47; Magnesium 2.0; Potassium 3.9; Sodium 133    Lipid Panel    Component Value Date/Time   CHOL 275 (H) 01/13/2022 0902   TRIG 179 (H) 01/13/2022 0902   HDL 82 01/13/2022 0902   CHOLHDL 3.4 01/13/2022 0902   VLDL 25 06/04/2017 0909   LDLCALC 160 (H) 01/13/2022 0902      Wt Readings  from Last 3 Encounters:  06/12/22 142 lb 6.4 oz (64.6 kg)  05/25/22 138 lb (62.6 kg)  05/17/22 138 lb 12.8 oz (63 kg)      Other studies Reviewed: Additional studies/ records that were reviewed today include:   Stress Myoview: 02/13/18: Study Highlights     The left ventricular ejection fraction is hyperdynamic (>65%). Nuclear stress EF: 67%. No wall motion abnormality. There was no ST segment deviation noted during stress. This is a low risk study. No ischemia identified. The study is normal.   Candee Furbish, MD   Echo 05/18/22: IMPRESSIONS     1. Left ventricular ejection fraction, by estimation, is 70 to 75%. Left  ventricular ejection fraction by PLAX is 75 %. The left ventricle has  hyperdynamic function. The left ventricle has no regional wall motion  abnormalities. There is mild left  ventricular hypertrophy. Left ventricular diastolic parameters are  consistent with Grade I diastolic dysfunction (impaired relaxation).   2. Right ventricular systolic function is normal. The right ventricular  size is normal.   3. Left atrial size was severely dilated.   4. The mitral valve is grossly normal. Trivial mitral valve  regurgitation.   5. The aortic valve is tricuspid. Aortic valve regurgitation is not  visualized.   6. The inferior vena cava is normal in size with greater than 50%  respiratory variability, suggesting right atrial pressure of 3 mmHg.   Comparison(s): No prior Echocardiogram.    ASSESSMENT AND PLAN:  1.  Atrial fibrillation paroxysmal. In NSR today. [place] Vasc of 3. Will defer anticoagulation now given some persistent rectal bleeding. HR is elevated today in NSR. Will start Toprol XL 25 mg daily  2. HTN - was hypotensive on olmesartan 40 mg daily and HCTZ. These were held. Will resume olmesartan at 10 mg daily and add Toprol XL 25  mg daily.   3.CAD. review of chest CT in June shows significant coronary and aortic atherosclerosis. She has had 2 episodes of  chest pain. Will continue ASA. Last myoview in 2019 was normal. Given need for general anesthesia I have recommended a Lexiscan Myoview to assess pre op risk.   4. Hypercholesterolemia. Last LDL 160 on Crestor 10 mg daily. Goal LDL < 70 given significant coronary and aortic calcification. Will increase Crestor to 40 mg daily. Repeat lipids in 3 months.  5. Right low back pain with symptoms concerning for sciatica. Will need follow up with ortho/Neurosurgery  6. Rectal carcinoma. Plan surgery once cleared from our standpoint.     Current medicines are reviewed at length with the patient today.  The patient does not have concerns regarding medicines.    Signed, Miyu Fenderson Martinique, MD  06/12/2022 10:30 AM    [facility] [address] [place], [place], [address] Phone [phone], Fax [phone]

## 2022-06-09 ENCOUNTER — Other Ambulatory Visit: Payer: Medicare Other

## 2022-06-12 ENCOUNTER — Encounter: Payer: Self-pay | Admitting: Cardiology

## 2022-06-12 ENCOUNTER — Ambulatory Visit: Payer: Medicare Other | Admitting: Cardiology

## 2022-06-12 ENCOUNTER — Ambulatory Visit (INDEPENDENT_AMBULATORY_CARE_PROVIDER_SITE_OTHER): Payer: Medicare Other | Admitting: Cardiology

## 2022-06-12 VITALS — BP 148/92 | HR 102 | Ht 64.5 in | Wt 142.4 lb

## 2022-06-12 DIAGNOSIS — I25118 Atherosclerotic heart disease of native coronary artery with other forms of angina pectoris: Secondary | ICD-10-CM

## 2022-06-12 DIAGNOSIS — I480 Paroxysmal atrial fibrillation: Secondary | ICD-10-CM

## 2022-06-12 DIAGNOSIS — E7800 Pure hypercholesterolemia, unspecified: Secondary | ICD-10-CM | POA: Diagnosis not present

## 2022-06-12 DIAGNOSIS — R079 Chest pain, unspecified: Secondary | ICD-10-CM

## 2022-06-12 MED ORDER — ROSUVASTATIN CALCIUM 40 MG PO TABS: 40.0000 mg | ORAL_TABLET | Freq: Every day | ORAL | 3 refills | Status: DC

## 2022-06-12 MED ORDER — METOPROLOL SUCCINATE ER 25 MG PO TB24: 25.0000 mg | ORAL_TABLET | Freq: Every day | ORAL | 3 refills | Status: DC

## 2022-06-12 MED ORDER — OLMESARTAN MEDOXOMIL 20 MG PO TABS: 10.0000 mg | ORAL_TABLET | Freq: Every day | ORAL | 3 refills | Status: DC

## 2022-06-12 NOTE — Patient Instructions (Addendum)
We will increase Crestor to 40 mg daily  Resume olmesartan at 10 mg daily  Start Toprol XL 25 mg daily  We will arrange a Nuclear stress test  ( Lexiscan )    Follow up appointment in 4 weeks

## 2022-06-13 ENCOUNTER — Ambulatory Visit: Payer: Self-pay | Admitting: General Surgery

## 2022-06-13 ENCOUNTER — Ambulatory Visit (HOSPITAL_COMMUNITY)
Admission: RE | Admit: 2022-06-13 | Discharge: 2022-06-13 | Disposition: A | Discharge status: Home / Self Care | Payer: Medicare Other | Source: Ambulatory Visit | Attending: Cardiology | Admitting: Cardiology

## 2022-06-13 DIAGNOSIS — I25118 Atherosclerotic heart disease of native coronary artery with other forms of angina pectoris: Secondary | ICD-10-CM | POA: Diagnosis not present

## 2022-06-13 DIAGNOSIS — E7800 Pure hypercholesterolemia, unspecified: Secondary | ICD-10-CM | POA: Diagnosis not present

## 2022-06-13 DIAGNOSIS — C20 Malignant neoplasm of rectum: Secondary | ICD-10-CM | POA: Diagnosis not present

## 2022-06-13 DIAGNOSIS — R079 Chest pain, unspecified: Secondary | ICD-10-CM | POA: Insufficient documentation

## 2022-06-13 DIAGNOSIS — I480 Paroxysmal atrial fibrillation: Secondary | ICD-10-CM | POA: Insufficient documentation

## 2022-06-13 LAB — MYOCARDIAL PERFUSION IMAGING
LV dias vol: 59 mL (ref 46–106)
LV sys vol: 14 mL
Nuc Stress EF: 76 %
Peak HR: 106 {beats}/min
Rest HR: 93 {beats}/min
Rest Nuclear Isotope Dose: 10.5 mCi
SDS: 2
SRS: 1
SSS: 3
ST Depression (mm): 0 mm
Stress Nuclear Isotope Dose: 29 mCi
TID: 0.95

## 2022-06-13 MED ORDER — REGADENOSON 0.4 MG/5ML IV SOLN
0.4000 mg | Freq: Once | INTRAVENOUS | Status: AC
  Administered 2022-06-13: 0.4 mg via INTRAVENOUS

## 2022-06-13 MED ORDER — TECHNETIUM TC 99M TETROFOSMIN IV KIT
29.0000 | PACK | Freq: Once | INTRAVENOUS | Status: AC | PRN
  Administered 2022-06-13: 29 via INTRAVENOUS

## 2022-06-13 MED ORDER — TECHNETIUM TC 99M TETROFOSMIN IV KIT
10.5000 | PACK | Freq: Once | INTRAVENOUS | Status: AC | PRN
  Administered 2022-06-13: 10.5 via INTRAVENOUS

## 2022-06-13 NOTE — H&P (View-Only) (Signed)
REFERRING PHYSICIAN:  Shelva Majestic*   PROVIDER:  Monico Blitz, MD   MRN: [id number] DOB: 1951-03-22    Subjective    Chief Complaint: No chief complaint on file.       History of Present Illness: Patient underwent a colonoscopy on May 03, 2022 for rectal bleeding and changes in bowel habits.She was noted to have an ulcerated nonobstructing mass in the rectum approximately 12 to 14 cm from the anal verge.  Area was tattooed distally.  2 polyps in the descending colon and one polyp in the transverse colon were also removed.  She subsequently developed A-fib with RVR with rates in the 140s after her colonoscopy and was transferred to the emergency department for further evaluation.  Cardiology was consulted and did not recommend anticoagulation due to the thought that she converted to atrial fibrillation due to electrolyte or fluid imbalances and also due to need for future surgery.  Patient subsequently underwent a CT scan of the chest abdomen pelvis which raised concern for an ascending colon mass as well.  She underwent a follow-up colonoscopy on 05/25/2022.  The rectal mass was noted.  1 polyp was noted in the proximal transverse colon and the right colon was normal.  She has now underwent cardiology evaluation and a stress test will be performed for cardiac clearance.  MRI of the pelvis shows a T1/2 lesion N0.         Review of Systems: A complete review of systems was obtained from the patient.  I have reviewed this information and discussed as appropriate with the patient.  See HPI as well for other ROS.         Medical History: Past Medical History      Past Medical History:  Diagnosis Date   Anxiety     Arthritis     Hypertension          There is no problem list on file for this patient.     Past Surgical History       Past Surgical History:  Procedure Laterality Date   ARTHROPLASTY HIP TOTAL       HYSTERECTOMY       LAPAROSCOPIC TUBAL  LIGATION       TRANSUMBILICAL AUGMENTATION MAMMAPLASTY            Allergies  No Known Allergies           Current Outpatient Medications on File Prior to Visit  Medication Sig Dispense Refill   albuterol 90 mcg/actuation inhaler Inhale 2 inhalations into the lungs every 6 (six) hours as needed       ALPRAZolam (XANAX) 0.25 MG tablet TAKE 1 TABLET(0.25 MG) BY MOUTH TWICE DAILY AS NEEDED FOR ANXIETY       artificial tears,hypromellose, 0.3 % Drop Apply to eye       aspirin 81 MG chewable tablet Take by mouth       b complex vitamins tablet Take 1 tablet by mouth once daily       calcium citrate-vitamin D3 (CITRACAL+D) 315 mg-5 mcg (200 unit) tablet Take 1 tablet by mouth 2 (two) times daily       estradioL (ESTRACE) 2 MG tablet Take 2 mg by mouth once daily       hydroCHLOROthiazide (HYDRODIURIL) 25 MG tablet         magnesium oxide (MAG-OX) 400 mg (241.3 mg magnesium) tablet Take by mouth       multivitamin with minerals tablet Take 1  tablet by mouth once daily       olmesartan (BENICAR) 40 MG tablet         rosuvastatin (CRESTOR) 10 MG tablet         zolpidem (AMBIEN) 10 mg tablet Take 10 mg by mouth at bedtime        No current facility-administered medications on file prior to visit.      Family History       Family History  Problem Relation Age of Onset   High blood pressure (Hypertension) Mother          Social History        Tobacco Use  Smoking Status Former   Types: Cigarettes   Quit date: 2007   Years since quitting: 16.4  Smokeless Tobacco Never      Social History  Social History         Socioeconomic History   Marital status: Unknown  Tobacco Use   Smoking status: Former      Types: Cigarettes      Quit date: 2007      Years since quitting: 16.4   Smokeless tobacco: Never  Substance and Sexual Activity   Alcohol use: Yes   Drug use: Never        Objective:       Exam Gen: NAD CV: RRR Lungs: CTA Abd: soft       Labs, Imaging and  Diagnostic Testing: CT scan and MRI and colonoscopy reports and images reviewed   Assessment and Plan:  Diagnoses and all orders for this visit:   Malignant neoplasm of rectum (CMS-HCC)   71 year old female with rectal bleeding status post partial colonoscopy which had to be stopped due to atrial fibrillation with rapid ventricular response.  She is now back in normal sinus rhythm and is not anticoagulated at this time. She has completed a follow-up colonoscopy and no other masses were noted.  She is undergoing cardiac work-up at this time with nuclear stress test today.   The surgery and anatomy were described to the patient as well as the risks of surgery and the possible complications.  These include: Bleeding, deep abdominal infections and possible wound complications such as hernia and infection, damage to adjacent structures, leak of surgical connections, which can lead to other surgeries and possibly an ostomy, possible need for other procedures, such as abscess drains in radiology, possible prolonged hospital stay, possible diarrhea from removal of part of the colon, possible constipation from narcotics, possible bowel, bladder or sexual dysfunction if having rectal surgery, prolonged fatigue/weakness or appetite loss, possible early recurrence of of disease, possible complications of their medical problems such as heart disease or arrhythmias or lung problems, death (less than 1%). I believe the patient understands and wishes to proceed with the surgery.

## 2022-06-13 NOTE — H&P (Signed)
REFERRING PHYSICIAN:  Shelva Majestic*   PROVIDER:  Monico Blitz, MD   MRN: [id number] DOB: 1951-05-30    Subjective    Chief Complaint: No chief complaint on file.       History of Present Illness: Patient underwent a colonoscopy on May 03, 2022 for rectal bleeding and changes in bowel habits.She was noted to have an ulcerated nonobstructing mass in the rectum approximately 12 to 14 cm from the anal verge.  Area was tattooed distally.  2 polyps in the descending colon and one polyp in the transverse colon were also removed.  She subsequently developed A-fib with RVR with rates in the 140s after her colonoscopy and was transferred to the emergency department for further evaluation.  Cardiology was consulted and did not recommend anticoagulation due to the thought that she converted to atrial fibrillation due to electrolyte or fluid imbalances and also due to need for future surgery.  Patient subsequently underwent a CT scan of the chest abdomen pelvis which raised concern for an ascending colon mass as well.  She underwent a follow-up colonoscopy on 05/25/2022.  The rectal mass was noted.  1 polyp was noted in the proximal transverse colon and the right colon was normal.  She has now underwent cardiology evaluation and a stress test will be performed for cardiac clearance.  MRI of the pelvis shows a T1/2 lesion N0.         Review of Systems: A complete review of systems was obtained from the patient.  I have reviewed this information and discussed as appropriate with the patient.  See HPI as well for other ROS.         Medical History: Past Medical History      Past Medical History:  Diagnosis Date   Anxiety     Arthritis     Hypertension          There is no problem list on file for this patient.     Past Surgical History       Past Surgical History:  Procedure Laterality Date   ARTHROPLASTY HIP TOTAL       HYSTERECTOMY       LAPAROSCOPIC TUBAL  LIGATION       TRANSUMBILICAL AUGMENTATION MAMMAPLASTY            Allergies  No Known Allergies           Current Outpatient Medications on File Prior to Visit  Medication Sig Dispense Refill   albuterol 90 mcg/actuation inhaler Inhale 2 inhalations into the lungs every 6 (six) hours as needed       ALPRAZolam (XANAX) 0.25 MG tablet TAKE 1 TABLET(0.25 MG) BY MOUTH TWICE DAILY AS NEEDED FOR ANXIETY       artificial tears,hypromellose, 0.3 % Drop Apply to eye       aspirin 81 MG chewable tablet Take by mouth       b complex vitamins tablet Take 1 tablet by mouth once daily       calcium citrate-vitamin D3 (CITRACAL+D) 315 mg-5 mcg (200 unit) tablet Take 1 tablet by mouth 2 (two) times daily       estradioL (ESTRACE) 2 MG tablet Take 2 mg by mouth once daily       hydroCHLOROthiazide (HYDRODIURIL) 25 MG tablet         magnesium oxide (MAG-OX) 400 mg (241.3 mg magnesium) tablet Take by mouth       multivitamin with minerals tablet Take 1  tablet by mouth once daily       olmesartan (BENICAR) 40 MG tablet         rosuvastatin (CRESTOR) 10 MG tablet         zolpidem (AMBIEN) 10 mg tablet Take 10 mg by mouth at bedtime        No current facility-administered medications on file prior to visit.      Family History       Family History  Problem Relation Age of Onset   High blood pressure (Hypertension) Mother          Social History        Tobacco Use  Smoking Status Former   Types: Cigarettes   Quit date: 2007   Years since quitting: 16.4  Smokeless Tobacco Never      Social History  Social History         Socioeconomic History   Marital status: Unknown  Tobacco Use   Smoking status: Former      Types: Cigarettes      Quit date: 2007      Years since quitting: 16.4   Smokeless tobacco: Never  Substance and Sexual Activity   Alcohol use: Yes   Drug use: Never        Objective:       Exam Gen: NAD CV: RRR Lungs: CTA Abd: soft       Labs, Imaging and  Diagnostic Testing: CT scan and MRI and colonoscopy reports and images reviewed   Assessment and Plan:  Diagnoses and all orders for this visit:   Malignant neoplasm of rectum (CMS-HCC)   71 year old female with rectal bleeding status post partial colonoscopy which had to be stopped due to atrial fibrillation with rapid ventricular response.  She is now back in normal sinus rhythm and is not anticoagulated at this time. She has completed a follow-up colonoscopy and no other masses were noted.  She is undergoing cardiac work-up at this time with nuclear stress test today.   The surgery and anatomy were described to the patient as well as the risks of surgery and the possible complications.  These include: Bleeding, deep abdominal infections and possible wound complications such as hernia and infection, damage to adjacent structures, leak of surgical connections, which can lead to other surgeries and possibly an ostomy, possible need for other procedures, such as abscess drains in radiology, possible prolonged hospital stay, possible diarrhea from removal of part of the colon, possible constipation from narcotics, possible bowel, bladder or sexual dysfunction if having rectal surgery, prolonged fatigue/weakness or appetite loss, possible early recurrence of of disease, possible complications of their medical problems such as heart disease or arrhythmias or lung problems, death (less than 1%). I believe the patient understands and wishes to proceed with the surgery.

## 2022-06-21 DIAGNOSIS — H04123 Dry eye syndrome of bilateral lacrimal glands: Secondary | ICD-10-CM | POA: Diagnosis not present

## 2022-06-27 ENCOUNTER — Other Ambulatory Visit (HOSPITAL_COMMUNITY): Payer: Self-pay

## 2022-06-27 NOTE — Patient Instructions (Signed)
DUE TO COVID-19 ONLY TWO VISITORS  (aged 71 and older)  ARE ALLOWED TO COME WITH YOU AND STAY IN THE WAITING ROOM ONLY DURING PRE OP AND PROCEDURE.   **NO VISITORS ARE ALLOWED IN THE SHORT STAY AREA OR RECOVERY ROOM!!**  IF YOU WILL BE ADMITTED INTO THE HOSPITAL YOU ARE ALLOWED ONLY FOUR SUPPORT PEOPLE DURING VISITATION HOURS ONLY (7 AM -8PM)   The support person(s) must pass our screening, gel in and out, and wear a mask at all times, including in the patient's room. Patients must also wear a mask when staff or their support person are in the room. Visitors GUEST BADGE MUST BE WORN VISIBLY  One adult visitor may remain with you overnight and MUST be in the room by 8 P.M.     Your procedure is scheduled on: 07/12/22   Report to [facility] Main Entrance    Report to admitting at : 10:45 AM   Call this number if you have problems the morning of surgery [phone]   Clear liquids starting the day before surgery until: 10:00 AM DAY OF SURGERY. Drink plenty fluids the day before surgery.  DRINK 2 PRESURGERY ENSURE DRINKS THE NIGHT BEFORE SURGERY AT  1000 PM AND 1 PRESURGERY DRINK THE DAY OF THE PROCEDURE 3 HOURS PRIOR TO SCHEDULED SURGERY. NO SOLIDS AFTER MIDNIGHT THE DAY PRIOR TO THE SURGERY. NOTHING BY MOUTH EXCEPT CLEAR LIQUIDS UNTIL THREE HOURS PRIOR TO SCHEDULED SURGERY. PLEASE FINISH PRESURGERY ENSURE DRINK PER SURGEON ORDER 3 HOURS PRIOR TO SCHEDULED SURGERY TIME WHICH NEEDS TO BE COMPLETED AT : 10:00 AM.  Water Black Coffee (sugar ok, NO MILK/CREAM OR CREAMERS)  Tea (sugar ok, NO MILK/CREAM OR CREAMERS) regular and decaf                             Plain Jell-O (NO RED)                                           Fruit ices (not with fruit pulp, NO RED)                                     Popsicles (NO RED)                                                                  Juice: apple, WHITE grape, WHITE cranberry Sports drinks like Gatorade (NO RED)   The day of surgery:   Drink ONE (1) Pre-Surgery Clear Ensure or G2 at AM the morning of surgery. Drink in one sitting. Do not sip.  This drink was given to you during your hospital  pre-op appointment visit. Nothing else to drink after completing the  Pre-Surgery Clear Ensure or G2.          If you have questions, please contact your surgeon's office. FOLLOW BOWEL PREP AND ANY ADDITIONAL PRE OP INSTRUCTIONS YOU RECEIVED FROM YOUR SURGEON'S OFFICE!!!    Oral Hygiene is also important to reduce your risk of infection.  Remember - BRUSH YOUR TEETH THE MORNING OF SURGERY WITH YOUR REGULAR TOOTHPASTE   Do NOT smoke after Midnight   Take these medicines the morning of surgery with A SIP OF WATER: metoprolol.Use inhalers as usual.Alprazolam as needed.  DO NOT TAKE ANY ORAL DIABETIC MEDICATIONS DAY OF YOUR SURGERY  Bring CPAP mask and tubing day of surgery.                              You may not have any metal on your body including hair pins, jewelry, and body piercing             Do not wear make-up, lotions, powders, perfumes/cologne, or deodorant  Do not wear nail polish including gel and S&S, artificial/acrylic nails, or any other type of covering on natural nails including finger and toenails. If you have artificial nails, gel coating, etc. that needs to be removed by a nail salon please have this removed prior to surgery or surgery may need to be canceled/ delayed if the surgeon/ anesthesia feels like they are unable to be safely monitored.   Do not shave  48 hours prior to surgery.    Do not bring valuables to the hospital. [place].   Contacts, dentures or bridgework may not be worn into surgery.   Bring small overnight bag day of surgery.   DO NOT [place]. PHARMACY WILL DISPENSE MEDICATIONS LISTED ON YOUR MEDICATION LIST TO YOU DURING YOUR ADMISSION [place]!    Patients discharged on the day of surgery will not be allowed to drive home.  Someone NEEDS to stay with you for the first 24 hours after anesthesia.   Special Instructions: Bring a copy of your healthcare power of attorney and living will documents         the day of surgery if you haven't scanned them before.              Please read over the following fact sheets you were given: IF YOU HAVE QUESTIONS ABOUT YOUR PRE-OP INSTRUCTIONS PLEASE CALL [phone]     [facility] - Preparing for Surgery Before surgery, you can play an important role.  Because skin is not sterile, your skin needs to be as free of germs as possible.  You can reduce the number of germs on your skin by washing with CHG (chlorahexidine gluconate) soap before surgery.  CHG is an antiseptic cleaner which kills germs and bonds with the skin to continue killing germs even after washing. Please DO NOT use if you have an allergy to CHG or antibacterial soaps.  If your skin becomes reddened/irritated stop using the CHG and inform your nurse when you arrive at Short Stay. Do not shave (including legs and underarms) for at least 48 hours prior to the first CHG shower.  You may shave your face/neck. Please follow these instructions carefully:  1.  Shower with CHG Soap the night before surgery and the  morning of Surgery.  2.  If you choose to wash your hair, wash your hair first as usual with your  normal  shampoo.  3.  After you shampoo, rinse your hair and body thoroughly to remove the  shampoo.  4.  Use CHG as you would any other liquid soap.  You can apply chg directly  to the skin and wash                       Gently with a scrungie or clean washcloth.  5.  Apply the CHG Soap to your body ONLY FROM THE NECK DOWN.   Do not use on face/ open                           Wound or open sores. Avoid contact with eyes, ears mouth and genitals (private parts).                       Wash face,   Genitals (private parts) with your normal soap.             6.  Wash thoroughly, paying special attention to the area where your surgery  will be performed.  7.  Thoroughly rinse your body with warm water from the neck down.  8.  DO NOT shower/wash with your normal soap after using and rinsing off  the CHG Soap.                9.  Pat yourself dry with a clean towel.            10.  Wear clean pajamas.            11.  Place clean sheets on your bed the night of your first shower and do not  sleep with pets. Day of Surgery : Do not apply any lotions/deodorants the morning of surgery.  Please wear clean clothes to the hospital/surgery center.  FAILURE TO FOLLOW THESE INSTRUCTIONS MAY RESULT IN THE CANCELLATION OF YOUR SURGERY PATIENT SIGNATURE_________________________________  NURSE SIGNATURE__________________________________  ________________________________________________________________________  WHAT IS A BLOOD TRANSFUSION? Blood Transfusion Information  A transfusion is the replacement of blood or some of its parts. Blood is made up of multiple cells which provide different functions. Red blood cells carry oxygen and are used for blood loss replacement. White blood cells fight against infection. Platelets control bleeding. Plasma helps clot blood. Other blood products are available for specialized needs, such as hemophilia or other clotting disorders. BEFORE THE TRANSFUSION  Who gives blood for transfusions?  Healthy volunteers who are fully evaluated to make sure their blood is safe. This is blood bank blood. Transfusion therapy is the safest it has ever been in the practice of medicine. Before blood is taken from a donor, a complete history is taken to make sure that person has no history of diseases nor engages in risky social behavior (examples are intravenous drug use or sexual activity with multiple partners). The donor's travel history is screened to minimize risk of transmitting  infections, such as malaria. The donated blood is tested for signs of infectious diseases, such as HIV and hepatitis. The blood is then tested to be sure it is compatible with you in order to minimize the chance of a transfusion reaction. If you or a relative donates blood, this is often done in anticipation of surgery and is not appropriate for emergency situations. It takes many days to process the donated blood. RISKS AND COMPLICATIONS Although transfusion therapy is very safe and saves many lives, the main dangers of transfusion include:  Getting an infectious disease. Developing a transfusion reaction. This is an allergic reaction to something in  the blood you were given. Every precaution is taken to prevent this. The decision to have a blood transfusion has been considered carefully by your caregiver before blood is given. Blood is not given unless the benefits outweigh the risks. AFTER THE TRANSFUSION Right after receiving a blood transfusion, you will usually feel much better and more energetic. This is especially true if your red blood cells have gotten low (anemic). The transfusion raises the level of the red blood cells which carry oxygen, and this usually causes an energy increase. The nurse administering the transfusion will monitor you carefully for complications. HOME CARE INSTRUCTIONS  No special instructions are needed after a transfusion. You may find your energy is better. Speak with your caregiver about any limitations on activity for underlying diseases you may have. SEEK MEDICAL CARE IF:  Your condition is not improving after your transfusion. You develop redness or irritation at the intravenous (IV) site. SEEK IMMEDIATE MEDICAL CARE IF:  Any of the following symptoms occur over the next 12 hours: Shaking chills. You have a temperature by mouth above 102 F (38.9 C), not controlled by medicine. Chest, back, or muscle pain. People around you feel you are not acting correctly  or are confused. Shortness of breath or difficulty breathing. Dizziness and fainting. You get a rash or develop hives. You have a decrease in urine output. Your urine turns a dark color or changes to pink, red, or brown. Any of the following symptoms occur over the next 10 days: You have a temperature by mouth above 102 F (38.9 C), not controlled by medicine. Shortness of breath. Weakness after normal activity. The white part of the eye turns yellow (jaundice). You have a decrease in the amount of urine or are urinating less often. Your urine turns a dark color or changes to pink, red, or brown. Document Released: 11/10/2000 Document Revised: 02/05/2012 Document Reviewed: 06/29/2008 [facility] Patient Information 2014 [place], [place].  _______________________________________________________________________

## 2022-06-28 ENCOUNTER — Ambulatory Visit (INDEPENDENT_AMBULATORY_CARE_PROVIDER_SITE_OTHER): Payer: Medicare Other

## 2022-06-28 ENCOUNTER — Ambulatory Visit (INDEPENDENT_AMBULATORY_CARE_PROVIDER_SITE_OTHER): Payer: Medicare Other | Admitting: Orthopaedic Surgery

## 2022-06-28 DIAGNOSIS — G8929 Other chronic pain: Secondary | ICD-10-CM

## 2022-06-28 DIAGNOSIS — M5441 Lumbago with sciatica, right side: Secondary | ICD-10-CM

## 2022-06-28 DIAGNOSIS — I25118 Atherosclerotic heart disease of native coronary artery with other forms of angina pectoris: Secondary | ICD-10-CM | POA: Diagnosis not present

## 2022-06-28 NOTE — Progress Notes (Signed)
The patient is well-known to me.  Over a year ago we had to remove hardware from her right hip and transition her into a hip replacement.  She has been diagnosed with colorectal adenocarcinoma.  She actually has surgery coming up on August 16 with Dr. Leighton Ruff as a relates to her colon cancer.  She has been dealing with right-sided low back pain that radiates down her leg for some time now.  This is slowly getting worse for her.  She has never had back surgery.  On exam she does have a positive straight leg raise on the right side.  Her right hip replacement is moving smoothly and fluidly.  She has pain in the lower aspect of her lumbar spine to the right side with flexion and extension.  There is no gross weakness of her legs.  She does not need to walk with assistive device.  X-rays of her lumbar spine show loss of her lumbar lordosis.  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  At some point she will need a MRI of her lumbar spine to assess the degree of stenosis and determine the degree of nerve compression so an intervention such as a steroid epidural could be helpful.  However we need to wait until after her colon surgery before pursuing this.  She will let us know when we can order that MRI.

## 2022-06-29 ENCOUNTER — Other Ambulatory Visit: Payer: Self-pay

## 2022-06-29 ENCOUNTER — Encounter (HOSPITAL_COMMUNITY)
Admission: RE | Admit: 2022-06-29 | Discharge: 2022-06-29 | Disposition: A | Discharge status: Home / Self Care | Payer: Medicare Other | Source: Ambulatory Visit | Attending: General Surgery | Admitting: General Surgery

## 2022-06-29 ENCOUNTER — Encounter (HOSPITAL_COMMUNITY): Payer: Self-pay

## 2022-06-29 VITALS — BP 153/78 | HR 94 | Temp 98.0°F | Ht 64.5 in | Wt 138.0 lb

## 2022-06-29 DIAGNOSIS — Z01812 Encounter for preprocedural laboratory examination: Secondary | ICD-10-CM | POA: Diagnosis not present

## 2022-06-29 DIAGNOSIS — Z01818 Encounter for other preprocedural examination: Secondary | ICD-10-CM

## 2022-06-29 DIAGNOSIS — I10 Essential (primary) hypertension: Secondary | ICD-10-CM | POA: Insufficient documentation

## 2022-06-29 HISTORY — DX: Malignant (primary) neoplasm, unspecified: C80.1

## 2022-06-29 HISTORY — DX: Palpitations: R00.2

## 2022-06-29 HISTORY — DX: Pneumonia, unspecified organism: J18.9

## 2022-06-29 LAB — CBC
HCT: 40.3 % (ref 36.0–46.0)
Hemoglobin: 13.5 g/dL (ref 12.0–15.0)
MCH: 34 pg (ref 26.0–34.0)
MCHC: 33.5 g/dL (ref 30.0–36.0)
MCV: 101.5 fL — ABNORMAL HIGH (ref 80.0–100.0)
Platelets: 157 10*3/uL (ref 150–400)
RBC: 3.97 MIL/uL (ref 3.87–5.11)
RDW: 12.8 % (ref 11.5–15.5)
WBC: 8.7 10*3/uL (ref 4.0–10.5)
nRBC: 0 % (ref 0.0–0.2)

## 2022-06-29 LAB — BASIC METABOLIC PANEL
Anion gap: 9 (ref 5–15)
BUN: 16 mg/dL (ref 8–23)
CO2: 23 mmol/L (ref 22–32)
Calcium: 9.7 mg/dL (ref 8.9–10.3)
Chloride: 107 mmol/L (ref 98–111)
Creatinine, Ser: 1.58 mg/dL — ABNORMAL HIGH (ref 0.44–1.00)
GFR, Estimated: 35 mL/min — ABNORMAL LOW (ref >60)
Glucose, Bld: 114 mg/dL — ABNORMAL HIGH (ref 70–99)
Potassium: 2.9 mmol/L — ABNORMAL LOW (ref 3.5–5.1)
Sodium: 139 mmol/L (ref 135–145)

## 2022-06-29 LAB — TYPE AND SCREEN
ABO/RH(D): O NEG
Antibody Screen: NEGATIVE

## 2022-06-29 NOTE — Progress Notes (Signed)
Pt. Was notified about the new surgical date: 07/05/22. Also arriving time will be: 6:15 AM.The ERAS will be at: 5:30 AM.Pt. verbalized her understanding of the new instructions.

## 2022-06-29 NOTE — Progress Notes (Signed)
Lab. Results: Potassium: 2.9

## 2022-06-29 NOTE — Progress Notes (Signed)
For Short Stay: [place] appointment date: Date of COVID positive in last 37 days:  Bowel Prep reminder:   For Anesthesia: PCP - Dr. Tedra Sen[place] Cardiologist - Dr. Peter Martinique.. LOV: 06/12/22  Chest x-ray - 05/03/22 EKG - 06/12/22 Stress Test -  ECHO - 05/18/22 Cardiac Cath -  Pacemaker/ICD device last checked: Pacemaker orders received: Device Rep notified:  Spinal Cord Stimulator:  Sleep Study -  CPAP -   Fasting Blood Sugar -  Checks Blood Sugar _____ times a day Date and result of last Hgb A1c-  Blood Thinner Instructions: Aspirin Instructions: It's on hold. Last Dose:  Activity level: Can go up a flight of stairs and activities of daily living without stopping and without chest pain and/or shortness of breath   Able to exercise without chest pain and/or shortness of breath   Unable to go up a flight of stairs without chest pain and/or shortness of breath     Anesthesia review: Hx: HTN,Palpitations,Afib.  Patient denies shortness of breath, fever, cough and chest pain at PAT appointment   Patient verbalized understanding of instructions that were given to them at the PAT appointment. Patient was also instructed that they will need to review over the PAT instructions again at home before surgery.

## 2022-07-05 ENCOUNTER — Other Ambulatory Visit: Payer: Self-pay

## 2022-07-05 ENCOUNTER — Inpatient Hospital Stay (HOSPITAL_COMMUNITY)
Admission: RE | Admit: 2022-07-05 | Discharge: 2022-07-09 | DRG: 331 | Disposition: A | Discharge status: Home / Self Care | Payer: Medicare Other | Attending: General Surgery | Admitting: General Surgery

## 2022-07-05 ENCOUNTER — Inpatient Hospital Stay (HOSPITAL_COMMUNITY): Payer: Medicare Other | Admitting: Physician Assistant

## 2022-07-05 ENCOUNTER — Inpatient Hospital Stay (HOSPITAL_COMMUNITY): Payer: Medicare Other | Admitting: Anesthesiology

## 2022-07-05 ENCOUNTER — Encounter (HOSPITAL_COMMUNITY): Payer: Self-pay | Admitting: General Surgery

## 2022-07-05 ENCOUNTER — Encounter (HOSPITAL_COMMUNITY)
Admission: RE | Disposition: A | Discharge status: Home / Self Care | Payer: Self-pay | Source: Home / Self Care | Attending: General Surgery

## 2022-07-05 DIAGNOSIS — C189 Malignant neoplasm of colon, unspecified: Secondary | ICD-10-CM | POA: Diagnosis not present

## 2022-07-05 DIAGNOSIS — F419 Anxiety disorder, unspecified: Secondary | ICD-10-CM | POA: Diagnosis present

## 2022-07-05 DIAGNOSIS — Z96649 Presence of unspecified artificial hip joint: Secondary | ICD-10-CM | POA: Diagnosis present

## 2022-07-05 DIAGNOSIS — I4891 Unspecified atrial fibrillation: Secondary | ICD-10-CM | POA: Diagnosis present

## 2022-07-05 DIAGNOSIS — C19 Malignant neoplasm of rectosigmoid junction: Secondary | ICD-10-CM | POA: Diagnosis present

## 2022-07-05 DIAGNOSIS — I10 Essential (primary) hypertension: Secondary | ICD-10-CM | POA: Diagnosis present

## 2022-07-05 DIAGNOSIS — C20 Malignant neoplasm of rectum: Secondary | ICD-10-CM | POA: Diagnosis not present

## 2022-07-05 DIAGNOSIS — E7800 Pure hypercholesterolemia, unspecified: Secondary | ICD-10-CM | POA: Diagnosis present

## 2022-07-05 DIAGNOSIS — E876 Hypokalemia: Secondary | ICD-10-CM | POA: Diagnosis present

## 2022-07-05 DIAGNOSIS — Z79899 Other long term (current) drug therapy: Secondary | ICD-10-CM | POA: Diagnosis not present

## 2022-07-05 DIAGNOSIS — Z8249 Family history of ischemic heart disease and other diseases of the circulatory system: Secondary | ICD-10-CM

## 2022-07-05 DIAGNOSIS — Z87891 Personal history of nicotine dependence: Secondary | ICD-10-CM | POA: Diagnosis not present

## 2022-07-05 DIAGNOSIS — Z79818 Long term (current) use of other agents affecting estrogen receptors and estrogen levels: Secondary | ICD-10-CM

## 2022-07-05 DIAGNOSIS — E039 Hypothyroidism, unspecified: Secondary | ICD-10-CM

## 2022-07-05 DIAGNOSIS — Z794 Long term (current) use of insulin: Secondary | ICD-10-CM | POA: Diagnosis not present

## 2022-07-05 HISTORY — PX: XI ROBOTIC ASSISTED LOWER ANTERIOR RESECTION: SHX6558

## 2022-07-05 LAB — POCT I-STAT, CHEM 8
BUN: 13 mg/dL (ref 8–23)
BUN: 12 mg/dL (ref 8–23)
Calcium, Ion: 1.14 mmol/L — ABNORMAL LOW (ref 1.15–1.40)
Calcium, Ion: 1.05 mmol/L — ABNORMAL LOW (ref 1.15–1.40)
Chloride: 102 mmol/L (ref 98–111)
Chloride: 105 mmol/L (ref 98–111)
Creatinine, Ser: 1.2 mg/dL — ABNORMAL HIGH (ref 0.44–1.00)
Creatinine, Ser: 1.3 mg/dL — ABNORMAL HIGH (ref 0.44–1.00)
Glucose, Bld: 195 mg/dL — ABNORMAL HIGH (ref 70–99)
Glucose, Bld: 78 mg/dL (ref 70–99)
HCT: 38 % (ref 36.0–46.0)
HCT: 35 % — ABNORMAL LOW (ref 36.0–46.0)
Hemoglobin: 12.9 g/dL (ref 12.0–15.0)
Hemoglobin: 11.9 g/dL — ABNORMAL LOW (ref 12.0–15.0)
Potassium: 2.4 mmol/L — CL (ref 3.5–5.1)
Potassium: 3.1 mmol/L — ABNORMAL LOW (ref 3.5–5.1)
Sodium: 137 mmol/L (ref 135–145)
Sodium: 136 mmol/L (ref 135–145)
TCO2: 19 mmol/L — ABNORMAL LOW (ref 22–32)
TCO2: 20 mmol/L — ABNORMAL LOW (ref 22–32)

## 2022-07-05 LAB — BASIC METABOLIC PANEL
Anion gap: 9 (ref 5–15)
BUN: 15 mg/dL (ref 8–23)
CO2: 20 mmol/L — ABNORMAL LOW (ref 22–32)
Calcium: 8.6 mg/dL — ABNORMAL LOW (ref 8.9–10.3)
Chloride: 106 mmol/L (ref 98–111)
Creatinine, Ser: 1.4 mg/dL — ABNORMAL HIGH (ref 0.44–1.00)
GFR, Estimated: 40 mL/min — ABNORMAL LOW (ref >60)
Glucose, Bld: 164 mg/dL — ABNORMAL HIGH (ref 70–99)
Potassium: 2.5 mmol/L — CL (ref 3.5–5.1)
Sodium: 135 mmol/L (ref 135–145)

## 2022-07-05 SURGERY — RESECTION, RECTUM, LOW ANTERIOR, ROBOT-ASSISTED
Anesthesia: Spinal

## 2022-07-05 MED ORDER — DOXYLAMINE SUCCINATE (SLEEP) 25 MG PO TABS
25.0000 mg | ORAL_TABLET | Freq: Every evening | ORAL | Status: DC | PRN
  Administered 2022-07-06: 25 mg via ORAL
  Administered 2022-07-06 – 2022-07-07 (×2): 50 mg via ORAL
  Administered 2022-07-07: 25 mg via ORAL
  Administered 2022-07-08: 50 mg via ORAL
  Filled 2022-07-05: qty 1
  Filled 2022-07-05 (×8): qty 2
  Filled 2022-07-05: qty 1
  Filled 2022-07-05: qty 2

## 2022-07-05 MED ORDER — PHENYLEPHRINE HCL (PRESSORS) 10 MG/ML IV SOLN
INTRAVENOUS | Status: AC
  Filled 2022-07-05: qty 1

## 2022-07-05 MED ORDER — PHENYLEPHRINE 80 MCG/ML (10ML) SYRINGE FOR IV PUSH (FOR BLOOD PRESSURE SUPPORT)
PREFILLED_SYRINGE | INTRAVENOUS | Status: AC
  Filled 2022-07-05: qty 10

## 2022-07-05 MED ORDER — KCL IN DEXTROSE-NACL 20-5-0.45 MEQ/L-%-% IV SOLN
INTRAVENOUS | Status: DC
  Filled 2022-07-05: qty 1000

## 2022-07-05 MED ORDER — LABETALOL HCL 5 MG/ML IV SOLN
INTRAVENOUS | Status: DC | PRN
  Administered 2022-07-05: 2.5 mg via INTRAVENOUS

## 2022-07-05 MED ORDER — SUGAMMADEX SODIUM 200 MG/2ML IV SOLN
INTRAVENOUS | Status: DC | PRN
  Administered 2022-07-05: 130 mg via INTRAVENOUS

## 2022-07-05 MED ORDER — MAGNESIUM OXIDE -MG SUPPLEMENT 400 (240 MG) MG PO TABS
400.0000 mg | ORAL_TABLET | Freq: Every day | ORAL | Status: DC
Start: 2022-07-05 — End: 2022-07-09
  Administered 2022-07-08: 400 mg via ORAL
  Filled 2022-07-05 (×4): qty 1

## 2022-07-05 MED ORDER — CHLORHEXIDINE GLUCONATE 0.12 % MT SOLN
15.0000 mL | Freq: Once | OROMUCOSAL | Status: AC
  Administered 2022-07-05: 15 mL via OROMUCOSAL

## 2022-07-05 MED ORDER — LACTATED RINGERS IV SOLN: INTRAVENOUS | Status: DC

## 2022-07-05 MED ORDER — SODIUM CHLORIDE 0.9 % IV SOLN
2.0000 g | INTRAVENOUS | Status: AC
  Administered 2022-07-05: 2 g via INTRAVENOUS
  Filled 2022-07-05: qty 2

## 2022-07-05 MED ORDER — ROCURONIUM BROMIDE 10 MG/ML (PF) SYRINGE
PREFILLED_SYRINGE | INTRAVENOUS | Status: DC | PRN
  Administered 2022-07-05: 80 mg via INTRAVENOUS
  Administered 2022-07-05: 20 mg via INTRAVENOUS
  Administered 2022-07-05: 10 mg via INTRAVENOUS

## 2022-07-05 MED ORDER — BUPIVACAINE LIPOSOME 1.3 % IJ SUSP
INTRAMUSCULAR | Status: DC | PRN
  Administered 2022-07-05: 20 mL

## 2022-07-05 MED ORDER — POLYVINYL ALCOHOL 1.4 % OP SOLN
1.0000 [drp] | Freq: Three times a day (TID) | OPHTHALMIC | Status: DC | PRN
  Filled 2022-07-05: qty 15

## 2022-07-05 MED ORDER — DEXAMETHASONE SODIUM PHOSPHATE 10 MG/ML IJ SOLN
INTRAMUSCULAR | Status: DC | PRN
  Administered 2022-07-05: 6 mg via INTRAVENOUS

## 2022-07-05 MED ORDER — PROPOFOL 10 MG/ML IV BOLUS
INTRAVENOUS | Status: DC | PRN
  Administered 2022-07-05: 160 mg via INTRAVENOUS

## 2022-07-05 MED ORDER — LIDOCAINE 2% (20 MG/ML) 5 ML SYRINGE
INTRAMUSCULAR | Status: DC | PRN
  Administered 2022-07-05: 80 mg via INTRAVENOUS
  Administered 2022-07-05: 1.5 mg/kg/h via INTRAVENOUS

## 2022-07-05 MED ORDER — BISACODYL 5 MG PO TBEC: 20.0000 mg | DELAYED_RELEASE_TABLET | Freq: Once | ORAL | Status: DC

## 2022-07-05 MED ORDER — EPHEDRINE SULFATE-NACL 50-0.9 MG/10ML-% IV SOSY
PREFILLED_SYRINGE | INTRAVENOUS | Status: DC | PRN
  Administered 2022-07-05: 5 mg via INTRAVENOUS

## 2022-07-05 MED ORDER — IRBESARTAN 150 MG PO TABS
150.0000 mg | ORAL_TABLET | Freq: Every day | ORAL | Status: DC
  Administered 2022-07-06 – 2022-07-09 (×3): 150 mg via ORAL
  Filled 2022-07-05 (×3): qty 1

## 2022-07-05 MED ORDER — METOPROLOL SUCCINATE ER 25 MG PO TB24
25.0000 mg | ORAL_TABLET | Freq: Every day | ORAL | Status: DC
  Administered 2022-07-06 – 2022-07-09 (×4): 25 mg via ORAL
  Filled 2022-07-05 (×4): qty 1

## 2022-07-05 MED ORDER — SACCHAROMYCES BOULARDII 250 MG PO CAPS
250.0000 mg | ORAL_CAPSULE | Freq: Two times a day (BID) | ORAL | Status: DC
  Administered 2022-07-05 – 2022-07-09 (×9): 250 mg via ORAL
  Filled 2022-07-05 (×9): qty 1

## 2022-07-05 MED ORDER — ROCURONIUM BROMIDE 10 MG/ML (PF) SYRINGE
PREFILLED_SYRINGE | INTRAVENOUS | Status: AC
Start: 2022-07-05 — End: ?
  Filled 2022-07-05: qty 10

## 2022-07-05 MED ORDER — POTASSIUM CHLORIDE 10 MEQ/100ML IV SOLN
10.0000 meq | INTRAVENOUS | Status: AC
  Administered 2022-07-05 (×3): 10 meq via INTRAVENOUS
  Filled 2022-07-05: qty 100

## 2022-07-05 MED ORDER — DROPERIDOL 2.5 MG/ML IJ SOLN
INTRAMUSCULAR | Status: DC | PRN
  Administered 2022-07-05: .625 mg via INTRAVENOUS

## 2022-07-05 MED ORDER — SIMETHICONE 80 MG PO CHEW: 40.0000 mg | CHEWABLE_TABLET | Freq: Four times a day (QID) | ORAL | Status: DC | PRN

## 2022-07-05 MED ORDER — SODIUM CHLORIDE 0.9 % IV SOLN
2.0000 g | Freq: Two times a day (BID) | INTRAVENOUS | Status: AC
  Administered 2022-07-05: 2 g via INTRAVENOUS
  Filled 2022-07-05: qty 2

## 2022-07-05 MED ORDER — BUPIVACAINE-EPINEPHRINE 0.25% -1:200000 IJ SOLN
INTRAMUSCULAR | Status: DC | PRN
  Administered 2022-07-05: 30 mL

## 2022-07-05 MED ORDER — MIDAZOLAM HCL 2 MG/2ML IJ SOLN
INTRAMUSCULAR | Status: DC | PRN
  Administered 2022-07-05: 2 mg via INTRAVENOUS

## 2022-07-05 MED ORDER — ONDANSETRON HCL 4 MG/2ML IJ SOLN: 4.0000 mg | Freq: Once | INTRAMUSCULAR | Status: DC | PRN

## 2022-07-05 MED ORDER — PHENYLEPHRINE 80 MCG/ML (10ML) SYRINGE FOR IV PUSH (FOR BLOOD PRESSURE SUPPORT)
PREFILLED_SYRINGE | INTRAVENOUS | Status: DC | PRN
  Administered 2022-07-05 (×3): 160 ug via INTRAVENOUS
  Administered 2022-07-05: 80 ug via INTRAVENOUS

## 2022-07-05 MED ORDER — FENTANYL CITRATE (PF) 250 MCG/5ML IJ SOLN
INTRAMUSCULAR | Status: AC
  Filled 2022-07-05: qty 5

## 2022-07-05 MED ORDER — LACTATED RINGERS IV SOLN: INTRAVENOUS | Status: DC | PRN

## 2022-07-05 MED ORDER — PROPOFOL 10 MG/ML IV BOLUS
INTRAVENOUS | Status: AC
Start: 2022-07-05 — End: ?
  Filled 2022-07-05: qty 20

## 2022-07-05 MED ORDER — LABETALOL HCL 5 MG/ML IV SOLN
INTRAVENOUS | Status: AC
  Filled 2022-07-05: qty 4

## 2022-07-05 MED ORDER — ALUM & MAG HYDROXIDE-SIMETH 200-200-20 MG/5ML PO SUSP: 30.0000 mL | Freq: Four times a day (QID) | ORAL | Status: DC | PRN

## 2022-07-05 MED ORDER — POTASSIUM CHLORIDE 10 MEQ/100ML IV SOLN
10.0000 meq | Freq: Once | INTRAVENOUS | Status: AC
  Administered 2022-07-05: 10 meq via INTRAVENOUS
  Filled 2022-07-05: qty 100

## 2022-07-05 MED ORDER — BUPIVACAINE LIPOSOME 1.3 % IJ SUSP
INTRAMUSCULAR | Status: AC
  Filled 2022-07-05: qty 20

## 2022-07-05 MED ORDER — ROCURONIUM BROMIDE 10 MG/ML (PF) SYRINGE
PREFILLED_SYRINGE | INTRAVENOUS | Status: AC
  Filled 2022-07-05: qty 10

## 2022-07-05 MED ORDER — BUPIVACAINE LIPOSOME 1.3 % IJ SUSP: 20.0000 mL | Freq: Once | INTRAMUSCULAR | Status: DC

## 2022-07-05 MED ORDER — MIDAZOLAM HCL 2 MG/2ML IJ SOLN
INTRAMUSCULAR | Status: AC
  Filled 2022-07-05: qty 2

## 2022-07-05 MED ORDER — ALVIMOPAN 12 MG PO CAPS
12.0000 mg | ORAL_CAPSULE | ORAL | Status: AC
  Administered 2022-07-05: 12 mg via ORAL
  Filled 2022-07-05: qty 1

## 2022-07-05 MED ORDER — POLYETHYLENE GLYCOL 3350 17 GM/SCOOP PO POWD: 1.0000 | Freq: Once | ORAL | Status: DC

## 2022-07-05 MED ORDER — ONDANSETRON HCL 4 MG PO TABS: 4.0000 mg | ORAL_TABLET | Freq: Four times a day (QID) | ORAL | Status: DC | PRN

## 2022-07-05 MED ORDER — EPHEDRINE 5 MG/ML INJ
INTRAVENOUS | Status: AC
Start: 2022-07-05 — End: ?
  Filled 2022-07-05: qty 5

## 2022-07-05 MED ORDER — ALVIMOPAN 12 MG PO CAPS: 12.0000 mg | ORAL_CAPSULE | Freq: Two times a day (BID) | ORAL | Status: DC

## 2022-07-05 MED ORDER — POTASSIUM CHLORIDE CRYS ER 20 MEQ PO TBCR
40.0000 meq | EXTENDED_RELEASE_TABLET | Freq: Once | ORAL | Status: AC
  Administered 2022-07-05: 40 meq via ORAL
  Filled 2022-07-05: qty 2

## 2022-07-05 MED ORDER — HYDROMORPHONE HCL 1 MG/ML IJ SOLN: 0.2500 mg | INTRAMUSCULAR | Status: DC | PRN

## 2022-07-05 MED ORDER — BUPIVACAINE-EPINEPHRINE (PF) 0.25% -1:200000 IJ SOLN
INTRAMUSCULAR | Status: AC
  Filled 2022-07-05: qty 30

## 2022-07-05 MED ORDER — HYDROMORPHONE HCL 1 MG/ML IJ SOLN
0.5000 mg | INTRAMUSCULAR | Status: DC | PRN
  Administered 2022-07-06 – 2022-07-08 (×7): 0.5 mg via INTRAVENOUS
  Filled 2022-07-05 (×7): qty 0.5

## 2022-07-05 MED ORDER — HEPARIN SODIUM (PORCINE) 5000 UNIT/ML IJ SOLN
5000.0000 [IU] | Freq: Once | INTRAMUSCULAR | Status: AC
  Administered 2022-07-05: 5000 [IU] via SUBCUTANEOUS
  Filled 2022-07-05: qty 1

## 2022-07-05 MED ORDER — ALPRAZOLAM 0.5 MG PO TABS
0.5000 mg | ORAL_TABLET | Freq: Two times a day (BID) | ORAL | Status: DC | PRN
Start: 2022-07-05 — End: 2022-07-09
  Administered 2022-07-05 – 2022-07-08 (×4): 0.5 mg via ORAL
  Filled 2022-07-05 (×4): qty 1

## 2022-07-05 MED ORDER — ESTRADIOL 1 MG PO TABS
2.0000 mg | ORAL_TABLET | Freq: Every day | ORAL | Status: DC
  Administered 2022-07-05 – 2022-07-08 (×4): 2 mg via ORAL
  Filled 2022-07-05 (×4): qty 2

## 2022-07-05 MED ORDER — DEXAMETHASONE SODIUM PHOSPHATE 10 MG/ML IJ SOLN
INTRAMUSCULAR | Status: AC
  Filled 2022-07-05: qty 1

## 2022-07-05 MED ORDER — ONDANSETRON HCL 4 MG/2ML IJ SOLN
INTRAMUSCULAR | Status: AC
Start: 2022-07-05 — End: ?
  Filled 2022-07-05: qty 2

## 2022-07-05 MED ORDER — LACTATED RINGERS IR SOLN
Status: DC | PRN
  Administered 2022-07-05: 1000 mL

## 2022-07-05 MED ORDER — ALBUTEROL SULFATE (2.5 MG/3ML) 0.083% IN NEBU: 2.5000 mg | INHALATION_SOLUTION | Freq: Four times a day (QID) | RESPIRATORY_TRACT | Status: DC | PRN

## 2022-07-05 MED ORDER — OXYCODONE HCL 5 MG/5ML PO SOLN: 5.0000 mg | Freq: Once | ORAL | Status: DC | PRN

## 2022-07-05 MED ORDER — ENSURE SURGERY PO LIQD
237.0000 mL | Freq: Two times a day (BID) | ORAL | Status: DC
  Administered 2022-07-05 – 2022-07-08 (×4): 237 mL via ORAL

## 2022-07-05 MED ORDER — ACETAMINOPHEN 500 MG PO TABS
1000.0000 mg | ORAL_TABLET | ORAL | Status: AC
  Administered 2022-07-05: 1000 mg via ORAL
  Filled 2022-07-05: qty 2

## 2022-07-05 MED ORDER — KETAMINE HCL 10 MG/ML IJ SOLN
INTRAMUSCULAR | Status: AC
  Filled 2022-07-05: qty 1

## 2022-07-05 MED ORDER — ALBUTEROL SULFATE HFA 108 (90 BASE) MCG/ACT IN AERS: 2.0000 | INHALATION_SPRAY | Freq: Four times a day (QID) | RESPIRATORY_TRACT | Status: DC | PRN

## 2022-07-05 MED ORDER — GABAPENTIN 300 MG PO CAPS
300.0000 mg | ORAL_CAPSULE | ORAL | Status: AC
  Administered 2022-07-05: 300 mg via ORAL
  Filled 2022-07-05: qty 1

## 2022-07-05 MED ORDER — 0.9 % SODIUM CHLORIDE (POUR BTL) OPTIME
TOPICAL | Status: DC | PRN
  Administered 2022-07-05: 3000 mL
  Administered 2022-07-05: 2000 mL

## 2022-07-05 MED ORDER — GABAPENTIN 300 MG PO CAPS
300.0000 mg | ORAL_CAPSULE | Freq: Two times a day (BID) | ORAL | Status: DC
  Administered 2022-07-05 – 2022-07-09 (×9): 300 mg via ORAL
  Filled 2022-07-05 (×9): qty 1

## 2022-07-05 MED ORDER — FENTANYL CITRATE (PF) 250 MCG/5ML IJ SOLN
INTRAMUSCULAR | Status: DC | PRN
Start: 2022-07-05 — End: 2022-07-05
  Administered 2022-07-05 (×5): 50 ug via INTRAVENOUS

## 2022-07-05 MED ORDER — ENSURE PRE-SURGERY PO LIQD: 592.0000 mL | Freq: Once | ORAL | Status: DC

## 2022-07-05 MED ORDER — KETAMINE HCL 10 MG/ML IJ SOLN
INTRAMUSCULAR | Status: DC | PRN
  Administered 2022-07-05: 30 mg via INTRAVENOUS

## 2022-07-05 MED ORDER — OXYCODONE HCL 5 MG PO TABS: 5.0000 mg | ORAL_TABLET | Freq: Once | ORAL | Status: DC | PRN

## 2022-07-05 MED ORDER — ONDANSETRON HCL 4 MG/2ML IJ SOLN: 4.0000 mg | Freq: Four times a day (QID) | INTRAMUSCULAR | Status: DC | PRN

## 2022-07-05 MED ORDER — ENOXAPARIN SODIUM 40 MG/0.4ML IJ SOSY
40.0000 mg | PREFILLED_SYRINGE | INTRAMUSCULAR | Status: DC
Start: 2022-07-06 — End: 2022-07-09
  Administered 2022-07-06 – 2022-07-09 (×4): 40 mg via SUBCUTANEOUS
  Filled 2022-07-05 (×4): qty 0.4

## 2022-07-05 MED ORDER — LIDOCAINE HCL 2 % IJ SOLN
INTRAMUSCULAR | Status: AC
Start: 2022-07-05 — End: ?
  Filled 2022-07-05: qty 20

## 2022-07-05 MED ORDER — PHENYLEPHRINE HCL-NACL 20-0.9 MG/250ML-% IV SOLN
INTRAVENOUS | Status: DC | PRN
  Administered 2022-07-05: 50 ug/min via INTRAVENOUS

## 2022-07-05 MED ORDER — ORAL CARE MOUTH RINSE: 15.0000 mL | Freq: Once | OROMUCOSAL | Status: AC

## 2022-07-05 MED ORDER — ENSURE PRE-SURGERY PO LIQD: 296.0000 mL | Freq: Once | ORAL | Status: DC

## 2022-07-05 MED ORDER — LIDOCAINE HCL (PF) 2 % IJ SOLN
INTRAMUSCULAR | Status: AC
  Filled 2022-07-05: qty 5

## 2022-07-05 SURGICAL SUPPLY — 99 items
BAG COUNTER SPONGE SURGICOUNT (BAG) ×2 IMPLANT
BAG SPNG CNTER NS LX DISP (BAG) ×1
BLADE EXTENDED COATED 6.5IN (ELECTRODE) IMPLANT
CANNULA REDUC XI 12-8 STAPL (CANNULA)
CANNULA REDUCER 12-8 DVNC XI (CANNULA) IMPLANT
CELLS DAT CNTRL 66122 CELL SVR (MISCELLANEOUS) IMPLANT
COVER SURGICAL LIGHT HANDLE (MISCELLANEOUS) ×4 IMPLANT
COVER TIP SHEARS 8 DVNC (MISCELLANEOUS) ×1 IMPLANT
COVER TIP SHEARS 8MM DA VINCI (MISCELLANEOUS) ×2
DERMABOND ×1 IMPLANT
DRAIN CHANNEL 19F RND (DRAIN) IMPLANT
DRAPE ARM DVNC X/XI (DISPOSABLE) ×4 IMPLANT
DRAPE COLUMN DVNC XI (DISPOSABLE) ×1 IMPLANT
DRAPE DA VINCI XI ARM (DISPOSABLE) ×8
DRAPE DA VINCI XI COLUMN (DISPOSABLE) ×2
DRAPE SURG IRRIG POUCH 19X23 (DRAPES) ×2 IMPLANT
DRSG OPSITE 4X5.5 SM (GAUZE/BANDAGES/DRESSINGS) ×1 IMPLANT
DRSG OPSITE POSTOP 4X10 (GAUZE/BANDAGES/DRESSINGS) IMPLANT
DRSG OPSITE POSTOP 4X6 (GAUZE/BANDAGES/DRESSINGS) IMPLANT
DRSG OPSITE POSTOP 4X8 (GAUZE/BANDAGES/DRESSINGS) IMPLANT
ELECT PENCIL ROCKER SW 15FT (MISCELLANEOUS) ×2 IMPLANT
ELECT REM PT RETURN 15FT ADLT (MISCELLANEOUS) ×2 IMPLANT
ENDOLOOP SUT PDS II  0 18 (SUTURE)
ENDOLOOP SUT PDS II 0 18 (SUTURE) IMPLANT
EVACUATOR SILICONE 100CC (DRAIN) IMPLANT
GLOVE BIO SURGEON STRL SZ 6.5 (GLOVE) ×6 IMPLANT
GLOVE BIOGEL PI IND STRL 7.0 (GLOVE) ×2 IMPLANT
GLOVE BIOGEL PI INDICATOR 7.0 (GLOVE) ×2
GLOVE INDICATOR 6.5 STRL GRN (GLOVE) ×2 IMPLANT
GOWN SRG XL LVL 4 BRTHBL STRL (GOWNS) ×1 IMPLANT
GOWN STRL NON-REIN XL LVL4 (GOWNS) ×2
GOWN STRL REUS W/ TWL XL LVL3 (GOWN DISPOSABLE) ×3 IMPLANT
GOWN STRL REUS W/TWL XL LVL3 (GOWN DISPOSABLE) ×6
GRASPER SUT TROCAR 14GX15 (MISCELLANEOUS) IMPLANT
HOLDER FOLEY CATH W/STRAP (MISCELLANEOUS) ×2 IMPLANT
IRRIG SUCT STRYKERFLOW 2 WTIP (MISCELLANEOUS) ×2
IRRIGATION SUCT STRKRFLW 2 WTP (MISCELLANEOUS) ×1 IMPLANT
KIT PROCEDURE DA VINCI SI (MISCELLANEOUS)
KIT PROCEDURE DVNC SI (MISCELLANEOUS) IMPLANT
KIT SIGMOIDOSCOPE (SET/KITS/TRAYS/PACK) ×1 IMPLANT
KIT TURNOVER KIT A (KITS) IMPLANT
NDL INSUFFLATION 14GA 120MM (NEEDLE) ×1 IMPLANT
NEEDLE INSUFFLATION 14GA 120MM (NEEDLE) ×2 IMPLANT
PACK CARDIOVASCULAR III (CUSTOM PROCEDURE TRAY) ×2 IMPLANT
PACK COLON (CUSTOM PROCEDURE TRAY) ×2 IMPLANT
PAD POSITIONING PINK XL (MISCELLANEOUS) ×2 IMPLANT
RELOAD STAPLE 60 3.5 BLU DVNC (STAPLE) IMPLANT
RELOAD STAPLE 60 4.3 GRN DVNC (STAPLE) IMPLANT
RELOAD STAPLER 3.5X60 BLU DVNC (STAPLE) IMPLANT
RELOAD STAPLER 4.3X60 GRN DVNC (STAPLE) ×1 IMPLANT
RETRACTOR WND ALEXIS 18 MED (MISCELLANEOUS) IMPLANT
RTRCTR WOUND ALEXIS 18CM MED (MISCELLANEOUS)
SCISSORS LAP 5X35 DISP (ENDOMECHANICALS) IMPLANT
SEAL CANN UNIV 5-8 DVNC XI (MISCELLANEOUS) ×3 IMPLANT
SEAL XI 5MM-8MM UNIVERSAL (MISCELLANEOUS) ×6
SEALER VESSEL DA VINCI XI (MISCELLANEOUS) ×2
SEALER VESSEL EXT DVNC XI (MISCELLANEOUS) ×1 IMPLANT
SOLUTION ELECTROLUBE (MISCELLANEOUS) ×2 IMPLANT
SPIKE FLUID TRANSFER (MISCELLANEOUS) IMPLANT
STAPLER 60 DA VINCI SURE FORM (STAPLE) ×2
STAPLER 60 SUREFORM DVNC (STAPLE) IMPLANT
STAPLER CANNULA SEAL DVNC XI (STAPLE) IMPLANT
STAPLER CANNULA SEAL XI (STAPLE)
STAPLER ECHELON POWER CIR 29 (STAPLE) ×1 IMPLANT
STAPLER ECHELON POWER CIR 31 (STAPLE) IMPLANT
STAPLER RELOAD 3.5X60 BLU DVNC (STAPLE)
STAPLER RELOAD 3.5X60 BLUE (STAPLE)
STAPLER RELOAD 4.3X60 GREEN (STAPLE) ×2
STAPLER RELOAD 4.3X60 GRN DVNC (STAPLE) ×1
STOPCOCK 4 WAY LG BORE MALE ST (IV SETS) ×4 IMPLANT
SUT ETHILON 2 0 PS N (SUTURE) IMPLANT
SUT NOVA NAB DX-16 0-1 5-0 T12 (SUTURE) ×2 IMPLANT
SUT NOVA NAB GS-21 1 T12 (SUTURE) ×2 IMPLANT
SUT PROLENE 2 0 KS (SUTURE) ×1 IMPLANT
SUT SILK 2 0 (SUTURE) ×2
SUT SILK 2 0 SH CR/8 (SUTURE) IMPLANT
SUT SILK 2-0 18XBRD TIE 12 (SUTURE) ×1 IMPLANT
SUT SILK 3 0 (SUTURE)
SUT SILK 3 0 SH CR/8 (SUTURE) ×2 IMPLANT
SUT SILK 3-0 18XBRD TIE 12 (SUTURE) IMPLANT
SUT V-LOC BARB 180 2/0GR6 GS22 (SUTURE)
SUT VIC AB 2-0 SH 18 (SUTURE) IMPLANT
SUT VIC AB 2-0 SH 27 (SUTURE) ×2
SUT VIC AB 2-0 SH 27X BRD (SUTURE) IMPLANT
SUT VIC AB 3-0 SH 18 (SUTURE) IMPLANT
SUT VIC AB 4-0 PS2 27 (SUTURE) ×4 IMPLANT
SUT VICRYL 0 UR6 27IN ABS (SUTURE) ×2 IMPLANT
SUTURE V-LC BRB 180 2/0GR6GS22 (SUTURE) IMPLANT
SYR 10ML ECCENTRIC (SYRINGE) ×2 IMPLANT
SYS LAPSCP GELPORT 120MM (MISCELLANEOUS)
SYS WOUND ALEXIS 18CM MED (MISCELLANEOUS)
SYSTEM LAPSCP GELPORT 120MM (MISCELLANEOUS) IMPLANT
SYSTEM WOUND ALEXIS 18CM MED (MISCELLANEOUS) IMPLANT
TOWEL OR 17X26 10 PK STRL BLUE (TOWEL DISPOSABLE) IMPLANT
TOWEL OR NON WOVEN STRL DISP B (DISPOSABLE) ×2 IMPLANT
TRAY FOLEY MTR SLVR 16FR STAT (SET/KITS/TRAYS/PACK) ×2 IMPLANT
TROCAR ADV FIXATION 5X100MM (TROCAR) ×2 IMPLANT
TUBING CONNECTING 10 (TUBING) ×4 IMPLANT
TUBING INSUFFLATION 10FT LAP (TUBING) ×2 IMPLANT

## 2022-07-05 NOTE — Op Note (Signed)
07/05/2022  12:58 PM  PATIENT:  Jeanne Smith  71 y.o. female  Patient Care Team: Baxley, Cresenciano Lick, MD as PCP - General [place], Peter M, MD as PCP - Cardiology (Cardiology)  PRE-OPERATIVE DIAGNOSIS:  COLORECTAL CANCER  POST-OPERATIVE DIAGNOSIS:  COLORECTAL CANCER  PROCEDURE:  Procedure(s): XI ROBOTIC ASSISTED LOWER ANTERIOR RESECTION  SURGEON:  Surgeon(s): Leighton Ruff, MD Michael Boston, MD  ASSISTANT: Dr Johney Maine   ANESTHESIA:   local and general  EBL: 38m Total I/O In: 1000 [I.V.:1000] Out: 600 [Urine:525; Blood:75]  Delay start of Pharmacological VTE agent (>24hrs) due to surgical blood loss or risk of bleeding:  no  DRAINS: none   SPECIMEN:  Source of Specimen:  Rectosigmoid colon  DISPOSITION OF SPECIMEN:  PATHOLOGY  COUNTS:  YES  PLAN OF CARE: Admit to inpatient   PATIENT DISPOSITION:  PACU - hemodynamically stable.  INDICATION:    71y.o. F with rectosigmoid mass on colonoscopy.  Biopsy showed adenocarcinoma.  I recommended segmental resection:  The anatomy & physiology of the digestive tract was discussed.  The pathophysiology was discussed.  Natural history risks without surgery was discussed.   I worked to give an overview of the disease and the frequent need to have multispecialty involvement.  I feel the risks of no intervention will lead to serious problems that outweigh the operative risks; therefore, I recommended a partial colectomy to remove the pathology.  Laparoscopic & open techniques were discussed.   Risks such as bleeding, infection, abscess, leak, reoperation, possible ostomy, hernia, heart attack, death, and other risks were discussed.  I noted a good likelihood this will help address the problem.   Goals of post-operative recovery were discussed as well.    The patient expressed understanding & wished to proceed with surgery.  OR FINDINGS:   Patient had colon cancer noted at rectosigmoid junction on colonoscopy.  Moderate adhesions noted  bilaterally and in the pelvis.  No obvious metastatic disease on visceral parietal peritoneum or liver.  The anastomosis rests ~11cm cm from the anal verge by rigid proctoscopy.  DESCRIPTION:   Informed consent was confirmed.  The patient underwent general anaesthesia without difficulty.  The patient was positioned appropriately.  VTE prevention in place.  The patient's abdomen was clipped, prepped, & draped in a sterile fashion.  Surgical timeout confirmed our plan.  The patient was positioned in reverse Trendelenburg.  Abdominal entry was gained using a Varies needle in the LUQ.  Entry was clean.  I induced carbon dioxide insufflation.  An 876mrobotic port was placed in the RUQ.  Camera inspection revealed no injury.  Extra ports were carefully placed under direct laparoscopic visualization.  I laparoscopically reflected the greater omentum and the upper abdomen the small bowel in the upper abdomen. There were significant adhesions in the pelvis and bilaterally.  Some of these were taken down laparoscopically to better accommodate her ports.  The patient was appropriately positioned and the robot was docked to the patient's left side.  Instruments were placed under direct visualization.    I mobilized the sigmoid colon off of the pelvic sidewall.  I scored the base of peritoneum of the right side of the mesentery of the left colon from the ligament of Treitz to the peritoneal reflection of the mid rectum.  The patient had significant pelvic adhesions with a loop of small bowel requiring sharp dissection off the dome of the bladder.  I elevated the sigmoid mesentery and enetered into the retro-mesenteric plane. We were able  to identify the left ureter and gonadal vessels. We kept those posterior within the retroperitoneum and elevated the left colon mesentery off that. I did isolated IMA pedicle but did not ligate it yet.  I continued distally and got into the avascular plane posterior to the  mesorectum. This allowed me to help mobilize the rectum as well by freeing the mesorectum off the sacrum.  I mobilized the peritoneal coverings towards the peritoneal reflection on both the right and left sides of the rectum.  I could see the right and left ureters and stayed away from them.    I skeletonized the inferior mesenteric artery pedicle.  I went down to its takeoff from the aorta.  After confirming the left ureter was out of the way, I went ahead and ligated the inferior mesenteric artery pedicle with bipolar robotic vessel sealer ~2cm above its takeoff from the aorta.  We ensured hemostasis. I skeletonized the mesorectum at the junction at the proximal rectum using blunt dissection & bipolar robotic vessel sealer.  I mobilized the left colon in a lateral to medial fashion off the line of Toldt up towards the splenic flexure to ensure good mobilization of the left colon to reach into the pelvis.  I divided the mesentery up to the descending sigmoid junction.  There was good perfusion of the descending colon noted.  The colon was divided distal to the tattooed area using a green load robotic stapler 60 mm.  The robot was then undocked.  The suprapubic port was enlarged into a Pfannenstiel incision and an [place] wound protector was placed.  The colon was removed from the wound.  A pursestring device was placed on the previous dissected distal descending colon.  A 2-0 Prolene was placed through the pursestring device and the colon was divided using scissors.  The specimen was then sent to pathology for further examination.  There appeared to be grossly 3 to 4 cm margin.  We then secured the Prolene suture with 3-0 silk interrupted sutures.  A 29 mm EEA anvil was placed and the pursestring was tied tightly around this.  The fat was cleared around the colon and this was then placed back into the abdomen.  An EEA stapler was introduced into the rectum and brought out through the anterior border of the  rectal stump.  An anastomosis was created without difficulty.  There was no tension noted.  There was no leak when tested with insufflation under irrigation.  Hemostasis was good.  The small bowel was inspected and cleared of adhesions using Metzenbaum scissors.  There was no sign of injury to the small bowel.  This was then placed back into the abdomen.  The abdomen was copiously irrigated with approximately 3 L of warm normal saline.  The omentum was brought down over the abdominal structures and all instruments and ports were removed.  We then switched to clean gowns, gloves, instruments and drapes.  The peritoneum of the Pfannenstiel incision was closed using a running 0 Vicryl suture.  The fascia was closed using a running #1 Novafil sutures x 2.  The subcutaneous tissue was reapproximated with 2-0 Vicryl suture and the skin was closed using a 4-0 Vicryl running subcuticular suture.  A sterile dressing was applied.  The remaining port sites were closed using interrupted 4-0 Vicryl subcuticular sutures and Dermabond.  The patient was then awakened from anesthesia and sent to the postanesthesia care unit in stable condition.  All counts were correct per operating room staff.  An MD assistant was necessary for tissue manipulation, retraction and positioning due to the complexity of the case and hospital policies.  Rosario Adie, MD  [facility]

## 2022-07-05 NOTE — Anesthesia Postprocedure Evaluation (Signed)
Anesthesia Post Note  Patient: Jeanne Smith  Procedure(s) Performed: XI ROBOTIC ASSISTED LOWER ANTERIOR RESECTION     Patient location during evaluation: PACU Anesthesia Type: General Level of consciousness: awake and alert Pain management: pain level controlled Vital Signs Assessment: post-procedure vital signs reviewed and stable Respiratory status: spontaneous breathing, nonlabored ventilation, respiratory function stable and patient connected to nasal cannula oxygen Cardiovascular status: blood pressure returned to baseline and stable Postop Assessment: no apparent nausea or vomiting Anesthetic complications: no   No notable events documented.  Last Vitals:  Vitals:   07/05/22 1427 07/05/22 1534  BP: 125/73 134/84  Pulse: 81 83  Resp: 18 20  Temp: (!) 36.3 C 36.6 C  SpO2: 96% 95%    Last Pain:  Vitals:   07/05/22 1534  TempSrc: Oral  PainSc:                  [name]

## 2022-07-05 NOTE — Progress Notes (Signed)
2.5CRITICAL RESULT PROVIDER NOTIFICATION  Test performed and critical result:  Potassium 2.5  Date and time result received:  8:42 AM 07/05/22   Provider name/title: Dr Myrtie Soman  Date and time provider notified: 07/05/22 8:43 AM  Date and time provider responded: Dr Kalman Shan 8:43 AM  Provider response: IV Potassium X3 runs recheck  K after runs

## 2022-07-05 NOTE — Anesthesia Preprocedure Evaluation (Deleted)
Anesthesia Evaluation  Patient identified by MRN, date of birth, ID band Patient awake    Reviewed: Allergy & Precautions, NPO status , Patient's Chart, lab work & pertinent test results  Airway Mallampati: II  TM Distance: >3 FB Neck ROM: Full    Dental no notable dental hx.    Pulmonary neg pulmonary ROS, former smoker,    Pulmonary exam normal breath sounds clear to auscultation       Cardiovascular hypertension, Normal cardiovascular exam+ dysrhythmias Atrial Fibrillation  Rhythm:Regular Rate:Normal     Neuro/Psych negative neurological ROS  negative psych ROS   GI/Hepatic negative GI ROS, Neg liver ROS,   Endo/Other  Hypothyroidism   Renal/GU negative Renal ROS  negative genitourinary   Musculoskeletal negative musculoskeletal ROS (+)   Abdominal   Peds negative pediatric ROS (+)  Hematology negative hematology ROS (+)   Anesthesia Other Findings   Reproductive/Obstetrics negative OB ROS                             Anesthesia Physical Anesthesia Plan  ASA: 3  Anesthesia Plan: Spinal   Post-op Pain Management: Regional block*   Induction: Intravenous  PONV Risk Score and Plan: 2 and Ondansetron, Dexamethasone, Propofol infusion and Treatment may vary due to age or medical condition  Airway Management Planned: Simple Face Mask  Additional Equipment:   Intra-op Plan:   Post-operative Plan:   Informed Consent: I have reviewed the patients History and Physical, chart, labs and discussed the procedure including the risks, benefits and alternatives for the proposed anesthesia with the patient or authorized representative who has indicated his/her understanding and acceptance.     Dental advisory given  Plan Discussed with: CRNA and Surgeon  Anesthesia Plan Comments:         Anesthesia Quick Evaluation

## 2022-07-05 NOTE — Anesthesia Procedure Notes (Signed)
Procedure Name: Intubation Date/Time: 07/05/2022 10:34 AM  Performed by: Sharlette Dense, CRNAPre-anesthesia Checklist: Patient identified, Emergency Drugs available, Suction available and Patient being monitored Patient Re-evaluated:Patient Re-evaluated prior to induction Oxygen Delivery Method: Circle system utilized Preoxygenation: Pre-oxygenation with 100% oxygen Induction Type: IV induction Ventilation: Mask ventilation without difficulty Laryngoscope Size: Miller and 2 Grade View: Grade I Tube type: Oral Tube size: 7.5 mm Number of attempts: 1 Airway Equipment and Method: Stylet Placement Confirmation: ETT inserted through vocal cords under direct vision, positive ETCO2 and breath sounds checked- equal and bilateral Secured at: 21 cm Tube secured with: Tape Dental Injury: Teeth and Oropharynx as per pre-operative assessment

## 2022-07-05 NOTE — Interval H&P Note (Signed)
History and Physical Interval Note:  07/05/2022 8:02 AM  Jeanne Smith  has presented today for surgery, with the diagnosis of COLORECTAL CANCER.  The various methods of treatment have been discussed with the patient and family. After consideration of risks, benefits and other options for treatment, the patient has consented to  Procedure(s): XI ROBOTIC ASSISTED LOWER ANTERIOR RESECTION (N/A) as a surgical intervention.  The patient's history has been reviewed, patient examined, no change in status, stable for surgery.  I have reviewed the patient's chart and labs.  Questions were answered to the patient's satisfaction.  Stress test completed.   Rosario Adie, MD  [facility]

## 2022-07-05 NOTE — Anesthesia Preprocedure Evaluation (Signed)
Anesthesia Evaluation  Patient identified by MRN, date of birth, ID band Patient awake    Reviewed: Allergy & Precautions, NPO status , Patient's Chart, lab work & pertinent test results  Airway Mallampati: II  TM Distance: >3 FB Neck ROM: Full    Dental no notable dental hx.    Pulmonary neg pulmonary ROS, former smoker,    Pulmonary exam normal breath sounds clear to auscultation       Cardiovascular hypertension, Normal cardiovascular exam+ dysrhythmias Atrial Fibrillation  Rhythm:Regular Rate:Normal     Neuro/Psych negative neurological ROS  negative psych ROS   GI/Hepatic negative GI ROS, Neg liver ROS,   Endo/Other  Hypothyroidism   Renal/GU negative Renal ROS  negative genitourinary   Musculoskeletal negative musculoskeletal ROS (+)   Abdominal   Peds negative pediatric ROS (+)  Hematology negative hematology ROS (+)   Anesthesia Other Findings   Reproductive/Obstetrics negative OB ROS                             Anesthesia Physical Anesthesia Plan  ASA: 3  Anesthesia Plan: General   Post-op Pain Management: Tylenol PO (pre-op)*   Induction: Intravenous  PONV Risk Score and Plan: 3 and Ondansetron, Dexamethasone, Droperidol and Treatment may vary due to age or medical condition  Airway Management Planned: Oral ETT  Additional Equipment:   Intra-op Plan:   Post-operative Plan: Extubation in OR  Informed Consent: I have reviewed the patients History and Physical, chart, labs and discussed the procedure including the risks, benefits and alternatives for the proposed anesthesia with the patient or authorized representative who has indicated his/her understanding and acceptance.     Dental advisory given  Plan Discussed with: CRNA and Surgeon  Anesthesia Plan Comments:         Anesthesia Quick Evaluation

## 2022-07-05 NOTE — Transfer of Care (Signed)
Immediate Anesthesia Transfer of Care Note  Patient: Jeanne Smith  Procedure(s) Performed: XI ROBOTIC ASSISTED LOWER ANTERIOR RESECTION  Patient Location: PACU  Anesthesia Type:General  Level of Consciousness: drowsy  Airway & Oxygen Therapy: Patient Spontanous Breathing and Patient connected to face mask oxygen  Post-op Assessment: Report given to RN and Post -op Vital signs reviewed and stable  Post vital signs: Reviewed and stable  Last Vitals:  Vitals Value Taken Time  BP 123/67 07/05/22 1315  Temp    Pulse 81 07/05/22 1317  Resp 13 07/05/22 1317  SpO2 100 % 07/05/22 1317  Vitals shown include unvalidated device data.  Last Pain:  Vitals:   07/05/22 0711  TempSrc:   PainSc: 0-No pain         Complications: No notable events documented.

## 2022-07-06 ENCOUNTER — Encounter (HOSPITAL_COMMUNITY): Payer: Self-pay | Admitting: General Surgery

## 2022-07-06 LAB — BASIC METABOLIC PANEL
Anion gap: 9 (ref 5–15)
BUN: 12 mg/dL (ref 8–23)
CO2: 19 mmol/L — ABNORMAL LOW (ref 22–32)
Calcium: 8 mg/dL — ABNORMAL LOW (ref 8.9–10.3)
Chloride: 106 mmol/L (ref 98–111)
Creatinine, Ser: 1.39 mg/dL — ABNORMAL HIGH (ref 0.44–1.00)
GFR, Estimated: 41 mL/min — ABNORMAL LOW (ref >60)
Glucose, Bld: 125 mg/dL — ABNORMAL HIGH (ref 70–99)
Potassium: 3 mmol/L — ABNORMAL LOW (ref 3.5–5.1)
Sodium: 134 mmol/L — ABNORMAL LOW (ref 135–145)

## 2022-07-06 LAB — CBC
HCT: 32.1 % — ABNORMAL LOW (ref 36.0–46.0)
Hemoglobin: 10.5 g/dL — ABNORMAL LOW (ref 12.0–15.0)
MCH: 33.8 pg (ref 26.0–34.0)
MCHC: 32.7 g/dL (ref 30.0–36.0)
MCV: 103.2 fL — ABNORMAL HIGH (ref 80.0–100.0)
Platelets: 123 10*3/uL — ABNORMAL LOW (ref 150–400)
RBC: 3.11 MIL/uL — ABNORMAL LOW (ref 3.87–5.11)
RDW: 13 % (ref 11.5–15.5)
WBC: 9.8 10*3/uL (ref 4.0–10.5)
nRBC: 0 % (ref 0.0–0.2)

## 2022-07-06 MED ORDER — POTASSIUM CHLORIDE CRYS ER 20 MEQ PO TBCR
40.0000 meq | EXTENDED_RELEASE_TABLET | Freq: Every day | ORAL | Status: DC
  Administered 2022-07-06 – 2022-07-09 (×4): 40 meq via ORAL
  Filled 2022-07-06 (×4): qty 2

## 2022-07-06 NOTE — Progress Notes (Signed)
1 Day Post-Op Robotic LAR Subjective: Doing well.  Walking in hall.  Tolerating clears.  Had several BM's  Objective: Vital signs in last 24 hours: Temp:  [97.4 F (36.3 C)-98.3 F (36.8 C)] 98.3 F (36.8 C) (08/10 0558) Pulse Rate:  [81-88] 85 (08/10 0558) Resp:  [13-20] 18 (08/10 0558) BP: (102-134)/(64-84) 102/66 (08/10 0558) SpO2:  [91 %-100 %] 98 % (08/10 0558) Weight:  [68.8 kg] 68.8 kg (08/10 0700)   Intake/Output from previous day: 08/09 0701 - 08/10 0700 In: 4481 [P.O.:1390; I.V.:2791; IV Piggyback:300] Out: 2575 [Urine:2500; Blood:75] Intake/Output this shift: No intake/output data recorded.   General appearance: alert and cooperative GI: soft, mildly distended  Incision: no significant drainage  Lab Results:  Recent Labs    07/05/22 1020 07/06/22 0406  WBC  --  9.8  HGB 11.9* 10.5*  HCT 35.0* 32.1*  PLT  --  123*   BMET Recent Labs    07/05/22 0815 07/05/22 1020 07/06/22 0406  NA 135 136 134*  K 2.5* 3.1* 3.0*  CL 106 105 106  CO2 20*  --  19*  GLUCOSE 164* 78 125*  BUN '15 12 12  '$ CREATININE 1.40* 1.30* 1.39*  CALCIUM 8.6*  --  8.0*   PT/INR No results for input(s): "LABPROT", "INR" in the last 72 hours. ABG No results for input(s): "PHART", "HCO3" in the last 72 hours.  Invalid input(s): "PCO2", "PO2"  MEDS, Scheduled  alvimopan  12 mg Oral BID   enoxaparin (LOVENOX) injection  40 mg Subcutaneous Q24H   estradiol  2 mg Oral QHS   feeding supplement  237 mL Oral BID BM   gabapentin  300 mg Oral BID   irbesartan  150 mg Oral Daily   magnesium oxide  400 mg Oral QHS   metoprolol succinate  25 mg Oral Daily   potassium chloride  40 mEq Oral Daily   saccharomyces boulardii  250 mg Oral BID    Studies/Results: No results found.  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   Retained orthopedic hardware 06/15/2020   Stenosing tenosynovitis of wrist 04/01/2019   Closed fracture of right wrist 01/29/2019   Pain in right wrist 01/21/2019   Pain in right hand 01/21/2019   S/P left THA, AA 05/16/2018   S/P hip replacement 05/16/2018   Knee pain 03/28/2018   Pure hypercholesterolemia 12/24/2017   Intertrochanteric fracture of right femur ([place]) 06/15/2015   Comminuted right humeral fracture 06/15/2015   HTN (hypertension) 06/15/2015    Expected post op course  Plan: d/c foley Advance diet to full liquids Hypokalemia: replace K PO and recheck in AM SL IVF's   LOS: 1 day     .Rosario Adie, MD [facility], [place]    07/06/2022 7:38 AM

## 2022-07-06 NOTE — Progress Notes (Signed)
  Transition of Care [facility]) Screening Note   Patient Details  Name: Jeanne Smith Date of Birth: 1951/04/02   Transition of Care [facility]) CM/SW Contact:    Lennart Pall, LCSW Phone Number: 07/06/2022, 12:48 PM    Transition of Care Department [facility]) has reviewed patient and no TOC needs have been identified at this time. We will continue to monitor patient advancement through interdisciplinary progression rounds. If new patient transition needs arise, please place a TOC consult.

## 2022-07-07 LAB — BASIC METABOLIC PANEL
Anion gap: 6 (ref 5–15)
BUN: 10 mg/dL (ref 8–23)
CO2: 21 mmol/L — ABNORMAL LOW (ref 22–32)
Calcium: 8.2 mg/dL — ABNORMAL LOW (ref 8.9–10.3)
Chloride: 110 mmol/L (ref 98–111)
Creatinine, Ser: 1.34 mg/dL — ABNORMAL HIGH (ref 0.44–1.00)
GFR, Estimated: 42 mL/min — ABNORMAL LOW (ref >60)
Glucose, Bld: 109 mg/dL — ABNORMAL HIGH (ref 70–99)
Potassium: 3.1 mmol/L — ABNORMAL LOW (ref 3.5–5.1)
Sodium: 137 mmol/L (ref 135–145)

## 2022-07-07 LAB — CBC
HCT: 32.7 % — ABNORMAL LOW (ref 36.0–46.0)
Hemoglobin: 10.5 g/dL — ABNORMAL LOW (ref 12.0–15.0)
MCH: 34 pg (ref 26.0–34.0)
MCHC: 32.1 g/dL (ref 30.0–36.0)
MCV: 105.8 fL — ABNORMAL HIGH (ref 80.0–100.0)
Platelets: 115 10*3/uL — ABNORMAL LOW (ref 150–400)
RBC: 3.09 MIL/uL — ABNORMAL LOW (ref 3.87–5.11)
RDW: 13.3 % (ref 11.5–15.5)
WBC: 9 10*3/uL (ref 4.0–10.5)
nRBC: 0 % (ref 0.0–0.2)

## 2022-07-07 NOTE — Progress Notes (Signed)
2 Days Post-Op Robotic LAR Subjective: Doing well.  Walking in hall.  Tolerating clears.  Had several BM's, denies nausea  Objective: Vital signs in last 24 hours: Temp:  [98.8 F (37.1 C)-99.3 F (37.4 C)] 98.8 F (37.1 C) (08/11 0628) Pulse Rate:  [86-95] 86 (08/11 0628) Resp:  [18] 18 (08/11 0628) BP: (125-144)/(83-85) 125/85 (08/11 0628) SpO2:  [95 %-99 %] 96 % (08/11 0628) Weight:  [66.1 kg] 66.1 kg (08/11 0205)   Intake/Output from previous day: 08/10 0701 - 08/11 0700 In: 1680 [P.O.:1680] Out: 3300 [Urine:3300] Intake/Output this shift: No intake/output data recorded.   General appearance: alert and cooperative GI: soft, distended  Incision: no significant drainage  Lab Results:  Recent Labs    07/06/22 0406 07/07/22 0438  WBC 9.8 9.0  HGB 10.5* 10.5*  HCT 32.1* 32.7*  PLT 123* 115*    BMET Recent Labs    07/06/22 0406 07/07/22 0438  NA 134* 137  K 3.0* 3.1*  CL 106 110  CO2 19* 21*  GLUCOSE 125* 109*  BUN 12 10  CREATININE 1.39* 1.34*  CALCIUM 8.0* 8.2*    PT/INR No results for input(s): "LABPROT", "INR" in the last 72 hours. ABG No results for input(s): "PHART", "HCO3" in the last 72 hours.  Invalid input(s): "PCO2", "PO2"  MEDS, Scheduled  enoxaparin (LOVENOX) injection  40 mg Subcutaneous Q24H   estradiol  2 mg Oral QHS   feeding supplement  237 mL Oral BID BM   gabapentin  300 mg Oral BID   irbesartan  150 mg Oral Daily   magnesium oxide  400 mg Oral QHS   metoprolol succinate  25 mg Oral Daily   potassium chloride  40 mEq Oral Daily   saccharomyces boulardii  250 mg Oral BID    Studies/Results: No results found.  Assessment: s/p Procedure(s): XI ROBOTIC ASSISTED LOWER ANTERIOR RESECTION Patient Active Problem List   Diagnosis Date Noted   Colon cancer ([place]) 07/05/2022   Colonic mass    Adenomatous polyp of transverse colon    Closed Galeazzi's fracture of left radius 12/21/2020   Status post total replacement of right  hip 09/24/2020   Retained orthopedic hardware 06/15/2020   Stenosing tenosynovitis of wrist 04/01/2019   Closed fracture of right wrist 01/29/2019   Pain in right wrist 01/21/2019   Pain in right hand 01/21/2019   S/P left THA, AA 05/16/2018   S/P hip replacement 05/16/2018   Knee pain 03/28/2018   Pure hypercholesterolemia 12/24/2017   Intertrochanteric fracture of right femur ([place]) 06/15/2015   Comminuted right humeral fracture 06/15/2015   HTN (hypertension) 06/15/2015    Expected post op course  Plan:  Hypokalemia: replace K PO and recheck in AM Just sips of clears today until pt's bloating is better   LOS: 2 days     .Rosario Adie, MD [facility], [place]    07/07/2022 7:36 AM

## 2022-07-07 NOTE — Care Management Important Message (Signed)
Important Message  Patient Details IM Letter given to the Patient. Name: Jeanne Smith MRN: [id number] Date of Birth: 1951-11-04   Medicare Important Message Given:  Yes     Kerin Salen 07/07/2022, 2:11 PM

## 2022-07-08 LAB — BASIC METABOLIC PANEL
Anion gap: 3 — ABNORMAL LOW (ref 5–15)
BUN: 11 mg/dL (ref 8–23)
CO2: 23 mmol/L (ref 22–32)
Calcium: 8.6 mg/dL — ABNORMAL LOW (ref 8.9–10.3)
Chloride: 111 mmol/L (ref 98–111)
Creatinine, Ser: 1.15 mg/dL — ABNORMAL HIGH (ref 0.44–1.00)
GFR, Estimated: 51 mL/min — ABNORMAL LOW (ref >60)
Glucose, Bld: 103 mg/dL — ABNORMAL HIGH (ref 70–99)
Potassium: 3.9 mmol/L (ref 3.5–5.1)
Sodium: 137 mmol/L (ref 135–145)

## 2022-07-08 LAB — CBC
HCT: 34.1 % — ABNORMAL LOW (ref 36.0–46.0)
Hemoglobin: 11.2 g/dL — ABNORMAL LOW (ref 12.0–15.0)
MCH: 34.6 pg — ABNORMAL HIGH (ref 26.0–34.0)
MCHC: 32.8 g/dL (ref 30.0–36.0)
MCV: 105.2 fL — ABNORMAL HIGH (ref 80.0–100.0)
Platelets: 130 10*3/uL — ABNORMAL LOW (ref 150–400)
RBC: 3.24 MIL/uL — ABNORMAL LOW (ref 3.87–5.11)
RDW: 13.2 % (ref 11.5–15.5)
WBC: 7.8 10*3/uL (ref 4.0–10.5)
nRBC: 0 % (ref 0.0–0.2)

## 2022-07-08 MED ORDER — TRAMADOL HCL 50 MG PO TABS
50.0000 mg | ORAL_TABLET | Freq: Four times a day (QID) | ORAL | Status: DC | PRN
  Administered 2022-07-08 – 2022-07-09 (×3): 100 mg via ORAL
  Filled 2022-07-08 (×2): qty 2

## 2022-07-08 NOTE — Plan of Care (Signed)
  Problem: Activity: Goal: Ability to tolerate increased activity will improve Outcome: Progressing   Problem: Nutritional: Goal: Will attain and maintain optimal nutritional status will improve Outcome: Progressing   Problem: Coping: Goal: Level of anxiety will decrease Outcome: Progressing   Problem: Pain Managment: Goal: General experience of comfort will improve Outcome: Progressing

## 2022-07-08 NOTE — Discharge Instructions (Signed)
SURGERY: POST OP INSTRUCTIONS (Surgery for small bowel obstruction, colon resection, etc)   ######################################################################  EAT Gradually transition to a high fiber diet with a fiber supplement over the next few days after discharge  WALK Walk an hour a day.  Control your pain to do that.    CONTROL PAIN Control pain so that you can walk, sleep, tolerate sneezing/coughing, go up/down stairs.  HAVE A BOWEL MOVEMENT DAILY Keep your bowels regular to avoid problems.  OK to try a laxative to override constipation.  OK to use an antidairrheal to slow down diarrhea.  Call if not better after 2 tries  CALL IF YOU HAVE PROBLEMS/CONCERNS Call if you are still struggling despite following these instructions. Call if you have concerns not answered by these instructions  ######################################################################   DIET Follow a light diet the first few days at home.  Start with a bland diet such as soups, liquids, starchy foods, low fat foods, etc.  If you feel full, bloated, or constipated, stay on a ful liquid or pureed/blenderized diet for a few days until you feel better and no longer constipated. Be sure to drink plenty of fluids every day to avoid getting dehydrated (feeling dizzy, not urinating, etc.). Gradually add a fiber supplement to your diet over the next week.  Gradually get back to a regular solid diet.  Avoid fast food or heavy meals the first week as you are more likely to get nauseated. It is expected for your digestive tract to need a few months to get back to normal.  It is common for your bowel movements and stools to be irregular.  You will have occasional bloating and cramping that should eventually fade away.  Until you are eating solid food normally, off all pain medications, and back to regular activities; your bowels will not be normal. Focus on eating a low-fat, high fiber diet the rest of your life  (See Getting to Good Bowel Health, below).  CARE of your INCISION or WOUND  It is good for closed incisions and even open wounds to be washed every day.  Shower every day.  Short baths are fine.  Wash the incisions and wounds clean with soap & water.    You may leave closed incisions open to air if it is dry.   You may cover the incision with clean gauze & replace it after your daily shower for comfort.  STAPLES: You have skin staples.  Leave them in place & set up an appointment for them to be removed by a surgery office nurse ~10 days after surgery. = 1st week of January 2024    ACTIVITIES as tolerated Start light daily activities --- self-care, walking, climbing stairs-- beginning the day after surgery.  Gradually increase activities as tolerated.  Control your pain to be active.  Stop when you are tired.  Ideally, walk several times a day, eventually an hour a day.   Most people are back to most day-to-day activities in a few weeks.  It takes 4-8 weeks to get back to unrestricted, intense activity. If you can walk 30 minutes without difficulty, it is safe to try more intense activity such as jogging, treadmill, bicycling, low-impact aerobics, swimming, etc. Save the most intensive and strenuous activity for last (Usually 4-8 weeks after surgery) such as sit-ups, heavy lifting, contact sports, etc.  Refrain from any intense heavy lifting or straining until you are off narcotics for pain control.  You will have off days, but things should improve   week-by-week. DO NOT PUSH THROUGH PAIN.  Let pain be your guide: If it hurts to do something, don't do it.  Pain is your body warning you to avoid that activity for another week until the pain goes down. You may drive when you are no longer taking narcotic prescription pain medication, you can comfortably wear a seatbelt, and you can safely make sudden turns/stops to protect yourself without hesitating due to pain. You may have sexual intercourse when it  is comfortable. If it hurts to do something, stop.  MEDICATIONS Take your usually prescribed home medications unless otherwise directed.   Blood thinners:  Usually you can restart any strong blood thinners after the second postoperative day.  It is OK to take aspirin right away.     If you are on strong blood thinners (warfarin/Coumadin, Plavix, Xerelto, Eliquis, Pradaxa, etc), discuss with your surgeon, medicine PCP, and/or cardiologist for instructions on when to restart the blood thinner & if blood monitoring is needed (PT/INR blood check, etc).     PAIN CONTROL Pain after surgery or related to activity is often due to strain/injury to muscle, tendon, nerves and/or incisions.  This pain is usually short-term and will improve in a few months.  To help speed the process of healing and to get back to regular activity more quickly, DO THE FOLLOWING THINGS TOGETHER: Increase activity gradually.  DO NOT PUSH THROUGH PAIN Use Ice and/or Heat Try Gentle Massage and/or Stretching Take over the counter pain medication Take Narcotic prescription pain medication for more severe pain  Good pain control = faster recovery.  It is better to take more medicine to be more active than to stay in bed all day to avoid medications.  Increase activity gradually Avoid heavy lifting at first, then increase to lifting as tolerated over the next 6 weeks. Do not "push through" the pain.  Listen to your body and avoid positions and maneuvers than reproduce the pain.  Wait a few days before trying something more intense Walking an hour a day is encouraged to help your body recover faster and more safely.  Start slowly and stop when getting sore.  If you can walk 30 minutes without stopping or pain, you can try more intense activity (running, jogging, aerobics, cycling, swimming, treadmill, sex, sports, weightlifting, etc.) Remember: If it hurts to do it, then don't do it! Use Ice and/or Heat You will have swelling and  bruising around the incisions.  This will take several weeks to resolve. Ice packs or heating pads (6-8 times a day, 30-60 minutes at a time) will help sooth soreness & bruising. Some people prefer to use ice alone, heat alone, or alternate between ice & heat.  Experiment and see what works best for you.  Consider trying ice for the first few days to help decrease swelling and bruising; then, switch to heat to help relax sore spots and speed recovery. Shower every day.  Short baths are fine.  It feels good!  Keep the incisions and wounds clean with soap & water.   Try Gentle Massage and/or Stretching Massage at the area of pain many times a day Stop if you feel pain - do not overdo it Take over the counter pain medication This helps the muscle and nerve tissues become less irritable and calm down faster Choose ONE of the following over-the-counter anti-inflammatory medications: Acetaminophen 500mg tabs (Tylenol) 1-2 pills with every meal and just before bedtime (avoid if you have liver problems or if you have   acetaminophen in you narcotic prescription) Naproxen 220mg tabs (ex. Aleve, Naprosyn) 1-2 pills twice a day (avoid if you have kidney, stomach, IBD, or bleeding problems) Ibuprofen 200mg tabs (ex. Advil, Motrin) 3-4 pills with every meal and just before bedtime (avoid if you have kidney, stomach, IBD, or bleeding problems) Take with food/snack several times a day as directed for at least 2 weeks to help keep pain / soreness down & more manageable. Take Narcotic prescription pain medication for more severe pain A prescription for strong pain control is often given to you upon discharge (for example: oxycodone/Percocet, hydrocodone/Norco/Vicodin, or tramadol/Ultram) Take your pain medication as prescribed. Be mindful that most narcotic prescriptions contain Tylenol (acetaminophen) as well - avoid taking too much Tylenol. If you are having problems/concerns with the prescription medicine (does  not control pain, nausea, vomiting, rash, itching, etc.), please call us [phone] to see if we need to switch you to a different pain medicine that will work better for you and/or control your side effects better. If you need a refill on your pain medication, you must call the office before 4 pm and on weekdays only.  By federal law, prescriptions for narcotics cannot be called into a pharmacy.  They must be filled out on paper & picked up from our office by the patient or authorized caretaker.  Prescriptions cannot be filled after 4 pm nor on weekends.    WHEN TO CALL US [phone] Severe uncontrolled or worsening pain  Fever over 101 F (38.5 C) Concerns with the incision: Worsening pain, redness, rash/hives, swelling, bleeding, or drainage Reactions / problems with new medications (itching, rash, hives, nausea, etc.) Nausea and/or vomiting Difficulty urinating Difficulty breathing Worsening fatigue, dizziness, lightheadedness, blurred vision Other concerns If you are not getting better after two weeks or are noticing you are getting worse, contact our office [phone] for further advice.  We may need to adjust your medications, re-evaluate you in the office, send you to the emergency room, or see what other things we can do to help. The clinic staff is available to answer your questions during regular business hours (8:30am-5pm).  Please don't hesitate to call and ask to speak to one of our nurses for clinical concerns.    A surgeon from [facility] is always on call at the hospitals 24 hours/day If you have a medical emergency, go to the nearest emergency room or call 911.  FOLLOW UP in our office One the day of your discharge from the hospital (or the next business weekday), please call [facility] to set up or confirm an appointment to see your surgeon in the office for a follow-up appointment.  Usually it is 2-3 weeks after your surgery.   If you  have skin staples at your incision(s), let the office know so we can set up a time in the office for the nurse to remove them (usually around 10 days after surgery). Make sure that you call for appointments the day of discharge (or the next business weekday) from the hospital to ensure a convenient appointment time. IF YOU HAVE DISABILITY OR FAMILY LEAVE FORMS, BRING THEM TO THE OFFICE FOR PROCESSING.  DO NOT GIVE THEM TO YOUR DOCTOR.  [facility] [address], [address], , [place]  [address] ? [phone] - Main [phone] - Toll Free,  [phone] - Fax [email or web address]    GETTING TO GOOD BOWEL HEALTH. It is expected for your digestive tract to   need a few months to get back to normal.  It is common for your bowel movements and stools to be irregular.  You will have occasional bloating and cramping that should eventually fade away.  Until you are eating solid food normally, off all pain medications, and back to regular activities; your bowels will not be normal.   Avoiding constipation The goal: ONE SOFT BOWEL MOVEMENT A DAY!    Drink plenty of fluids.  Choose water first. TAKE A FIBER SUPPLEMENT EVERY DAY THE REST OF YOUR LIFE During your first week back home, gradually add back a fiber supplement every day Experiment which form you can tolerate.   There are many forms such as powders, tablets, wafers, gummies, etc Psyllium bran (Metamucil), methylcellulose (Citrucel), Miralax or Glycolax, Benefiber, Flax Seed.  Adjust the dose week-by-week (1/2 dose/day to 6 doses a day) until you are moving your bowels 1-2 times a day.  Cut back the dose or try a different fiber product if it is giving you problems such as diarrhea or bloating. Sometimes a laxative is needed to help jump-start bowels if constipated until the fiber supplement can help regulate your bowels.  If you are tolerating eating & you are farting, it is okay to try a gentle  laxative such as double dose MiraLax, prune juice, or Milk of Magnesia.  Avoid using laxatives too often. Stool softeners can sometimes help counteract the constipating effects of narcotic pain medicines.  It can also cause diarrhea, so avoid using for too long. If you are still constipated despite taking fiber daily, eating solids, and a few doses of laxatives, call our office. Controlling diarrhea Try drinking liquids and eating bland foods for a few days to avoid stressing your intestines further. Avoid dairy products (especially milk & ice cream) for a short time.  The intestines often can lose the ability to digest lactose when stressed. Avoid foods that cause gassiness or bloating.  Typical foods include beans and other legumes, cabbage, broccoli, and dairy foods.  Avoid greasy, spicy, fast foods.  Every person has some sensitivity to other foods, so listen to your body and avoid those foods that trigger problems for you. Probiotics (such as active yogurt, Align, etc) may help repopulate the intestines and colon with normal bacteria and calm down a sensitive digestive tract Adding a fiber supplement gradually can help thicken stools by absorbing excess fluid and retrain the intestines to act more normally.  Slowly increase the dose over a few weeks.  Too much fiber too soon can backfire and cause cramping & bloating. It is okay to try and slow down diarrhea with a few doses of antidiarrheal medicines.   Bismuth subsalicylate (ex. Kayopectate, Pepto Bismol) for a few doses can help control diarrhea.  Avoid if pregnant.   Loperamide (Imodium) can slow down diarrhea.  Start with one tablet (2mg) first.  Avoid if you are having fevers or severe pain.  ILEOSTOMY PATIENTS WILL HAVE CHRONIC DIARRHEA since their colon is not in use.    Drink plenty of liquids.  You will need to drink even more glasses of water/liquid a day to avoid getting dehydrated. Record output from your ileostomy.  Expect to empty  the bag every 3-4 hours at first.  Most people with a permanent ileostomy empty their bag 4-6 times at the least.   Use antidiarrheal medicine (especially Imodium) several times a day to avoid getting dehydrated.  Start with a dose at bedtime & breakfast.  Adjust up or   down as needed.  Increase antidiarrheal medications as directed to avoid emptying the bag more than 8 times a day (every 3 hours). Work with your wound ostomy nurse to learn care for your ostomy.  See ostomy care instructions. TROUBLESHOOTING IRREGULAR BOWELS 1) Start with a soft & bland diet. No spicy, greasy, or fried foods.  2) Avoid gluten/wheat or dairy products from diet to see if symptoms improve. 3) Miralax 17gm or flax seed mixed in 8oz. water or juice-daily. May use 2-4 times a day as needed. 4) Gas-X, Phazyme, etc. as needed for gas & bloating.  5) Prilosec (omeprazole) over-the-counter as needed 6)  Consider probiotics (Align, Activa, etc) to help calm the bowels down  Call your doctor if you are getting worse or not getting better.  Sometimes further testing (cultures, endoscopy, X-ray studies, CT scans, bloodwork, etc.) may be needed to help diagnose and treat the cause of the diarrhea. [facility] [address], [address], [place], [place]  [address] [phone] - Main.    [phone]  - Toll Free.   [phone] - Fax [email or web address]   ###############################   #######################################################  Ostomy Support Information  You've heard that people get along just fine with only one of their eyes, or one of their lungs, or one of their kidneys. But you also know that you have only one intestine and only one bladder, and that leaves you feeling awfully empty, both physically and emotionally: You think no other people go around without part of their intestine with the ends of their intestines sticking out through their abdominal walls.    YOU ARE NOT ALONE.  There are nearly three quarters of a million people in the [place] who have an ostomy; people who have had surgery to remove all or part of their colons or bladders.   There is even a national association, the [employer] with over 350 local affiliated support groups that are organized by volunteers who provide peer support and counseling. [employer] has a toll free telephone num-ber, 800-826-0826 and an educational, interactive website, www.ostomy.org   An ostomy is an opening in the belly (abdominal wall) made by surgery. Ostomates are people who have had this procedure. The opening (stoma) allows the kidney or bowel to grdischarge waste. An external pouch covers the stoma to collect waste. Pouches are are a simple bag and are odor free. Different companies have disposable or reusable pouches to fit one's lifestyle. An ostomy can either be temporary or permanent.   THERE ARE THREE MAIN TYPES OF OSTOMIES Colostomy. A colostomy is a surgically created opening in the large intestine (colon). Ileostomy. An ileostomy is a surgically created opening in the small intestine. Urostomy. A urostomy is a surgically created opening to divert urine away from the bladder.  OSTOMY Care  The following guidelines will make care of your colostomy easier. Keep this information close by for quick reference.  Helpful DIET hints Eat a well-balanced diet including vegetables and fresh fruits. Eat on a regular schedule.  Drink at least 6 to 8 glasses of fluids daily. Eat slowly in a relaxed atmosphere. Chew your food thoroughly. Avoid chewing gum, smoking, and drinking from a straw. This will help decrease the amount of air you swallow, which may help reduce gas. Eating yogurt or drinking buttermilk may help reduce gas.  To control gas at night, do not eat after 8 p.m. This will give your bowel time to quiet down before you go   to bed.  If gas is a problem, you can purchase  Beano. Sprinkle Beano on the first bite of food before eating to reduce gas. It has no flavor and should not change the taste of your food. You can buy Beano over the counter at your local drugstore.  Foods like fish, onions, garlic, broccoli, asparagus, and cabbage produce odor. Although your pouch is odor-proof, if you eat these foods you may notice a stronger odor when emptying your pouch. If this is a concern, you may want to limit these foods in your diet.  If you have an ileostomy, you will have chronic diarrhea & need to drink more liquids to avoid getting dehydrated.  Consider antidiarrheal medicine like imodium (loperamide) or Lomotil to help slow down bowel movements / diarrhea into your ileostomy bag.  GETTING TO GOOD BOWEL HEALTH WITH AN ILEOSTOMY    With the colon bypassed & not in use, you will have small bowel diarrhea.   It is important to thicken & slow your bowel movements down.   The goal: 4-6 small BOWEL MOVEMENTS A DAY It is important to drink plenty of liquids to avoid getting dehydrated  CONTROLLING ILEOSTOMY DIARRHEA  TAKE A FIBER SUPPLEMENT (FiberCon or Benefiner soluble fiber) twice a day - to thicken stools by absorbing excess fluid and retrain the intestines to act more normally.  Slowly increase the dose over a few weeks.  Too much fiber too soon can backfire and cause cramping & bloating.  TAKE AN IRON SUPPLEMENT twice a day to naturally constipate your bowels.  Usually ferrous sulfate 325mg twice a day)  TAKE ANTI-DIARRHEAL MEDICINES: Loperamide (Imodium) can slow down diarrhea.  Start with two tablets (= 4mg) first and then try one tablet every 6 hours.  Can go up to 2 pills four times day (8 pills of 2mg max) Avoid if you are having fevers or severe pain.  If you are not better or start feeling worse, stop all medicines and call your doctor for advice LoMotil (Diphenoxylate / Atropine) is another medicine that can constipate & slow down bowel moevements Pepto  Bismol (bismuth) can gently thicken bowels as well  If diarrhea is worse,: drink plenty of liquids and try simpler foods for a few days to avoid stressing your intestines further. Avoid dairy products (especially milk & ice cream) for a short time.  The intestines often can lose the ability to digest lactose when stressed. Avoid foods that cause gassiness or bloating.  Typical foods include beans and other legumes, cabbage, broccoli, and dairy foods.  Every person has some sensitivity to other foods, so listen to our body and avoid those foods that trigger problems for you.Call your doctor if you are getting worse or not better.  Sometimes further testing (cultures, endoscopy, X-ray studies, bloodwork, etc) may be needed to help diagnose and treat the cause of the diarrhea. Take extra anti-diarrheal medicines (maximum is 8 pills of 2mg loperamide a day)   Tips for POUCHING an OSTOMY   Changing Your Pouch The best time to change your pouch is in the morning, before eating or drinking anything. Your stoma can function at any time, but it will function more after eating or drinking.   Applying the pouching system  Place all your equipment close at hand before removing your pouch.  Wash your hands.  Stand or sit in front of a mirror. Use the position that works best for you. Remember that you must keep the skin around the stoma   wrinkle-free for a good seal.  Gently remove the used pouch (1-piece system) or the pouch and old wafer (2-piece system). Empty the pouch into the toilet. Save the closure clip to use again.  Wash the stoma itself and the skin around the stoma. Your stoma may bleed a little when being washed. This is normal. Rinse and pat dry. You may use a wash cloth or soft paper towels (like Bounty), mild soap (like Dial, Safeguard, or Ivory), and water. Avoid soaps that contain perfumes or lotions.  For a new pouch (1-piece system) or a new wafer (2-piece system), measure your  stoma using the stoma guide in each box of supplies.  Trace the shape of your stoma onto the back of the new pouch or the back of the new wafer. Cut out the opening. Remove the paper backing and set it aside.  Optional: Apply a skin barrier powder to surrounding skin if it is irritated (bare or weeping), and dust off the excess. Optional: Apply a skin-prep wipe (such as Skin Prep or All-Kare) to the skin around the stoma, and let it dry. Do not apply this solution if the skin is irritated (red, tender, or broken) or if you have shaved around the stoma. Optional: Apply a skin barrier paste (such as Stomahesive, Coloplast, or Premium) around the opening cut in the back of the pouch or wafer. Allow it to dry for 30 to 60 seconds.  Hold the pouch (1-piece system) or wafer (2-piece system) with the sticky side toward your body. Make sure the skin around the stoma is wrinkle-free. Center the opening on the stoma, then press firmly to your abdomen (Fig. 4). Look in the mirror to check if you are placing the pouch, or wafer, in the right position. For a 2-piece system, snap the pouch onto the wafer. Make sure it snaps into place securely.  Place your hand over the stoma and the pouch or wafer for about 30 seconds. The heat from your hand can help the pouch or wafer stick to your skin.  Add deodorant (such as Super Banish or Nullo) to your pouch. Other options include food extracts such as vanilla oil and peppermint extract. Add about 10 drops of the deodorant to the pouch. Then apply the closure clamp. Note: Do not use toxic  chemicals or commercial cleaning agents in your pouch. These substances may harm the stoma.  Optional: For extra seal, apply tape to all 4 sides around the pouch or wafer, as if you were framing a picture. You may use any brand of medical adhesive tape. Change your pouch every 5 to 7 days. Change it immediately if a leak occurs.  Wash your hands afterwards.  If you are wearing a  2-piece system, you may use 2 new pouches per week and alternate them. Rinse the pouch with mild soap and warm water and hang it to dry for the next day. Apply the fresh pouch. Alternate the 2 pouches like this for a week. After a week, change the wafer and begin with 2 new pouches. Place the old pouches in a plastic bag, and put them in the trash.   LIVING WITH AN OSTOMY  Emptying Your Pouch Empty your pouch when it is one-third full (of urine, stool, and/or gas). If you wait until your pouch is fuller than this, it will be more difficult to empty and more noticeable. When you empty your pouch, either put toilet paper in the toilet bowl first, or flush the   toilet while you empty the pouch. This will reduce splashing. You can empty the pouch between your legs or to one side while sitting, or while standing or stooping. If you have a 2-piece system, you can snap off the pouch to empty it. Remember that your stoma may function during this time. If you wish to rinse your pouch after you empty it, a turkey baster can be helpful. When using a baster, squirt water up into the pouch through the opening at the bottom. With a 2-piece system, you can snap off the pouch to rinse it. After rinsing  your pouch, empty it into the toilet. When rinsing your pouch at home, put a few granules of Dreft soap in the rinse water. This helps lubricate and freshen your pouch. The inside of your pouch can be sprayed with non-stick cooking oil (Pam spray). This may help reduce stool sticking to the inside of the pouch.  Bathing You may shower or bathe with your pouch on or off. Remember that your stoma may function during this time.  The materials you use to wash your stoma and the skin around it should be clean, but they do not need to be sterile.  Wearing Your Pouch During hot weather, or if you perspire a lot in general, wear a cover over your pouch. This may prevent a rash on your skin under the pouch. Pouch covers are  sold at ostomy supply stores. Wear the pouch inside your underwear for better support. Watch your weight. Any gain or loss of 10 to 15 pounds or more can change the way your pouch fits.  Going Away From Home A collapsible cup (like those that come in travel kits) or a soft plastic squirt bottle with a pull-up top (like a travel bottle for shampoo) can be used for rinsing your pouch when you are away from home. Tilt the opening of the pouch at an upward angle when using a cup to rinse.  Carry wet wipes or extra tissues to use in public bathrooms.  Carry an extra pouching system with you at all times.  Never keep ostomy supplies in the glove compartment of your car. Extreme heat or cold can damage the skin barriers and adhesive wafers on the pouch.  When you travel, carry your ostomy supplies with you at all times. Keep them within easy reach. Do not pack ostomy supplies in baggage that will be checked or otherwise separated from you, because your baggage might be lost. If you're traveling out of the country, it is helpful to have a letter stating that you are carrying ostomy supplies as a medical necessity.  If you need ostomy supplies while traveling, look in the yellow pages of the telephone book under "Surgical Supplies." Or call the local ostomy organization to find out where supplies are available.  Do not let your ostomy supplies get low. Always order new pouches before you use the last one.  Reducing Odor Limit foods such as broccoli, cabbage, onions, fish, and garlic in your diet to help reduce odor. Each time you empty your pouch, carefully clean the opening of the pouch, both inside and outside, with toilet paper. Rinse your pouch 1 or 2 times daily after you empty it (see directions for emptying your pouch and going away from home). Add deodorant (such as Super Banish or Nullo) to your pouch. Use air deodorizers in your bathroom. Do not add aspirin to your pouch. Even though  aspirin can help prevent odor, it   could cause ulcers on your stoma.  When to call the doctor Call the doctor if you have any of the following symptoms: Purple, black, or white stoma Severe cramps lasting more than 6 hours Severe watery discharge from the stoma lasting more than 6 hours No output from the colostomy for 3 days Excessive bleeding from your stoma Swelling of your stoma to more than 1/2-inch larger than usual Pulling inward of your stoma below skin level Severe skin irritation or deep ulcers Bulging or other changes in your abdomen  When to call your ostomy nurse Call your ostomy/enterostomal therapy (WOCN) nurse if any of the following occurs: Frequent leaking of your pouching system Change in size or appearance of your stoma, causing discomfort or problems with your pouch Skin rash or rawness Weight gain or loss that causes problems with your pouch     FREQUENTLY ASKED QUESTIONS   Why haven't you met any of these folks who have an ostomy?  Well, maybe you have! You just did not recognize them because an ostomy doesn't show. It can be kept secret if you wish. Why, maybe some of your best friends, office associates or neighbors have an ostomy ... you never can tell. People facing ostomy surgery have many quality-of-life questions like: Will you bulge? Smell? Make noises? Will you feel waste leaving your body? Will you be a captive of the toilet? Will you starve? Be a social outcast? Get/stay married? Have babies? Easily bathe, go swimming, bend over?  OK, let's look at what you can expect:   Will you bulge?  Remember, without part of the intestine or bladder, and its contents, you should have a flatter tummy than before. You can expect to wear, with little exception, what you wore before surgery ... and this in-cludes tight clothing and bathing suits.   Will you smell?  Today, thanks to modern odor proof pouching systems, you can walk into an ostomy support group  meeting and not smell anything that is foul or offensive. And, for those with an ileostomy or colostomy who are concerned about odor when emptying their pouch, there are in-pouch deodorants that can be used to eliminate any waste odors that may exist.   Will you make noises?  Everyone produces gas, especially if they are an air-swallower. But intestinal sounds that occur from time to time are no differ-ent than a gurgling tummy, and quite often your clothing will muffle any sounds.   Will you feel the waste discharges?  For those with a colostomy or ileostomy there might be a slight pressure when waste leaves your body, but understand that the intestines have no nerve endings, so there will be no unpleasant sensations. Those with a urostomy will probably be unaware of any kidney drainage.   Will you be a captive of the toilet?  Immediately post-op you will spend more time in the bathroom than you will after your body recovers from surgery. Every person is different, but on average those with an ileostomy or urostomy may empty their pouches 4 to 6 times a day; a little  less if you have a colostomy. The average wear time between pouch system changes is 3 to 5 days and the changing process should take less than 30 minutes.   Will I need to be on a special diet? Most people return to their normal diet when they have recovered from surgery. Be sure to chew your food well, eat a well-balanced diet and drink plenty of fluids. If   you experience problems with a certain food, wait a couple of weeks and try it again.  Will there be odor and noises? Pouching systems are designed to be odor-proof or odor-resistant. There are deodorants that can be used in the pouch. Medications are also available to help reduce odor. Limit gas-producing foods and carbonated beverages. You will experience less gas and fewer noises as you heal from surgery.  How much time will it take to care for my ostomy? At first, you may  spend a lot of time learning about your ostomy and how to take care of it. As you become more comfortable and skilled at changing the pouching system, it will take very little time to care for it.   Will I be able to return to work? People with ostomies can perform most jobs. As soon as you have healed from surgery, you should be able to return to work. Heavy lifting (more than 10 pounds) may be discouraged.   What about intimacy? Sexual relationships and intimacy are important and fulfilling aspects of your life. They should continue after ostomy surgery. Intimacy-related concerns should be discussed openly between you and your partner.   Can I wear regular clothing? You do not need to wear special clothing. Ostomy pouches are fairly flat and barely noticeable. Elastic undergarments will not hurt the stoma or prevent the ostomy from functioning.   Can I participate in sports? An ostomy should not limit your involvement in sports. Many people with ostomies are runners, skiers, swimmers or participate in other active lifestyles. Talk with your caregiver first before doing heavy physical activity.  Will you starve?  Not if you follow doctor's orders at each stage of your post-op adjustment. There is no such thing as an "ostomy diet". Some people with an ostomy will be able to eat and tolerate anything; others may find diffi-culty with some foods. Each person is an individual and must determine, by trial, what is best for them. A good practice for all is to drink plenty of water.   Will you be a social outcast?  Have you met anyone who has an ostomy and is a social outcast? Why should you be the first? Only your attitude and self image will effect how you are treated. No confi-dent person is an outcast.    PROFESSIONAL HELP   Resources are available if you need help or have questions about your ostomy.   Specially trained nurses called Wound, Ostomy Continence Nurses (WOCN) are available for  consultation in most major medical centers.  Consider getting an ostomy consult at an outpatient ostomy clinic.   [facility] has an Ostomy Clinic run by an WOCN ostomy nurse at the [facility] campus.  [phone]. [facility] can help set up an appointment   The United Ostomy Association (UOA) is a group made up of many local chapters throughout the [place]. These local groups hold meetings and provide support to prospective and existing ostomates. They sponsor educational events and have qualified visitors to make personal or telephone visits. Contact the UOA for the chapter nearest you and for other educational publications.  More detailed information can be found in Colostomy Guide, a publication of the United Ostomy Association (UOA). Contact UOA at 1-800-826-0826 or visit their web site at www.uoaa.org. The website contains links to other sites, suppliers and resources.  [facility] Secure Start Services: Start at the website to enlist for support.  Your Wound Ostomy (WOCN) nurse may have started this   process. https://[email or web address].[employer].com/en/securestart Secure Start services are designed to support people as they live their lives with an ostomy or neurogenic bladder. Enrolling is easy and at no cost to the patient. We realize that each person's needs and life journey are different. Through Secure Start services, we want to help people live their life, their way.  #######################################################  

## 2022-07-08 NOTE — Progress Notes (Signed)
3 Days Post-Op Robotic LAR Subjective: Doing well.  Walking in hall.  Tolerating clears.  Passing flatus, denies nausea  Objective: Vital signs in last 24 hours: Temp:  [97.8 F (36.6 C)-98.9 F (37.2 C)] 97.8 F (36.6 C) (08/12 0557) Pulse Rate:  [79-95] 79 (08/12 0557) Resp:  [16-18] 18 (08/12 0557) BP: (116-145)/(78-90) 116/78 (08/12 0557) SpO2:  [97 %-98 %] 97 % (08/12 0557) Weight:  [67.1 kg] 67.1 kg (08/12 0500)   Intake/Output from previous day: 08/11 0701 - 08/12 0700 In: 1440 [P.O.:1440] Out: 3125 [PIRJJ:8841] Intake/Output this shift: No intake/output data recorded.   General appearance: alert and cooperative GI: soft, less distended  Incision: no significant drainage  Lab Results:  Recent Labs    07/07/22 0438 07/08/22 0425  WBC 9.0 7.8  HGB 10.5* 11.2*  HCT 32.7* 34.1*  PLT 115* 130*    BMET Recent Labs    07/07/22 0438 07/08/22 0425  NA 137 137  K 3.1* 3.9  CL 110 111  CO2 21* 23  GLUCOSE 109* 103*  BUN 10 11  CREATININE 1.34* 1.15*  CALCIUM 8.2* 8.6*    PT/INR No results for input(s): "LABPROT", "INR" in the last 72 hours. ABG No results for input(s): "PHART", "HCO3" in the last 72 hours.  Invalid input(s): "PCO2", "PO2"  MEDS, Scheduled  enoxaparin (LOVENOX) injection  40 mg Subcutaneous Q24H   estradiol  2 mg Oral QHS   feeding supplement  237 mL Oral BID BM   gabapentin  300 mg Oral BID   irbesartan  150 mg Oral Daily   magnesium oxide  400 mg Oral QHS   metoprolol succinate  25 mg Oral Daily   potassium chloride  40 mEq Oral Daily   saccharomyces boulardii  250 mg Oral BID    Studies/Results: No results found.  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

## 2022-07-09 MED ORDER — TRAMADOL HCL 50 MG PO TABS: 50.0000 mg | ORAL_TABLET | Freq: Four times a day (QID) | ORAL | 0 refills | Status: DC | PRN

## 2022-07-09 MED ORDER — POTASSIUM CHLORIDE CRYS ER 20 MEQ PO TBCR
20.0000 meq | EXTENDED_RELEASE_TABLET | Freq: Two times a day (BID) | ORAL | 0 refills | Status: DC
  Filled 2022-07-09: qty 20, 10d supply, fill #0

## 2022-07-09 MED ORDER — TRAMADOL HCL 50 MG PO TABS
50.0000 mg | ORAL_TABLET | Freq: Four times a day (QID) | ORAL | 0 refills | Status: DC | PRN
  Filled 2022-07-09: qty 30, 4d supply, fill #0

## 2022-07-09 MED ORDER — POTASSIUM CHLORIDE CRYS ER 20 MEQ PO TBCR
20.0000 meq | EXTENDED_RELEASE_TABLET | Freq: Two times a day (BID) | ORAL | 0 refills | Status: DC
Start: 2022-07-09 — End: 2022-07-28

## 2022-07-09 NOTE — Discharge Summary (Signed)
Physician Discharge Summary  Patient ID: ONYINYECHI HUANTE MRN: [id number] DOB/AGE: [birth year] 71 y.o.  Admit date: 07/05/2022 Discharge date: 07/09/2022  Admission Diagnoses: Colon cancer  Discharge Diagnoses:  Principal Problem:   Colon cancer [facility])   Discharged Condition: good  Hospital Course: Patient was admitted to the med surg floor after surgery.  Diet was advanced as tolerated.  Patient began to have bowel function on postop day 3.  By postop day 4, she was tolerating a solid diet and pain was controlled with oral medications.  She was urinating without difficulty and ambulating without assistance.  Patient was felt to be in stable condition for discharge to home.   Consults: None  Significant Diagnostic Studies: labs: cbc, bmet  Treatments: IV hydration, analgesia: acetaminophen and Tramadol, and surgery: Robotic LAR  Discharge Exam: Blood pressure 100/63, pulse 83, temperature 98.7 F (37.1 C), temperature source Oral, resp. rate 16, height 5' 4.5" (1.638 m), weight 68.3 kg, SpO2 96 %. General appearance: alert and cooperative GI: soft, non-distended Incision/Wound: clean, dry, intact  Disposition: Discharge disposition: 01-Home or Self Care        Allergies as of 07/09/2022   No Known Allergies      Medication List     TAKE these medications    albuterol 108 (90 Base) MCG/ACT inhaler Commonly known as: VENTOLIN HFA Inhale 2 puffs into the lungs every 6 (six) hours as needed for wheezing or shortness of breath.   ALPRAZolam 0.5 MG tablet Commonly known as: XANAX Take 0.5 mg by mouth 2 (two) times daily as needed for anxiety.   aspirin 81 MG chewable tablet Chew 1 tablet (81 mg total) by mouth 2 (two) times daily. What changed: when to take this   b complex vitamins tablet Take 1 tablet by mouth daily.   CALCIUM-MAGNESIUM-VITAMIN D PO Take 2 tablets by mouth daily.   EQL OMEGA 3 FISH OIL PO Take 565 mg by mouth daily.   estradiol 2 MG  tablet Commonly known as: ESTRACE Take 2 mg by mouth at bedtime.   Magnesium 250 MG Tabs Take 250 mg by mouth at bedtime.   metoprolol succinate 25 MG 24 hr tablet Commonly known as: Toprol XL Take 1 tablet (25 mg total) by mouth daily.   multivitamin with minerals tablet Take 1 tablet by mouth daily.   olmesartan 20 MG tablet Commonly known as: BENICAR Take 0.5 tablets (10 mg total) by mouth daily.   polyethylene glycol 17 g packet Commonly known as: MIRALAX / GLYCOLAX Take 17 g by mouth 2 (two) times daily. What changed:  when to take this reasons to take this   potassium chloride SA 20 MEQ tablet Commonly known as: KLOR-CON M Take 1 tablet (20 mEq total) by mouth 2 (two) times daily.   Retaine HPMC 0.3 % Soln Generic drug: Hypromellose (PF) Place 1 drop into both eyes 3 (three) times daily as needed (for dryness).   rosuvastatin 40 MG tablet Commonly known as: CRESTOR Take 1 tablet (40 mg total) by mouth daily.   Saline 0.9 % Aers Place 1 spray into both nostrils 2 (two) times daily as needed (congestion).   SLEEP AID PO Take 3 tablets by mouth at bedtime.   traMADol 50 MG tablet Commonly known as: ULTRAM Take 1-2 tablets (50-100 mg total) by mouth every 6 (six) hours as needed for moderate pain or severe pain.        Follow-up Information     Leighton Ruff, MD. Schedule an  appointment as soon as possible for a visit in 2 week(s).   Specialties: General Surgery, Colon and Rectal Surgery Contact information: [place] [address] [phone]                 Signed: Rosario Adie 5/91/6384, 7:53 AM

## 2022-07-09 NOTE — Progress Notes (Signed)
Assessment unchanged. Pt verbalized understanding of dc instructions including medications and follow up care and when to call the MD. Pt dc'd to front entrance by NT.

## 2022-07-09 NOTE — Plan of Care (Signed)

## 2022-07-10 ENCOUNTER — Other Ambulatory Visit (HOSPITAL_COMMUNITY): Payer: Self-pay

## 2022-07-10 ENCOUNTER — Other Ambulatory Visit: Payer: Self-pay | Admitting: *Deleted

## 2022-07-10 NOTE — Patient Outreach (Signed)
  Care Coordination [facility] Note Transition Care Management Unsuccessful Follow-up Telephone Call  Date of discharge and from where:  [facility] [phone]   Attempts:  1st Attempt  Reason for unsuccessful TCM follow-up call:  Left voice message  [place] [facility] [phone]

## 2022-07-10 NOTE — Patient Outreach (Signed)
  Care Coordination [facility] Note Transition Care Management Follow-up Telephone Call Date of discharge and from where: [phone] [facility] How have you been since you were released from the hospital? Feeling better everyday Any questions or concerns? No  Items Reviewed: Did the pt receive and understand the discharge instructions provided? Yes  Medications obtained and verified? Yes  Other? No  Any new allergies since your discharge? No  Dietary orders reviewed? No Do you have support at home? Yes   Home Care and Equipment/Supplies: Were home health services ordered? no If so, what is the name of the agency? N/a  Has the agency set up a time to come to the patient's home? not applicable Were any new equipment or medical supplies ordered?  No What is the name of the medical supply agency? N/a Were you able to get the supplies/equipment? not applicable Do you have any questions related to the use of the equipment or supplies? No  Functional Questionnaire: (I = Independent and D = Dependent) ADLs: I  Bathing/Dressing- I  Meal Prep- I  Eating- I  Maintaining continence- I  Transferring/Ambulation- I  Managing Meds- I  Follow up appointments reviewed:  PCP Hospital f/u appt confirmed? Yes  Scheduled to see Tedra Senegal Monday Jul 24, 2022 11:30  Labs to be drawn [phone] at 9:10 before coming to see Dr [facility] f/u appt confirmed? Yes  Scheduled to see Dr Marcello Moores [phone] at 11:50 Are transportation arrangements needed? No  If their condition worsens, is the pt aware to call PCP or go to the Emergency Dept.? Y  Was the patient provided with contact information for the PCP's office or ED? Yes Was to pt encouraged to call back with questions or concerns? Yes  SDOH assessments and interventions completed:   y   Care Coordination Interventions Activated:  No   Care Coordination Interventions:   n     Encounter Outcome:  Pt. Visit Completed    [place] [facility] [phone]

## 2022-07-13 ENCOUNTER — Ambulatory Visit: Payer: Medicare Other | Admitting: Cardiology

## 2022-07-16 LAB — SURGICAL PATHOLOGY

## 2022-07-18 ENCOUNTER — Other Ambulatory Visit: Payer: Medicare Other

## 2022-07-18 DIAGNOSIS — E042 Nontoxic multinodular goiter: Secondary | ICD-10-CM

## 2022-07-18 DIAGNOSIS — D7589 Other specified diseases of blood and blood-forming organs: Secondary | ICD-10-CM

## 2022-07-18 DIAGNOSIS — E7800 Pure hypercholesterolemia, unspecified: Secondary | ICD-10-CM | POA: Diagnosis not present

## 2022-07-18 DIAGNOSIS — D508 Other iron deficiency anemias: Secondary | ICD-10-CM

## 2022-07-18 DIAGNOSIS — I10 Essential (primary) hypertension: Secondary | ICD-10-CM | POA: Diagnosis not present

## 2022-07-18 DIAGNOSIS — R739 Hyperglycemia, unspecified: Secondary | ICD-10-CM

## 2022-07-18 DIAGNOSIS — R7989 Other specified abnormal findings of blood chemistry: Secondary | ICD-10-CM

## 2022-07-18 DIAGNOSIS — E782 Mixed hyperlipidemia: Secondary | ICD-10-CM

## 2022-07-18 NOTE — Addendum Note (Signed)
Addended by: Geradine Girt D on: 07/18/2022 09:43 AM   Modules accepted: Orders

## 2022-07-18 NOTE — Addendum Note (Signed)
Addended by: Geradine Girt D on: 07/18/2022 09:45 AM   Modules accepted: Orders

## 2022-07-18 NOTE — Addendum Note (Signed)
Addended by: Geradine Girt D on: 07/18/2022 09:22 AM   Modules accepted: Orders

## 2022-07-18 NOTE — Addendum Note (Signed)
Addended by: Geradine Girt D on: 07/18/2022 12:05 PM   Modules accepted: Orders

## 2022-07-19 LAB — CBC WITH DIFFERENTIAL/PLATELET
Absolute Monocytes: 475 cells/uL (ref 200–950)
Basophils Absolute: 117 cells/uL (ref 0–200)
Basophils Relative: 1.6 %
Eosinophils Absolute: 402 cells/uL (ref 15–500)
Eosinophils Relative: 5.5 %
HCT: 41.4 % (ref 35.0–45.0)
Hemoglobin: 13.7 g/dL (ref 11.7–15.5)
Lymphs Abs: 1635 cells/uL (ref 850–3900)
MCH: 33.2 pg — ABNORMAL HIGH (ref 27.0–33.0)
MCHC: 33.1 g/dL (ref 32.0–36.0)
MCV: 100.2 fL — ABNORMAL HIGH (ref 80.0–100.0)
MPV: 11.7 fL (ref 7.5–12.5)
Monocytes Relative: 6.5 %
Neutro Abs: 4672 cells/uL (ref 1500–7800)
Neutrophils Relative %: 64 %
Platelets: 206 10*3/uL (ref 140–400)
RBC: 4.13 10*6/uL (ref 3.80–5.10)
RDW: 11.7 % (ref 11.0–15.0)
Total Lymphocyte: 22.4 %
WBC: 7.3 10*3/uL (ref 3.8–10.8)

## 2022-07-19 LAB — LIPID PANEL
Cholesterol: 164 mg/dL (ref <200)
HDL: 59 mg/dL (ref >=50)
LDL Cholesterol (Calc): 81 mg/dL (calc)
Non-HDL Cholesterol (Calc): 105 mg/dL (calc) (ref <130)
Total CHOL/HDL Ratio: 2.8 (calc) (ref <5.0)
Triglycerides: 143 mg/dL (ref <150)

## 2022-07-19 LAB — COMPLETE METABOLIC PANEL WITH GFR
AG Ratio: 1.1 (calc) (ref 1.0–2.5)
ALT: 19 U/L (ref 6–29)
AST: 43 U/L — ABNORMAL HIGH (ref 10–35)
Albumin: 3.8 g/dL (ref 3.6–5.1)
Alkaline phosphatase (APISO): 167 U/L — ABNORMAL HIGH (ref 37–153)
BUN/Creatinine Ratio: 11 (calc) (ref 6–22)
BUN: 12 mg/dL (ref 7–25)
CO2: 22 mmol/L (ref 20–32)
Calcium: 10.5 mg/dL — ABNORMAL HIGH (ref 8.6–10.4)
Chloride: 105 mmol/L (ref 98–110)
Creat: 1.12 mg/dL — ABNORMAL HIGH (ref 0.60–1.00)
Globulin: 3.5 g/dL (calc) (ref 1.9–3.7)
Glucose, Bld: 128 mg/dL — ABNORMAL HIGH (ref 65–99)
Potassium: 5.2 mmol/L (ref 3.5–5.3)
Sodium: 139 mmol/L (ref 135–146)
Total Bilirubin: 0.5 mg/dL (ref 0.2–1.2)
Total Protein: 7.3 g/dL (ref 6.1–8.1)
eGFR: 53 mL/min/{1.73_m2} — ABNORMAL LOW (ref >=60)

## 2022-07-19 LAB — TSH: TSH: 0.83 mIU/L (ref 0.40–4.50)

## 2022-07-20 NOTE — Progress Notes (Signed)
Cardiology Office Note   Date:  07/28/2022   ID:  Jeanne, Smith 08-21-1951, MRN [id number]  PCP:  Elby Showers, MD  Cardiologist:   Alainah Phang Martinique, MD   Chief Complaint  Patient presents with   Follow-up    4 weeks.   Coronary Artery Disease    History of Present Illness: Jeanne Smith is a 71 y.o. female who is seen for evaluation of AFib. She was last  seen by me in 2019. She has a history of HTN and HLD. She had a prior Nuclear stress test in 2019 which was normal. She was started on Crestor for HLD. BP was treated with Bystolic and ARB.   On June 7 she developed afib with RVR associated with chest pain  with  ventricular rates in the 140's during colonoscopy and was transferred to the ED for further treatment. ED MD thought that dehydration secondary to bowel cleanse,as she went back into SR after IV hydration. Her k+/mag were also low and repleted. She had 4 polps removed and a bx on the colon mass. Repeat colonoscopy and MRI planned. Since she was pending possible GI surgery for colonic mass, with some rectal bleeding, was seen in AFib clinic and did not favor start of anticoagulation at this  time. CHA2DS2VASc  score of 3.  Echo done showing mild LVH with normal systolic function, severe LAE, normal valves.   Has been seen by Leighton Ruff MD with general surgery. MRI showed no extension of tumor in the rectum. Felt favorable for primary resection. Repeat colonoscopy showed one polyp in the transverse colon that was removed. Seeing surgery tomorrow.   When seen last she reported a couple of episodes of chest pain. Fortunately Myoview study was normal.  Similar but not as severe as when she had Afib.  She did undergo colon surgery on August 8. Did well. Stage 1 cancer. Followed by Dr Benay Spice and surgery. No recurrent Afib. No chest pain now.   .     Past Medical History:  Diagnosis Date   Anxiety    Arthritis    oa   Attention deficit disorder    Cancer [facility])     Dysrhythmia 2019   episode of palpitations x 25 January 2018, stress test normal   H/O seasonal allergies    Hyperlipidemia    Hypertension    Hypothyroidism    thyroid nodules, off synthroid > 15 years   Insomnia    Osteoarthritis    Osteopenia    Palpitations    Pneumonia    Thyroid nodule     Past Surgical History:  Procedure Laterality Date   ABDOMINAL HYSTERECTOMY     AUGMENTATION MAMMAPLASTY Bilateral    silicone over 30 years   COLONOSCOPY     COLONOSCOPY WITH PROPOFOL N/A 05/25/2022   Procedure: COLONOSCOPY WITH PROPOFOL;  Surgeon: [name], MD;  Location: [facility];  Service: Gastroenterology;  Laterality: N/A;   CONVERSION TO TOTAL HIP Left 05/16/2018   Procedure: LEFT HIP ANTERIOR APPROACH;  Surgeon: Paralee Cancel, MD;  Location: [facility];  Service: Orthopedics;  Laterality: Left;   FEMUR IM NAIL Right 06/16/2015   Procedure: INTRAMEDULLARY (IM) NAIL FEMORAL;  Surgeon: Renette Butters, MD;  Location: [facility];  Service: Orthopedics;  Laterality: Right;   HARDWARE REMOVAL Right 09/24/2020   Procedure: REMOVAL OF IM ROD/HIP SCREW RIGHT HIP;  Surgeon: Mcarthur Rossetti, MD;  Location: [facility];  Service: Orthopedics;  Laterality: Right;  HARDWARE REMOVAL Right 12/24/2020   Procedure: HARDWARE REMOVAL RIGHT WRIST;  Surgeon: Leandrew Koyanagi, MD;  Location: [facility];  Service: Orthopedics;  Laterality: Right;   HIP SURGERY     JOINT REPLACEMENT     ORIF RADIAL FRACTURE Right 12/24/2020   Procedure: OPEN REDUCTION INTERNAL FIXATION (ORIF) RIGHT RADIAL SHAFT FRACTURE;  Surgeon: Leandrew Koyanagi, MD;  Location: [facility];  Service: Orthopedics;  Laterality: Right;   POLYPECTOMY  05/25/2022   Procedure: POLYPECTOMY;  Surgeon: [name], MD;  Location: [facility];  Service: Gastroenterology;;   TONSILLECTOMY     TOTAL HIP ARTHROPLASTY Right 09/24/2020   Procedure: RIGHT TOTAL HIP ARTHROPLASTY ANTERIOR APPROACH;  Surgeon: Mcarthur Rossetti, MD;  Location: [facility];  Service: Orthopedics;  Laterality: Right;   TUBAL LIGATION     XI ROBOTIC ASSISTED LOWER ANTERIOR RESECTION N/A 07/05/2022   Procedure: XI ROBOTIC ASSISTED LOWER ANTERIOR RESECTION;  Surgeon: Leighton Ruff, MD;  Location: [facility];  Service: General;  Laterality: N/A;     Current Outpatient Medications  Medication Sig Dispense Refill   albuterol (VENTOLIN HFA) 108 (90 Base) MCG/ACT inhaler Inhale 2 puffs into the lungs every 6 (six) hours as needed for wheezing or shortness of breath. 8 g PRN   ALPRAZolam (XANAX) 0.5 MG tablet Take 0.5 mg by mouth 2 (two) times daily as needed for anxiety.     aspirin 81 MG chewable tablet Chew 1 tablet (81 mg total) by mouth 2 (two) times daily. (Patient taking differently: Chew 81 mg by mouth in the morning.) 30 tablet 0   b complex vitamins tablet Take 1 tablet by mouth daily.     CALCIUM-MAGNESIUM-VITAMIN D PO Take 2 tablets by mouth daily.     Doxylamine Succinate, Sleep, (SLEEP AID PO) Take 3 tablets by mouth at bedtime.     estradiol (ESTRACE) 2 MG tablet Take 2 mg by mouth at bedtime.      Hypromellose, PF, (RETAINE HPMC) 0.3 % SOLN Place 1 drop into both eyes 3 (three) times daily as needed (for dryness).     Magnesium 250 MG TABS Take 250 mg by mouth at bedtime.     metoprolol succinate (TOPROL XL) 25 MG 24 hr tablet Take 1 tablet (25 mg total) by mouth daily. 90 tablet 3   Multiple Vitamins-Minerals (MULTIVITAMIN WITH MINERALS) tablet Take 1 tablet by mouth daily.     olmesartan (BENICAR) 20 MG tablet Take 0.5 tablets (10 mg total) by mouth daily. 45 tablet 3   Omega-3 Fatty Acids (EQL OMEGA 3 FISH OIL PO) Take 565 mg by mouth daily.     polyethylene glycol (MIRALAX / GLYCOLAX) packet Take 17 g by mouth 2 (two) times daily. (Patient taking differently: Take 17 g by mouth daily as needed for moderate constipation.) 14 each 0   potassium chloride SA (KLOR-CON M) 20 MEQ tablet Take 1 tablet (20 mEq total) by mouth 2  (two) times daily. 20 tablet 0   rosuvastatin (CRESTOR) 40 MG tablet Take 1 tablet (40 mg total) by mouth daily. 90 tablet 3   Saline 0.9 % AERS Place 1 spray into both nostrils 2 (two) times daily as needed (congestion).      traMADol (ULTRAM) 50 MG tablet Take 1-2 tablets (50-100 mg total) by mouth every 6 (six) hours as needed for moderate pain or severe pain. 30 tablet 0   XIIDRA 5 % SOLN Apply 1 drop to eye 2 (two) times daily.  No current facility-administered medications for this visit.    Allergies:   Patient has no known allergies.    Social History:  The patient  reports that she quit smoking about 16 years ago. Her smoking use included cigarettes. She smoked an average of .25 packs per day. She has never used smokeless tobacco. She reports current alcohol use of about 14.0 standard drinks of alcohol per week. She reports that she does not use drugs.   Family History:  The patient's family history includes Alcoholism in her brother; Cancer in her father; Heart disease in her brother; Heart failure in her mother; Hypertension in her father; Stroke in her father.    ROS:  Please see the history of present illness.   Otherwise, review of systems are positive for none.   All other systems are reviewed and negative.    PHYSICAL EXAM: VS:  BP 120/70 (BP Location: Left Arm, Patient Position: Sitting, Cuff Size: Normal)   Pulse 91   Ht 5' 4.5" (1.638 m)   Wt 138 lb (62.6 kg)   BMI 23.32 kg/m  , BMI Body mass index is 23.32 kg/m. GEN: Well nourished, well developed, in no acute distress  HEENT: normal  Neck: no JVD, carotid bruits, or masses Cardiac: RRR; no murmurs, rubs, or gallops,no edema  Respiratory:  clear to auscultation bilaterally, normal work of breathing GI: soft, nontender MS: no deformity or atrophy  Skin: warm and dry, no rash Neuro:  Strength and sensation are intact Psych: euthymic mood, full affect   EKG:  EKG is not  ordered today.     Recent  Labs: 05/17/2022: Magnesium 2.0 07/18/2022: ALT 19; BUN 12; Creat 1.12; Hemoglobin 13.7; Platelets 206; Potassium 5.2; Sodium 139; TSH 0.83    Lipid Panel    Component Value Date/Time   CHOL 164 07/18/2022 0918   TRIG 143 07/18/2022 0918   HDL 59 07/18/2022 0918   CHOLHDL 2.8 07/18/2022 0918   VLDL 25 06/04/2017 0909   LDLCALC 81 07/18/2022 0918      Wt Readings from Last 3 Encounters:  07/28/22 138 lb (62.6 kg)  07/27/22 136 lb 12.8 oz (62.1 kg)  07/24/22 135 lb 1.9 oz (61.3 kg)      Other studies Reviewed: Additional studies/ records that were reviewed today include:   Stress Myoview: 02/13/18: Study Highlights     The left ventricular ejection fraction is hyperdynamic (>65%). Nuclear stress EF: 67%. No wall motion abnormality. There was no ST segment deviation noted during stress. This is a low risk study. No ischemia identified. The study is normal.   Candee Furbish, MD   Echo 05/18/22: IMPRESSIONS     1. Left ventricular ejection fraction, by estimation, is 70 to 75%. Left  ventricular ejection fraction by PLAX is 75 %. The left ventricle has  hyperdynamic function. The left ventricle has no regional wall motion  abnormalities. There is mild left  ventricular hypertrophy. Left ventricular diastolic parameters are  consistent with Grade I diastolic dysfunction (impaired relaxation).   2. Right ventricular systolic function is normal. The right ventricular  size is normal.   3. Left atrial size was severely dilated.   4. The mitral valve is grossly normal. Trivial mitral valve  regurgitation.   5. The aortic valve is tricuspid. Aortic valve regurgitation is not  visualized.   6. The inferior vena cava is normal in size with greater than 50%  respiratory variability, suggesting right atrial pressure of 3 mmHg.   Comparison(s): No  prior Echocardiogram.   Myoview 06/13/22: Study Highlights      The study is normal. The study is low risk.   No ST deviation was  noted.   LV perfusion is normal. There is no evidence of ischemia. There is no evidence of infarction.   Left ventricular function is normal. End diastolic cavity size is normal. End systolic cavity size is normal.   Prior study available for comparison from 02/13/2018. No changes compared to prior study.    ASSESSMENT AND PLAN:  1.  Atrial fibrillation paroxysmal. In NSR today. [place] Vasc of 3. Will defer anticoagulation for now. If she does have recurrence would recommend switching ASA to eliquis. Continue Toprol XL 25 mg daily  2. HTN - was hypotensive on olmesartan 40 mg daily and HCTZ. These were held. Now on olmesartan at 10 mg daily and Toprol XL 25 mg daily. Controlled.  3. CAD. review of chest CT in June shows significant coronary and aortic atherosclerosis. She has had 2 episodes of chest pain. Will continue ASA. Recent Myoview was normal.   4. Hypercholesterolemia. Last LDL 160 on Crestor 10 mg daily. With increased dose to  40 mg daily LDL came down to 81. Will continue Rx.   5. Right low back pain with symptoms concerning for sciatica. Will need follow up with ortho/Neurosurgery  6. Rectal carcinoma. Now s/p surgery. Stage 1. Repeat colonoscopy in one year.   I will follow up in 6 months    Current medicines are reviewed at length with the patient today.  The patient does not have concerns regarding medicines.    Signed, Luca Dyar Martinique, MD  07/28/2022 11:38 AM    [facility] [address], [place], [place], [address] Phone [phone], Fax [phone]

## 2022-07-24 ENCOUNTER — Ambulatory Visit (INDEPENDENT_AMBULATORY_CARE_PROVIDER_SITE_OTHER): Payer: Medicare Other | Admitting: Internal Medicine

## 2022-07-24 ENCOUNTER — Encounter: Payer: Self-pay | Admitting: Internal Medicine

## 2022-07-24 VITALS — BP 126/70 | HR 97 | Temp 97.6°F | Ht 65.5 in | Wt 135.1 lb

## 2022-07-24 DIAGNOSIS — C189 Malignant neoplasm of colon, unspecified: Secondary | ICD-10-CM | POA: Diagnosis not present

## 2022-07-24 DIAGNOSIS — F439 Reaction to severe stress, unspecified: Secondary | ICD-10-CM

## 2022-07-24 DIAGNOSIS — F411 Generalized anxiety disorder: Secondary | ICD-10-CM

## 2022-07-24 DIAGNOSIS — R739 Hyperglycemia, unspecified: Secondary | ICD-10-CM

## 2022-07-24 DIAGNOSIS — E042 Nontoxic multinodular goiter: Secondary | ICD-10-CM

## 2022-07-24 DIAGNOSIS — Z09 Encounter for follow-up examination after completed treatment for conditions other than malignant neoplasm: Secondary | ICD-10-CM

## 2022-07-24 DIAGNOSIS — Z96643 Presence of artificial hip joint, bilateral: Secondary | ICD-10-CM

## 2022-07-24 DIAGNOSIS — S62101G Fracture of unspecified carpal bone, right wrist, subsequent encounter for fracture with delayed healing: Secondary | ICD-10-CM

## 2022-07-24 DIAGNOSIS — I10 Essential (primary) hypertension: Secondary | ICD-10-CM

## 2022-07-24 DIAGNOSIS — E7800 Pure hypercholesterolemia, unspecified: Secondary | ICD-10-CM | POA: Diagnosis not present

## 2022-07-24 DIAGNOSIS — M8008XA Age-related osteoporosis with current pathological fracture, vertebra(e), initial encounter for fracture: Secondary | ICD-10-CM | POA: Diagnosis not present

## 2022-07-24 DIAGNOSIS — E782 Mixed hyperlipidemia: Secondary | ICD-10-CM | POA: Diagnosis not present

## 2022-07-24 NOTE — Progress Notes (Signed)
Subjective:    Patient ID: Jeanne Smith, female    DOB: [birth year], 71 y.o.   MRN: [id number]  HPI 71 year old Female seen for follow up. Had recent surgery for colon cancer by Dr.  Leighton Ruff. Patient would like to see a GI Oncologist. Have suggested Dr. Benay Spice.  She was being evaluated on June 7 by Dr. Tarri Glenn for change in bowel habits with blood and mucus in stool.  Had never had colonoscopy.  During the procedure, she vomited a small amount of bile colored fluid approximately 30 cc.  Oral suctioning performed and 4 mg of Zofran administered.  Subsequently became tachycardic with rate of 155 bpm.  Esmolol was given.  EMS was called to transport her to the emergency department to evaluate for A-fib with RVR.  She was found to have a nonobstructing rectal mass during the procedure.  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  2 other polyps were removed from the descending colon.  Also, polyp was removed from the transverse colon.    Patient presented to the Emergency department June 7 from [place] with rapid heart rate and chest pain.  She was receiving a colonoscopy as an outpatient when she was found to be in atrial fibrillation with RVR with rates in the 140s.  She was transferred by EMS to the emergency department.  Her potassium was 3.1 and her creatinine was 1.21.  Was given IV fluids, potassium and magnesium.  She was not anticoagulated at that time as it was felt that her A-fib was triggered by volume depletion.  She was discharged home.  She underwent a repeat colonoscopy June 29.  Mass was confirmed at 12 to 14 cm with a tattoo at the distal end.  A 6 mm polyp was removed from the proximal transverse colon it was a sessile serrated adenoma without dysplasia.  Tumor was staged as a T1/T2 N0 lesion.  Pelvic MRI July 11 showed no rectal mass.  Subsequently underwent  robotic assisted low anterior resection on July 05, 2022.  No evidence of metastatic disease.  Anastomosis is at 11 cm from the anal verge by proctoscopy.  Apparently pathology showed tumor had invaded the muscularis propria.  No lymphovascular or perineural invasion.   Past medical history: History of hypertension, anxiety and insomnia.  Left wrist fracture with surgical repair January 2022.  She is a former smoker.  She drinks alcohol daily.  Comminuted right humeral fracture July 2016.  Hip arthroplasty June 2019.  In January 2022 she had hardware removal from right hip and it was converted to a total hip arthroplasty.  Had protracted respiratory infection November 2022 but did not have COVID-19 until January 2023.  History of attention deficit disorder.  Had right thyroid nodule evaluated in 1994.  It was aspirated in 1995 and was found to be benign.  In May 1998 a 3.8 cm nodule was noted in right lower lobe of thyroid compatible with a functioning adenoma.  She has seen Dr. Forde Dandy in the past.  History of uterine fibroids and ovarian cyst.  History of cervical dysplasia.  Had breast augmentation 1998 by Dr. Dessie Coma.  Removal of endocervical polyp by Dr. Nori Riis February 1999.  Had hysterectomy in 2006.  Right lateral breast lymph node excision by Dr. Mendel Ryder in 1997 which was benign.  Tonsillectomy as a child.  D&C after miscarriage  in 1995.  Had a neck injury in 2005.  Social history: She is a widow.  She is a former Writer.  Has 2 adult daughters and is adult son.  Son has had issues with alcohol abuse and is currently living here in [place].  1 daughter lives here in [place] another daughter is married and lives in [place].  Family history: Father died at age 36 with hypertension prostate and lung cancer.  Mother living age 33 but fairly healthy.  1 brother died at age 28 in 11 with history of alcoholism and experienced a spontaneous cardiac arrest.  No known drug  allergies.   Taking rosuvastatin and lipids are normal. Now on rosuvastatin '40mg'$  daily.  She will be seeing Dr. Martinique, Cardiologist on September 1 follow-up on paroxysmal atrial fibrillation and hyperlipidemia.    Review of Systems having left sided radiculopathy-apparently has been present for some time and will need to get MRI of the LS spine.     Objective:   Physical Exam BP 126/70, pulse 97, temperature 97.6 degrees pulse oximetry 99% weight 135 pounds 1.9 ounces BMI 22.14  She  looks thin and frail.  She looks fatigued.  Chest clear.  Cardiac exam: Regular rate and rhythm.     Assessment & Plan:   Patient was concerned about elevated serum glucose but hemoglobin A1c is excellent at 5.2% today.  Lumbar radiculopathy- will likely need to see neurosurgery for evaluation.  This has been present for some time.  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 Benay Spice  History of paroxysmal atrial fibrillation-to be followed by Dr. Peter Martinique.  She has been seen in the A-fib clinic had CHADS2 DS 2 VASc score of 3.  Echo showed mild LVH with normal systolic function, severe left atrial enlargement and normal valves.  Situational stress with son who is an alcoholic and is currently living in her home and she is living with her long-term partner.  This has been stressful for her as sometimes police have to be called due to his behavior.  He was in a long-term care facility in [place] for about a year or so before moving back to [place].  She also has an elderly mother who resides here in town but is doing fairly well.  Osteoporosis-bone density study ordered.  She has had wrist fracture  History of thyroid nodules and has previously seen Dr. Carrolyn Meiers.  Anxiety treated with Xanax.  She is not to take Xanax if she is drinking alcohol  Hypertension treated with Benicar  Hyperlipidemia treated with Crestor-lipids were normal on August 22.  However  had mixed hyperlipidemia in February 2023.  History of elevated serum glucose but hemoglobin A1c August 28 was 5.2%  Plan: The urgent need is for her to see Dr. Julieanne Manson for Oncology consultation.  She also needs evaluation for paroxysmal atrial fib with Dr. Martinique.  She is going to need bone density study.  She is having lumbar radiculopathy and may need to see neurosurgeon for epidural steroid injection.  May need a trial of physical therapy first.  She needs counseling for situational stress with her son.

## 2022-07-25 LAB — HEMOGLOBIN A1C
Hgb A1c MFr Bld: 5.2 % of total Hgb (ref <5.7)
Mean Plasma Glucose: 103 mg/dL
eAG (mmol/L): 5.7 mmol/L

## 2022-07-26 ENCOUNTER — Other Ambulatory Visit: Payer: Self-pay

## 2022-07-26 NOTE — Progress Notes (Signed)
The proposed treatment discussed in conference is for discussion purpose only and is not a binding recommendation.  The patients have not been physically examined, or presented with their treatment options.  Therefore, final treatment plans cannot be decided.  

## 2022-07-27 ENCOUNTER — Inpatient Hospital Stay: Payer: Medicare Other | Attending: Oncology | Admitting: Oncology

## 2022-07-27 ENCOUNTER — Inpatient Hospital Stay: Payer: Medicare Other

## 2022-07-27 VITALS — BP 129/70 | HR 82 | Temp 98.1°F | Resp 18 | Ht 65.0 in | Wt 136.8 lb

## 2022-07-27 DIAGNOSIS — G8929 Other chronic pain: Secondary | ICD-10-CM

## 2022-07-27 DIAGNOSIS — C188 Malignant neoplasm of overlapping sites of colon: Secondary | ICD-10-CM | POA: Diagnosis not present

## 2022-07-27 DIAGNOSIS — Z7289 Other problems related to lifestyle: Secondary | ICD-10-CM | POA: Diagnosis not present

## 2022-07-27 DIAGNOSIS — M549 Dorsalgia, unspecified: Secondary | ICD-10-CM

## 2022-07-27 DIAGNOSIS — E785 Hyperlipidemia, unspecified: Secondary | ICD-10-CM | POA: Diagnosis not present

## 2022-07-27 DIAGNOSIS — K7460 Unspecified cirrhosis of liver: Secondary | ICD-10-CM

## 2022-07-27 DIAGNOSIS — E039 Hypothyroidism, unspecified: Secondary | ICD-10-CM | POA: Diagnosis not present

## 2022-07-27 DIAGNOSIS — Z79899 Other long term (current) drug therapy: Secondary | ICD-10-CM | POA: Diagnosis not present

## 2022-07-27 DIAGNOSIS — I10 Essential (primary) hypertension: Secondary | ICD-10-CM

## 2022-07-27 LAB — CEA (ACCESS): CEA (CHCC): 4.41 ng/mL (ref 0.00–5.00)

## 2022-07-27 NOTE — Progress Notes (Signed)
[facility] Patient Consult   Requesting MD: Elby Showers, Md [address] [place],  [place] [address]   Jeanne Smith 71 y.o.  July 16, 1951    Reason for Consult: Colon cancer   HPI: Ms. Suitt was treated for a perianal abscess in April.  She subsequently developed rectal bleeding.  She was referred to Dr. Tarri Glenn and underwent a colonoscopy on 05/03/2022.  A mass was found in the rectum at 12-14 cm from the anal verge.  Biopsies were obtained and the area was tattooed.  2 polyps were removed from the descending colon.  A polyp was removed from the transverse colon.  She developed SVT during the procedure.  The right side of the colon was not examined.  The pathology revealed a tubular adenoma involving the transverse colon polyp, hyperplastic changes in the descending colon polyps, and invasive well to moderately differentiated adenocarcinoma arising in an adenoma with high-grade dysplasia at the rectum.  CTs of the chest, abdomen, and pelvis on 05/12/2022 revealed hepatic steatosis and changes of cirrhosis..  A nodular polypoid lesion and an area of circumferential thickening were noted in the ascending colon.  No lymphadenopathy.  She underwent a repeat colonoscopy 05/25/2022.  A mass was confirmed at 12-14 cm with a tattoo at the distal end.  A 6 mm polyp was removed from the proximal transverse colon.  The pathology revealed a sessile serrated adenoma without dysplasia.  A pelvic MRI on 06/06/2022 revealed no rectal mass.  No mesorectal lymph nodes.  The tumor was staged as a T1/T2 N0 lesion. She was referred to Dr. Marcello Moores and was taken to the operating room for a robotic assisted low anterior resection on 07/05/2022.  There was no evidence of metastatic disease.  The anastomosis is at 11 cm from the anal verge by proctoscopy.  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  Tumor  invaded muscularis propria.  No macroscopic tumor perforation.  No lymphovascular or perineural invasion.  Section margins are negative.  0/15 lymph nodes.  No loss of mismatch repair protein expression.  She is referred for oncology evaluation.  She reports a mucus discharge from the rectum.  She is constipated.  Past Medical History:  Diagnosis Date   Anxiety    Arthritis    oa   Attention deficit disorder    Cancer [facility])    Dysrhythmia 2019   episode of palpitations x 25 January 2018, stress test normal   H/O seasonal allergies    Hyperlipidemia    Hypertension    Hypothyroidism    thyroid nodules, off synthroid > 15 years   Insomnia    Osteoarthritis    Osteopenia    Palpitations    Pneumonia    Thyroid nodule     .  G4, P3, 1 miscarriage   .  Atrial fibrillation 05/03/2022 during colonoscopy Past Surgical History:  Procedure Laterality Date   ABDOMINAL HYSTERECTOMY     AUGMENTATION MAMMAPLASTY Bilateral    silicone over 30 years   COLONOSCOPY     COLONOSCOPY WITH PROPOFOL N/A 05/25/2022   Procedure: COLONOSCOPY WITH PROPOFOL;  Surgeon: [name], MD;  Location: [facility];  Service: Gastroenterology;  Laterality: N/A;   CONVERSION TO TOTAL HIP Left 05/16/2018   Procedure: LEFT HIP ANTERIOR APPROACH;  Surgeon: Paralee Cancel, MD;  Location: [facility];  Service: Orthopedics;  Laterality: Left;   FEMUR IM NAIL Right 06/16/2015   Procedure: INTRAMEDULLARY (  IM) NAIL FEMORAL;  Surgeon: Renette Butters, MD;  Location: [place];  Service: Orthopedics;  Laterality: Right;   HARDWARE REMOVAL Right 09/24/2020   Procedure: REMOVAL OF IM ROD/HIP SCREW RIGHT HIP;  Surgeon: Mcarthur Rossetti, MD;  Location: [facility];  Service: Orthopedics;  Laterality: Right;   HARDWARE REMOVAL Right 12/24/2020   Procedure: HARDWARE REMOVAL RIGHT WRIST;  Surgeon: Leandrew Koyanagi, MD;  Location: [place];  Service: Orthopedics;  Laterality: Right;   HIP SURGERY     JOINT REPLACEMENT      ORIF RADIAL FRACTURE Right 12/24/2020   Procedure: OPEN REDUCTION INTERNAL FIXATION (ORIF) RIGHT RADIAL SHAFT FRACTURE;  Surgeon: Leandrew Koyanagi, MD;  Location: [place];  Service: Orthopedics;  Laterality: Right;   POLYPECTOMY  05/25/2022   Procedure: POLYPECTOMY;  Surgeon: [name], MD;  Location: [facility];  Service: Gastroenterology;;   TONSILLECTOMY     TOTAL HIP ARTHROPLASTY Right 09/24/2020   Procedure: RIGHT TOTAL HIP ARTHROPLASTY ANTERIOR APPROACH;  Surgeon: Mcarthur Rossetti, MD;  Location: [facility];  Service: Orthopedics;  Laterality: Right;   TUBAL LIGATION     XI ROBOTIC ASSISTED LOWER ANTERIOR RESECTION N/A 07/05/2022   Procedure: XI ROBOTIC ASSISTED LOWER ANTERIOR RESECTION;  Surgeon: Leighton Ruff, MD;  Location: [facility];  Service: General;  Laterality: N/A;    Medications: Reviewed  Allergies: No Known Allergies  Family history: Her father had lung cancer and prostate cancer.  Social History:   She lives in [place].  She is retired Marine scientist.  She does not use cigarettes.  No risk factor for HIV or hepatitis.  No transfusion history.  She reports daily alcohol use-cocktails and wine  ROS:   Positives include: Chronic back pain, rectal bleeding prior to the low anterior resection, constipation, mucus discharge from the rectum following surgery  A complete ROS was otherwise negative.  Physical Exam:  Blood pressure 129/70, pulse 82, temperature 98.1 F (36.7 C), temperature source Oral, resp. rate 18, height _0  (1.651 m), weight 136 lb 12.8 oz (62.1 kg), SpO2 98 %.  HEENT: Oropharynx without visible mass, neck without mass Lungs: Clear bilaterally Cardiac: Regular rate and rhythm Abdomen: Healed surgical incisions, no hepatosplenomegaly, no mass, nontender  Vascular: No leg edema Lymph nodes: No cervical, supraclavicular, axillary, or inguinal nodes Neurologic: Alert and oriented, the motor exam appears intact in the upper and lower  extremities bilaterally Skin: Few telangiectasias at the upper chest and neck Musculoskeletal: No spine tenderness   LAB:  CBC  Lab Results  Component Value Date   WBC 7.3 07/18/2022   HGB 13.7 07/18/2022   HCT 41.4 07/18/2022   MCV 100.2 (H) 07/18/2022   PLT 206 07/18/2022   NEUTROABS 4,672 07/18/2022        CMP  Lab Results  Component Value Date   NA 139 07/18/2022   K 5.2 07/18/2022   CL 105 07/18/2022   CO2 22 07/18/2022   GLUCOSE 128 (H) 07/18/2022   BUN 12 07/18/2022   CREATININE 1.12 (H) 07/18/2022   CALCIUM 10.5 (H) 07/18/2022   PROT 7.3 07/18/2022   ALBUMIN 3.3 (L) 05/03/2022   AST 43 (H) 07/18/2022   ALT 19 07/18/2022   ALKPHOS 125 05/03/2022   BILITOT 0.5 07/18/2022   GFRNONAA 51 (L) 07/08/2022   GFRAA 87 01/17/2021     No results found for: "CEA1", "CAN199", "CA125"  Imaging:  As per HPI, CT images from 05/12/2022-reviewed    Assessment/Plan:   Rectal  cancer Mass at 12-14 cm from the anal verge on colonoscopy 05/03/2022-invasive well to moderately differentiated adenocarcinoma arising in an adenoma with high-grade dysplasia CTs 05/12/2022-small polypoid lesion and masslike area of thickening in the ascending colon, no evidence of metastatic disease, changes of cirrhosis and hepatic steatosis Colonoscopy 05/25/2022-nonobstructing mass at 12-14 cm, tattoo at the distal end, 6 mm polyp in the proximal transverse colon no right colon mass lesion Pelvic MRI 06/06/2022-no primary rectal tumor visualized, MRI stage T1/T2, N0 Robotic low anterior resection 07/05/2022-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 Tubular adenoma 05/03/2022, sessile serrated adenoma 05/25/2022, histologic  description of gross polyps on gross specimen not described histologically on final pathology report 3.  Changes of cirrhosis on CT 05/12/2022 4.  Alcohol use 5.  Hyperlipidemia 6.  Hypertension 7.  Chronic back pain 8.  Hypothyroidism   Disposition:   Ms. Norling been diagnosed with rectal cancer.  I reviewed details of the surgery pathology report, the prognosis, and adjuvant treatment options with her.  She had clinical evidence of early stage rectal cancer on the preoperative staging evaluation.  The final pathology confirmed a stage I cancer.  She has a good prognosis for a long-term disease-free survival.  I do not recommend adjuvant systemic therapy or radiation.  She does not appear to have hereditary nonpolyposis colon cancer syndrome, but her family members are increased risk of developing colorectal cancer and should receive appropriate screening.  We discussed diet and exercise maneuvers that may decrease the risk of developing colorectal cancer.  She will follow-up with Dr. Renold Genta to discuss the CT findings suggestive of cirrhosis.  She will schedule a follow-up colonoscopy with Dr. Tarri Glenn in 1 year.  We will obtain a baseline CEA today.  She will return for an office visit and CEA in 6 months.  Betsy Coder, MD  07/27/2022, 5:36 PM

## 2022-07-28 ENCOUNTER — Telehealth: Payer: Self-pay | Admitting: Physical Medicine and Rehabilitation

## 2022-07-28 ENCOUNTER — Other Ambulatory Visit: Payer: Self-pay

## 2022-07-28 ENCOUNTER — Encounter: Payer: Self-pay | Admitting: Cardiology

## 2022-07-28 ENCOUNTER — Ambulatory Visit: Payer: Medicare Other | Attending: Cardiology | Admitting: Cardiology

## 2022-07-28 VITALS — BP 120/70 | HR 91 | Ht 64.5 in | Wt 138.0 lb

## 2022-07-28 DIAGNOSIS — I480 Paroxysmal atrial fibrillation: Secondary | ICD-10-CM | POA: Diagnosis not present

## 2022-07-28 DIAGNOSIS — I25118 Atherosclerotic heart disease of native coronary artery with other forms of angina pectoris: Secondary | ICD-10-CM | POA: Insufficient documentation

## 2022-07-28 DIAGNOSIS — E7800 Pure hypercholesterolemia, unspecified: Secondary | ICD-10-CM | POA: Insufficient documentation

## 2022-07-28 MED ORDER — POTASSIUM CHLORIDE CRYS ER 20 MEQ PO TBCR: 20.0000 meq | EXTENDED_RELEASE_TABLET | Freq: Two times a day (BID) | ORAL | 3 refills | Status: DC

## 2022-07-28 NOTE — Telephone Encounter (Signed)
Patient called needing to schedule an appointment with Dr. Ernestina Patches for an injection in her back. The number to contact patient is [phone]

## 2022-08-05 NOTE — Patient Instructions (Addendum)
Needs bone density study. Needs to see Neurosurgery about lumbar radiculopathy. Dr. Julieanne Manson to see about follow up for colon cancer. Dr. Peter Martinique will see for Cardiology follow up.  Needs vitamin D level checked with history of wrist fracture.

## 2022-09-07 ENCOUNTER — Telehealth: Payer: Self-pay | Admitting: Internal Medicine

## 2022-09-07 MED ORDER — VALACYCLOVIR HCL 500 MG PO TABS: 500.0000 mg | ORAL_TABLET | Freq: Two times a day (BID) | ORAL | 0 refills | Status: DC

## 2022-09-07 NOTE — Telephone Encounter (Signed)
Refill on Valtrex sent. MJB, MD

## 2022-09-07 NOTE — Telephone Encounter (Signed)
Jeanne Smith [phone]  Jeanne Smith called to see if she could get refill on below medication she has a fever blister on her upper ledge of her lip on the left side. She has not gotten this since 01/16/20    valACYclovir (VALTREX) 1000 MG tablet

## 2022-10-05 DIAGNOSIS — H04123 Dry eye syndrome of bilateral lacrimal glands: Secondary | ICD-10-CM | POA: Diagnosis not present

## 2022-10-23 ENCOUNTER — Other Ambulatory Visit: Payer: Medicare Other

## 2022-10-23 DIAGNOSIS — R7989 Other specified abnormal findings of blood chemistry: Secondary | ICD-10-CM

## 2022-10-23 DIAGNOSIS — E782 Mixed hyperlipidemia: Secondary | ICD-10-CM

## 2022-10-23 DIAGNOSIS — E7800 Pure hypercholesterolemia, unspecified: Secondary | ICD-10-CM

## 2022-10-23 DIAGNOSIS — R739 Hyperglycemia, unspecified: Secondary | ICD-10-CM

## 2022-10-24 ENCOUNTER — Ambulatory Visit: Payer: Medicare Other | Admitting: Internal Medicine

## 2022-11-06 DIAGNOSIS — H04123 Dry eye syndrome of bilateral lacrimal glands: Secondary | ICD-10-CM | POA: Diagnosis not present

## 2022-11-26 IMAGING — CT CT CHEST-ABD-PELV W/ CM
2 of 5 series · 12 of 36 positions shown, 14 images · IV contrast (OMNIPAQUE)
Comparison: CT the pelvis 06/15/2015.

CLINICAL DATA: 71-year-old female with new diagnosis of colonic
mass on colonoscopy last week. Evaluate for metastatic disease. *
Tracking Code: BO *

EXAM:
CT CHEST, ABDOMEN, AND PELVIS WITH CONTRAST
TECHNIQUE: Multidetector CT imaging of the chest, abdomen and pelvis was
performed following the standard protocol during bolus
administration of intravenous contrast.

[Series 2: cap with · axial · 0.77mm/px · z∈[+1116,+1611]mm · 9 of 125 slices shown, 11 images]
[im 13/125  mediastinal]
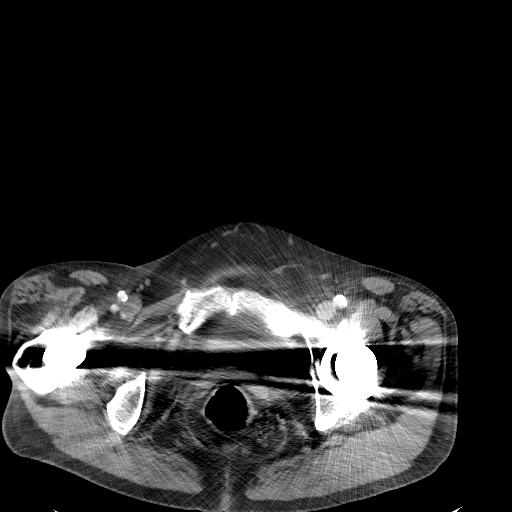
[im 13/125  bone]
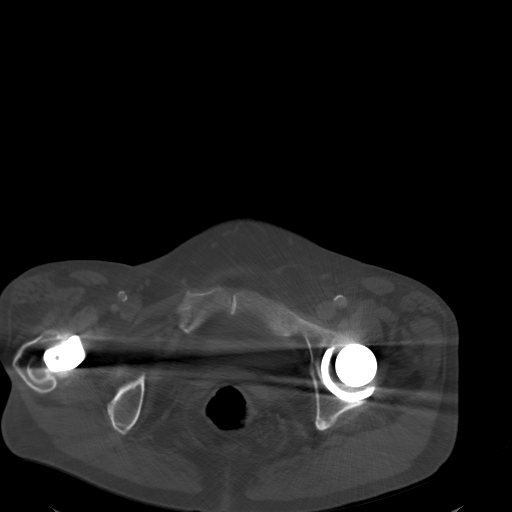
[im 25/125  mediastinal]
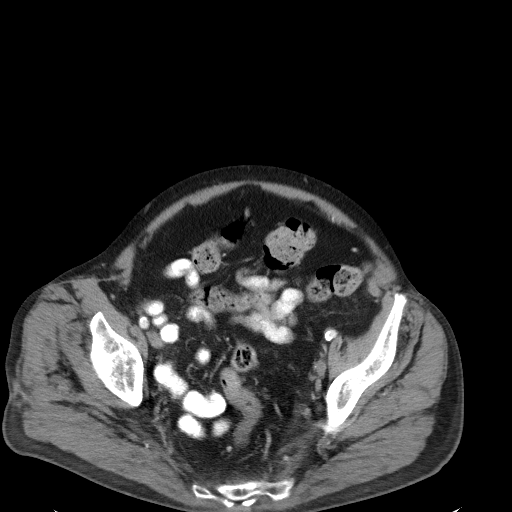
[im 38/125  mediastinal]
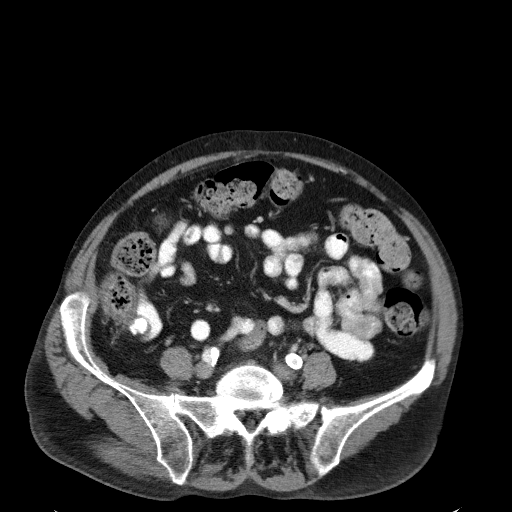
[im 50/125  mediastinal]
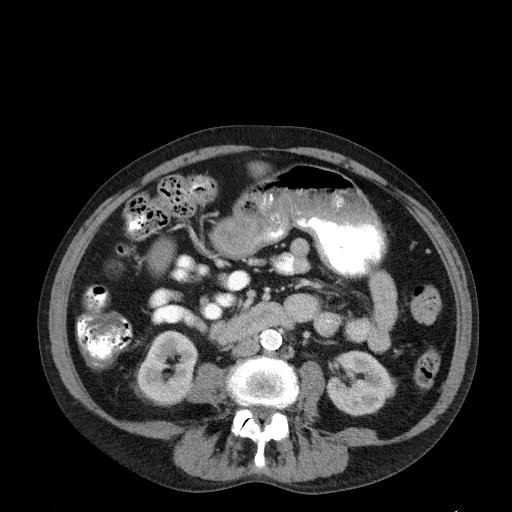
[im 63/125  mediastinal]
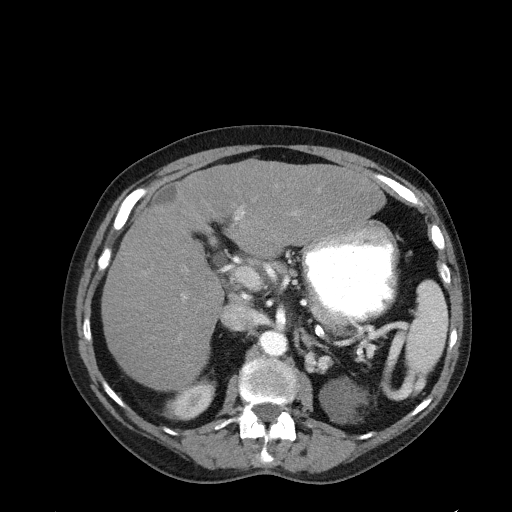
[im 75/125  mediastinal]
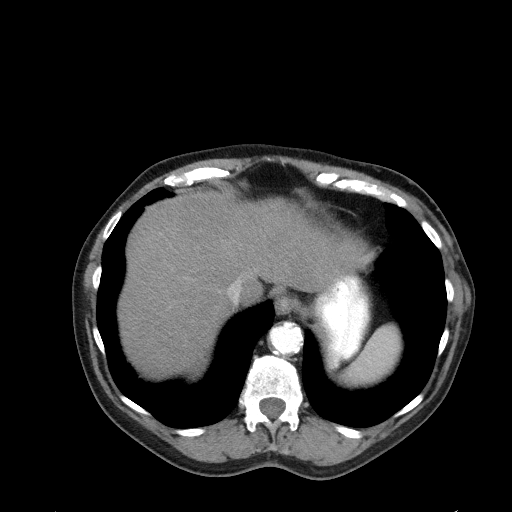
[im 87/125  mediastinal]
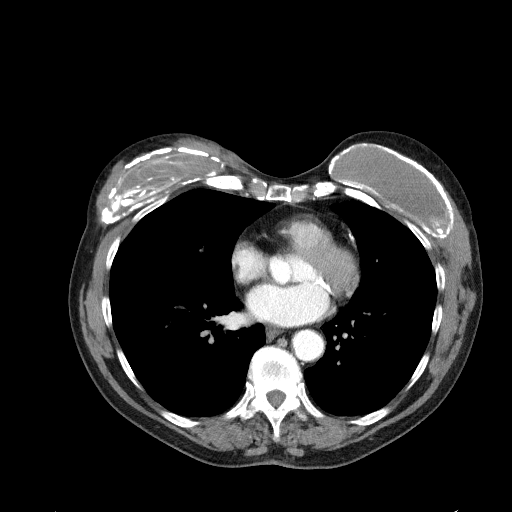
[im 100/125  mediastinal]
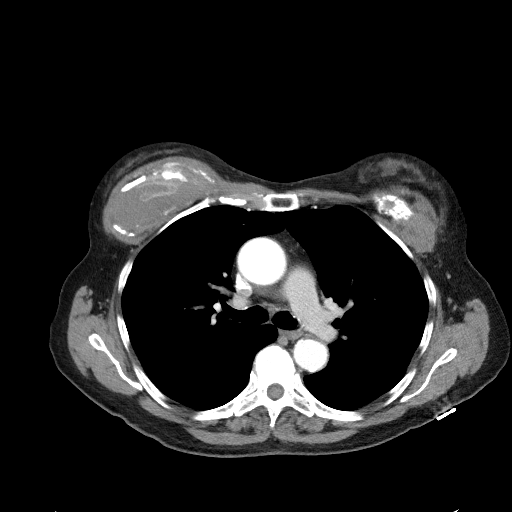
[im 112/125  mediastinal]
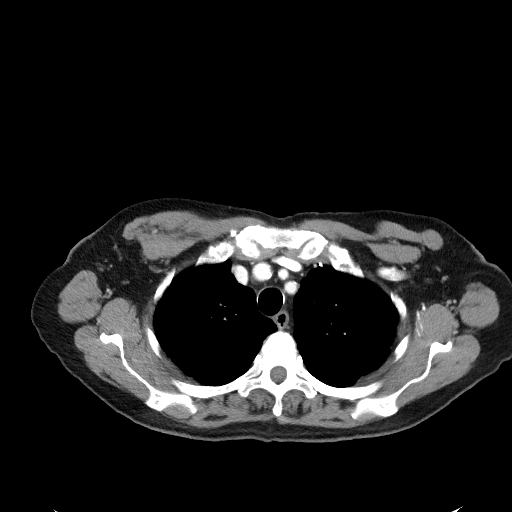
[im 112/125  bone]
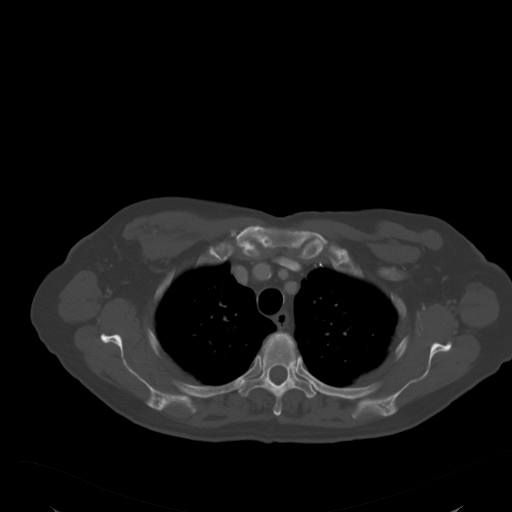

[Series 5: coronals · coronal · 0.83mm/px · 3 of 146 slices shown]
[im 30/146  mediastinal]
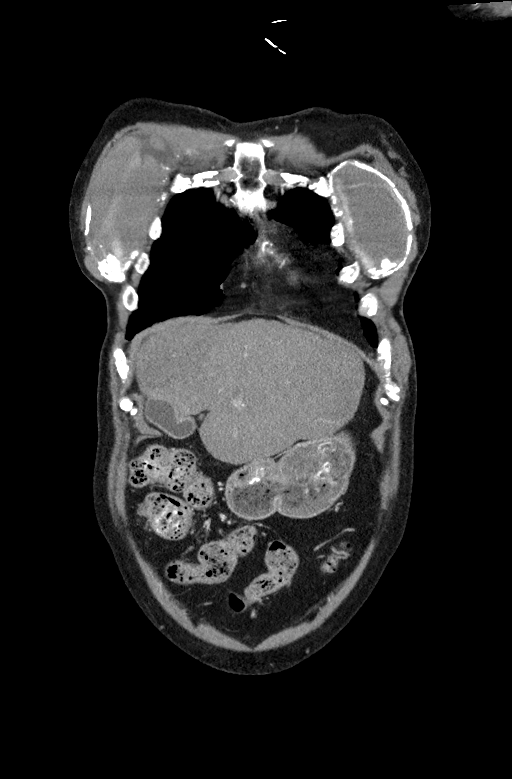
[im 59/146  mediastinal]
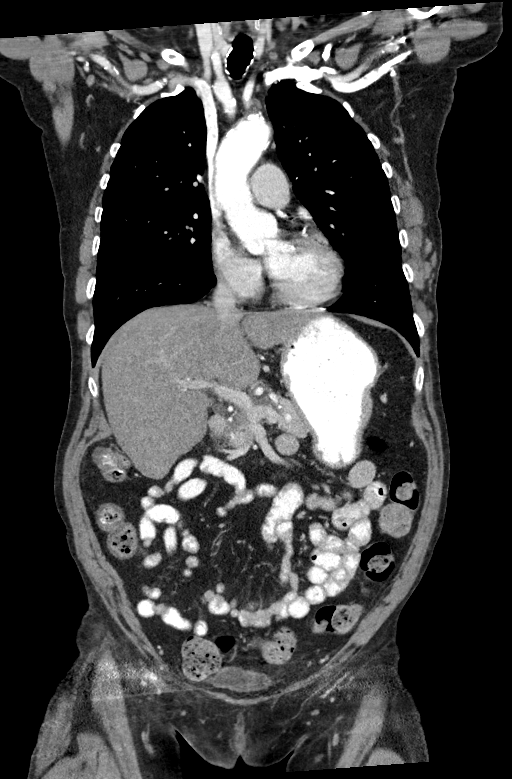
[im 88/146  mediastinal]
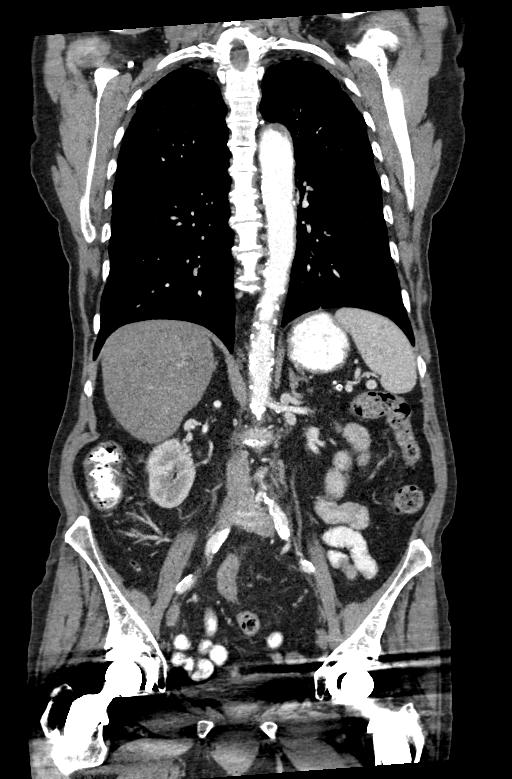

[12 of 36 positions shown; findings below may reference images not displayed]

RADIATION DOSE REDUCTION: This exam was performed according to the
departmental dose-optimization program which includes automated
exposure control, adjustment of the mA and/or kV according to
patient size and/or use of iterative reconstruction technique.

CONTRAST:  80mL OMNIPAQUE IOHEXOL 300 MG/ML  SOLN
FINDINGS: CT CHEST FINDINGS

Cardiovascular: Heart size is normal. There is no significant
pericardial fluid, thickening or pericardial calcification. There is
aortic atherosclerosis, as well as atherosclerosis of the great
vessels of the mediastinum and the coronary arteries, including
calcified atherosclerotic plaque in the left anterior descending and
left circumflex coronary arteries.

Mediastinum/Nodes: No pathologically enlarged mediastinal or hilar
lymph nodes. Please note that accurate exclusion of hilar adenopathy
is limited on noncontrast CT scans. Esophagus is unremarkable in
appearance. No axillary lymphadenopathy.

Lungs/Pleura: No aggressive appearing pulmonary nodules or masses
are noted. No acute consolidative airspace disease. No pleural
effusions.

Musculoskeletal: There are no aggressive appearing lytic or blastic
lesions noted in the visualized portions of the skeleton. Breast
implants with extensive capsular calcifications and evidence of
intracapsular rupture of the right implant.

CT ABDOMEN PELVIS FINDINGS

Hepatobiliary: Diffuse low attenuation throughout the hepatic
parenchyma, indicative of a background of hepatic steatosis. Liver
has a shrunken appearance and nodular contour, indicative of
underlying cirrhosis. No discrete cystic or solid hepatic lesions.
No intra or extrahepatic biliary ductal dilatation. Gallbladder is
unremarkable in appearance.

Pancreas: No pancreatic mass. No pancreatic ductal dilatation. No
pancreatic or peripancreatic fluid collections or inflammatory
changes.

Spleen: Unremarkable.

Adrenals/Urinary Tract: Exophytic 3.9 cm low-attenuation lesion in
the upper pole of the left kidney is compatible with a simple
(Bosniak class 1) cyst (no imaging follow-up is recommended). Right
kidney and bilateral adrenal glands are normal in appearance. No
hydroureteronephrosis. Much of the urinary bladder is completely
obscured by beam hardening artifact from the patient's bilateral hip
arthroplasties. Visualized portions of the urinary bladder are
otherwise unremarkable.

Stomach/Bowel: The appearance of the stomach is normal. No
pathologic dilatation of small bowel or colon. The only potential
colonic lesions confidently identified are in the ascending colon on
axial images 81 and 78 of series 2 where there is a nodular polypoid
appearing lesion and an area of circumferential mural thickening
respectively. The polypoid lesion measures approximately 2.4 cm,
while the mass-like area of circumferential thickening measures
approximately 3.4 x 2.8 cm.

Vascular/Lymphatic: Aortic atherosclerosis, without evidence of
aneurysm or dissection in the abdominal or pelvic vasculature. No
lymphadenopathy noted in the abdomen or pelvis.

Reproductive: Status post hysterectomy. Ovaries are not confidently
identified may be surgically absent or atrophic.

Other: No significant volume of ascites.  No pneumoperitoneum.

Musculoskeletal: Status post bilateral hip arthroplasty. There are
no aggressive appearing lytic or blastic lesions noted in the
visualized portions of the skeleton.
IMPRESSION: 1. Small polypoid lesion and mass-like area of mural thickening in
the ascending colon, as above, which may correspond to the reported
colonic neoplasm. No definite signs of metastatic disease
confidently identified elsewhere in the chest, abdomen or pelvis.
2. Morphologic changes in the liver indicative of underlying
cirrhosis. There is also a background of hepatic steatosis.
3. Aortic atherosclerosis, in addition to three-vessel coronary
artery disease. Assessment for potential risk factor modification,
dietary therapy or pharmacologic therapy may be warranted, if
clinically indicated.
4. Additional incidental findings, as above.

## 2023-01-01 DIAGNOSIS — Z01419 Encounter for gynecological examination (general) (routine) without abnormal findings: Secondary | ICD-10-CM | POA: Diagnosis not present

## 2023-01-01 DIAGNOSIS — Z6823 Body mass index (BMI) 23.0-23.9, adult: Secondary | ICD-10-CM | POA: Diagnosis not present

## 2023-01-01 DIAGNOSIS — R895 Abnormal microbiological findings in specimens from other organs, systems and tissues: Secondary | ICD-10-CM | POA: Diagnosis not present

## 2023-01-01 DIAGNOSIS — N764 Abscess of vulva: Secondary | ICD-10-CM | POA: Diagnosis not present

## 2023-01-04 ENCOUNTER — Encounter (HOSPITAL_COMMUNITY): Payer: Self-pay | Admitting: *Deleted

## 2023-01-10 DIAGNOSIS — H18452 Nodular corneal degeneration, left eye: Secondary | ICD-10-CM | POA: Diagnosis not present

## 2023-01-10 DIAGNOSIS — H04123 Dry eye syndrome of bilateral lacrimal glands: Secondary | ICD-10-CM | POA: Diagnosis not present

## 2023-01-10 DIAGNOSIS — H25813 Combined forms of age-related cataract, bilateral: Secondary | ICD-10-CM | POA: Diagnosis not present

## 2023-01-16 NOTE — Progress Notes (Signed)
Annual Wellness Visit    Patient Care Team: Mercedes Valeriano, Cresenciano Lick, MD as PCP - General [place], Peter M, MD as PCP - Cardiology (Cardiology)  Visit Date: 01/23/23   Chief Complaint  Patient presents with   Medicare Wellness   Annual Exam    Subjective:   Patient: Jeanne Smith, Female    DOB: [birth year], 72 y.o.   MRN: [id number]  Jeanne Smith is a 72 y.o. Female who presents today for her Annual Wellness Visit. She has a history of anxiety, arthritis, ADD, rectal cancer, dysrhythmia, hyperlipidemia, hypertension, hypothyroidism, insomnia, osteoarthritis, osteoporosis, palpitations, pneumonia, thyroid nodule.  Reports having severe dry eye syndrome. Having cataract surgery later this year.  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 CEA is normal. Plans to have a surveillance colonoscopy around 8/24. Followed by her oncologist, Dr. Betsy Coder.   History of hypertension treated with Benicar 10 mg daily, Toprol XL 25 mg daily. Has run out of Benicar but reports blood pressure is stable when measured at home. Blood pressure normal today at 112/72.  History of hyperlipidemia treated with Crestor 40 mg daily. Lipid panel normal on 01/22/23.  History of hypokalemia treated with Klor-Con 20 meq twice daily.  History of osteoarthritis treated with Ultram 50-100 mg every six hours as needed.  History of anxiety treated with Xanax 0.5 mg twice daily as needed.  History of eczema on lower legs. Reports she still has flare-ups that cause itching and discomfort.   Thyroid normal. Denies abdominal symptoms. Has been having difficulty with bowel movements after low anterior resection for rectosigmoid cancer 07/05/22. Taking laxatives as needed.  Mammogram last completed 03/28/19. There is extensive extracapsular silicone rupture bilaterally. No mammographic evidence of malignancy in the bilateral breasts. Recommended repeat in 2022.  Negative Cologuard on 03/27/17.  Recommended repeat in 2021.  DEXA scan last completed 03/27/18. Has osteopenia.   Past Medical History:  Diagnosis Date   Anxiety    Arthritis    oa   Attention deficit disorder    Cancer [facility])    Dysrhythmia 2019   episode of palpitations x 25 January 2018, stress test normal   H/O seasonal allergies    Hyperlipidemia    Hypertension    Hypothyroidism    thyroid nodules, off synthroid > 15 years   Insomnia    Osteoarthritis    Osteopenia    Palpitations    Pneumonia    Thyroid nodule      Family History  Problem Relation Age of Onset   Heart failure Mother    Hypertension Father    Cancer Father        lung   Stroke Father    Heart disease Brother    Alcoholism Brother    Colon cancer Neg Hx    Pancreatic cancer Neg Hx    Esophageal cancer Neg Hx    Liver cancer Neg Hx    Stomach cancer Neg Hx      Social History   Social History Narrative   Not on file     Review of Systems  Constitutional:  Negative for chills, fever, malaise/fatigue and weight loss.  HENT:  Negative for hearing loss, sinus pain and sore throat.   Eyes:  Positive for pain (Dry eye syndrome).  Respiratory:  Negative for cough and hemoptysis.   Cardiovascular:  Negative for chest pain, palpitations, leg swelling and PND.  Gastrointestinal:  Negative for abdominal pain, constipation, diarrhea, heartburn, nausea and vomiting.  Genitourinary:  Negative for dysuria, frequency and urgency.  Musculoskeletal:  Negative for back pain, myalgias and neck pain.  Skin:  Negative for itching and rash.  Neurological:  Negative for dizziness, tingling, seizures and headaches.  Endo/Heme/Allergies:  Negative for polydipsia.  Psychiatric/Behavioral:  Negative for depression. The patient is not nervous/anxious.       Objective:   Vitals: BP 112/72   Pulse 82   Temp 98.8 F (37.1 C) (Tympanic)   Ht '5\' 4"'$  (1.626 m)   Wt 137 lb 12.8 oz (62.5 kg)   SpO2 98%   BMI 23.65 kg/m   Physical Exam Vitals  and nursing note reviewed.  Constitutional:      General: She is not in acute distress.    Appearance: Normal appearance. She is not ill-appearing or toxic-appearing.  HENT:     Head: Normocephalic and atraumatic.     Right Ear: Hearing, tympanic membrane, ear canal and external ear normal.     Left Ear: Hearing, tympanic membrane, ear canal and external ear normal.     Mouth/Throat:     Pharynx: Oropharynx is clear.  Eyes:     Extraocular Movements: Extraocular movements intact.     Pupils: Pupils are equal, round, and reactive to light.  Neck:     Thyroid: No thyroid mass, thyromegaly or thyroid tenderness.     Vascular: No carotid bruit.  Cardiovascular:     Rate and Rhythm: Normal rate and regular rhythm. No extrasystoles are present.    Pulses: Normal pulses.          Dorsalis pedis pulses are 2+ on the right side and 2+ on the left side.       Posterior tibial pulses are 2+ on the right side and 2+ on the left side.     Heart sounds: Normal heart sounds. No murmur heard.    No friction rub. No gallop.  Pulmonary:     Effort: Pulmonary effort is normal.     Breath sounds: Normal breath sounds. No decreased breath sounds, wheezing, rhonchi or rales.  Chest:     Chest wall: No mass.  Abdominal:     Palpations: Abdomen is soft. There is no hepatomegaly, splenomegaly or mass.     Tenderness: There is no abdominal tenderness.     Hernia: No hernia is present.  Musculoskeletal:     Cervical back: Normal range of motion.     Right lower leg: No edema.     Left lower leg: No edema.  Lymphadenopathy:     Cervical: No cervical adenopathy.     Upper Body:     Right upper body: No supraclavicular adenopathy.     Left upper body: No supraclavicular adenopathy.  Skin:    General: Skin is warm and dry.  Neurological:     General: No focal deficit present.     Mental Status: She is alert and oriented to person, place, and time. Mental status is at baseline.     Sensory: Sensation  is intact.     Motor: Motor function is intact. No weakness.     Deep Tendon Reflexes: Reflexes are normal and symmetric.  Psychiatric:        Attention and Perception: Attention normal.        Mood and Affect: Mood normal.        Speech: Speech normal.        Behavior: Behavior normal.        Thought Content: Thought content normal.  Cognition and Memory: Cognition normal.        Judgment: Judgment normal.      Most recent functional status assessment:    01/23/2023   11:11 AM  In your present state of health, do you have any difficulty performing the following activities:  Hearing? 0  Vision? 1  Difficulty concentrating or making decisions? 0  Walking or climbing stairs? 0  Dressing or bathing? 0  Doing errands, shopping? 0  Preparing Food and eating ? N  Using the Toilet? N  In the past six months, have you accidently leaked urine? Y  Do you have problems with loss of bowel control? Y  Comment surgery  Managing your Medications? N  Managing your Finances? N  Housekeeping or managing your Housekeeping? N   Most recent fall risk assessment:    01/23/2023   11:11 AM  Fall Risk   Falls in the past year? 0  Number falls in past yr: 0  Injury with Fall? 0  Risk for fall due to : No Fall Risks  Follow up Falls prevention discussed    Most recent depression screenings:    01/23/2023   11:11 AM 07/24/2022   11:55 AM  PHQ 2/9 Scores  PHQ - 2 Score 0 0   Most recent cognitive screening:    01/23/2023   11:13 AM  6CIT Screen  What Year? 0 points  What month? 0 points  What time? 0 points  Count back from 20 0 points  Months in reverse 0 points  Repeat phrase 0 points  Total Score 0 points     Results:   Studies obtained and personally reviewed by me:  Mammogram last completed 03/28/19. There is extensive extracapsular silicone rupture bilaterally. No mammographic evidence of malignancy in the bilateral breasts. Recommended repeat in 2022.  Negative  Cologuard on 03/27/17. Recommended repeat in 2021.  DEXA scan last completed 03/27/18. Has osteopenia.   Labs:       Component Value Date/Time   NA 140 01/22/2023 1052   K 4.4 01/22/2023 1052   CL 107 01/22/2023 1052   CO2 24 01/22/2023 1052   GLUCOSE 107 (H) 01/22/2023 1052   BUN 19 01/22/2023 1052   CREATININE 1.44 (H) 01/22/2023 1052   CALCIUM 9.6 01/22/2023 1052   PROT 7.0 01/22/2023 1052   ALBUMIN 3.3 (L) 05/03/2022 1620   AST 29 01/22/2023 1052   ALT 16 01/22/2023 1052   ALKPHOS 125 05/03/2022 1620   BILITOT 0.6 01/22/2023 1052   GFRNONAA 51 (L) 07/08/2022 0425   GFRNONAA 75 01/17/2021 0923   GFRAA 87 01/17/2021 0923     Lab Results  Component Value Date   WBC 6.2 01/22/2023   HGB 13.4 01/22/2023   HCT 39.8 01/22/2023   MCV 97.1 01/22/2023   PLT 125 (L) 01/22/2023    Lab Results  Component Value Date   CHOL 153 01/22/2023   HDL 76 01/22/2023   LDLCALC 56 01/22/2023   TRIG 133 01/22/2023   CHOLHDL 2.0 01/22/2023    Lab Results  Component Value Date   HGBA1C 5.6 01/22/2023     Lab Results  Component Value Date   TSH 1.24 01/22/2023    Assessment & Plan:   History of early-stage rectal cancer in clinical remission. CEA is normal. Plans to have a surveillance colonoscopy around 8/24. Followed by her oncologist, Dr. Betsy Coder.   Hypertension: treated with Benicar 10 mg daily, Toprol XL 25 mg daily. Has run out of Benicar  but reports blood pressure is stable when measured at home. Blood pressure normal today at 112/72. Stop Benicar.   Hyperlipidemia: treated with Crestor 40 mg daily. Lipid panel normal on 01/22/23.  Hypokalemia: treated with Klor-Con 20 meq twice daily.  Osteoarthritis: treated with Ultram 50-100 mg every six hours as needed.  Anxiety: treated with Xanax 0.5 mg twice daily as needed.  Vitamin D deficiency: Increase Vitamin D3 to 5,000 units OTC daily. Ordered Vitamin D blood test.  Eczema lower legs: reports she still has  flare-ups that cause itching and discomfort. Prescribed triamcinolone cream 0.1% to be mixed with moisturizer and applied twice daily as needed.  Healthy Maintenance: Thyroid normal. Denies abdominal symptoms. Has been having difficulty with bowel movements after low anterior resection for rectosigmoid cancer 07/05/22. Taking laxatives as needed.  Mammogram: last completed 03/28/19. There is extensive extracapsular silicone rupture bilaterally. No mammographic evidence of malignancy in the bilateral breasts. Recommended repeat in 2022.  Negative Cologuard on 03/27/17. Recommended repeat in 2021.  DEXA scan: last completed 03/27/18. Has osteopenia.  Vaccine Counseling: Reports she is UTD on Covid-19, tetanus vaccine. Administered pneumococcal 20 vaccine. Discussed RSV vaccine.     Annual wellness visit done today including the all of the following: Reviewed patient's Family Medical History Reviewed and updated list of patient's medical providers Assessment of cognitive impairment was done Assessed patient's functional ability Established a written schedule for health screening [place] Completed and Reviewed  Discussed health benefits of physical activity, and encouraged her to engage in regular exercise appropriate for her age and condition.     Vitamin D level requested. Creatinine elevated. Recheck when well hydrated.   I,Alexander Ruley,acting as a Education administrator for Elby Showers, MD.,have documented all relevant documentation on the behalf of Elby Showers, MD,as directed by  Elby Showers, MD while in the presence of Elby Showers, MD.   I, Elby Showers, MD, have reviewed all documentation for this visit. The documentation on 01/24/23 for the exam, diagnosis, procedures, and orders are all accurate and complete.

## 2023-01-22 ENCOUNTER — Inpatient Hospital Stay: Payer: Medicare Other | Attending: Oncology

## 2023-01-22 ENCOUNTER — Other Ambulatory Visit: Payer: Medicare Other

## 2023-01-22 ENCOUNTER — Encounter: Payer: Self-pay | Admitting: Oncology

## 2023-01-22 ENCOUNTER — Inpatient Hospital Stay (HOSPITAL_BASED_OUTPATIENT_CLINIC_OR_DEPARTMENT_OTHER): Payer: Medicare Other | Admitting: Oncology

## 2023-01-22 VITALS — BP 152/84 | HR 93 | Temp 98.2°F | Resp 18 | Ht 64.0 in | Wt 138.0 lb

## 2023-01-22 DIAGNOSIS — R739 Hyperglycemia, unspecified: Secondary | ICD-10-CM | POA: Diagnosis not present

## 2023-01-22 DIAGNOSIS — M549 Dorsalgia, unspecified: Secondary | ICD-10-CM | POA: Insufficient documentation

## 2023-01-22 DIAGNOSIS — R7989 Other specified abnormal findings of blood chemistry: Secondary | ICD-10-CM

## 2023-01-22 DIAGNOSIS — Z79899 Other long term (current) drug therapy: Secondary | ICD-10-CM | POA: Insufficient documentation

## 2023-01-22 DIAGNOSIS — E559 Vitamin D deficiency, unspecified: Secondary | ICD-10-CM | POA: Diagnosis not present

## 2023-01-22 DIAGNOSIS — E785 Hyperlipidemia, unspecified: Secondary | ICD-10-CM | POA: Insufficient documentation

## 2023-01-22 DIAGNOSIS — I10 Essential (primary) hypertension: Secondary | ICD-10-CM | POA: Insufficient documentation

## 2023-01-22 DIAGNOSIS — K7460 Unspecified cirrhosis of liver: Secondary | ICD-10-CM | POA: Insufficient documentation

## 2023-01-22 DIAGNOSIS — C19 Malignant neoplasm of rectosigmoid junction: Secondary | ICD-10-CM | POA: Insufficient documentation

## 2023-01-22 DIAGNOSIS — M8580 Other specified disorders of bone density and structure, unspecified site: Secondary | ICD-10-CM | POA: Diagnosis not present

## 2023-01-22 DIAGNOSIS — C188 Malignant neoplasm of overlapping sites of colon: Secondary | ICD-10-CM | POA: Diagnosis not present

## 2023-01-22 DIAGNOSIS — E782 Mixed hyperlipidemia: Secondary | ICD-10-CM

## 2023-01-22 DIAGNOSIS — R5383 Other fatigue: Secondary | ICD-10-CM

## 2023-01-22 DIAGNOSIS — E039 Hypothyroidism, unspecified: Secondary | ICD-10-CM | POA: Diagnosis not present

## 2023-01-22 DIAGNOSIS — G8929 Other chronic pain: Secondary | ICD-10-CM | POA: Diagnosis not present

## 2023-01-22 LAB — CEA (ACCESS): CEA (CHCC): 4.23 ng/mL (ref 0.00–5.00)

## 2023-01-22 NOTE — Progress Notes (Signed)
  Jeanne Smith OFFICE PROGRESS NOTE   Diagnosis: Rectal cancer  INTERVAL HISTORY:   Jeanne Smith returns as scheduled.  She generally feels well.  Good appetite.  She reports irregular bowel habits since having children.  No bleeding.  Objective:  Vital signs in last 24 hours:  Blood pressure (!) 152/84, pulse 93, temperature 98.2 F (36.8 C), temperature source Oral, resp. rate 18, height 5' 4"$  (1.626 m), weight 138 lb (62.6 kg), SpO2 100 %.    Lymphatics: No cervical, supraclavicular, or inguinal nodes.  "Shotty "high needle left axillary node versus prominent fat Resp: Lungs clear bilaterally Cardio: Regular rate and rhythm GI: No hepatosplenomegaly Vascular: No leg edema   Lab Results:  Lab Results  Component Value Date   WBC 7.3 07/18/2022   HGB 13.7 07/18/2022   HCT 41.4 07/18/2022   MCV 100.2 (H) 07/18/2022   PLT 206 07/18/2022   NEUTROABS 4,672 07/18/2022    CMP  Lab Results  Component Value Date   NA 139 07/18/2022   K 5.2 07/18/2022   CL 105 07/18/2022   CO2 22 07/18/2022   GLUCOSE 128 (H) 07/18/2022   BUN 12 07/18/2022   CREATININE 1.12 (H) 07/18/2022   CALCIUM 10.5 (H) 07/18/2022   PROT 7.3 07/18/2022   ALBUMIN 3.3 (L) 05/03/2022   AST 43 (H) 07/18/2022   ALT 19 07/18/2022   ALKPHOS 125 05/03/2022   BILITOT 0.5 07/18/2022   GFRNONAA 51 (L) 07/08/2022   GFRAA 87 01/17/2021    Lab Results  Component Value Date   CEA 4.41 07/27/2022    Medications: I have reviewed the patient's current medications.   Assessment/Plan: Rectal cancer Mass at 12-14 cm from the anal verge on colonoscopy 05/03/2022-invasive well to moderately differentiated adenocarcinoma arising in an adenoma with high-grade dysplasia CTs 05/12/2022-small polypoid lesion and masslike area of thickening in the ascending colon, no evidence of metastatic disease, changes of cirrhosis and hepatic steatosis Colonoscopy 05/25/2022-nonobstructing mass at 12-14 cm, tattoo at  the distal end, 6 mm polyp in the proximal transverse colon no right colon mass lesion Pelvic MRI 06/06/2022-no primary rectal tumor visualized, MRI stage T1/T2, N0 Robotic low anterior resection 07/05/2022-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 Tubular adenoma 05/03/2022, sessile serrated adenoma 05/25/2022, histologic description of gross polyps on gross specimen not described histologically on final pathology report 3.  Changes of cirrhosis on CT 05/12/2022 4.  Alcohol use 5.  Hyperlipidemia 6.  Hypertension 7.  Chronic back pain 8.  Hypothyroidism     Disposition: Jeanne Smith has a history of early-stage rectal cancer.  She is in clinical remission.  The CEA is normal.  She will schedule a surveillance colonoscopy in approximately August of this year.  She will return for an office visit and CEA in 6 months. She will see Dr. Renold Genta later this week.  She will discuss the CT findings of cirrhosis with Dr. Renold Genta.  Betsy Coder, MD  01/22/2023  2:50 PM

## 2023-01-23 ENCOUNTER — Encounter: Payer: Self-pay | Admitting: Internal Medicine

## 2023-01-23 ENCOUNTER — Ambulatory Visit (INDEPENDENT_AMBULATORY_CARE_PROVIDER_SITE_OTHER): Payer: Medicare Other | Admitting: Internal Medicine

## 2023-01-23 VITALS — BP 112/72 | HR 82 | Temp 98.8°F | Ht 64.0 in | Wt 137.8 lb

## 2023-01-23 DIAGNOSIS — I10 Essential (primary) hypertension: Secondary | ICD-10-CM

## 2023-01-23 DIAGNOSIS — R319 Hematuria, unspecified: Secondary | ICD-10-CM | POA: Diagnosis not present

## 2023-01-23 DIAGNOSIS — C189 Malignant neoplasm of colon, unspecified: Secondary | ICD-10-CM | POA: Diagnosis not present

## 2023-01-23 DIAGNOSIS — Z96643 Presence of artificial hip joint, bilateral: Secondary | ICD-10-CM

## 2023-01-23 DIAGNOSIS — E559 Vitamin D deficiency, unspecified: Secondary | ICD-10-CM | POA: Diagnosis not present

## 2023-01-23 DIAGNOSIS — Z0000 Encounter for general adult medical examination without abnormal findings: Secondary | ICD-10-CM | POA: Diagnosis not present

## 2023-01-23 DIAGNOSIS — R7989 Other specified abnormal findings of blood chemistry: Secondary | ICD-10-CM

## 2023-01-23 DIAGNOSIS — M8580 Other specified disorders of bone density and structure, unspecified site: Secondary | ICD-10-CM

## 2023-01-23 DIAGNOSIS — Z23 Encounter for immunization: Secondary | ICD-10-CM

## 2023-01-23 DIAGNOSIS — E782 Mixed hyperlipidemia: Secondary | ICD-10-CM | POA: Diagnosis not present

## 2023-01-23 DIAGNOSIS — M810 Age-related osteoporosis without current pathological fracture: Secondary | ICD-10-CM | POA: Diagnosis not present

## 2023-01-23 LAB — POCT URINALYSIS DIPSTICK
Bilirubin, UA: NEGATIVE
Blood, UA: POSITIVE
Glucose, UA: NEGATIVE
Ketones, UA: NEGATIVE
Leukocytes, UA: NEGATIVE
Nitrite, UA: NEGATIVE
Protein, UA: POSITIVE — AB
Spec Grav, UA: 1.015 (ref 1.010–1.025)
Urobilinogen, UA: 0.2 E.U./dL
pH, UA: 7 (ref 5.0–8.0)

## 2023-01-23 NOTE — Patient Instructions (Signed)
Discontinue olmesartan. Continue metoprolol.Have bone density study. Come back for repeat creatinine when well hydrated. Triamcinolone cream sent in for eczema.It was a pleasure to see you today.Pneumococcal 20 vaccine given.

## 2023-01-24 ENCOUNTER — Encounter: Payer: Self-pay | Admitting: Internal Medicine

## 2023-01-24 ENCOUNTER — Other Ambulatory Visit: Payer: Self-pay

## 2023-01-24 LAB — COMPLETE METABOLIC PANEL WITH GFR
AG Ratio: 1.2 (calc) (ref 1.0–2.5)
ALT: 16 U/L (ref 6–29)
AST: 29 U/L (ref 10–35)
Albumin: 3.8 g/dL (ref 3.6–5.1)
Alkaline phosphatase (APISO): 131 U/L (ref 37–153)
BUN/Creatinine Ratio: 13 (calc) (ref 6–22)
BUN: 19 mg/dL (ref 7–25)
CO2: 24 mmol/L (ref 20–32)
Calcium: 9.6 mg/dL (ref 8.6–10.4)
Chloride: 107 mmol/L (ref 98–110)
Creat: 1.44 mg/dL — ABNORMAL HIGH (ref 0.60–1.00)
Globulin: 3.2 g/dL (calc) (ref 1.9–3.7)
Glucose, Bld: 107 mg/dL — ABNORMAL HIGH (ref 65–99)
Potassium: 4.4 mmol/L (ref 3.5–5.3)
Sodium: 140 mmol/L (ref 135–146)
Total Bilirubin: 0.6 mg/dL (ref 0.2–1.2)
Total Protein: 7 g/dL (ref 6.1–8.1)
eGFR: 39 mL/min/{1.73_m2} — ABNORMAL LOW (ref >=60)

## 2023-01-24 LAB — CBC WITH DIFFERENTIAL/PLATELET
Absolute Monocytes: 527 cells/uL (ref 200–950)
Basophils Absolute: 50 cells/uL (ref 0–200)
Basophils Relative: 0.8 %
Eosinophils Absolute: 329 cells/uL (ref 15–500)
Eosinophils Relative: 5.3 %
HCT: 39.8 % (ref 35.0–45.0)
Hemoglobin: 13.4 g/dL (ref 11.7–15.5)
Lymphs Abs: 1742 cells/uL (ref 850–3900)
MCH: 32.7 pg (ref 27.0–33.0)
MCHC: 33.7 g/dL (ref 32.0–36.0)
MCV: 97.1 fL (ref 80.0–100.0)
MPV: 11.9 fL (ref 7.5–12.5)
Monocytes Relative: 8.5 %
Neutro Abs: 3553 cells/uL (ref 1500–7800)
Neutrophils Relative %: 57.3 %
Platelets: 125 10*3/uL — ABNORMAL LOW (ref 140–400)
RBC: 4.1 10*6/uL (ref 3.80–5.10)
RDW: 12.7 % (ref 11.0–15.0)
Total Lymphocyte: 28.1 %
WBC: 6.2 10*3/uL (ref 3.8–10.8)

## 2023-01-24 LAB — LIPID PANEL
Cholesterol: 153 mg/dL (ref <200)
HDL: 76 mg/dL (ref >=50)
LDL Cholesterol (Calc): 56 mg/dL (calc)
Non-HDL Cholesterol (Calc): 77 mg/dL (calc) (ref <130)
Total CHOL/HDL Ratio: 2 (calc) (ref <5.0)
Triglycerides: 133 mg/dL (ref <150)

## 2023-01-24 LAB — URINE CULTURE
MICRO NUMBER:: 14620275
SPECIMEN QUALITY:: ADEQUATE

## 2023-01-24 LAB — URINALYSIS, MICROSCOPIC ONLY
Bacteria, UA: NONE SEEN /HPF
Hyaline Cast: NONE SEEN /LPF
RBC / HPF: NONE SEEN /HPF (ref 0–2)

## 2023-01-24 LAB — HEMOGLOBIN A1C
Hgb A1c MFr Bld: 5.6 % of total Hgb (ref <5.7)
Mean Plasma Glucose: 114 mg/dL
eAG (mmol/L): 6.3 mmol/L

## 2023-01-24 LAB — TEST AUTHORIZATION

## 2023-01-24 LAB — TSH: TSH: 1.24 mIU/L (ref 0.40–4.50)

## 2023-01-24 LAB — VITAMIN D 25 HYDROXY (VIT D DEFICIENCY, FRACTURES): Vit D, 25-Hydroxy: 39 ng/mL (ref 30–100)

## 2023-01-24 MED ORDER — TRIAMCINOLONE ACETONIDE 0.1 % EX OINT: TOPICAL_OINTMENT | Freq: Every day | CUTANEOUS | 1 refills | Status: DC | PRN

## 2023-01-24 MED ORDER — VITAMIN D (ERGOCALCIFEROL) 1.25 MG (50000 UNIT) PO CAPS: 50000.0000 [IU] | ORAL_CAPSULE | ORAL | 3 refills | Status: DC

## 2023-01-24 NOTE — Addendum Note (Signed)
Addended by: Elby Showers on: 01/24/2023 03:37 PM   Modules accepted: Level of Service

## 2023-02-14 DIAGNOSIS — H18452 Nodular corneal degeneration, left eye: Secondary | ICD-10-CM | POA: Diagnosis not present

## 2023-02-14 DIAGNOSIS — H04123 Dry eye syndrome of bilateral lacrimal glands: Secondary | ICD-10-CM | POA: Diagnosis not present

## 2023-02-14 DIAGNOSIS — Z83511 Family history of glaucoma: Secondary | ICD-10-CM | POA: Diagnosis not present

## 2023-02-14 DIAGNOSIS — H25813 Combined forms of age-related cataract, bilateral: Secondary | ICD-10-CM | POA: Diagnosis not present

## 2023-02-18 ENCOUNTER — Other Ambulatory Visit: Payer: Self-pay | Admitting: Cardiology

## 2023-03-19 ENCOUNTER — Other Ambulatory Visit: Payer: Self-pay | Admitting: Internal Medicine

## 2023-03-30 ENCOUNTER — Telehealth: Payer: Self-pay | Admitting: Internal Medicine

## 2023-03-30 NOTE — Telephone Encounter (Signed)
After talking with Dr Lenord Fellers schedule patient for an appointment for Monday afternoon.

## 2023-03-30 NOTE — Telephone Encounter (Signed)
Jeanne Smith [phone]  Jeanne Smith called to say she is having Blood pressure / heart papulations because of back pain that is running down into her buttocks and legs that  has been going on for at least a week and is progressively getting worse. She stated she can hardly walk. She is having to have someone help her. She is seeing Dr Magnus Ivan next week. She said her BP is running around 150/88 heart rate 101/102. She thinks she may need to go back on blood pressure medication olmesartan.

## 2023-04-02 ENCOUNTER — Encounter: Payer: Self-pay | Admitting: Physician Assistant

## 2023-04-02 ENCOUNTER — Encounter: Payer: Self-pay | Admitting: Internal Medicine

## 2023-04-02 ENCOUNTER — Ambulatory Visit (INDEPENDENT_AMBULATORY_CARE_PROVIDER_SITE_OTHER): Payer: Medicare Other | Admitting: Internal Medicine

## 2023-04-02 ENCOUNTER — Ambulatory Visit (INDEPENDENT_AMBULATORY_CARE_PROVIDER_SITE_OTHER): Payer: Medicare Other | Admitting: Physician Assistant

## 2023-04-02 VITALS — Temp 97.9°F | Ht 64.0 in | Wt 142.0 lb

## 2023-04-02 VITALS — Ht 64.0 in | Wt 142.0 lb

## 2023-04-02 DIAGNOSIS — R531 Weakness: Secondary | ICD-10-CM

## 2023-04-02 DIAGNOSIS — R7989 Other specified abnormal findings of blood chemistry: Secondary | ICD-10-CM

## 2023-04-02 DIAGNOSIS — R002 Palpitations: Secondary | ICD-10-CM | POA: Diagnosis not present

## 2023-04-02 DIAGNOSIS — I10 Essential (primary) hypertension: Secondary | ICD-10-CM

## 2023-04-02 DIAGNOSIS — R82998 Other abnormal findings in urine: Secondary | ICD-10-CM | POA: Diagnosis not present

## 2023-04-02 DIAGNOSIS — R29898 Other symptoms and signs involving the musculoskeletal system: Secondary | ICD-10-CM

## 2023-04-02 DIAGNOSIS — G8929 Other chronic pain: Secondary | ICD-10-CM

## 2023-04-02 DIAGNOSIS — M545 Low back pain, unspecified: Secondary | ICD-10-CM

## 2023-04-02 DIAGNOSIS — M25551 Pain in right hip: Secondary | ICD-10-CM | POA: Diagnosis not present

## 2023-04-02 DIAGNOSIS — N184 Chronic kidney disease, stage 4 (severe): Secondary | ICD-10-CM | POA: Diagnosis not present

## 2023-04-02 DIAGNOSIS — R319 Hematuria, unspecified: Secondary | ICD-10-CM | POA: Diagnosis not present

## 2023-04-02 DIAGNOSIS — R739 Hyperglycemia, unspecified: Secondary | ICD-10-CM | POA: Diagnosis not present

## 2023-04-02 LAB — POCT URINALYSIS DIPSTICK
Bilirubin, UA: NEGATIVE
Glucose, UA: NEGATIVE
Ketones, UA: NEGATIVE
Nitrite, UA: NEGATIVE
Protein, UA: POSITIVE — AB
Spec Grav, UA: 1.01 (ref 1.010–1.025)
Urobilinogen, UA: 0.2 E.U./dL
pH, UA: 6.5 (ref 5.0–8.0)

## 2023-04-02 NOTE — Progress Notes (Signed)
Office Visit Note   Patient: Jeanne Smith           Date of Birth: 1951/04/27           MRN: [id number] Visit Date: 04/02/2023              Requested by: Margaree Mackintosh, MD [address] [place],  [place] [address] PCP: Margaree Mackintosh, MD  Chief Complaint  Patient presents with   Lower Back - Pain      HPI: Xuan is a pleasant 72 year old woman who is a patient of Dr. Eliberto Ivory.  She has a history of a complicated hip replacement on the right.  She saw him back in August 2023 complaining of some low back pain and radicular symptoms.  He discussed getting an MRI however she was in the middle of treatment for adenocarcinoma for colorectal cancer.  She did complete the treatment has had not needed any adjuvant chemotherapy or radiation therapy.  She comes in today with a 1-1/2-week history of increasing weakness in her legs.  Prior to a week and a half ago she could ambulate and walk independently.  Now she is when she tries to stand on her legs they give way.  She also has paresthesias going down her legs.  She does not have loss of bowel or bladder control.  Assessment & Plan: Visit Diagnoses:  1. Chronic bilateral low back pain, unspecified whether sciatica present   2. Bilateral leg weakness     Plan: She has good sensation in her feet and good dorsiflexion and plantarflexion of her ankles without foot drop however she cannot stand because of weakness in both of her legs.  This is a significant change from a week and a half ago.  She also has paresthesias running down her legs.  I recommended a stat MRI with and without contrast considering her cancer diagnosis though I do not think this will be an issue.  She did have x-rays done in August which showed significant degenerative changes in her spine.  She knows that if she has any further progression or loss of bowel or bladder control she is to present to the emergency room.  She does not have a lot of pain describes it as  mild no history of fever or chills.  We have arranged for her to follow-up with Dr. Christell Constant on Friday  Follow-Up Instructions: Return with Dr. Christell Constant.   Ortho Exam  Patient is alert, oriented, no adenopathy, well-dressed, normal affect, normal respiratory effort. Examination patient is sitting in a wheelchair.  She cannot stand secondary to weakness.  Requires help to stand in the bathroom today.  She can actively dorsiflex and plantarflex both of her ankles.  She is able to sustain a straight leg raise but only for short period of time.  She cannot do active flexion of her hips no redness or erythema is noted  Imaging: No results found. No images are attached to the encounter.  Labs: Lab Results  Component Value Date   HGBA1C 5.6 01/22/2023   HGBA1C 5.2 07/24/2022   HGBA1C 5.0 01/13/2022   ESRSEDRATE 7 06/08/2017     Lab Results  Component Value Date   ALBUMIN 3.3 (L) 05/03/2022   ALBUMIN 3.9 12/22/2020   ALBUMIN 4.2 06/04/2017    Lab Results  Component Value Date   MG 2.0 05/17/2022   Lab Results  Component Value Date   VD25OH 39 01/22/2023   VD25OH 19 (L) 05/16/2016  No results found for: "PREALBUMIN"    Latest Ref Rng & Units 01/22/2023   10:52 AM 07/18/2022    9:18 AM 07/08/2022    4:25 AM  CBC EXTENDED  WBC 3.8 - 10.8 Thousand/uL 6.2  7.3  7.8   RBC 3.80 - 5.10 Million/uL 4.10  4.13  3.24   Hemoglobin 11.7 - 15.5 g/dL 16.1  09.6  04.5   HCT 35.0 - 45.0 % 39.8  41.4  34.1   Platelets 140 - 400 Thousand/uL 125  206  130   NEUT# 1,500 - 7,800 cells/uL 3,553  4,672    Lymph# 850 - 3,900 cells/uL 1,742  1,635       Body mass index is 24.37 kg/m.  Orders:  Orders Placed This Encounter  Procedures   MR LUMBAR SPINE W WO CONTRAST   No orders of the defined types were placed in this encounter.    Procedures: No procedures performed  Clinical Data: No additional findings.  ROS:  All other systems negative, except as noted in the HPI. Review of  Systems  Objective: Vital Signs: Ht 5\' 4"  (1.626 m)   Wt 142 lb (64.4 kg)   BMI 24.37 kg/m   Specialty Comments:  No specialty comments available.  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   Retained orthopedic hardware 06/15/2020   Stenosing tenosynovitis of wrist 04/01/2019   Closed fracture of right wrist 01/29/2019   Pain in right wrist 01/21/2019   Pain in right hand 01/21/2019   S/P left THA, AA 05/16/2018   S/P hip replacement 05/16/2018   Knee pain 03/28/2018   Pure hypercholesterolemia 12/24/2017   Intertrochanteric fracture of right femur (HCC) 06/15/2015   Comminuted right humeral fracture 06/15/2015   HTN (hypertension) 06/15/2015   Past Medical History:  Diagnosis Date   Anxiety    Arthritis    oa   Attention deficit disorder    Cancer [facility])    Dysrhythmia 2019   episode of palpitations x 25 January 2018, stress test normal   H/O seasonal allergies    Hyperlipidemia    Hypertension    Hypothyroidism    thyroid nodules, off synthroid > 15 years   Insomnia    Osteoarthritis    Osteopenia    Palpitations    Pneumonia    Thyroid nodule     Family History  Problem Relation Age of Onset   Heart failure Mother    Hypertension Father    Cancer Father        lung   Stroke Father    Heart disease Brother    Alcoholism Brother    Colon cancer Neg Hx    Pancreatic cancer Neg Hx    Esophageal cancer Neg Hx    Liver cancer Neg Hx    Stomach cancer Neg Hx     Past Surgical History:  Procedure Laterality Date   ABDOMINAL HYSTERECTOMY     AUGMENTATION MAMMAPLASTY Bilateral    silicone over 30 years   COLONOSCOPY     COLONOSCOPY WITH PROPOFOL N/A 05/25/2022   Procedure: COLONOSCOPY WITH PROPOFOL;  Surgeon: Lynann Bologna, MD;   Location: [facility];  Service: Gastroenterology;  Laterality: N/A;   CONVERSION TO TOTAL HIP Left 05/16/2018   Procedure:  LEFT HIP ANTERIOR APPROACH;  Surgeon: Durene Romans, MD;  Location: [facility];  Service: Orthopedics;  Laterality: Left;   FEMUR IM NAIL Right 06/16/2015   Procedure: INTRAMEDULLARY (IM) NAIL FEMORAL;  Surgeon: Sheral Apley, MD;  Location: [facility];  Service: Orthopedics;  Laterality: Right;   HARDWARE REMOVAL Right 09/24/2020   Procedure: REMOVAL OF IM ROD/HIP SCREW RIGHT HIP;  Surgeon: Kathryne Hitch, MD;  Location: [facility];  Service: Orthopedics;  Laterality: Right;   HARDWARE REMOVAL Right 12/24/2020   Procedure: HARDWARE REMOVAL RIGHT WRIST;  Surgeon: Tarry Kos, MD;  Location: [facility];  Service: Orthopedics;  Laterality: Right;   HIP SURGERY     JOINT REPLACEMENT     ORIF RADIAL FRACTURE Right 12/24/2020   Procedure: OPEN REDUCTION INTERNAL FIXATION (ORIF) RIGHT RADIAL SHAFT FRACTURE;  Surgeon: Tarry Kos, MD;  Location:  SURGERY CENTER;  Service: Orthopedics;  Laterality: Right;   POLYPECTOMY  05/25/2022   Procedure: POLYPECTOMY;  Surgeon: Lynann Bologna, MD;  Location: [facility];  Service: Gastroenterology;;   TONSILLECTOMY     TOTAL HIP ARTHROPLASTY Right 09/24/2020   Procedure: RIGHT TOTAL HIP ARTHROPLASTY ANTERIOR APPROACH;  Surgeon: Kathryne Hitch, MD;  Location: [facility];  Service: Orthopedics;  Laterality: Right;   TUBAL LIGATION     XI ROBOTIC ASSISTED LOWER ANTERIOR RESECTION N/A 07/05/2022   Procedure: XI ROBOTIC ASSISTED LOWER ANTERIOR RESECTION;  Surgeon: Romie Levee, MD;  Location: [facility];  Service: General;  Laterality: N/A;   Social History   Occupational History   Not on file  Tobacco Use   Smoking status: Former    Packs/day: .25    Types: Cigarettes    Quit date: 11/27/2005    Years since quitting: 17.3   Smokeless tobacco: Never  Vaping Use   Vaping Use: Never used  Substance and Sexual  Activity   Alcohol use: Yes    Alcohol/week: 14.0 standard drinks of alcohol    Types: 14 Glasses of wine per week    Comment: scotch or wine daily   Drug use: No   Sexual activity: Not on file

## 2023-04-03 ENCOUNTER — Ambulatory Visit (HOSPITAL_BASED_OUTPATIENT_CLINIC_OR_DEPARTMENT_OTHER)
Admission: RE | Admit: 2023-04-03 | Discharge: 2023-04-03 | Disposition: A | Discharge status: Home / Self Care | Payer: Medicare Other | Source: Ambulatory Visit | Attending: Internal Medicine | Admitting: Internal Medicine

## 2023-04-03 DIAGNOSIS — R7989 Other specified abnormal findings of blood chemistry: Secondary | ICD-10-CM | POA: Insufficient documentation

## 2023-04-03 DIAGNOSIS — N184 Chronic kidney disease, stage 4 (severe): Secondary | ICD-10-CM | POA: Diagnosis not present

## 2023-04-03 DIAGNOSIS — N179 Acute kidney failure, unspecified: Secondary | ICD-10-CM | POA: Diagnosis not present

## 2023-04-03 LAB — COMPLETE METABOLIC PANEL WITH GFR
AG Ratio: 1 (calc) (ref 1.0–2.5)
ALT: 305 U/L — ABNORMAL HIGH (ref 6–29)
AST: 808 U/L — ABNORMAL HIGH (ref 10–35)
Albumin: 3.2 g/dL — ABNORMAL LOW (ref 3.6–5.1)
Alkaline phosphatase (APISO): 145 U/L (ref 37–153)
BUN/Creatinine Ratio: 21 (calc) (ref 6–22)
BUN: 62 mg/dL — ABNORMAL HIGH (ref 7–25)
CO2: 20 mmol/L (ref 20–32)
Calcium: 9.5 mg/dL (ref 8.6–10.4)
Chloride: 101 mmol/L (ref 98–110)
Creat: 3 mg/dL — ABNORMAL HIGH (ref 0.60–1.00)
Globulin: 3.1 g/dL (calc) (ref 1.9–3.7)
Glucose, Bld: 95 mg/dL (ref 65–99)
Potassium: 3.1 mmol/L — ABNORMAL LOW (ref 3.5–5.3)
Sodium: 135 mmol/L (ref 135–146)
Total Bilirubin: 0.7 mg/dL (ref 0.2–1.2)
Total Protein: 6.3 g/dL (ref 6.1–8.1)
eGFR: 16 mL/min/{1.73_m2} — ABNORMAL LOW (ref >=60)

## 2023-04-03 LAB — URINE CULTURE
MICRO NUMBER:: 14917645
Result:: NO GROWTH
SPECIMEN QUALITY:: ADEQUATE

## 2023-04-03 LAB — CBC WITH DIFFERENTIAL/PLATELET
Absolute Monocytes: 1208 cells/uL — ABNORMAL HIGH (ref 200–950)
Basophils Absolute: 81 cells/uL (ref 0–200)
Basophils Relative: 0.7 %
Eosinophils Absolute: 127 cells/uL (ref 15–500)
Eosinophils Relative: 1.1 %
HCT: 37.2 % (ref 35.0–45.0)
Hemoglobin: 13.5 g/dL (ref 11.7–15.5)
Lymphs Abs: 1507 cells/uL (ref 850–3900)
MCH: 34.6 pg — ABNORMAL HIGH (ref 27.0–33.0)
MCHC: 36.3 g/dL — ABNORMAL HIGH (ref 32.0–36.0)
MCV: 95.4 fL (ref 80.0–100.0)
MPV: 13 fL — ABNORMAL HIGH (ref 7.5–12.5)
Monocytes Relative: 10.5 %
Neutro Abs: 8579 cells/uL — ABNORMAL HIGH (ref 1500–7800)
Neutrophils Relative %: 74.6 %
Platelets: 152 10*3/uL (ref 140–400)
RBC: 3.9 10*6/uL (ref 3.80–5.10)
RDW: 12.5 % (ref 11.0–15.0)
Total Lymphocyte: 13.1 %
WBC: 11.5 10*3/uL — ABNORMAL HIGH (ref 3.8–10.8)

## 2023-04-03 LAB — URINALYSIS, MICROSCOPIC ONLY: Hyaline Cast: NONE SEEN /LPF

## 2023-04-03 LAB — HEMOGLOBIN A1C
Hgb A1c MFr Bld: 5.7 % of total Hgb — ABNORMAL HIGH (ref <5.7)
Mean Plasma Glucose: 117 mg/dL
eAG (mmol/L): 6.5 mmol/L

## 2023-04-03 LAB — TSH: TSH: 1.56 mIU/L (ref 0.40–4.50)

## 2023-04-03 NOTE — Patient Instructions (Addendum)
Patient has elevated serum creatinine of 3. Ultrasound of kidneys has been ordered STAT. She will stop Benicar.  Addendum:- discussed with pt needs urgent evaluation for elevated LFTs and creatinine. Says she has neuro appt later this week

## 2023-04-03 NOTE — Progress Notes (Signed)
   Subjective:    Patient ID: Jeanne Smith, female    DOB: 1950-12-16, 72 y.o.   MRN: [id number]  HPI patient contacted [place] on May 3 saying she was having issues with blood pressure and heart palpitations because she was having some rather severe back pain running into the buttocks and legs for at least a week.  Stated she was having trouble ambulating and having to have some help getting around.  She has an appointment with Dr. Allie Bossier later today for evaluation.  She stated that her blood pressure had been running around 150/88 and pulse was running around 101-102.  She was thinking she needed to restart olmesartan.  She has been off of that.  Patient was last seen here in February 2024 for Medicare annual wellness visit.  Creatinine was elevated at 1.44 at that time and had been 1.12 in August 2023.  Had been as high as 1.40 in August 2023.  That was around the time she was hospitalized for colon cancer surgery by Dr. Romie Levee.  Then she started seeing Dr. Mancel Bale.  Had issue during the Summer 2023 with atrial fibrillation with RVR with rates in the 140s when she was having a colonoscopy at [facility] as an outpatient.  Potassium was low at that time at 3.1.  Creatinine then was 1.21.  It was felt that her A-fib was triggered by volume depletion.  She was given fluids.  Her colon cancer surgery occurred after that on July 25, 2022.  She saw Dr. Peter [place]azil[place] in early September for paroxysmal atrial fibrillation and hypercholesterolemia.  At time had no chest pain or arrhythmia.  Labs were stable.  Myoview study was negative.  History of grade 1 diastolic dysfunction.  Left atrium was severely dilated.  Diagnosed with atrial fib paroxysmal with CHA2DS2-VASc score of 3.  Was continued on Toprol-XL 25 mg daily.  Olmesartan was decreased from 40 to 10 mg daily and Toprol was prescribed.  Had Myoview study that was negative after having chest CT showing coronary and aortic atherosclerosis.   History of hyperlipidemia with LDL of 160 in September 2023 on Crestor 10 mg daily and dose was increased to 40 mg daily.  Right low back pain was occurring at that time with symptoms concerning for sciatica.  She had Medicare wellness visit in February and creatinine was 1.44 then. Was advised to stop Benicar.Was advised to recheck when well hydrated per OV note.This was not done.  Addendum: Renal ultrasound on May 7th shows no hydronephrosis or mass.    Review of Systems see above     Objective:   Physical Exam Seen in wheel chair in moderate amount of pain eager to see orthopedist today.Agitated due to back pain  VS reviewed Skin pale warm and dry Chest Clear. Cor: occasion irregular beat. No ectopy identified on EKG     Assessment & Plan:  Acute back pain to see orthopedist- may have lumbar disc or compression fracture  HTN- labs drawn  Hx rectal cancer followed by Oncology  Abnl urine specimen- may not be clean catch- sent for culture which proved to be negative  Mild impaired glucose tolerance  Elevated creatinine. Stop olmesartan. K is 3.1 BUN 62 ? Volume depletion  Elevated liver functions AST is 808 and ALT 305- needs evaluation has OT/PT split ? Liver disease Refer to ED for imaging

## 2023-04-05 ENCOUNTER — Telehealth: Payer: Self-pay | Admitting: Physician Assistant

## 2023-04-05 ENCOUNTER — Telehealth: Payer: Self-pay

## 2023-04-05 DIAGNOSIS — G8929 Other chronic pain: Secondary | ICD-10-CM

## 2023-04-05 NOTE — Telephone Encounter (Signed)
Patient states she is waiting on an MRI Stat still hasn't been given any information about the MRI. Please advise

## 2023-04-05 NOTE — Telephone Encounter (Signed)
Order changed.

## 2023-04-05 NOTE — Telephone Encounter (Signed)
Pt aware imaging has been trying to call and number was given for her to call back

## 2023-04-05 NOTE — Telephone Encounter (Signed)
Patient called asking for assistance with getting MRI schedule that was ordered by another doctor,  patient was informed that we could not get involved and to contact the ordering doctor.  Patient later called back and stated she has taken care of it.

## 2023-04-05 NOTE — Telephone Encounter (Signed)
Patient called in stating Per Dr. Marlan Palau she can only have MRI without Contrast please advise

## 2023-04-05 NOTE — Telephone Encounter (Signed)
noted 

## 2023-04-05 NOTE — Addendum Note (Signed)
Addended by: Barbette Or on: 04/05/2023 03:07 PM   Modules accepted: Orders

## 2023-04-06 ENCOUNTER — Ambulatory Visit: Payer: Medicare Other | Admitting: Orthopedic Surgery

## 2023-04-06 ENCOUNTER — Ambulatory Visit
Admission: RE | Admit: 2023-04-06 | Discharge: 2023-04-06 | Disposition: A | Discharge status: Home / Self Care | Payer: Medicare Other | Source: Ambulatory Visit | Attending: Physician Assistant | Admitting: Physician Assistant

## 2023-04-06 DIAGNOSIS — R29898 Other symptoms and signs involving the musculoskeletal system: Secondary | ICD-10-CM

## 2023-04-06 DIAGNOSIS — R531 Weakness: Secondary | ICD-10-CM | POA: Diagnosis not present

## 2023-04-06 DIAGNOSIS — M48061 Spinal stenosis, lumbar region without neurogenic claudication: Secondary | ICD-10-CM | POA: Diagnosis not present

## 2023-04-06 DIAGNOSIS — M5440 Lumbago with sciatica, unspecified side: Secondary | ICD-10-CM | POA: Diagnosis not present

## 2023-04-06 DIAGNOSIS — M545 Low back pain, unspecified: Secondary | ICD-10-CM

## 2023-04-09 ENCOUNTER — Other Ambulatory Visit: Payer: Medicare Other

## 2023-04-09 ENCOUNTER — Telehealth (INDEPENDENT_AMBULATORY_CARE_PROVIDER_SITE_OTHER): Payer: Medicare Other | Admitting: Internal Medicine

## 2023-04-09 ENCOUNTER — Encounter: Payer: Self-pay | Admitting: Internal Medicine

## 2023-04-09 DIAGNOSIS — M5416 Radiculopathy, lumbar region: Secondary | ICD-10-CM

## 2023-04-09 DIAGNOSIS — R7989 Other specified abnormal findings of blood chemistry: Secondary | ICD-10-CM

## 2023-04-09 LAB — BASIC METABOLIC PANEL
BUN/Creatinine Ratio: 20 (calc) (ref 6–22)
BUN: 43 mg/dL — ABNORMAL HIGH (ref 7–25)
CO2: 21 mmol/L (ref 20–32)
Calcium: 9.2 mg/dL (ref 8.6–10.4)
Chloride: 102 mmol/L (ref 98–110)
Creat: 2.15 mg/dL — ABNORMAL HIGH (ref 0.60–1.00)
Glucose, Bld: 143 mg/dL — ABNORMAL HIGH (ref 65–99)
Potassium: 3 mmol/L — ABNORMAL LOW (ref 3.5–5.3)
Sodium: 136 mmol/L (ref 135–146)

## 2023-04-09 NOTE — Telephone Encounter (Signed)
Patient has developed severe back pain recently.  Has severe lumbar radiculopathy and is confined to bed or wheelchair. Currently not on any NSAIDS.Urine specimen May 6th was WNL and urine culture negative.  Hx of rectal cancer thought to be in remission.  She is followed by Dr. Mancel Bale.  Recent CEA was normal at 4.23 in February.  She called regarding office visit for blood pressure elevation and palpitations on May 3 and was seen on May 6.  Had been seen at [facility] by [place] Persons, PA  on May 6th and was found to have mild lateral leg weakness and paresthesias.   Was seen here also on May 6 th with blood pressure issues and lumbar radiculopathy symptoms. Was found to have creatinine of 3.00 and was told to stop olmesartan. Had no obstruction on recent renal ultrasound. Is still on beta blocker. Says BP is labile due to back pain.   Has appt to see Dr. Marikay Alar this week for Neurosurgery consult. 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 Mild stenosis L3-L4.   On  May 6th white blood cell count was 11,500, hemoglobin 13.5 g and MCV 95.4  urine had large occult blood and trace LE. But urine culture unremarkable.  Creatinine was 3.00 and had been 1.44 on January 22, 2023.  BUN was 62.  Potassium was 3.1.  AST was 808 and ALT was 305. Not taking NSAIDS.  Has been on Crestor per Cardiology and needs to stop that as well.  B-met today improved to 2.15 after stopping olmesartan.  I have reviewed her chart again and contacted with her by telephone requesting that she go to [facility] for evaluation.  She is identified using 2 identifiers as Jeanne Smith, a patient in this practice.  She is agreeable to speaking with me in this format.  I have expressed concern about her lab findings in view of history of colon cancer and back pain.  Would benefit from evaluation at [facility].  Referral will be placed.   All  I am quite concerned about her and have relayed this to her. I think evaluation at [facility] is indicated to rule out obstruction of kidneys, investigate elevated LFTs and check labs. D/C olmesartan. See [facility] as soon as possible.Would like liver imaged at [facility]. [email or web address]

## 2023-04-10 ENCOUNTER — Other Ambulatory Visit: Payer: Self-pay

## 2023-04-10 ENCOUNTER — Emergency Department (HOSPITAL_BASED_OUTPATIENT_CLINIC_OR_DEPARTMENT_OTHER)
Admission: EM | Admit: 2023-04-10 | Discharge: 2023-04-10 | Disposition: A | Discharge status: Home / Self Care | Payer: Medicare Other | Attending: Emergency Medicine | Admitting: Emergency Medicine

## 2023-04-10 ENCOUNTER — Encounter (HOSPITAL_BASED_OUTPATIENT_CLINIC_OR_DEPARTMENT_OTHER): Payer: Self-pay

## 2023-04-10 ENCOUNTER — Emergency Department (HOSPITAL_BASED_OUTPATIENT_CLINIC_OR_DEPARTMENT_OTHER): Payer: Medicare Other

## 2023-04-10 DIAGNOSIS — R899 Unspecified abnormal finding in specimens from other organs, systems and tissues: Secondary | ICD-10-CM | POA: Insufficient documentation

## 2023-04-10 DIAGNOSIS — Z7982 Long term (current) use of aspirin: Secondary | ICD-10-CM | POA: Insufficient documentation

## 2023-04-10 DIAGNOSIS — R748 Abnormal levels of other serum enzymes: Secondary | ICD-10-CM | POA: Diagnosis not present

## 2023-04-10 DIAGNOSIS — R7401 Elevation of levels of liver transaminase levels: Secondary | ICD-10-CM | POA: Diagnosis not present

## 2023-04-10 LAB — CBC WITH DIFFERENTIAL/PLATELET
Abs Immature Granulocytes: 0.03 10*3/uL (ref 0.00–0.07)
Basophils Absolute: 0.1 10*3/uL (ref 0.0–0.1)
Basophils Relative: 1 %
Eosinophils Absolute: 0.2 10*3/uL (ref 0.0–0.5)
Eosinophils Relative: 2 %
HCT: 36.4 % (ref 36.0–46.0)
Hemoglobin: 12 g/dL (ref 12.0–15.0)
Immature Granulocytes: 0 %
Lymphocytes Relative: 16 %
Lymphs Abs: 1.7 10*3/uL (ref 0.7–4.0)
MCH: 32.9 pg (ref 26.0–34.0)
MCHC: 33 g/dL (ref 30.0–36.0)
MCV: 99.7 fL (ref 80.0–100.0)
Monocytes Absolute: 0.9 10*3/uL (ref 0.1–1.0)
Monocytes Relative: 8 %
Neutro Abs: 7.7 10*3/uL (ref 1.7–7.7)
Neutrophils Relative %: 73 %
Platelets: 157 10*3/uL (ref 150–400)
RBC: 3.65 MIL/uL — ABNORMAL LOW (ref 3.87–5.11)
RDW: 13.3 % (ref 11.5–15.5)
WBC: 10.5 10*3/uL (ref 4.0–10.5)
nRBC: 0 % (ref 0.0–0.2)

## 2023-04-10 LAB — URINALYSIS, ROUTINE W REFLEX MICROSCOPIC
Bilirubin Urine: NEGATIVE
Glucose, UA: NEGATIVE mg/dL
Ketones, ur: NEGATIVE mg/dL
Leukocytes,Ua: NEGATIVE
Nitrite: NEGATIVE
Protein, ur: 100 mg/dL — AB
Specific Gravity, Urine: 1.012 (ref 1.005–1.030)
pH: 6 (ref 5.0–8.0)

## 2023-04-10 LAB — CREATININE, URINE, RANDOM: Creatinine, Urine: 58 mg/dL

## 2023-04-10 LAB — COMPREHENSIVE METABOLIC PANEL
ALT: 228 U/L — ABNORMAL HIGH (ref 0–44)
AST: 491 U/L — ABNORMAL HIGH (ref 15–41)
Albumin: 3.3 g/dL — ABNORMAL LOW (ref 3.5–5.0)
Alkaline Phosphatase: 146 U/L — ABNORMAL HIGH (ref 38–126)
Anion gap: 11 (ref 5–15)
BUN: 42 mg/dL — ABNORMAL HIGH (ref 8–23)
CO2: 23 mmol/L (ref 22–32)
Calcium: 9.8 mg/dL (ref 8.9–10.3)
Chloride: 103 mmol/L (ref 98–111)
Creatinine, Ser: 2.06 mg/dL — ABNORMAL HIGH (ref 0.44–1.00)
GFR, Estimated: 25 mL/min — ABNORMAL LOW (ref >60)
Glucose, Bld: 125 mg/dL — ABNORMAL HIGH (ref 70–99)
Potassium: 3.3 mmol/L — ABNORMAL LOW (ref 3.5–5.1)
Sodium: 137 mmol/L (ref 135–145)
Total Bilirubin: 0.7 mg/dL (ref 0.3–1.2)
Total Protein: 6.6 g/dL (ref 6.5–8.1)

## 2023-04-10 LAB — NA AND K (SODIUM & POTASSIUM), RAND UR
Potassium Urine: 25 mmol/L
Sodium, Ur: <10 mmol/L

## 2023-04-10 LAB — MAGNESIUM: Magnesium: 1.8 mg/dL (ref 1.7–2.4)

## 2023-04-10 NOTE — ED Triage Notes (Signed)
Pt advises PCP referred her for "renal & liver workup & maybe IV hydration" r/t recently obtained bloodwork. Recent MRI lumbar area & renal US that were normal. Hx rectal cancer, states she's in remission.

## 2023-04-10 NOTE — Discharge Instructions (Signed)
Please follow up with your primary care doctor.  Please avoid drinking alcohol.  Your doctor's office will need to continue to check your liver enzymes as well as your kidney function.  Continue to drink plenty of water at home.  Your doctor may consider referral to see a gastroenterologist if you do not have one already, to have your liver abnormalities further worked up.  You may require further testing including scans of the abdomen or MRI scan.  If you begin having worsening pain in your abdomen, persistent vomiting, yellowing of the skin of the eyes, confusion, or any other emergency concerns, please return to the ER immediately.  These may be signs of worsening liver function or kidney failure.

## 2023-04-10 NOTE — ED Provider Notes (Signed)
[place] EMERGENCY DEPARTMENT AT [facility] Provider Note   CSN: [id number] Arrival date & time: 04/10/23  1426     History  Chief Complaint  Patient presents with   Abnormal Lab    Jeanne Smith is a 72 y.o. 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  The patient reports that her PCP has been checking her blood over the past week, she was noted to have new AKI with elevation of her BUN and creatinine, as well as a transaminitis.  She reports that she was not drinking a lot of water beforehand but has been aggressively drinking water this week at the request of her doctor.  Her doctor recheck her labs yesterday and there was improvement of her creatinine from 3.0 -> 2.1, but she was still recommended to come to the ED today for evaluation for her transaminitis.  Her doctor had wondered whether the patient may be liver imaging.  The patient reports she has been eating normally.  She denies nausea, vomiting, jaundice.  She reports he has 1 regular bowel movement a day.  Denies persistent diarrhea.  She states only change in her medications her doctor took her off of the olmesartan because her blood pressure was doing fine and she was not needing it.  She is not on any diuretics.  She has chronic low back pain for which she sees a neurosurgeon and baseline weakness in her lower legs  Patient has no abdominal pain  HPI     Home Medications Prior to Admission medications   Medication Sig Start Date End Date Taking? Authorizing Provider  ALPRAZolam Prudy Feeler) 0.5 MG tablet Take 0.5 mg by mouth as needed for anxiety.    [provider]  aspirin 81 MG chewable tablet Chew 1 tablet (81 mg total) by mouth 2 (two) times daily. Patient taking differently: Chew 81 mg by mouth in the morning. 09/25/20   Kathryne Hitch, MD  b complex vitamins tablet Take 1  tablet by mouth daily.    [provider]  docusate sodium (COLACE) 100 MG capsule Take 100 mg by mouth daily.    [provider]  Doxylamine Succinate, Sleep, (SLEEP AID PO) Take 1 tablet by mouth at bedtime.    [provider]  estradiol (ESTRACE) 2 MG tablet Take 2 mg by mouth at bedtime.  04/21/19   [provider]  Hypromellose, PF, (RETAINE HPMC) 0.3 % SOLN Place 1 drop into both eyes 3 (three) times daily as needed (for dryness).    [provider]  Magnesium 250 MG TABS Take 250 mg by mouth at bedtime.    [provider]  metoprolol succinate (TOPROL XL) 25 MG 24 hr tablet Take 1 tablet (25 mg total) by mouth daily. 06/12/22   [place], Peter M, MD  Multiple Vitamins-Minerals (MULTIVITAMIN WITH MINERALS) tablet Take 1 tablet by mouth daily.    [provider]  olmesartan (BENICAR) 20 MG tablet Take 0.5 tablets (10 mg total) by mouth daily. Patient not taking: Reported on 04/02/2023 06/12/22   [place], Peter M, MD  Omega-3 Fatty Acids (EQL OMEGA 3 FISH OIL PO) Take 565 mg by mouth daily.    [provider]  polyethylene glycol (MIRALAX / GLYCOLAX) packet Take 17 g by mouth 2 (two) times daily. Patient taking differently: Take 17 g by mouth daily as needed for moderate constipation.  05/17/18   Lanney Gins, PA-C  potassium chloride SA (KLOR-CON M) 20 MEQ tablet Take 1 tablet (20 mEq total) by mouth 2 (two) times daily. Patient not taking: Reported on 01/23/2023 07/28/22   [place], Peter M, MD  rosuvastatin (CRESTOR) 40 MG tablet TAKE 1 TABLET(40 MG) BY MOUTH DAILY 02/19/23   [place]and, Peter M, MD  Saline 0.9 % AERS Place 1 spray into both nostrils 2 (two) times daily as needed (congestion).     [provider]  triamcinolone 0.1% oint-Eucerin equivalent cream 1:1 mixture Apply topically daily as needed. Patient not taking: Reported on 04/02/2023 01/24/23   Margaree Mackintosh, MD  Vitamin D, Ergocalciferol, (DRISDOL) 1.25 MG (50000  UNIT) CAPS capsule Take 1 capsule (50,000 Units total) by mouth every 7 (seven) days. 01/24/23   Margaree Mackintosh, MD      Allergies    Patient has no known allergies.    Review of Systems   Review of Systems  Physical Exam Updated Vital Signs BP 121/60 (BP Location: Right Arm)   Pulse 86   Temp 98.2 F (36.8 C) (Oral)   Resp 14   SpO2 99%  Physical Exam Constitutional:      General: She is not in acute distress. HENT:     Head: Normocephalic and atraumatic.  Eyes:     Conjunctiva/sclera: Conjunctivae normal.     Pupils: Pupils are equal, round, and reactive to light.  Cardiovascular:     Rate and Rhythm: Normal rate and regular rhythm.  Pulmonary:     Effort: Pulmonary effort is normal. No respiratory distress.  Abdominal:     General: There is no distension.     Tenderness: There is no abdominal tenderness.  Skin:    General: Skin is warm and dry.  Neurological:     Mental Status: She is alert and oriented to person, place, and time. Mental status is at baseline.     Comments: Patient has baseline weakness in her lower legs     ED Results / Procedures / Treatments   Labs (all labs ordered are listed, but only abnormal results are displayed) Labs Reviewed  CBC WITH DIFFERENTIAL/PLATELET - Abnormal; Notable for the following components:      Result Value   RBC 3.65 (*)    All other components within normal limits  COMPREHENSIVE METABOLIC PANEL - Abnormal; Notable for the following components:   Potassium 3.3 (*)    Glucose, Bld 125 (*)    BUN 42 (*)    Creatinine, Ser 2.06 (*)    Albumin 3.3 (*)    AST 491 (*)    ALT 228 (*)    Alkaline Phosphatase 146 (*)    GFR, Estimated 25 (*)    All other components within normal limits  URINALYSIS, ROUTINE W REFLEX MICROSCOPIC - Abnormal; Notable for the following components:   Color, Urine COLORLESS (*)    Hgb urine dipstick LARGE (*)    Protein, ur 100 (*)    Bacteria, UA RARE (*)    All other components within  normal limits  MAGNESIUM  NA AND K (SODIUM & POTASSIUM), RAND UR  CREATININE, URINE, RANDOM    EKG None  Radiology US Abdomen Limited RUQ (LIVER/GB)  Result Date: 04/10/2023 CLINICAL DATA:  Transaminitis. History of rectal adenocarcinoma. In remission. EXAM: ULTRASOUND ABDOMEN LIMITED RIGHT UPPER QUADRANT COMPARISON:  CT chest, abdomen, and pelvis 05/12/2022; right upper quadrant abdominal ultrasound 05/27/2020 FINDINGS: Gallbladder: No gallstones or wall thickening visualized. No sonographic  Murphy sign noted by sonographer. Common bile duct: Diameter: 7.5 mm, at the upper limits of normal and unchanged from 05/27/2020 ultrasound. The pancreatic duct measures approximately 5 mm, mildly dilated. This was not previously imaged on ultrasound. No intrahepatic biliary ductal dilatation is seen. Liver: There is a mildly nodular liver contour as seen on prior CT. Portal vein is patent on color Doppler imaging with normal direction of blood flow towards the liver. Other: None. IMPRESSION: 1. Mildly nodular liver contour, suggestive of cirrhosis. 2. Common bile duct caliber at the upper limits of normal and unchanged from 05/27/2020 ultrasound. 3. Mildly dilated pancreatic duct. If clinically indicated, this can be further evaluated with MRCP. Electronically Signed   By: Neita Garnet M.D.   On: 04/10/2023 18:23    Procedures Procedures    Medications Ordered in ED Medications - No data to display  ED Course/ Medical Decision Making/ A&P                             Medical Decision Making Amount and/or Complexity of Data Reviewed Labs: ordered. Radiology: ordered.   This patient presents to the ED with concern for transaminitis, AKI. This involves an extensive number of treatment options, and is a complaint that carries with it a high risk of complications and morbidity.  The differential diagnosis includes   - AKI: Per my review of the medical records this appears to be prerenal pattern  with significant elevated BUN and creatinine.  With the patient aggressively drinking fluids at home and her kidney function is improved, and her doctor also took her off of olmesartan.  I do not see indication for IV fluids at this time.  I do think it is reasonable to check Ua and urine lytes for continued outpatient workup.    - Transaminitis -differential would include acute biliary disease versus liver metastasis versus medication side effect versus other.  No sig alcohol consumption or reported hx of cirrhosis.  No abd ttp on exam, neg murphy sign, no nausea/vomiting.  Overall transaminitis appears to be stable or even somewhat improving over the past 7 days.  RUQ ultrasound ordered.  Co-morbidities that complicate the patient evaluation: History of colon cancer at high risk of metastasis  External records from outside source obtained and reviewed including outpatient PCP labs this week.  I ordered and personally interpreted labs.  The pertinent results include:  Bun 42, Cr 2.06 (improved from yesterday 2.1), UA with hemoglobin and protein, CBC unremarkable, LFT's elevated but stable, Tbili wnl  I ordered imaging studies including RUQ ultrasound I independently visualized and interpreted imaging which showed no acute emergent findings regarding the gallbladder.  Common bile duct measured 7.5 mm which is within normal limits for this patient's age.  Pancreatic duct with mild dilatation, nonspecific finding. I agree with the radiologist interpretation  The patient was maintained on a cardiac monitor.  I personally viewed and interpreted the cardiac monitored which showed an underlying rhythm of: NSR  I have reviewed the patients home medicines and have made adjustments as needed  Test Considered: No indication for emergent CT imaging of the abdomen or colonoscopy at this time.  After the interventions noted above, I reevaluated the patient and found that they have: stayed the same  Social  Determinants of Health: patient strongly encouraged to stop drinking alcohol.  She continues to drink well 1-2 mixed drinks at night, and has been drinking for a long time.  She does not have any issues with alcohol withdrawal.  She reports that she will do so.  She understands that this may be related to her liver dysfunction  She does not have T. bili elevation to suggest an acute bile duct obstruction, or persistent abdominal pain or vomiting.  Dispostion:  After consideration of the diagnostic results and the patients response to treatment, I feel that the patent would benefit from close PCP outpatient follow-up.  The patient understands that she needs to have her blood test rechecked this week by her doctor, including her kidney function and her liver enzymes.  She is advised to discontinue the Crestor medication as per my review of her doctor's note today.  She is also advised to discontinue drinking alcohol.  She verbalizes understanding.         Final Clinical Impression(s) / ED Diagnoses Final diagnoses:  Transaminitis  Abnormal laboratory test    Rx / DC Orders ED Discharge Orders     None         Terald Sleeper, MD 04/10/23 1850

## 2023-04-10 NOTE — ED Notes (Signed)
Pt verbalized understanding of d/c instructions, meds, and followup care. Denies questions. VSS, no distress noted. Steady gait to exit with all belongings.  ?

## 2023-04-12 DIAGNOSIS — R29898 Other symptoms and signs involving the musculoskeletal system: Secondary | ICD-10-CM | POA: Diagnosis not present

## 2023-04-12 DIAGNOSIS — M5136 Other intervertebral disc degeneration, lumbar region: Secondary | ICD-10-CM | POA: Diagnosis not present

## 2023-04-12 DIAGNOSIS — M48062 Spinal stenosis, lumbar region with neurogenic claudication: Secondary | ICD-10-CM | POA: Diagnosis not present

## 2023-04-15 ENCOUNTER — Encounter: Payer: Self-pay | Admitting: Internal Medicine

## 2023-04-16 ENCOUNTER — Ambulatory Visit (INDEPENDENT_AMBULATORY_CARE_PROVIDER_SITE_OTHER): Payer: Medicare Other | Admitting: Internal Medicine

## 2023-04-16 ENCOUNTER — Other Ambulatory Visit: Payer: Medicare Other

## 2023-04-16 ENCOUNTER — Encounter: Payer: Self-pay | Admitting: Internal Medicine

## 2023-04-16 VITALS — BP 132/76 | HR 81 | Temp 98.3°F | Ht 64.0 in | Wt 145.0 lb

## 2023-04-16 DIAGNOSIS — R002 Palpitations: Secondary | ICD-10-CM

## 2023-04-16 DIAGNOSIS — M5416 Radiculopathy, lumbar region: Secondary | ICD-10-CM | POA: Diagnosis not present

## 2023-04-16 DIAGNOSIS — I10 Essential (primary) hypertension: Secondary | ICD-10-CM

## 2023-04-16 DIAGNOSIS — R531 Weakness: Secondary | ICD-10-CM | POA: Diagnosis not present

## 2023-04-16 DIAGNOSIS — Z8679 Personal history of other diseases of the circulatory system: Secondary | ICD-10-CM | POA: Diagnosis not present

## 2023-04-16 DIAGNOSIS — R7989 Other specified abnormal findings of blood chemistry: Secondary | ICD-10-CM

## 2023-04-16 DIAGNOSIS — E876 Hypokalemia: Secondary | ICD-10-CM

## 2023-04-16 DIAGNOSIS — R600 Localized edema: Secondary | ICD-10-CM | POA: Diagnosis not present

## 2023-04-16 DIAGNOSIS — C189 Malignant neoplasm of colon, unspecified: Secondary | ICD-10-CM | POA: Diagnosis not present

## 2023-04-16 LAB — CBC WITH DIFFERENTIAL/PLATELET
Absolute Monocytes: 590 cells/uL (ref 200–950)
Eosinophils Absolute: 281 cells/uL (ref 15–500)
Hemoglobin: 11.3 g/dL — ABNORMAL LOW (ref 11.7–15.5)
MPV: 12.2 fL (ref 7.5–12.5)
RBC: 3.39 10*6/uL — ABNORMAL LOW (ref 3.80–5.10)
Total Lymphocyte: 21 %

## 2023-04-16 MED ORDER — FUROSEMIDE 20 MG PO TABS
20.0000 mg | ORAL_TABLET | Freq: Every day | ORAL | 1 refills | Status: DC
Start: 2023-04-16 — End: 2023-06-11

## 2023-04-16 NOTE — Progress Notes (Signed)
Patient Care Team: Margaree Mackintosh, MD as PCP - General [place], Peter M, MD as PCP - Cardiology (Cardiology)  Visit Date: 04/16/23  Subjective:    Patient ID: Jeanne Smith , Female   DOB: [birth year], 72 y.o.    MRN: [id number]   72 y.o. Female presents today for a hospital/ED follow-up. Seen in ED on 04/10/23 with concern for transaminitis, acute kidney injury. BUN 42, Cr 2.06, AST 491, ALT 228. RUQ ultrasounds showed no acute emergent findings regarding gallbladder, pancreatic duct with mild dilation, common bile duct within normal limits. Cardiac monitor showed NSR. She has stopped rosuvastatin. Appetite is normal. Notes swelling in ankles/feet since 04/08/23. Has been elevating legs. Taking ibuprofen for lumbar back pain. Having some difficulty with daily tasks like putting on pants due to weakness in both legs. Orthopedist, Dr. Christell Constant, recommended stat MRI with and without contrast. Also has stopped olmesartan with elevated creatinine. Saw Neurosurgeon, Dr. Marikay Alar, about back pain and radiculopathy on May 16th. MRI reviewed by Dr. Yetta Barre noting significant canal stenosis at L4-L5. Has disc dessication with mild Modic changes and loss of disc space L2-L3 without stenosis. Contrast cannot be given with MRI at this time due to elevated serum creatinine. This was repeated today with hydration. A consult with [facility] has been placed.  History of rectal adenocarcinoma stable at present followed by Dr. Truett Perna.  Past Medical History:  Diagnosis Date   Anxiety    Arthritis    oa   Attention deficit disorder    Cancer [facility])    Dysrhythmia 2019   episode of palpitations x 25 January 2018, stress test normal   H/O seasonal allergies    Hyperlipidemia    Hypertension    Hypothyroidism    thyroid nodules, off synthroid > 15 years   Insomnia    Osteoarthritis    Osteopenia    Palpitations    Pneumonia    Thyroid nodule      Family History  Problem Relation Age of Onset    Heart failure Mother    Hypertension Father    Cancer Father        lung   Stroke Father    Heart disease Brother    Alcoholism Brother    Colon cancer Neg Hx    Pancreatic cancer Neg Hx    Esophageal cancer Neg Hx    Liver cancer Neg Hx    Stomach cancer Neg Hx     Accompanied by significant other [employer] today.  CBC shows Hgb 11.3 grams with normal MCV. WBC 6700. C-met is still pending at this time.   Review of Systems  Constitutional:  Negative for fever and malaise/fatigue.  HENT:  Negative for congestion.   Eyes:  Negative for blurred vision.  Respiratory:  Negative for cough and shortness of breath.   Cardiovascular:  Negative for chest pain, palpitations and leg swelling.  Gastrointestinal:  Negative for vomiting.  Musculoskeletal:  Negative for back pain.  Skin:  Negative for rash.  Neurological:  Positive for weakness (Legs). Negative for loss of consciousness and headaches.        Objective:   Vitals: BP 132/76   Pulse 81   Temp 98.3 F (36.8 C) (Tympanic)   Ht 5\' 4"  (1.626 m)   Wt 145 lb (65.8 kg)   SpO2 99%   BMI 24.89 kg/m    Physical Exam Vitals and nursing note reviewed.  Constitutional:      General: She  is not in acute distress.    Appearance: Normal appearance. She is not toxic-appearing.  HENT:     Head: Normocephalic and atraumatic.  Cardiovascular:     Rate and Rhythm: Normal rate and regular rhythm. No extrasystoles are present.    Pulses: Normal pulses.     Heart sounds: Normal heart sounds. No murmur heard.    No friction rub. No gallop.  Pulmonary:     Effort: Pulmonary effort is normal. No respiratory distress.     Breath sounds: Normal breath sounds. No wheezing or rales.  Skin:    General: Skin is warm and dry.  Neurological:     Mental Status: She is alert and oriented to person, place, and time. Mental status is at baseline.  Psychiatric:        Mood and Affect: Mood normal.        Behavior: Behavior normal.         Thought Content: Thought content normal.        Judgment: Judgment normal.       Results:   Studies obtained and personally reviewed by me:   Labs:       Component Value Date/Time   NA 137 04/10/2023 1456   K 3.3 (L) 04/10/2023 1456   CL 103 04/10/2023 1456   CO2 23 04/10/2023 1456   GLUCOSE 125 (H) 04/10/2023 1456   BUN 42 (H) 04/10/2023 1456   CREATININE 2.06 (H) 04/10/2023 1456   CREATININE 2.15 (H) 04/09/2023 1237   CALCIUM 9.8 04/10/2023 1456   PROT 6.6 04/10/2023 1456   ALBUMIN 3.3 (L) 04/10/2023 1456   AST 491 (H) 04/10/2023 1456   ALT 228 (H) 04/10/2023 1456   ALKPHOS 146 (H) 04/10/2023 1456   BILITOT 0.7 04/10/2023 1456   GFRNONAA 25 (L) 04/10/2023 1456   GFRNONAA 75 01/17/2021 0923   GFRAA 87 01/17/2021 0923     Lab Results  Component Value Date   WBC 10.5 04/10/2023   HGB 12.0 04/10/2023   HCT 36.4 04/10/2023   MCV 99.7 04/10/2023   PLT 157 04/10/2023    Lab Results  Component Value Date   CHOL 153 01/22/2023   HDL 76 01/22/2023   LDLCALC 56 01/22/2023   TRIG 133 01/22/2023   CHOLHDL 2.0 01/22/2023    Lab Results  Component Value Date   HGBA1C 5.7 (H) 04/02/2023     Lab Results  Component Value Date   TSH 1.56 04/02/2023      Assessment & Plan:   Lower extremity edema: start furosemide 20 mg  by mouth daily .Follow up in about 2 weeks  Elevated kidney functions: Ordered BMET. Renal consult requested. Creatinine 2.06 on May 14  Lumbar Radiculopathy- improved with conservative treatment  Hx of colon cancer followed by Dr. Truett Perna, Oncology and Dr. Orvan Falconer, GI. Had  robotic assisted lower anterior resection August 2023 by Dr. Romie Levee. Moderately well differentiated adenocarcinoma of the colon with clear margins.  Hx A-fib with RVR followed by Dr. Peter [place]  HTN- stable off  Benicar  Hx hip fracture July 2016 and hip arthroplasty done on same hip January 2022.  Hypokalemia- noted May 14 and repeated today   Persistent  Elevated liver transaminases with SGOT/SGPT split May 14th improved from May 6th. Have been repeated today and results pending   Plan: Await lab results. Await Consult with [facility]. She is not having back pain at presetn and look improved and better hydrated clinically.     I,Alexander  Ruley,acting as a Neurosurgeon for Margaree Mackintosh, MD.,have documented all relevant documentation on the behalf of Margaree Mackintosh, MD,as directed by  Margaree Mackintosh, MD while in the presence of Margaree Mackintosh, MD.   I, Margaree Mackintosh, MD, have reviewed all documentation for this visit. The documentation on 04/16/23 for the exam, diagnosis, procedures, and orders are all accurate and complete.

## 2023-04-16 NOTE — Patient Instructions (Addendum)
Basic metabolic panel obtained. Renal consult requested. Will review labs and make suggestions. Start with Lasix 20 mg daily for LE edema. Further instructions to follow pending lab results. Likely follow up in 2-3 weeks.

## 2023-04-17 LAB — CBC WITH DIFFERENTIAL/PLATELET
Basophils Absolute: 80 cells/uL (ref 0–200)
Basophils Relative: 1.2 %
Eosinophils Relative: 4.2 %
HCT: 33.1 % — ABNORMAL LOW (ref 35.0–45.0)
Lymphs Abs: 1407 cells/uL (ref 850–3900)
MCH: 33.3 pg — ABNORMAL HIGH (ref 27.0–33.0)
MCHC: 34.1 g/dL (ref 32.0–36.0)
MCV: 97.6 fL (ref 80.0–100.0)
Monocytes Relative: 8.8 %
Neutro Abs: 4342 cells/uL (ref 1500–7800)
Neutrophils Relative %: 64.8 %
Platelets: 137 10*3/uL — ABNORMAL LOW (ref 140–400)
RDW: 11.8 % (ref 11.0–15.0)
WBC: 6.7 10*3/uL (ref 3.8–10.8)

## 2023-04-17 LAB — COMPLETE METABOLIC PANEL WITH GFR
AG Ratio: 1.1 (calc) (ref 1.0–2.5)
ALT: 114 U/L — ABNORMAL HIGH (ref 6–29)
AST: 135 U/L — ABNORMAL HIGH (ref 10–35)
Albumin: 3 g/dL — ABNORMAL LOW (ref 3.6–5.1)
Alkaline phosphatase (APISO): 133 U/L (ref 37–153)
BUN/Creatinine Ratio: 17 (calc) (ref 6–22)
BUN: 29 mg/dL — ABNORMAL HIGH (ref 7–25)
CO2: 23 mmol/L (ref 20–32)
Calcium: 9.2 mg/dL (ref 8.6–10.4)
Chloride: 108 mmol/L (ref 98–110)
Creat: 1.68 mg/dL — ABNORMAL HIGH (ref 0.60–1.00)
Globulin: 2.7 g/dL (calc) (ref 1.9–3.7)
Glucose, Bld: 126 mg/dL — ABNORMAL HIGH (ref 65–99)
Potassium: 3.7 mmol/L (ref 3.5–5.3)
Sodium: 141 mmol/L (ref 135–146)
Total Bilirubin: 0.6 mg/dL (ref 0.2–1.2)
Total Protein: 5.7 g/dL — ABNORMAL LOW (ref 6.1–8.1)
eGFR: 32 mL/min/{1.73_m2} — ABNORMAL LOW (ref >=60)

## 2023-04-25 DIAGNOSIS — I129 Hypertensive chronic kidney disease with stage 1 through stage 4 chronic kidney disease, or unspecified chronic kidney disease: Secondary | ICD-10-CM | POA: Diagnosis not present

## 2023-04-25 DIAGNOSIS — N179 Acute kidney failure, unspecified: Secondary | ICD-10-CM | POA: Diagnosis not present

## 2023-04-25 DIAGNOSIS — N1832 Chronic kidney disease, stage 3b: Secondary | ICD-10-CM | POA: Diagnosis not present

## 2023-04-25 DIAGNOSIS — R799 Abnormal finding of blood chemistry, unspecified: Secondary | ICD-10-CM | POA: Diagnosis not present

## 2023-04-25 DIAGNOSIS — R600 Localized edema: Secondary | ICD-10-CM | POA: Diagnosis not present

## 2023-05-01 DIAGNOSIS — M47814 Spondylosis without myelopathy or radiculopathy, thoracic region: Secondary | ICD-10-CM | POA: Diagnosis not present

## 2023-05-01 DIAGNOSIS — R29898 Other symptoms and signs involving the musculoskeletal system: Secondary | ICD-10-CM | POA: Diagnosis not present

## 2023-05-01 DIAGNOSIS — M47812 Spondylosis without myelopathy or radiculopathy, cervical region: Secondary | ICD-10-CM | POA: Diagnosis not present

## 2023-05-01 DIAGNOSIS — M40204 Unspecified kyphosis, thoracic region: Secondary | ICD-10-CM | POA: Diagnosis not present

## 2023-05-01 DIAGNOSIS — M4312 Spondylolisthesis, cervical region: Secondary | ICD-10-CM | POA: Diagnosis not present

## 2023-05-07 DIAGNOSIS — M5417 Radiculopathy, lumbosacral region: Secondary | ICD-10-CM | POA: Diagnosis not present

## 2023-05-15 DIAGNOSIS — R29898 Other symptoms and signs involving the musculoskeletal system: Secondary | ICD-10-CM | POA: Diagnosis not present

## 2023-05-28 DIAGNOSIS — M48062 Spinal stenosis, lumbar region with neurogenic claudication: Secondary | ICD-10-CM | POA: Diagnosis not present

## 2023-06-09 ENCOUNTER — Other Ambulatory Visit: Payer: Self-pay | Admitting: Cardiology

## 2023-06-11 ENCOUNTER — Other Ambulatory Visit: Payer: Self-pay | Admitting: Internal Medicine

## 2023-06-18 DIAGNOSIS — L710 Perioral dermatitis: Secondary | ICD-10-CM | POA: Diagnosis not present

## 2023-06-18 DIAGNOSIS — L2089 Other atopic dermatitis: Secondary | ICD-10-CM | POA: Diagnosis not present

## 2023-06-27 DIAGNOSIS — R600 Localized edema: Secondary | ICD-10-CM | POA: Diagnosis not present

## 2023-06-27 DIAGNOSIS — M1990 Unspecified osteoarthritis, unspecified site: Secondary | ICD-10-CM | POA: Diagnosis not present

## 2023-06-27 DIAGNOSIS — N179 Acute kidney failure, unspecified: Secondary | ICD-10-CM | POA: Diagnosis not present

## 2023-06-27 DIAGNOSIS — N1832 Chronic kidney disease, stage 3b: Secondary | ICD-10-CM | POA: Diagnosis not present

## 2023-06-27 DIAGNOSIS — I129 Hypertensive chronic kidney disease with stage 1 through stage 4 chronic kidney disease, or unspecified chronic kidney disease: Secondary | ICD-10-CM | POA: Diagnosis not present

## 2023-06-27 DIAGNOSIS — E876 Hypokalemia: Secondary | ICD-10-CM | POA: Diagnosis not present

## 2023-07-17 ENCOUNTER — Telehealth: Payer: Self-pay | Admitting: Internal Medicine

## 2023-07-17 NOTE — Telephone Encounter (Signed)
Called and spoke with Jeanne Smith she is taking the furosemide 20 mg 4 times a week sine 06/27/2023.

## 2023-07-17 NOTE — Telephone Encounter (Signed)
Jeanne Smith is going to go ahead and take a metoprolol now and then start taking them in the mornings, she will call and keep me informed on how her blood pressure is doing.

## 2023-07-17 NOTE — Telephone Encounter (Signed)
Jeanne Smith [phone]  Darel Hong called to say she is having issues with her blood pressure being elevated as they day goes on. She takes her metoprolol in the evening. She stop taking her other blood pressure medicine about 2 months ago as she was advised. For the last 10 days she has been trying to keep up with her blood pressure last 3 evenings it was  175/96 182/100 184/97  This morning 123/78 pulse 87 Little while ago it was 156/67

## 2023-07-24 ENCOUNTER — Ambulatory Visit: Payer: Medicare Other | Admitting: Internal Medicine

## 2023-07-26 ENCOUNTER — Ambulatory Visit: Payer: Medicare Other | Admitting: Neurology

## 2023-07-27 ENCOUNTER — Ambulatory Visit: Payer: Medicare Other | Admitting: Neurology

## 2023-07-31 ENCOUNTER — Inpatient Hospital Stay: Payer: Medicare Other | Attending: Oncology

## 2023-07-31 ENCOUNTER — Inpatient Hospital Stay (HOSPITAL_BASED_OUTPATIENT_CLINIC_OR_DEPARTMENT_OTHER): Payer: Medicare Other | Admitting: Oncology

## 2023-07-31 VITALS — BP 153/72 | HR 97 | Temp 98.1°F | Resp 18 | Ht 64.0 in | Wt 127.4 lb

## 2023-07-31 DIAGNOSIS — E039 Hypothyroidism, unspecified: Secondary | ICD-10-CM | POA: Insufficient documentation

## 2023-07-31 DIAGNOSIS — G8929 Other chronic pain: Secondary | ICD-10-CM | POA: Insufficient documentation

## 2023-07-31 DIAGNOSIS — M48061 Spinal stenosis, lumbar region without neurogenic claudication: Secondary | ICD-10-CM | POA: Insufficient documentation

## 2023-07-31 DIAGNOSIS — I10 Essential (primary) hypertension: Secondary | ICD-10-CM | POA: Diagnosis not present

## 2023-07-31 DIAGNOSIS — K5909 Other constipation: Secondary | ICD-10-CM | POA: Insufficient documentation

## 2023-07-31 DIAGNOSIS — Z79899 Other long term (current) drug therapy: Secondary | ICD-10-CM | POA: Insufficient documentation

## 2023-07-31 DIAGNOSIS — E785 Hyperlipidemia, unspecified: Secondary | ICD-10-CM | POA: Insufficient documentation

## 2023-07-31 DIAGNOSIS — K635 Polyp of colon: Secondary | ICD-10-CM | POA: Insufficient documentation

## 2023-07-31 DIAGNOSIS — C188 Malignant neoplasm of overlapping sites of colon: Secondary | ICD-10-CM | POA: Diagnosis not present

## 2023-07-31 DIAGNOSIS — R531 Weakness: Secondary | ICD-10-CM | POA: Diagnosis not present

## 2023-07-31 DIAGNOSIS — M549 Dorsalgia, unspecified: Secondary | ICD-10-CM | POA: Insufficient documentation

## 2023-07-31 DIAGNOSIS — N289 Disorder of kidney and ureter, unspecified: Secondary | ICD-10-CM | POA: Diagnosis not present

## 2023-07-31 DIAGNOSIS — K7460 Unspecified cirrhosis of liver: Secondary | ICD-10-CM | POA: Insufficient documentation

## 2023-07-31 DIAGNOSIS — C20 Malignant neoplasm of rectum: Secondary | ICD-10-CM | POA: Diagnosis not present

## 2023-07-31 LAB — CEA (ACCESS): CEA (CHCC): 3.41 ng/mL (ref 0.00–5.00)

## 2023-07-31 NOTE — Progress Notes (Signed)
[facility] OFFICE PROGRESS NOTE   Diagnosis: Rectal cancer  INTERVAL HISTORY:   Jeanne Smith returns as scheduled.  She feels well.  Good appetite.  Reports chronic constipation.  She developed sudden onset sudden onset of bilateral leg weakness in May.  No numbness or difficulty with bowel/bladder control.  She was unable to ambulate.  She reports seeing Dr. Yetta Barre and had an MRI of the lumbar spine on 04/06/2023.  Central canal stenosis was noted at L4-5.  She reports the leg strength gradually improved and is now at baseline.  She has been referred to neurology.  Objective:  Vital signs in last 24 hours:  Blood pressure (!) 153/72, pulse 97, temperature 98.1 F (36.7 C), temperature source Oral, resp. rate 18, height 5\' 4"  (1.626 m), weight 127 lb 6.4 oz (57.8 kg), SpO2 99%.     Lymphatics: No cervical, supraclavicular, axillary, or inguinal nodes Resp: Lungs clear bilaterally Cardio: Regular rate and rhythm GI: Nontender, no mass, no hepatosplenomegaly Vascular: No leg edema, bilateral lower leg varicosities Neuro: The leg and foot strength are intact bilaterally   Lab Results:  Lab Results  Component Value Date   WBC 6.7 04/16/2023   HGB 11.3 (L) 04/16/2023   HCT 33.1 (L) 04/16/2023   MCV 97.6 04/16/2023   PLT 137 (L) 04/16/2023   NEUTROABS 4,342 04/16/2023    CMP  Lab Results  Component Value Date   NA 141 04/16/2023   K 3.7 04/16/2023   CL 108 04/16/2023   CO2 23 04/16/2023   GLUCOSE 126 (H) 04/16/2023   BUN 29 (H) 04/16/2023   CREATININE 1.68 (H) 04/16/2023   CALCIUM 9.2 04/16/2023   PROT 5.7 (L) 04/16/2023   ALBUMIN 3.3 (L) 04/10/2023   AST 135 (H) 04/16/2023   ALT 114 (H) 04/16/2023   ALKPHOS 146 (H) 04/10/2023   BILITOT 0.6 04/16/2023   GFRNONAA 25 (L) 04/10/2023   GFRAA 87 01/17/2021    Lab Results  Component Value Date   CEA 4.23 01/22/2023   Medications: I have reviewed the patient's current  medications.   Assessment/Plan: Rectal cancer Mass at 12-14 cm from the anal verge on colonoscopy 05/03/2022-invasive well to moderately differentiated adenocarcinoma arising in an adenoma with high-grade dysplasia CTs 05/12/2022-small polypoid lesion and masslike area of thickening in the ascending colon, no evidence of metastatic disease, changes of cirrhosis and hepatic steatosis Colonoscopy 05/25/2022-nonobstructing mass at 12-14 cm, tattoo at the distal end, 6 mm polyp in the proximal transverse colon no right colon mass lesion Pelvic MRI 06/06/2022-no primary rectal tumor visualized, MRI stage T1/T2, N0 Robotic low anterior resection 07/05/2022-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 Tubular adenoma 05/03/2022, sessile serrated adenoma 05/25/2022, histologic description of gross polyps on gross specimen not described histologically on final pathology report 3.  Changes of cirrhosis on CT 05/12/2022 4.  Alcohol use 5.  Hyperlipidemia 6.  Hypertension 7.  Chronic back pain 8.  Hypothyroidism 9.  Renal insufficiency    Disposition: Jeanne Smith is in clinical remission from rectal cancer.  She we will schedule a surveillance colonoscopy with Dr. Chales Abrahams.  She will return for an office visit in 6 months.  I am available to see her sooner as needed.  The etiology of the transient leg weakness in May is unclear.  She will continue follow-up with Dr. Yetta Barre for evaluation and management of spinal stenosis. She has been referred to nephrology for evaluation of renal insufficiency.  She will follow-up with Drs. Chales Abrahams and Baxley for evaluation of elevated liver enzymes.   Thornton Papas, MD  07/31/2023  2:49 PM

## 2023-08-02 NOTE — Progress Notes (Signed)
Patient Care Team: Margaree Mackintosh, MD as PCP - General [place], Peter M, MD as PCP - Cardiology (Cardiology)  Visit Date: 08/09/23  Subjective:    Patient ID: Jeanne Smith , Female   DOB: 1951-03-25, 72 y.o.    MRN: [id number]   72 y.o. Female presents today for a 6 month follow-up. History of anxiety, arthritis, ADD, cancer, palpitations 2019, seasonal allergies, hyperlipidemia, hypertension, hypothyroidism, insomnia, osteoarthritis, osteopenia, palpitations, pneumonia.  She has been having weakness in bilateral legs. Has an upcoming appointment with neurologist, Dr. Lucia Gaskins. MRI lumbar spine on 04/06/2023 showed central canal stenosis at L4-5.   History of chronic constipation. She has had a severe flare-up for the past several days. Believes she has been dehydrated recently due to being occupied with taking care of family members.  History of edema treated with furosemide 20 mg daily. Reports this is helping symptoms without negative side effects.  History of hypertension treated with metoprolol succinate 25 mg daily. Blood pressure elevated in-office at 150/90. Her blood pressure has been fluctuating at home. History of paroxysmal atrial fibrillation.  History of Vitamin D deficiency treated with Drisdol 50,000 units weekly.  History of anxiety treated with alprazolam 0.5 mg as needed.  History of osteoarthritis with 03/27/18 DEXA showing osteopenia.  History of eczema on lower legs.   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 CEA is normal on 07/31/23. Plans to schedule a surveillance colonoscopy. Followed by her oncologist, Dr. Thornton Papas.   Mammogram last completed 03/28/19. There is extensive extracapsular silicone rupture bilaterally. No mammographic evidence of malignancy in the bilateral breasts.    Past Medical History:  Diagnosis Date   Anxiety    Arthritis    oa   Attention deficit disorder    Cancer [facility])    Dysrhythmia 2019   episode of  palpitations x 25 January 2018, stress test normal   H/O seasonal allergies    Hyperlipidemia    Hypertension    Hypothyroidism    thyroid nodules, off synthroid > 15 years   Insomnia    Osteoarthritis    Osteopenia    Palpitations    Pneumonia    Thyroid nodule      Family History  Problem Relation Age of Onset   Heart failure Mother    Hypertension Father    Cancer Father        lung   Stroke Father    Heart disease Brother    Alcoholism Brother    Colon cancer Neg Hx    Pancreatic cancer Neg Hx    Esophageal cancer Neg Hx    Liver cancer Neg Hx    Stomach cancer Neg Hx     Social Hx: See previous dictation. Unchanged.     Review of Systems  Constitutional:  Negative for fever and malaise/fatigue.  HENT:  Negative for congestion.   Eyes:  Negative for blurred vision.  Respiratory:  Negative for cough and shortness of breath.   Cardiovascular:  Negative for chest pain, palpitations and leg swelling.  Gastrointestinal:  Positive for constipation. Negative for vomiting.  Musculoskeletal:  Negative for back pain.  Skin:  Negative for rash.  Neurological:  Negative for loss of consciousness and headaches.        Objective:   Vitals: BP (!) 150/90   Pulse 98   Temp 98.4 F (36.9 C)   Ht 5\' 4"  (1.626 m)   Wt 125 lb (56.7 kg)   SpO2 97%  BMI 21.46 kg/m    Physical Exam Vitals and nursing note reviewed.  Constitutional:      General: She is not in acute distress.    Appearance: Normal appearance. She is not toxic-appearing.  HENT:     Head: Normocephalic and atraumatic.  Cardiovascular:     Rate and Rhythm: Normal rate and regular rhythm. No extrasystoles are present.    Pulses: Normal pulses.     Heart sounds: Normal heart sounds. No murmur heard.    No friction rub. No gallop.  Pulmonary:     Effort: Pulmonary effort is normal. No respiratory distress.     Breath sounds: Normal breath sounds. No wheezing or rales.  Skin:    General: Skin is warm  and dry.  Neurological:     Mental Status: She is alert and oriented to person, place, and time. Mental status is at baseline.  Psychiatric:        Mood and Affect: Mood normal.        Behavior: Behavior normal.        Thought Content: Thought content normal.        Judgment: Judgment normal.       Results:   Studies obtained and personally reviewed by me:  Mammogram last completed 03/28/19. There is extensive extracapsular silicone rupture bilaterally. No mammographic evidence of malignancy in the bilateral breasts.   MRI lumbar spine on 04/06/2023 showed central canal stenosis at L4-5.   Labs:       Component Value Date/Time   NA 141 04/16/2023 1150   K 3.7 04/16/2023 1150   CL 108 04/16/2023 1150   CO2 23 04/16/2023 1150   GLUCOSE 126 (H) 04/16/2023 1150   BUN 29 (H) 04/16/2023 1150   CREATININE 1.68 (H) 04/16/2023 1150   CALCIUM 9.2 04/16/2023 1150   PROT 5.7 (L) 04/16/2023 1150   ALBUMIN 3.3 (L) 04/10/2023 1456   AST 135 (H) 04/16/2023 1150   ALT 114 (H) 04/16/2023 1150   ALKPHOS 146 (H) 04/10/2023 1456   BILITOT 0.6 04/16/2023 1150   GFRNONAA 25 (L) 04/10/2023 1456   GFRNONAA 75 01/17/2021 0923   GFRAA 87 01/17/2021 0923     Lab Results  Component Value Date   WBC 6.7 04/16/2023   HGB 11.3 (L) 04/16/2023   HCT 33.1 (L) 04/16/2023   MCV 97.6 04/16/2023   PLT 137 (L) 04/16/2023    Lab Results  Component Value Date   CHOL 153 01/22/2023   HDL 76 01/22/2023   LDLCALC 56 01/22/2023   TRIG 133 01/22/2023   CHOLHDL 2.0 01/22/2023    Lab Results  Component Value Date   HGBA1C 5.7 (H) 04/02/2023     Lab Results  Component Value Date   TSH 1.56 04/02/2023      Assessment & Plan:   Chronic constipation: worse recently. was given recipe for MiraLax purge.  Edema: well-controlled with furosemide 20 mg daily.  Hypertension: start amlodipine 5 mg daily. Continue metoprolol succinate 25 mg daily. Blood pressure elevated today at 150/90. Her blood  pressure has been fluctuating at home.  Call with BP readings in 7-10 days after starting medication.  Vitamin D deficiency: treated with Drisdol 50,000 units weekly.  Anxiety: treated with alprazolam 0.5 mg as needed.  History of osteoarthritis   Chronic kidney disease stage 3b seen by [facility]  Likely has osteoporosis- no recent bone density. Pt has been asked to do this study. Hx of osteopenia.  History of early-stage rectal cancer  in clinical remission. CEA is normal. Plans to schedule surveillance colonoscopy with Dr. Chales Abrahams. Followed by her oncologist, Dr. Thornton Papas.   Mammogram last completed 03/28/19. There is extensive extracapsular silicone rupture bilaterally. No mammographic evidence of malignancy in the bilateral breasts.   Return in 6 months for health maintenance exam or as needed.Call with BP readings on amlodipine in the next 7 to 10 days after obtaining several readings.  Using generic Chantix nasal spray to help with urge to smoke.  I,Alexander Ruley,acting as a Neurosurgeon for Margaree Mackintosh, MD.,have documented all relevant documentation on the behalf of Margaree Mackintosh, MD,as directed by  Margaree Mackintosh, MD while in the presence of Margaree Mackintosh, MD.   I, Margaree Mackintosh, MD, have reviewed all documentation for this visit. The documentation on 08/13/23 for the exam, diagnosis, procedures, and orders are all accurate and complete.

## 2023-08-09 ENCOUNTER — Encounter: Payer: Self-pay | Admitting: Internal Medicine

## 2023-08-09 ENCOUNTER — Ambulatory Visit (INDEPENDENT_AMBULATORY_CARE_PROVIDER_SITE_OTHER): Payer: Medicare Other | Admitting: Internal Medicine

## 2023-08-09 VITALS — BP 150/90 | HR 98 | Temp 98.4°F | Ht 64.0 in | Wt 125.0 lb

## 2023-08-09 DIAGNOSIS — F411 Generalized anxiety disorder: Secondary | ICD-10-CM

## 2023-08-09 DIAGNOSIS — C189 Malignant neoplasm of colon, unspecified: Secondary | ICD-10-CM | POA: Diagnosis not present

## 2023-08-09 DIAGNOSIS — N1832 Chronic kidney disease, stage 3b: Secondary | ICD-10-CM

## 2023-08-09 DIAGNOSIS — R600 Localized edema: Secondary | ICD-10-CM

## 2023-08-09 DIAGNOSIS — E782 Mixed hyperlipidemia: Secondary | ICD-10-CM

## 2023-08-09 DIAGNOSIS — Z8679 Personal history of other diseases of the circulatory system: Secondary | ICD-10-CM | POA: Diagnosis not present

## 2023-08-09 DIAGNOSIS — I10 Essential (primary) hypertension: Secondary | ICD-10-CM | POA: Diagnosis not present

## 2023-08-09 DIAGNOSIS — Z96643 Presence of artificial hip joint, bilateral: Secondary | ICD-10-CM | POA: Diagnosis not present

## 2023-08-13 ENCOUNTER — Telehealth: Payer: Self-pay | Admitting: Internal Medicine

## 2023-08-13 MED ORDER — AMLODIPINE BESYLATE 5 MG PO TABS
5.0000 mg | ORAL_TABLET | Freq: Every day | ORAL | 0 refills | Status: DC
Start: 2023-08-13 — End: 2023-11-19

## 2023-08-13 NOTE — Patient Instructions (Signed)
Start amlodipine 5 mg daily and follow-up with daily blood pressure readings.  Contact us with blood pressure readings in 7 to 10 days after starting medication.  May take MiraLAX purge to relieve constipation x 1.  Patient is to schedule repeat colonoscopy at  GI.

## 2023-08-13 NOTE — Telephone Encounter (Signed)
Jonnette Stockett [phone]  Darel Hong called to say she has not received her new prescription you guys talked about when she was here last week. She said she thinks it was amlodipine 5 mg

## 2023-08-21 ENCOUNTER — Other Ambulatory Visit: Payer: Self-pay | Admitting: Internal Medicine

## 2023-08-23 ENCOUNTER — Telehealth: Payer: Self-pay | Admitting: Internal Medicine

## 2023-08-23 NOTE — Telephone Encounter (Signed)
LVM to CB to reschedule appointment on 09/10/2023 to different time or day

## 2023-08-23 NOTE — Telephone Encounter (Signed)
Patient called back

## 2023-09-03 NOTE — Progress Notes (Signed)
Patient Care Team: Margaree Mackintosh, MD as PCP - General [place], Peter M, MD as PCP - Cardiology (Cardiology)  Visit Date: 09/10/23  Subjective:    Patient ID: Jeanne Smith , Female   DOB: [birth year], 72 y.o.    MRN: [id number]   72 y.o. Female presents today for a blood pressure check and sick visit. She started amlodipine 5 mg daily for hypertension on 08/13/23. Also taking metoprolol succinate 25 mg daily. Blood pressure normal today at 108/60.   She has been having body aches, sore throat, cough with clear sputum since 09/06/23. Denies Covid contact. Denies negative side effects from levofloxacin in the past.  Requesting valacyclovir refill for HSV-1 flare-up.  Past Medical History:  Diagnosis Date   Anxiety    Arthritis    oa   Attention deficit disorder    Cancer [facility])    Dysrhythmia 2019   episode of palpitations x 25 January 2018, stress test normal   H/O seasonal allergies    Hyperlipidemia    Hypertension    Hypothyroidism    thyroid nodules, off synthroid > 15 years   Insomnia    Osteoarthritis    Osteopenia    Palpitations    Pneumonia    Thyroid nodule      Family History  Problem Relation Age of Onset   Heart failure Mother    Hypertension Father    Cancer Father        lung   Stroke Father    Heart disease Brother    Alcoholism Brother    Colon cancer Neg Hx    Pancreatic cancer Neg Hx    Esophageal cancer Neg Hx    Liver cancer Neg Hx    Stomach cancer Neg Hx     Social Hx: widow. Has steady female partner. Has adult children. Son with hx of alcohol issues     Review of Systems  Constitutional:  Positive for malaise/fatigue. Negative for fever.  HENT:  Positive for sore throat. Negative for congestion.   Eyes:  Negative for blurred vision.  Respiratory:  Positive for cough and sputum production (Clear). Negative for shortness of breath.   Cardiovascular:  Negative for chest pain, palpitations and leg swelling.  Gastrointestinal:   Negative for vomiting.  Musculoskeletal:  Negative for back pain.  Skin:  Negative for rash.  Neurological:  Negative for loss of consciousness and headaches.        Objective:   Vitals: BP 108/60   Pulse 87   Temp 98.1 F (36.7 C)   Ht 5\' 4"  (1.626 m)   Wt 129 lb (58.5 kg)   SpO2 98%   BMI 22.14 kg/m    Physical Exam Vitals and nursing note reviewed.  Constitutional:      General: She is not in acute distress.    Appearance: Normal appearance. She is not toxic-appearing.  HENT:     Head: Normocephalic and atraumatic.     Right Ear: Hearing, ear canal and external ear normal.     Left Ear: Hearing, ear canal and external ear normal.     Ears:     Comments: Right TM full, no erythema. Left TM dull with splayed light reflex.    Mouth/Throat:     Comments: Pharynx injected without exudate. Pulmonary:     Effort: Pulmonary effort is normal. No respiratory distress.     Breath sounds: Normal breath sounds. No wheezing or rales.  Skin:    General: Skin  is warm and dry.     Comments: Fever blister philtrum.  Neurological:     Mental Status: She is alert and oriented to person, place, and time. Mental status is at baseline.  Psychiatric:        Mood and Affect: Mood normal.        Behavior: Behavior normal.        Thought Content: Thought content normal.        Judgment: Judgment normal.       Results:   Studies obtained and personally reviewed by me:   Labs:       Component Value Date/Time   NA 141 04/16/2023 1150   K 3.7 04/16/2023 1150   CL 108 04/16/2023 1150   CO2 23 04/16/2023 1150   GLUCOSE 126 (H) 04/16/2023 1150   BUN 29 (H) 04/16/2023 1150   CREATININE 1.68 (H) 04/16/2023 1150   CALCIUM 9.2 04/16/2023 1150   PROT 5.7 (L) 04/16/2023 1150   ALBUMIN 3.3 (L) 04/10/2023 1456   AST 135 (H) 04/16/2023 1150   ALT 114 (H) 04/16/2023 1150   ALKPHOS 146 (H) 04/10/2023 1456   BILITOT 0.6 04/16/2023 1150   GFRNONAA 25 (L) 04/10/2023 1456   GFRNONAA 75  01/17/2021 0923   GFRAA 87 01/17/2021 0923     Lab Results  Component Value Date   WBC 6.7 04/16/2023   HGB 11.3 (L) 04/16/2023   HCT 33.1 (L) 04/16/2023   MCV 97.6 04/16/2023   PLT 137 (L) 04/16/2023    Lab Results  Component Value Date   CHOL 153 01/22/2023   HDL 76 01/22/2023   LDLCALC 56 01/22/2023   TRIG 133 01/22/2023   CHOLHDL 2.0 01/22/2023    Lab Results  Component Value Date   HGBA1C 5.7 (H) 04/02/2023     Lab Results  Component Value Date   TSH 1.56 04/02/2023      Assessment & Plan:   Bilateral serous otitis media; Acute upper respiratory infection: prescribed levofloxacin 500 mg daily with food for seven days, Hycodan syrup 1 tsp every 8 hours as needed for cough, Diflucan 150 mg once if needed for Candida vaginitis. Get plenty of rest and stay well-hydrated. Contact us if symptoms worsen or do not improve.  Fever blister: refilled valacyclovir. Prescribed mupirocin twice daily.  Hypertension: blood pressure is well-controlled with amlodipine 5 mg daily, metoprolol succinate 25 mg daily.  Vaccine counseling: suggested flu, Covid-19 boosters when she is recovered.    I,Alexander Ruley,acting as a Neurosurgeon for Margaree Mackintosh, MD.,have documented all relevant documentation on the behalf of Margaree Mackintosh, MD,as directed by  Margaree Mackintosh, MD while in the presence of Margaree Mackintosh, MD.   I, Margaree Mackintosh, MD, have reviewed all documentation for this visit. The documentation on 09/23/23 for the exam, diagnosis, procedures, and orders are all accurate and complete.

## 2023-09-10 ENCOUNTER — Ambulatory Visit (INDEPENDENT_AMBULATORY_CARE_PROVIDER_SITE_OTHER): Payer: Medicare Other | Admitting: Internal Medicine

## 2023-09-10 ENCOUNTER — Encounter: Payer: Self-pay | Admitting: Internal Medicine

## 2023-09-10 ENCOUNTER — Ambulatory Visit: Payer: Medicare Other | Admitting: Internal Medicine

## 2023-09-10 VITALS — BP 108/60 | HR 87 | Temp 98.1°F | Ht 64.0 in | Wt 129.0 lb

## 2023-09-10 DIAGNOSIS — J069 Acute upper respiratory infection, unspecified: Secondary | ICD-10-CM

## 2023-09-10 DIAGNOSIS — C189 Malignant neoplasm of colon, unspecified: Secondary | ICD-10-CM

## 2023-09-10 DIAGNOSIS — H6503 Acute serous otitis media, bilateral: Secondary | ICD-10-CM

## 2023-09-10 DIAGNOSIS — B009 Herpesviral infection, unspecified: Secondary | ICD-10-CM | POA: Diagnosis not present

## 2023-09-10 DIAGNOSIS — I10 Essential (primary) hypertension: Secondary | ICD-10-CM | POA: Diagnosis not present

## 2023-09-10 DIAGNOSIS — E782 Mixed hyperlipidemia: Secondary | ICD-10-CM | POA: Diagnosis not present

## 2023-09-10 DIAGNOSIS — F411 Generalized anxiety disorder: Secondary | ICD-10-CM

## 2023-09-10 DIAGNOSIS — N1832 Chronic kidney disease, stage 3b: Secondary | ICD-10-CM

## 2023-09-10 MED ORDER — HYDROCODONE BIT-HOMATROP MBR 5-1.5 MG/5ML PO SOLN: 5.0000 mL | Freq: Three times a day (TID) | ORAL | 0 refills | Status: DC | PRN

## 2023-09-10 MED ORDER — FLUCONAZOLE 150 MG PO TABS: 150.0000 mg | ORAL_TABLET | Freq: Once | ORAL | 0 refills | Status: AC

## 2023-09-10 MED ORDER — LEVOFLOXACIN 500 MG PO TABS: 500.0000 mg | ORAL_TABLET | Freq: Every day | ORAL | 0 refills | Status: DC

## 2023-09-10 MED ORDER — VALACYCLOVIR HCL 500 MG PO TABS: 500.0000 mg | ORAL_TABLET | Freq: Two times a day (BID) | ORAL | 0 refills | Status: AC

## 2023-09-10 MED ORDER — MUPIROCIN 2 % EX OINT: 1.0000 | TOPICAL_OINTMENT | Freq: Two times a day (BID) | CUTANEOUS | 0 refills | Status: AC

## 2023-09-10 MED ORDER — HYDROCODONE BIT-HOMATROP MBR 5-1.5 MG/5ML PO SOLN: ORAL | 0 refills | Status: DC

## 2023-09-23 NOTE — Patient Instructions (Signed)
You have acute bilateral serous otitis media and an acute upper respiratory infection.  I have prescribed Levaquin 500 mg daily with food for 7 days.  May take Hycodan syrup 1 teaspoon every 8 hours as needed for cough and Diflucan 150 mg tablet one-time if needed for Candida vaginitis while on antibiotics.  Get plenty of rest and stay well-hydrated.  Contact us if symptoms worsen or do not improve.  For fever blister have refilled valacyclovir.  Also may use mupirocin to the lesion twice daily.  Blood pressure is well-controlled currently on amlodipine and metoprolol.  When you have recovered from this illness suggest influenza vaccine and COVID-19 booster.

## 2023-10-06 ENCOUNTER — Other Ambulatory Visit: Payer: Self-pay | Admitting: Internal Medicine

## 2023-11-08 ENCOUNTER — Encounter: Payer: Self-pay | Admitting: Gastroenterology

## 2023-11-08 ENCOUNTER — Telehealth: Payer: Self-pay

## 2023-11-08 ENCOUNTER — Other Ambulatory Visit: Payer: Self-pay

## 2023-11-08 ENCOUNTER — Other Ambulatory Visit (INDEPENDENT_AMBULATORY_CARE_PROVIDER_SITE_OTHER): Payer: Medicare Other

## 2023-11-08 ENCOUNTER — Ambulatory Visit: Payer: Medicare Other | Admitting: Gastroenterology

## 2023-11-08 VITALS — BP 100/56 | HR 88 | Ht 64.0 in | Wt 130.1 lb

## 2023-11-08 DIAGNOSIS — K7460 Unspecified cirrhosis of liver: Secondary | ICD-10-CM

## 2023-11-08 DIAGNOSIS — K625 Hemorrhage of anus and rectum: Secondary | ICD-10-CM | POA: Diagnosis not present

## 2023-11-08 DIAGNOSIS — C20 Malignant neoplasm of rectum: Secondary | ICD-10-CM

## 2023-11-08 DIAGNOSIS — E876 Hypokalemia: Secondary | ICD-10-CM

## 2023-11-08 DIAGNOSIS — K5909 Other constipation: Secondary | ICD-10-CM

## 2023-11-08 DIAGNOSIS — Z860100 Personal history of colon polyps, unspecified: Secondary | ICD-10-CM

## 2023-11-08 LAB — COMPREHENSIVE METABOLIC PANEL
ALT: 38 U/L — ABNORMAL HIGH (ref 0–35)
AST: 116 U/L — ABNORMAL HIGH (ref 0–37)
Albumin: 3.4 g/dL — ABNORMAL LOW (ref 3.5–5.2)
Alkaline Phosphatase: 262 U/L — ABNORMAL HIGH (ref 39–117)
BUN: 12 mg/dL (ref 6–23)
CO2: 24 meq/L (ref 19–32)
Calcium: 8.7 mg/dL (ref 8.4–10.5)
Chloride: 102 meq/L (ref 96–112)
Creatinine, Ser: 1.42 mg/dL — ABNORMAL HIGH (ref 0.40–1.20)
GFR: 36.87 mL/min — ABNORMAL LOW (ref >60.00)
Glucose, Bld: 140 mg/dL — ABNORMAL HIGH (ref 70–99)
Potassium: 2.8 meq/L — CL (ref 3.5–5.1)
Sodium: 139 meq/L (ref 135–145)
Total Bilirubin: 1.6 mg/dL — ABNORMAL HIGH (ref 0.2–1.2)
Total Protein: 6.8 g/dL (ref 6.0–8.3)

## 2023-11-08 LAB — CBC WITH DIFFERENTIAL/PLATELET
Basophils Absolute: 0.1 10*3/uL (ref 0.0–0.1)
Basophils Relative: 1.5 % (ref 0.0–3.0)
Eosinophils Absolute: 0.1 10*3/uL (ref 0.0–0.7)
Eosinophils Relative: 1.7 % (ref 0.0–5.0)
HCT: 30.3 % — ABNORMAL LOW (ref 36.0–46.0)
Hemoglobin: 10.4 g/dL — ABNORMAL LOW (ref 12.0–15.0)
Lymphocytes Relative: 18.9 % (ref 12.0–46.0)
Lymphs Abs: 1.3 10*3/uL (ref 0.7–4.0)
MCHC: 34.3 g/dL (ref 30.0–36.0)
MCV: 104.3 fL — ABNORMAL HIGH (ref 78.0–100.0)
Monocytes Absolute: 0.6 10*3/uL (ref 0.1–1.0)
Monocytes Relative: 8.1 % (ref 3.0–12.0)
Neutro Abs: 4.8 10*3/uL (ref 1.4–7.7)
Neutrophils Relative %: 69.8 % (ref 43.0–77.0)
Platelets: 128 10*3/uL — ABNORMAL LOW (ref 150.0–400.0)
RBC: 2.91 Mil/uL — ABNORMAL LOW (ref 3.87–5.11)
RDW: 14.5 % (ref 11.5–15.5)
WBC: 6.8 10*3/uL (ref 4.0–10.5)

## 2023-11-08 LAB — PROTIME-INR
INR: 1.3 {ratio} — ABNORMAL HIGH (ref 0.8–1.0)
Prothrombin Time: 13.3 s — ABNORMAL HIGH (ref 9.6–13.1)

## 2023-11-08 MED ORDER — POTASSIUM CHLORIDE CRYS ER 20 MEQ PO TBCR: 40.0000 meq | EXTENDED_RELEASE_TABLET | Freq: Every day | ORAL | 0 refills | Status: DC

## 2023-11-08 NOTE — Progress Notes (Signed)
Please inform the patient. Critically low potassium, she is taking Lasix as well. Cr 1.4  Hold Lasix for now (as no swelling, appears to be dry)  Call in K Dur 40mg  po every day x 5 days Rpt BMP in 7 days. CT AP with PO contrast (unfortunately cannot give IV contrast due to low GFR)- R/O metastatic disease and to evaluate for liver cirrhosis.   Send report to family physician

## 2023-11-08 NOTE — Telephone Encounter (Signed)
Got a critical from the lab - patient's potassium is 2.8

## 2023-11-08 NOTE — Progress Notes (Signed)
Chief Complaint: Rectal bleeding  Referring Provider:  Margaree Mackintosh, MD      ASSESSMENT AND PLAN;   #1. Rectal bleeding - D/d hoids, AVMs, colitis, polyps, stercoral ulcers etc, r/o recurrence.  She did not get chemoradiation.  #2. 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 Nl CEA.  #3. H/O polyps  #4. 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   #5. Chronic constipation  Plan: -CBC, CMP, INR, AFP, FIB4 -Stop all ETOH -Colon with miralax prep ASAP -Needs CT AP and EGD thereafter. -D/W pt and husband in detail.   HPI:    Jeanne Smith is a 72 y.o. female  With multiple medical problems as listed below including HTN, HLD, CKD (followed by Dr. Hope Budds ), chronic back pain, spinal stenosis, hypothyroidism, proximal rectal adenocarcinoma s/p LAR, incidental liver cirrhosis on CT with continued alcohol use  C/O Rectal bleeding x 1 week, mostly bright red in color, occasionally mixed with the stool.  Not associated with abdominal or rectal pain.  According to the patient, it has slowed down.  She has been constipated over the last few days.  Feels like she is "dehydrated".  She has also been taking Lasix 20 mg p.o. daily for leg swelling which she attributes to amlodipine.  No recent weight loss.  She denies having any nausea, vomiting, heartburn, regurgitation, odynophagia or dysphagia.  No nonsteroidals.  Most recent CEA level was normal.  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  Found to have liver cirrhosis on CT done for metastatic workup.       Wt Readings from Last 3 Encounters:  11/08/23 130 lb 2 oz (59 kg)  09/10/23 129 lb (58.5 kg)  08/09/23 125 lb (56.7 kg)   [place] 03/2023 IMPRESSION: 1. Mildly nodular liver contour, suggestive of cirrhosis. 2. Common bile duct caliber at the upper limits of normal and unchanged from 05/27/2020 ultrasound. 3. Mildly dilated pancreatic duct. If clinically indicated,  this can be further evaluated with MRCP.  CT CAP with contrast 05/13/2022 1. Small polypoid lesion and mass-like area of mural thickening in the ascending colon, as above, which may correspond to the reported colonic neoplasm. No definite signs of metastatic disease confidently identified elsewhere in the chest, abdomen or pelvis. 2. Morphologic changes in the liver indicative of underlying cirrhosis. There is also a background of hepatic steatosis. 3. Aortic atherosclerosis, in addition to three-vessel coronary artery disease. Assessment for potential risk factor modification, dietary therapy or pharmacologic therapy may be warranted, if clinically indicated. 4. Additional incidental findings, as above.   Oncology history: Rectal cancer (No chemo/XRT) Mass at 12-14 cm from the anal verge on colonoscopy 05/03/2022-invasive well to moderately differentiated adenocarcinoma arising in an adenoma with high-grade dysplasia CTs 05/12/2022-small polypoid lesion and masslike area of thickening in the ascending colon, no evidence of metastatic disease, changes of cirrhosis and hepatic steatosis Colonoscopy 05/25/2022-nonobstructing mass at 12-14 cm, tattoo at the distal end, 6 mm polyp in the proximal transverse colon no right colon mass lesion Pelvic MRI 06/06/2022-no primary rectal tumor visualized, MRI stage T1/T2, N0 Robotic low anterior resection 07/05/2022-moderately differentiated adenocarcinoma of the rectosigmoid junction,pT2pN0, 0/15 nodes, no lymphovascular perineural invasion, negative resection margins, no loss of mismatch repair protein expression   Multiple colon polyps noted on colonoscopy 05/03/2022 and 05/25/2022, as well as on the gross surgical specimen 07/05/2022. Tubular adenoma 05/03/2022, sessile serrated adenoma 05/25/2022, histologic description of gross polyps on gross specimen not  described histologically on final pathology report  GI procedures: Colonoscopy 05/25/2022 - Malignant  tumor in the proximal rectum ( as previously diagnosed and biopsied) . - One 6 mm polyp in the proximal transverse colon, removed with a cold snare. Resected and retrieved. - Non- bleeding internal hemorrhoids. - The examination was otherwise normal. No right colon masses/ lesions.  Colonoscopy 05/03/2022 - Likely malignant tumor in the rectum. Biopsied. Tattooed. - Two 2 to 3 mm polyps in the descending colon, removed with a cold snare. Resected and retrieved. - One 4- 5 mm polyp in the proximal transverse colon, removed with a cold snare. Resected and retrieved. - Right side of the colon was not examined due to the SVT that occurred during the procedure. Past Medical History:  Diagnosis Date   Anxiety    Arthritis    oa   Attention deficit disorder    Cancer [facility])    Dysrhythmia 2019   episode of palpitations x 25 January 2018, stress test normal   H/O seasonal allergies    Hyperlipidemia    Hypertension    Hypothyroidism    thyroid nodules, off synthroid > 15 years   Insomnia    Osteoarthritis    Osteopenia    Palpitations    Pneumonia    Thyroid nodule     Past Surgical History:  Procedure Laterality Date   ABDOMINAL HYSTERECTOMY     AUGMENTATION MAMMAPLASTY Bilateral    silicone over 30 years   COLONOSCOPY     COLONOSCOPY WITH PROPOFOL N/A 05/25/2022   Procedure: COLONOSCOPY WITH PROPOFOL;  Surgeon: Lynann Bologna, MD;  Location: [facility];  Service: Gastroenterology;  Laterality: N/A;   CONVERSION TO TOTAL HIP Left 05/16/2018   Procedure: LEFT HIP ANTERIOR APPROACH;  Surgeon: Durene Romans, MD;  Location: [facility];  Service: Orthopedics;  Laterality: Left;   FEMUR IM NAIL Right 06/16/2015   Procedure: INTRAMEDULLARY (IM) NAIL FEMORAL;  Surgeon: Sheral Apley, MD;  Location: [facility];  Service: Orthopedics;  Laterality: Right;   HARDWARE REMOVAL Right 09/24/2020   Procedure: REMOVAL OF IM ROD/HIP SCREW RIGHT HIP;  Surgeon: Kathryne Hitch, MD;  Location: [facility];   Service: Orthopedics;  Laterality: Right;   HARDWARE REMOVAL Right 12/24/2020   Procedure: HARDWARE REMOVAL RIGHT WRIST;  Surgeon: Tarry Kos, MD;  Location: [facility];  Service: Orthopedics;  Laterality: Right;   HIP SURGERY     JOINT REPLACEMENT     ORIF RADIAL FRACTURE Right 12/24/2020   Procedure: OPEN REDUCTION INTERNAL FIXATION (ORIF) RIGHT RADIAL SHAFT FRACTURE;  Surgeon: Tarry Kos, MD;  Location: [facility];  Service: Orthopedics;  Laterality: Right;   POLYPECTOMY  05/25/2022   Procedure: POLYPECTOMY;  Surgeon: Lynann Bologna, MD;  Location: [facility];  Service: Gastroenterology;;   TONSILLECTOMY     TOTAL HIP ARTHROPLASTY Right 09/24/2020   Procedure: RIGHT TOTAL HIP ARTHROPLASTY ANTERIOR APPROACH;  Surgeon: Kathryne Hitch, MD;  Location: [facility];  Service: Orthopedics;  Laterality: Right;   TUBAL LIGATION     XI ROBOTIC ASSISTED LOWER ANTERIOR RESECTION N/A 07/05/2022   Procedure: XI ROBOTIC ASSISTED LOWER ANTERIOR RESECTION;  Surgeon: Romie Levee, MD;  Location: [facility];  Service: General;  Laterality: N/A;    Family History  Problem Relation Age of Onset   Heart failure Mother    Hypertension Father    Cancer Father        lung   Stroke Father    Heart disease Brother  Alcoholism Brother    Colon cancer Neg Hx    Pancreatic cancer Neg Hx    Esophageal cancer Neg Hx    Liver cancer Neg Hx    Stomach cancer Neg Hx     Social History   Tobacco Use   Smoking status: Former    Current packs/day: 0.00    Types: Cigarettes    Quit date: 11/27/2005    Years since quitting: 17.9   Smokeless tobacco: Never  Vaping Use   Vaping status: Never Used  Substance Use Topics   Alcohol use: Yes    Alcohol/week: 14.0 standard drinks of alcohol    Types: 14 Glasses of wine per week    Comment: scotch or wine daily   Drug use: No    Current Outpatient Medications  Medication Sig Dispense Refill   ALPRAZolam (XANAX) 0.5 MG  tablet Take 0.5 mg by mouth as needed for anxiety.     amLODipine (NORVASC) 5 MG tablet Take 1 tablet (5 mg total) by mouth daily. 90 tablet 0   Doxylamine Succinate, Sleep, (SLEEP AID PO) Take 1 tablet by mouth at bedtime.     estradiol (ESTRACE) 2 MG tablet Take 2 mg by mouth at bedtime.      furosemide (LASIX) 20 MG tablet TAKE 1 TABLET(20 MG) BY MOUTH DAILY 90 tablet 1   Hypromellose, PF, (RETAINE HPMC) 0.3 % SOLN Place 1 drop into both eyes 3 (three) times daily as needed (for dryness).     Magnesium 250 MG TABS Take 250 mg by mouth at bedtime.     metoprolol succinate (TOPROL-XL) 25 MG 24 hr tablet TAKE 1 TABLET(25 MG) BY MOUTH DAILY 90 tablet 3   metroNIDAZOLE (METROGEL) 1 % gel Apply 1 Application topically as needed.     mupirocin ointment (BACTROBAN) 2 % Apply 1 Application topically 2 (two) times daily. 22 g 0   PREVIDENT 5000 BOOSTER PLUS 1.1 % PSTE USE TO BRUSH TEETH AFTER MEALS AND BEFORE BEDTIME     Saline 0.9 % AERS Place 1 spray into both nostrils as needed (congestion).     triamcinolone 0.1% oint-Eucerin equivalent cream 1:1 mixture Apply topically daily as needed. 60 g 1   Vitamin D, Ergocalciferol, (DRISDOL) 1.25 MG (50000 UNIT) CAPS capsule TAKE 1 CAPSULE BY MOUTH EVERY 7 DAYS 12 capsule 3   aspirin 81 MG chewable tablet Chew 1 tablet (81 mg total) by mouth 2 (two) times daily. (Patient not taking: Reported on 11/08/2023) 30 tablet 0   docusate sodium (COLACE) 100 MG capsule Take 100 mg by mouth daily. (Patient not taking: Reported on 11/08/2023)     Varenicline Tartrate (TYRVAYA) 0.03 MG/ACT SOLN Place into the nose in the morning and at bedtime. (Patient not taking: Reported on 11/08/2023)     No current facility-administered medications for this visit.    No Known Allergies  Review of Systems:  Has fatigue, anxiety    Physical Exam:    BP (!) 100/56 (BP Location: Left Arm, Patient Position: Sitting, Cuff Size: Normal)   Pulse 88   Ht 5\' 4"  (1.626 m)   Wt 130  lb 2 oz (59 kg)   BMI 22.34 kg/m  Wt Readings from Last 3 Encounters:  11/08/23 130 lb 2 oz (59 kg)  09/10/23 129 lb (58.5 kg)  08/09/23 125 lb (56.7 kg)   Constitutional:  Well-developed, in no acute distress. Psychiatric: Normal mood and affect. Behavior is normal. HEENT: Pupils normal.  Conjunctivae-mild pallor.  No scleral  icterus. Cardiovascular: Normal rate, regular rhythm. No edema Pulmonary/chest: Effort normal and breath sounds normal. No wheezing, rales or rhonchi. Abdominal: Soft, nondistended. Nontender. Bowel sounds active throughout. There are no masses palpable. No hepatomegaly. Rectal: In presence of [place], hard stools, some bright red blood on the examining finger, heme positive. Neurological: Alert and oriented to person place and time. Skin: Skin is warm and dry. No rashes noted.  Data Reviewed: I have personally reviewed following labs and imaging studies  CBC:    Latest Ref Rng & Units 04/16/2023   11:50 AM 04/10/2023    2:56 PM 04/02/2023    4:29 PM  CBC  WBC 3.8 - 10.8 Thousand/uL 6.7  10.5  11.5   Hemoglobin 11.7 - 15.5 g/dL 16.1  09.6  04.5   Hematocrit 35.0 - 45.0 % 33.1  36.4  37.2   Platelets 140 - 400 Thousand/uL 137  157  152     CMP:    Latest Ref Rng & Units 04/16/2023   11:50 AM 04/10/2023    2:56 PM 04/09/2023   12:37 PM  CMP  Glucose 65 - 99 mg/dL 409  811  914   BUN 7 - 25 mg/dL 29  42  43   Creatinine 0.60 - 1.00 mg/dL 7.82  9.56  2.13   Sodium 135 - 146 mmol/L 141  137  136   Potassium 3.5 - 5.3 mmol/L 3.7  3.3  3.0   Chloride 98 - 110 mmol/L 108  103  102   CO2 20 - 32 mmol/L 23  23  21    Calcium 8.6 - 10.4 mg/dL 9.2  9.8  9.2   Total Protein 6.1 - 8.1 g/dL 5.7  6.6    Total Bilirubin 0.2 - 1.2 mg/dL 0.6  0.7    Alkaline Phos 38 - 126 U/L  146    AST 10 - 35 U/L 135  491    ALT 6 - 29 U/L 114  228          Edman Circle, MD 11/08/2023, 11:16 AM  Cc: Margaree Mackintosh, MD

## 2023-11-08 NOTE — Progress Notes (Signed)
CT and medication sent in and will mychart patient

## 2023-11-08 NOTE — Patient Instructions (Addendum)
_______________________________________________________  If your blood pressure at your visit was 140/90 or greater, please contact your primary care physician to follow up on this.  _______________________________________________________  If you are age 72 or older, your body mass index should be between 23-30. Your Body mass index is 22.34 kg/m. If this is out of the aforementioned range listed, please consider follow up with your Primary Care Provider.  If you are age 22 or younger, your body mass index should be between 19-25. Your Body mass index is 22.34 kg/m. If this is out of the aformentioned range listed, please consider follow up with your Primary Care Provider.   ________________________________________________________  The [facility] providers would like to encourage you to use [facility] to communicate with providers for non-urgent requests or questions.  Due to long hold times on the telephone, sending your provider a message by [facility] may be a faster and more efficient way to get a response.  Please allow 48 business hours for a response.  Please remember that this is for non-urgent requests.  _______________________________________________________  Your provider has requested that you go to the basement level for lab work before leaving today. Press "B" on the elevator. The lab is located at the first door on the left as you exit the elevator.  Stop all alcohol  Two days before your procedure: Mix 3 packs (or capfuls) of Miralax in 48 ounces of clear liquid and drink at 6pm.  You have been scheduled for a colonoscopy. Please follow written instructions given to you at your visit today.   Please pick up your prep supplies at the pharmacy within the next 1-3 days.  If you use inhalers (even only as needed), please bring them with you on the day of your procedure.  DO NOT TAKE 7 DAYS PRIOR TO TEST- Trulicity (dulaglutide) Ozempic, Wegovy (semaglutide) Mounjaro  (tirzepatide) Bydureon Bcise (exanatide extended release)  DO NOT TAKE 1 DAY PRIOR TO YOUR TEST Rybelsus (semaglutide) Adlyxin (lixisenatide) Victoza (liraglutide) Byetta (exanatide) ___________________________________________________________________________  Thank you,  Dr. Lynann Bologna

## 2023-11-09 LAB — AFP TUMOR MARKER: AFP-Tumor Marker: 7.6 ng/mL — ABNORMAL HIGH

## 2023-11-12 ENCOUNTER — Other Ambulatory Visit: Payer: Self-pay

## 2023-11-12 DIAGNOSIS — K7460 Unspecified cirrhosis of liver: Secondary | ICD-10-CM

## 2023-11-12 DIAGNOSIS — E876 Hypokalemia: Secondary | ICD-10-CM

## 2023-11-12 DIAGNOSIS — C20 Malignant neoplasm of rectum: Secondary | ICD-10-CM

## 2023-11-13 LAB — NASH FIBROSURE(R) PLUS

## 2023-11-14 LAB — NASH FIBROSURE(R) PLUS
ALPHA 2-MACROGLOBULINS, QN: 190 mg/dL (ref 110–276)
ALT (SGPT) P5P: 49 IU/L — ABNORMAL HIGH (ref 0–40)
AST (SGOT) P5P: 161 IU/L — ABNORMAL HIGH (ref 0–40)
Apolipoprotein A-1: 129 mg/dL (ref 116–209)
Bilirubin, Total: 1.4 mg/dL — ABNORMAL HIGH (ref 0.0–1.2)
Cholesterol, Total: 144 mg/dL (ref 100–199)
Fibrosis Score: 0.82 — ABNORMAL HIGH (ref 0.00–0.21)
GGT: 545 IU/L — ABNORMAL HIGH (ref 0–60)
Glucose: 146 mg/dL — ABNORMAL HIGH (ref 70–99)
Haptoglobin: 80 mg/dL (ref 42–346)
NASH Score: 0.97 — ABNORMAL HIGH (ref 0.00–0.25)
Steatosis Score: 0.91 — ABNORMAL HIGH (ref 0.00–0.40)
Triglycerides: 121 mg/dL (ref 0–149)

## 2023-11-14 LAB — FIB-4 W/RX NASH FIBROSURE PLUS
ALT: 40 IU/L — ABNORMAL HIGH (ref 0–32)
AST: 133 IU/L — ABNORMAL HIGH (ref 0–40)
FIB-4 Index: 12.31 — ABNORMAL HIGH (ref 0.00–2.67)
Platelets: 123 10*3/uL — ABNORMAL LOW (ref 150–450)

## 2023-11-15 ENCOUNTER — Encounter: Payer: Medicare Other | Admitting: Gastroenterology

## 2023-11-15 ENCOUNTER — Ambulatory Visit: Payer: Medicare Other | Admitting: Internal Medicine

## 2023-11-15 ENCOUNTER — Ambulatory Visit
Admission: RE | Admit: 2023-11-15 | Discharge: 2023-11-15 | Disposition: A | Discharge status: Home / Self Care | Payer: Medicare Other | Source: Ambulatory Visit | Attending: Internal Medicine | Admitting: Internal Medicine

## 2023-11-15 ENCOUNTER — Telehealth: Payer: Self-pay | Admitting: Gastroenterology

## 2023-11-15 VITALS — BP 100/60 | HR 60 | Temp 97.7°F | Ht 64.0 in | Wt 130.0 lb

## 2023-11-15 DIAGNOSIS — N1832 Chronic kidney disease, stage 3b: Secondary | ICD-10-CM | POA: Diagnosis not present

## 2023-11-15 DIAGNOSIS — I10 Essential (primary) hypertension: Secondary | ICD-10-CM

## 2023-11-15 DIAGNOSIS — F411 Generalized anxiety disorder: Secondary | ICD-10-CM

## 2023-11-15 DIAGNOSIS — J449 Chronic obstructive pulmonary disease, unspecified: Secondary | ICD-10-CM | POA: Diagnosis not present

## 2023-11-15 DIAGNOSIS — J22 Unspecified acute lower respiratory infection: Secondary | ICD-10-CM

## 2023-11-15 DIAGNOSIS — H6693 Otitis media, unspecified, bilateral: Secondary | ICD-10-CM | POA: Diagnosis not present

## 2023-11-15 MED ORDER — FLUCONAZOLE 150 MG PO TABS: 150.0000 mg | ORAL_TABLET | Freq: Once | ORAL | 0 refills | Status: AC

## 2023-11-15 MED ORDER — LEVOFLOXACIN 500 MG PO TABS: 500.0000 mg | ORAL_TABLET | Freq: Every day | ORAL | 0 refills | Status: DC

## 2023-11-15 MED ORDER — HYDROCODONE BIT-HOMATROP MBR 5-1.5 MG/5ML PO SOLN: 5.0000 mL | Freq: Three times a day (TID) | ORAL | 0 refills | Status: DC | PRN

## 2023-11-15 MED ORDER — LEVOFLOXACIN 250 MG PO TABS: 250.0000 mg | ORAL_TABLET | Freq: Every day | ORAL | 0 refills | Status: DC

## 2023-11-15 MED ORDER — CEFTRIAXONE SODIUM 1 G IJ SOLR
1.0000 g | Freq: Once | INTRAMUSCULAR | Status: AC
  Administered 2023-11-15: 1 g via INTRAMUSCULAR

## 2023-11-15 NOTE — Progress Notes (Signed)
Patient Care Team: Margaree Mackintosh, MD as PCP - General [place], Peter M, MD as PCP - Cardiology (Cardiology)  Visit Date: 11/15/23  Subjective:    Patient ID: Jeanne Smith , Female   DOB: [birth year], 72 y.o.    MRN: [id number]   72 y.o. Female presents today for sick visit.  Reports that her symptoms first began Saturday with a sore throat that progressed to a productive cough with expectoration of excessive clear mucus, nasal/chest congestion, rattling/wheezing. She also endorses experiencing chills, shakiness, and a fever of 101 yesterday and the day before. She was taking OTC cough medication for her symptoms. She is not UTD on her flu vaccination. Flu test done in-office was NEG.   History of Hypertension treated with 5 mg Amlodipine, 20 mg Lasix, and 25 mg Metoprolol succinate daily. She mentions that she would like to go back to a Amlodipine-Hydrochlorothiazide combination prescription. Her BP today was 100/60.  Also mentions that she was supposed to have a colonoscopy this morning as 3 weeks ago she experienced rectal hemorrhaging during a BM. She did follow-up with Enid Cutter, MD with GI who did a physical exam that indicated she has some hemorrhoids.   Past Medical History:  Diagnosis Date   Anxiety    Arthritis    oa   Attention deficit disorder    Cancer [facility])    Dysrhythmia 2019   episode of palpitations x 25 January 2018, stress test normal   H/O seasonal allergies    Hyperlipidemia    Hypertension    Hypothyroidism    thyroid nodules, off synthroid > 15 years   Insomnia    Osteoarthritis    Osteopenia    Palpitations    Pneumonia    Thyroid nodule      Family History  Problem Relation Age of Onset   Heart failure Mother    Hypertension Father    Cancer Father        lung   Stroke Father    Heart disease Brother    Alcoholism Brother    Colon cancer Neg Hx    Pancreatic cancer Neg Hx    Esophageal cancer Neg Hx    Liver cancer Neg Hx    Stomach  cancer Neg Hx     Social history: Reviewed and unchanged call     Review of Systems  Constitutional:  Positive for chills and fever (101, yesterday and day before). Negative for malaise/fatigue.  HENT:  Positive for congestion (nasal/chest) and sore throat.   Eyes:  Negative for blurred vision.  Respiratory:  Positive for cough (productive), sputum production (clear) and wheezing. Negative for shortness of breath.   Cardiovascular:  Negative for chest pain, palpitations and leg swelling.  Gastrointestinal:  Negative for vomiting.  Musculoskeletal:  Negative for back pain.  Skin:  Negative for rash.  Neurological:  Negative for loss of consciousness and headaches.        Objective:   Vitals: BP 100/60   Pulse 60   Temp 97.7 F (36.5 C)   Ht 5\' 4"  (1.626 m)   Wt 130 lb (59 kg)   SpO2 98%   BMI 22.31 kg/m    Physical Exam Vitals and nursing note reviewed.  Constitutional:      General: She is not in acute distress.    Appearance: Normal appearance. She is not toxic-appearing.  HENT:     Head: Normocephalic and atraumatic.     Ears:  Comments: Fluid behind left ear Bilateral fullness  Right ear splayed light reflex  Bilateral appearance pink Cardiovascular:     Rate and Rhythm: Normal rate and regular rhythm. No extrasystoles are present.    Pulses: Normal pulses.     Heart sounds: Normal heart sounds. No murmur heard.    No friction rub. No gallop.  Pulmonary:     Effort: Pulmonary effort is normal. No respiratory distress.     Breath sounds: Normal breath sounds. No wheezing or rales.  Skin:    General: Skin is warm and dry.  Neurological:     Mental Status: She is alert and oriented to person, place, and time. Mental status is at baseline.  Psychiatric:        Mood and Affect: Mood normal.        Behavior: Behavior normal.        Thought Content: Thought content normal.        Judgment: Judgment normal.       Results:   Studies obtained and  personally reviewed by me:  Labs:       Component Value Date/Time   NA 139 11/08/2023 1204   K 2.8 (LL) 11/08/2023 1204   CL 102 11/08/2023 1204   CO2 24 11/08/2023 1204   GLUCOSE 140 (H) 11/08/2023 1204   BUN 12 11/08/2023 1204   CREATININE 1.42 (H) 11/08/2023 1204   CREATININE 1.68 (H) 04/16/2023 1150   CALCIUM 8.7 11/08/2023 1204   PROT 6.8 11/08/2023 1204   ALBUMIN 3.4 (L) 11/08/2023 1204   AST 133 (H) 11/08/2023 1204   AST 116 (H) 11/08/2023 1204   ALT 40 (H) 11/08/2023 1204   ALT 38 (H) 11/08/2023 1204   ALKPHOS 262 (H) 11/08/2023 1204   BILITOT 1.6 (H) 11/08/2023 1204   GFRNONAA 25 (L) 04/10/2023 1456   GFRNONAA 75 01/17/2021 0923   GFRAA 87 01/17/2021 0923     Lab Results  Component Value Date   WBC 6.8 11/08/2023   HGB 10.4 (L) 11/08/2023   HCT 30.3 (L) 11/08/2023   MCV 104.3 (H) 11/08/2023   PLT 123 (L) 11/08/2023   PLT 128.0 (L) 11/08/2023    Lab Results  Component Value Date   CHOL 144 11/08/2023   HDL 76 01/22/2023   LDLCALC 56 01/22/2023   TRIG 121 11/08/2023   CHOLHDL 2.0 01/22/2023    Lab Results  Component Value Date   HGBA1C 5.7 (H) 04/02/2023     Lab Results  Component Value Date   TSH 1.56 04/02/2023       Assessment & Plan:   Cough: Symptoms first began Saturday with a sore throat that progressed to a productive cough with expectoration of excessive clear mucus, nasal/chest congestion, rattling/wheezing. She also endorses experiencing chills, shakiness, and a fever of 101 yesterday and the day before. She was taking OTC cough medication for her symptoms. She is not UTD on her flu vaccination. Flu test done in-office was NEG. 1 g Rocephin administered. Chest X-ray scheduled, we will contact you with results when available Sending in 250 mg Levofloxacin - take 1 tablet daily for 7-10 days. Also sending in 150 mg Diflucan in case of candida vaginitis - take 1 tablet after completing antibiotics if needed, and 5ml Hycodan for cough - take a  teaspoon every 8 hours as needed for cough.   Hypertension: treated with 5 mg Amlodipine, 20 mg Lasix, and 25 mg Metoprolol succinate daily. She mentions that she would like to go  back to a Amlodipine-Hydrochlorothiazide combination prescription. Her BP today was 100/60.   Colonoscopy:  was supposed to be this morning as 3 weeks ago she experienced rectal hemorrhaging during a BM. She did follow-up with Enid Cutter, MD with GI who did a physical exam that indicated she has some hemorrhoids. Reschedule colonoscopy once you're feeling improved.     I,Emily Lagle,acting as a Neurosurgeon for Margaree Mackintosh, MD.,have documented all relevant documentation on the behalf of Margaree Mackintosh, MD,as directed by  Margaree Mackintosh, MD while in the presence of Margaree Mackintosh, MD.   I, Margaree Mackintosh, MD, have reviewed all documentation for this visit. The documentation on 11/25/23 for the exam, diagnosis, procedures, and orders are all accurate and complete.

## 2023-11-15 NOTE — Telephone Encounter (Signed)
Thanks for letting me know I hope she knows that she needs to reschedule when she gets better RG

## 2023-11-15 NOTE — Patient Instructions (Addendum)
Possible One gram IM Rocephin given in office.  Patient will take 250 mg Levaquin daily for 7 to 10 days.  May take Diflucan if needed for Candida vaginitis.  Chest x-ray ordered.  May take Hycodan 1 teaspoon every 8 hours as needed for cough.  Patient prefers to discontinue Lasix.  She will keep amlodipine and metoprolol but wants to take HCTZ instead of Lasix.

## 2023-11-15 NOTE — Telephone Encounter (Signed)
Good Morning Dr. Chales Abrahams  I called this patient today and spoke with her regarding her procedure.  She stated that she was sick and had called in on Monday to cancel the appointment.  She has an upper respiratory infection and running a fever.

## 2023-11-16 ENCOUNTER — Ambulatory Visit: Payer: Medicare Other | Admitting: Internal Medicine

## 2023-11-19 ENCOUNTER — Other Ambulatory Visit: Payer: Self-pay | Admitting: Internal Medicine

## 2023-11-25 ENCOUNTER — Encounter: Payer: Self-pay | Admitting: Internal Medicine

## 2023-12-06 ENCOUNTER — Ambulatory Visit (HOSPITAL_COMMUNITY): Payer: Medicare Other

## 2023-12-06 NOTE — Telephone Encounter (Signed)
 Note from CT stating that patient no showed her CT appointment today. Letter sent to patient

## 2024-01-28 ENCOUNTER — Inpatient Hospital Stay: Payer: Medicare Other

## 2024-01-28 ENCOUNTER — Other Ambulatory Visit: Payer: Medicare Other

## 2024-01-28 ENCOUNTER — Inpatient Hospital Stay: Payer: Medicare Other | Admitting: Oncology

## 2024-02-16 ENCOUNTER — Other Ambulatory Visit: Payer: Self-pay | Admitting: Internal Medicine

## 2024-03-12 ENCOUNTER — Other Ambulatory Visit: Payer: Self-pay | Admitting: Cardiology

## 2024-03-21 ENCOUNTER — Telehealth: Payer: Self-pay | Admitting: Oncology

## 2024-03-21 ENCOUNTER — Inpatient Hospital Stay: Attending: Oncology

## 2024-03-21 ENCOUNTER — Inpatient Hospital Stay: Admitting: Oncology

## 2024-03-21 VITALS — BP 130/74 | HR 92 | Temp 98.1°F | Resp 18 | Ht 64.0 in | Wt 136.6 lb

## 2024-03-21 DIAGNOSIS — K7460 Unspecified cirrhosis of liver: Secondary | ICD-10-CM | POA: Diagnosis not present

## 2024-03-21 DIAGNOSIS — N289 Disorder of kidney and ureter, unspecified: Secondary | ICD-10-CM | POA: Insufficient documentation

## 2024-03-21 DIAGNOSIS — C188 Malignant neoplasm of overlapping sites of colon: Secondary | ICD-10-CM

## 2024-03-21 DIAGNOSIS — E039 Hypothyroidism, unspecified: Secondary | ICD-10-CM | POA: Insufficient documentation

## 2024-03-21 DIAGNOSIS — I10 Essential (primary) hypertension: Secondary | ICD-10-CM | POA: Diagnosis not present

## 2024-03-21 DIAGNOSIS — E785 Hyperlipidemia, unspecified: Secondary | ICD-10-CM | POA: Insufficient documentation

## 2024-03-21 DIAGNOSIS — Z85048 Personal history of other malignant neoplasm of rectum, rectosigmoid junction, and anus: Secondary | ICD-10-CM | POA: Insufficient documentation

## 2024-03-21 LAB — CEA (ACCESS): CEA (CHCC): 6.5 ng/mL — ABNORMAL HIGH (ref 0.00–5.00)

## 2024-03-21 NOTE — Telephone Encounter (Signed)
 Patient has been scheduled for follow-up visit per 03/21/24 LOS.  LVM notifying pt of appt details, provided my direct number to pt if appt changes need to be made.

## 2024-03-21 NOTE — Progress Notes (Signed)
 [facility] OFFICE PROGRESS NOTE   Diagnosis: Rectal cancer  INTERVAL HISTORY:   Jeanne Smith returns as scheduled.  She feels well.  Good appetite.  No difficulty with bowel function.  No bleeding.  She is rescheduling a colonoscopy with Dr. Venice Gillis.  She missed a scheduled colonoscopy due to the illness of her mother.  Objective:  Vital signs in last 24 hours:  Blood pressure 130/74, pulse 92, temperature 98.1 F (36.7 C), temperature source Temporal, resp. rate 18, height 5\' 4"  (1.626 m), weight 136 lb 9.6 oz (62 kg), SpO2 100%.    Lymphatics: No cervical, supraclavicular, axillary, or inguinal nodes Resp: Lungs clear bilaterally Cardio: Regular rate and rhythm GI: No hepatosplenomegaly, mildly distended, no apparent ascites, no mass, nontender Vascular: No leg edema  Skin: Telangiectasias at the upper chest and back  Portacath/PICC-without erythema  Lab Results:  Lab Results  Component Value Date   WBC 6.8 11/08/2023   HGB 10.4 (L) 11/08/2023   HCT 30.3 (L) 11/08/2023   MCV 104.3 (H) 11/08/2023   PLT 123 (L) 11/08/2023   PLT 128.0 (L) 11/08/2023   NEUTROABS 4.8 11/08/2023    CMP  Lab Results  Component Value Date   NA 139 11/08/2023   K 2.8 (LL) 11/08/2023   CL 102 11/08/2023   CO2 24 11/08/2023   GLUCOSE 140 (H) 11/08/2023   BUN 12 11/08/2023   CREATININE 1.42 (H) 11/08/2023   CALCIUM  8.7 11/08/2023   PROT 6.8 11/08/2023   ALBUMIN 3.4 (L) 11/08/2023   AST 133 (H) 11/08/2023   AST 116 (H) 11/08/2023   ALT 40 (H) 11/08/2023   ALT 38 (H) 11/08/2023   ALKPHOS 262 (H) 11/08/2023   BILITOT 1.6 (H) 11/08/2023   GFRNONAA 25 (L) 04/10/2023   GFRAA 87 01/17/2021    Lab Results  Component Value Date   CEA 6.50 (H) 03/21/2024   Medications: I have reviewed the patient's current medications.   Assessment/Plan: Rectal cancer Mass at 12-14 cm from the anal verge on colonoscopy 05/03/2022-invasive well to moderately differentiated adenocarcinoma  arising in an adenoma with high-grade dysplasia CTs 05/12/2022-small polypoid lesion and masslike area of thickening in the ascending colon, no evidence of metastatic disease, changes of cirrhosis and hepatic steatosis Colonoscopy 05/25/2022-nonobstructing mass at 12-14 cm, tattoo at the distal end, 6 mm polyp in the proximal transverse colon no right colon mass lesion Pelvic MRI 06/06/2022-no primary rectal tumor visualized, MRI stage T1/T2, N0 Robotic low anterior resection 07/05/2022-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 Tubular adenoma 05/03/2022, sessile serrated adenoma 05/25/2022, histologic description of gross polyps on gross specimen not described histologically on final pathology report 3.  Changes of cirrhosis on CT 05/12/2022 4.  Alcohol  use 5.  Hyperlipidemia 6.  Hypertension 7.  Chronic back pain 8.  Hypothyroidism 9.  Renal insufficiency      Disposition: Jeanne Smith is in clinical remission from rectal cancer.  She is scheduling a surveillance colonoscopy with Dr. Venice Gillis.  She is also followed by Dr. Venice Gillis for cirrhosis.  The AFP was mildly elevated in December 2024.  The CEA is mildly elevated today.  These may be benign nonspecific findings or indicative of malignancy.  She will return for a CEA and AFP in 6 weeks.  She will be scheduled for  an office and lab visit in 3 months.  Coni Deep, MD  03/21/2024  11:23 AM

## 2024-03-26 ENCOUNTER — Encounter: Payer: Self-pay | Admitting: Gastroenterology

## 2024-03-26 ENCOUNTER — Telehealth: Payer: Self-pay

## 2024-03-26 NOTE — Telephone Encounter (Signed)
-----   Message from Lajuan Pila sent at 03/25/2024 12:14 PM EDT ----- Regarding: Thanks Absolutely, [name], Needs surveillance colon in [facility] ----- Message ----- From: Sumner Ends, MD Sent: 03/21/2024  12:20 PM EDT To: Lajuan Pila, MD  The CEA is mildly elevated today.  AFP was mildly elevated in December 2024.  I will have her return for a CEA and AFP in 6 weeks.  She is due for a surveillance colonoscopy, I know she had to cancel a scheduled colonoscopy earlier this year  Thanks,  Rodman Clam

## 2024-03-26 NOTE — Telephone Encounter (Signed)
 Left message for pt to call back

## 2024-03-26 NOTE — Telephone Encounter (Signed)
 Pt called back. Pt to be scheduled for a colonoscopy and previsit appointment by our scheduling team.

## 2024-04-23 ENCOUNTER — Ambulatory Visit (AMBULATORY_SURGERY_CENTER)

## 2024-04-23 VITALS — Ht 64.0 in | Wt 136.0 lb

## 2024-04-23 DIAGNOSIS — Z85038 Personal history of other malignant neoplasm of large intestine: Secondary | ICD-10-CM

## 2024-04-23 DIAGNOSIS — Z860100 Personal history of colon polyps, unspecified: Secondary | ICD-10-CM

## 2024-04-23 NOTE — Progress Notes (Signed)
 No egg or soy allergy known to patient  No issues known to pt with past sedation with any surgeries or procedures Patient denies ever being told they had issues or difficulty with intubation  No FH of Malignant Hyperthermia Pt is not on diet pills Pt is not on  home 02  Pt is not on blood thinners  Pt denies issues with constipation  Hx of Afib with colonoscopy, "states it was due to suprep"  Have any cardiac testing pending--No Pt can ambulate  Pt denies use of chewing tobacco Discussed diabetic I weight loss medication holds Discussed NSAID holds Checked BMI Pt instructed to use Single[employer] or GoodRx for a price reduction on prep  Patient's chart reviewed by Rogena Class CNRA prior to previsit and patient appropriate for the [facility].  Pre visit completed and red dot placed by patient's name on their procedure day (on provider's schedule).

## 2024-04-28 DIAGNOSIS — Z124 Encounter for screening for malignant neoplasm of cervix: Secondary | ICD-10-CM | POA: Diagnosis not present

## 2024-04-28 DIAGNOSIS — Z6823 Body mass index (BMI) 23.0-23.9, adult: Secondary | ICD-10-CM | POA: Diagnosis not present

## 2024-04-28 DIAGNOSIS — Z1272 Encounter for screening for malignant neoplasm of vagina: Secondary | ICD-10-CM | POA: Diagnosis not present

## 2024-05-02 ENCOUNTER — Other Ambulatory Visit

## 2024-05-13 ENCOUNTER — Ambulatory Visit: Admitting: Gastroenterology

## 2024-05-13 ENCOUNTER — Encounter: Payer: Self-pay | Admitting: Gastroenterology

## 2024-05-13 VITALS — BP 129/66 | HR 84 | Temp 98.1°F | Resp 13 | Ht 64.0 in | Wt 136.0 lb

## 2024-05-13 DIAGNOSIS — K635 Polyp of colon: Secondary | ICD-10-CM

## 2024-05-13 DIAGNOSIS — Z1211 Encounter for screening for malignant neoplasm of colon: Secondary | ICD-10-CM

## 2024-05-13 DIAGNOSIS — Q438 Other specified congenital malformations of intestine: Secondary | ICD-10-CM

## 2024-05-13 DIAGNOSIS — Z85038 Personal history of other malignant neoplasm of large intestine: Secondary | ICD-10-CM | POA: Diagnosis not present

## 2024-05-13 DIAGNOSIS — D125 Benign neoplasm of sigmoid colon: Secondary | ICD-10-CM

## 2024-05-13 DIAGNOSIS — K640 First degree hemorrhoids: Secondary | ICD-10-CM | POA: Diagnosis not present

## 2024-05-13 DIAGNOSIS — D120 Benign neoplasm of cecum: Secondary | ICD-10-CM | POA: Diagnosis not present

## 2024-05-13 DIAGNOSIS — F419 Anxiety disorder, unspecified: Secondary | ICD-10-CM | POA: Diagnosis not present

## 2024-05-13 DIAGNOSIS — D122 Benign neoplasm of ascending colon: Secondary | ICD-10-CM | POA: Diagnosis not present

## 2024-05-13 DIAGNOSIS — I10 Essential (primary) hypertension: Secondary | ICD-10-CM | POA: Diagnosis not present

## 2024-05-13 DIAGNOSIS — K625 Hemorrhage of anus and rectum: Secondary | ICD-10-CM | POA: Diagnosis not present

## 2024-05-13 DIAGNOSIS — D124 Benign neoplasm of descending colon: Secondary | ICD-10-CM

## 2024-05-13 DIAGNOSIS — D123 Benign neoplasm of transverse colon: Secondary | ICD-10-CM

## 2024-05-13 DIAGNOSIS — E039 Hypothyroidism, unspecified: Secondary | ICD-10-CM | POA: Diagnosis not present

## 2024-05-13 DIAGNOSIS — Z09 Encounter for follow-up examination after completed treatment for conditions other than malignant neoplasm: Secondary | ICD-10-CM | POA: Diagnosis not present

## 2024-05-13 MED ORDER — SODIUM CHLORIDE 0.9 % IV SOLN: 500.0000 mL | Freq: Once | INTRAVENOUS | Status: DC

## 2024-05-13 MED ORDER — HYDROCORTISONE (PERIANAL) 2.5 % EX CREA: 1.0000 | TOPICAL_CREAM | Freq: Two times a day (BID) | CUTANEOUS | 2 refills | Status: DC

## 2024-05-13 NOTE — Progress Notes (Unsigned)
 [facility] History and Physical   Primary Care Physician:  Perri Ronal PARAS, MD   Reason for Procedure:   Rectal Ca s/p  robotic low anterior resection 06/2022. H/O polyps  Plan:    colon     HPI: Jeanne Smith is a 73 y.o. female    Past Medical History:  Diagnosis Date   Anxiety    Arthritis    oa   Attention deficit disorder    Cancer [facility])    Dysrhythmia 2019   episode of palpitations x 25 January 2018, stress test normal   H/O seasonal allergies    Hyperlipidemia    Hypertension    Hypothyroidism    thyroid  nodules, off synthroid > 15 years   Insomnia    Osteoarthritis    Osteopenia    Palpitations    Pneumonia    Thyroid  nodule     Past Surgical History:  Procedure Laterality Date   ABDOMINAL HYSTERECTOMY     AUGMENTATION MAMMAPLASTY Bilateral    silicone over 30 years   COLONOSCOPY     COLONOSCOPY WITH PROPOFOL  N/A 05/25/2022   Procedure: COLONOSCOPY WITH PROPOFOL ;  Surgeon: Charlanne Groom, MD;  Location: [facility];  Service: Gastroenterology;  Laterality: N/A;   CONVERSION TO TOTAL HIP Left 05/16/2018   Procedure: LEFT HIP ANTERIOR APPROACH;  Surgeon: Ernie Cough, MD;  Location: [facility];  Service: Orthopedics;  Laterality: Left;   FEMUR IM NAIL Right 06/16/2015   Procedure: INTRAMEDULLARY (IM) NAIL FEMORAL;  Surgeon: Evalene JONETTA Chancy, MD;  Location: [facility];  Service: Orthopedics;  Laterality: Right;   HARDWARE REMOVAL Right 09/24/2020   Procedure: REMOVAL OF IM ROD/HIP SCREW RIGHT HIP;  Surgeon: Vernetta Lonni GRADE, MD;  Location: [facility];  Service: Orthopedics;  Laterality: Right;   HARDWARE REMOVAL Right 12/24/2020   Procedure: HARDWARE REMOVAL RIGHT WRIST;  Surgeon: Jerri Kay HERO, MD;  Location: [facility];  Service: Orthopedics;  Laterality: Right;   HIP SURGERY     JOINT REPLACEMENT     ORIF RADIAL FRACTURE Right 12/24/2020   Procedure: OPEN REDUCTION INTERNAL FIXATION (ORIF) RIGHT RADIAL SHAFT FRACTURE;  Surgeon: Jerri Kay HERO, MD;  Location: [facility];  Service: Orthopedics;  Laterality: Right;   POLYPECTOMY  05/25/2022   Procedure: POLYPECTOMY;  Surgeon: Charlanne Groom, MD;  Location: [facility];  Service: Gastroenterology;;   TONSILLECTOMY     TOTAL HIP ARTHROPLASTY Right 09/24/2020   Procedure: RIGHT TOTAL HIP ARTHROPLASTY ANTERIOR APPROACH;  Surgeon: Vernetta Lonni GRADE, MD;  Location: [facility];  Service: Orthopedics;  Laterality: Right;   TUBAL LIGATION     XI ROBOTIC ASSISTED LOWER ANTERIOR RESECTION N/A 07/05/2022   Procedure: XI ROBOTIC ASSISTED LOWER ANTERIOR RESECTION;  Surgeon: Debby Hila, MD;  Location: [facility];  Service: General;  Laterality: N/A;    Prior to Admission medications   Medication Sig Start Date End Date Taking? Authorizing Provider  ALPRAZolam  (XANAX ) 0.5 MG tablet Take 0.5 mg by mouth as needed for anxiety.   Yes [provider]  amLODipine  (NORVASC ) 5 MG tablet TAKE 1 TABLET(5 MG) BY MOUTH DAILY 02/18/24  Yes Baxley, Ronal PARAS, MD  aspirin  81 MG chewable tablet Chew 1 tablet (81 mg total) by mouth 2 (two) times daily. 09/25/20  Yes Vernetta Lonni GRADE, MD  b complex vitamins capsule Take 1 capsule by mouth daily.   Yes [provider]  Doxylamine  Succinate, Sleep, (SLEEP AID PO) Take 1 tablet by mouth at bedtime.  Yes [provider]  estradiol  (ESTRACE ) 2 MG tablet Take 2 mg by mouth at bedtime.  04/21/19  Yes [provider]  furosemide  (LASIX ) 20 MG tablet TAKE 1 TABLET(20 MG) BY MOUTH DAILY 08/21/23  Yes Baxley, Ronal PARAS, MD  Hypromellose, PF, (RETAINE [facility]) 0.3 % SOLN Place 1 drop into both eyes 3 (three) times daily as needed (for dryness).   Yes [provider]  Magnesium  250 MG TABS Take 250 mg by mouth at bedtime.   Yes [provider]  metoprolol  succinate (TOPROL -XL) 25 MG 24 hr tablet TAKE 1 TABLET(25 MG) BY MOUTH DAILY 03/12/24  Yes [place], Peter M, MD  Vitamin D , Ergocalciferol , (DRISDOL ) 1.25  MG (50000 UNIT) CAPS capsule TAKE 1 CAPSULE BY MOUTH EVERY 7 DAYS 10/08/23  Yes Baxley, Ronal PARAS, MD  docusate sodium  (COLACE) 100 MG capsule Take 100 mg by mouth daily. Patient not taking: Reported on 03/21/2024    [provider]  metroNIDAZOLE (METROGEL) 1 % gel Apply 1 Application topically as needed. 06/18/23   [provider]  mupirocin  ointment (BACTROBAN ) 2 % Apply 1 Application topically 2 (two) times daily. 09/10/23   Perri Ronal PARAS, MD  PREVIDENT 5000 BOOSTER PLUS 1.1 % PSTE USE TO BRUSH TEETH AFTER MEALS AND BEFORE BEDTIME 05/29/23   [provider]  Saline 0.9 % AERS Place 1 spray into both nostrils as needed (congestion).    [provider]  triamcinolone  0.1% oint-Eucerin equivalent cream 1:1 mixture Apply topically daily as needed. Patient not taking: Reported on 04/23/2024 01/24/23   Perri Ronal PARAS, MD    Current Outpatient Medications  Medication Sig Dispense Refill   ALPRAZolam  (XANAX ) 0.5 MG tablet Take 0.5 mg by mouth as needed for anxiety.     amLODipine  (NORVASC ) 5 MG tablet TAKE 1 TABLET(5 MG) BY MOUTH DAILY 90 tablet 0   aspirin  81 MG chewable tablet Chew 1 tablet (81 mg total) by mouth 2 (two) times daily. 30 tablet 0   b complex vitamins capsule Take 1 capsule by mouth daily.     Doxylamine  Succinate, Sleep, (SLEEP AID PO) Take 1 tablet by mouth at bedtime.     estradiol  (ESTRACE ) 2 MG tablet Take 2 mg by mouth at bedtime.      furosemide  (LASIX ) 20 MG tablet TAKE 1 TABLET(20 MG) BY MOUTH DAILY 90 tablet 1   Hypromellose, PF, (RETAINE [facility]) 0.3 % SOLN Place 1 drop into both eyes 3 (three) times daily as needed (for dryness).     Magnesium  250 MG TABS Take 250 mg by mouth at bedtime.     metoprolol  succinate (TOPROL -XL) 25 MG 24 hr tablet TAKE 1 TABLET(25 MG) BY MOUTH DAILY 30 tablet 0   Vitamin D , Ergocalciferol , (DRISDOL ) 1.25 MG (50000 UNIT) CAPS capsule TAKE 1 CAPSULE BY MOUTH EVERY 7 DAYS 12 capsule 3   docusate sodium  (COLACE) 100 MG  capsule Take 100 mg by mouth daily. (Patient not taking: Reported on 03/21/2024)     metroNIDAZOLE (METROGEL) 1 % gel Apply 1 Application topically as needed.     mupirocin  ointment (BACTROBAN ) 2 % Apply 1 Application topically 2 (two) times daily. 22 g 0   PREVIDENT 5000 BOOSTER PLUS 1.1 % PSTE USE TO BRUSH TEETH AFTER MEALS AND BEFORE BEDTIME     Saline 0.9 % AERS Place 1 spray into both nostrils as needed (congestion).     triamcinolone  0.1% oint-Eucerin equivalent cream 1:1 mixture Apply topically daily as needed. (Patient not taking: Reported  on 04/23/2024) 60 g 1   Current Facility-Administered Medications  Medication Dose Route Frequency Provider Last Rate Last Admin   0.9 %  sodium chloride  infusion  500 mL Intravenous Once Charlanne Groom, MD        Allergies as of 05/13/2024   (No Known Allergies)    Family History  Problem Relation Age of Onset   Heart failure Mother    Hypertension Father    Cancer Father        lung   Stroke Father    Heart disease Brother    Alcoholism Brother    Colon cancer Neg Hx    Pancreatic cancer Neg Hx    Esophageal cancer Neg Hx    Liver cancer Neg Hx    Stomach cancer Neg Hx     Social History   Socioeconomic History   Marital status: Widowed    Spouse name: Not on file   Number of children: 3   Years of education: Not on file   Highest education level: Not on file  Occupational History   Not on file  Tobacco Use   Smoking status: Former    Current packs/day: 0.00    Types: Cigarettes    Quit date: 11/27/2005    Years since quitting: 18.4   Smokeless tobacco: Never  Vaping Use   Vaping status: Never Used  Substance and Sexual Activity   Alcohol  use: Yes    Alcohol /week: 14.0 standard drinks of alcohol     Types: 14 Glasses of wine per week    Comment: scotch or wine daily   Drug use: No   Sexual activity: Not on file  Other Topics Concern   Not on file  Social History Narrative   Not on file   Social Drivers of Health    Financial Resource Strain: Not on file  Food Insecurity: Not on file  Transportation Needs: Not on file  Physical Activity: Not on file  Stress: Not on file  Social Connections: Not on file  Intimate Partner Violence: Not on file    Review of Systems: Positive for none All other review of systems negative except as mentioned in the HPI.  Physical Exam: Vital signs in last 24 hours: @VSRANGES @   General:   Alert,  Well-developed, well-nourished, pleasant and cooperative in NAD Lungs:  Clear throughout to auscultation.   Heart:  Regular rate and rhythm; no murmurs, clicks, rubs,  or gallops. Abdomen:  Soft, nontender and nondistended. Normal bowel sounds.   Neuro/Psych:  Alert and cooperative. Normal mood and affect. A and O x 3    No significant changes were identified.  The patient continues to be an appropriate candidate for the planned procedure and anesthesia.   Anselm Charlanne, MD. [facility] 05/13/2024 3:57 PM@

## 2024-05-13 NOTE — Op Note (Signed)
 [facility] Patient Name: Jeanne Smith Procedure Date: 05/13/2024 3:55 PM MRN: [id number] Endoscopist: Lajuan Pila , MD, [id number] Age: 73 Referring MD:  Date of Birth: 04-23-1951 Gender: Female Account #: [id number] Procedure:                Colonoscopy Indications:              High risk colon cancer surveillance: 1. Rectal Ca                            s/p robotic low anterior resection 06/2022. 2. H/O                            polyps Medicines:                Monitored Anesthesia Care Procedure:                Pre-Anesthesia Assessment:                           - Prior to the procedure, a History and Physical                            was performed, and patient medications and                            allergies were reviewed. The patient's tolerance of                            previous anesthesia was also reviewed. The risks                            and benefits of the procedure and the sedation                            options and risks were discussed with the patient.                            All questions were answered, and informed consent                            was obtained. Prior Anticoagulants: The patient has                            taken no anticoagulant or antiplatelet agents. ASA                            Grade Assessment: II - A patient with mild systemic                            disease. After reviewing the risks and benefits,                            the patient was deemed in satisfactory condition to  undergo the procedure.                           After obtaining informed consent, the colonoscope                            was passed under direct vision. Throughout the                            procedure, the patient's blood pressure, pulse, and                            oxygen saturations were monitored continuously. The                            Olympus Scope PCF [id number] was introduced through                             the anus and advanced to the the cecum, identified                            by appendiceal orifice and ileocecal valve. The                            colonoscopy was somewhat difficult due to quality                            of prep and a tortuous colon. Successful completion                            of the procedure was aided by applying abdominal                            pressure and lavage. The patient tolerated the                            procedure well. The quality of the bowel                            preparation was adequate to identify polyps.                            Overall over 90 to 95% of the colonic mucosa was                            visualized satisfactorily. The ileocecal valve,                            appendiceal orifice, and rectum were photographed. Scope In: 4:04:54 PM Scope Out: 4:44:33 PM Scope Withdrawal Time: 0 hours 31 minutes 23 seconds  Total Procedure Duration: 0 hours 39 minutes 39 seconds  Findings:                 Three sessile polyps were found in the  proximal                            ascending colon, mid ascending colon and cecum. The                            polyps were 4 to 6 mm in size. These polyps were                            removed with a cold snare. Resection and retrieval                            were complete.                           A 10 mm polyp was found in the proximal transverse                            colon. The polyp was semi-sessile. The polyp was                            removed with a hot snare. Resection and retrieval                            were complete.                           A 10 mm polyp was found in the mid descending                            colon. The polyp was semi-sessile. The polyp was                            removed with a hot snare. Resection and retrieval                            were complete.                           A 4 mm polyp was found in  the proximal sigmoid                            colon. The polyp was sessile. The polyp was removed                            with a cold snare. Resection and retrieval were                            complete.                           There was evidence of a prior end-to-side  colo-colonic anastomosis at 10 cm from the anal                            verge (in the recto-sigmoid colon and in the distal                            sigmoid colon). This was patent and was                            characterized by healthy appearing mucosa. No                            recurrence.                           Non-bleeding internal hemorrhoids were found during                            retroflexion and during perianal exam. The                            hemorrhoids were small and Grade I (internal                            hemorrhoids that do not prolapse).                           The exam was otherwise without abnormality on                            direct and retroflexion views. Complications:            No immediate complications. Estimated Blood Loss:     Estimated blood loss: none. Impression:               - Three 4 to 6 mm polyps in the proximal ascending                            colon, in the mid ascending colon and in the cecum,                            removed with a cold snare. Resected and retrieved.                           - One 10 mm polyp in the proximal transverse colon,                            removed with a hot snare. Resected and retrieved.                           - One 10 mm polyp in the mid descending colon,                            removed with a hot snare. Resected and retrieved.                           -  One 4 mm polyp in the proximal sigmoid colon,                            removed with a cold snare. Resected and retrieved.                           - Patent end-to-side colo-colonic anastomosis,                             characterized by healthy appearing mucosa. No local                            recurrence.                           - Non-bleeding internal hemorrhoids.                           - The examination was otherwise normal on direct                            and retroflexion views. Recommendation:           - Patient has a contact number available for                            emergencies. The signs and symptoms of potential                            delayed complications were discussed with the                            patient. Return to normal activities tomorrow.                            Written discharge instructions were provided to the                            patient.                           - Resume previous diet.                           - Continue present medications.                           - Await pathology results.                           - Repeat colonoscopy for surveillance based on                            pathology results.                           - No aspirin , ibuprofen, naproxen, or other  non-steroidal anti-inflammatory drugs for 5 days                            after polyp removal.                           - The findings and recommendations were discussed                            with the patient's family. Lajuan Pila, MD 05/13/2024 4:57:31 PM This report has been signed electronically.

## 2024-05-13 NOTE — Patient Instructions (Addendum)
-   6 polyps removed and sent to pathology - Resume previous diet. - Continue present medications. - Await pathology results. - Repeat colonoscopy for surveillance based on    pathology results. - No aspirin , ibuprofen, naproxen, or other    non-steroidal anti-inflammatory drugs for 5 days    after polyp removal. - The findings and recommendations were discussed    with the patient's family.  YOU HAD AN ENDOSCOPIC PROCEDURE TODAY AT THE [facility]:   Refer to the procedure report that was given to you for any specific questions about what was found during the examination.  If the procedure report does not answer your questions, please call your gastroenterologist to clarify.  If you requested that your care partner not be given the details of your procedure findings, then the procedure report has been included in a sealed envelope for you to review at your convenience later.  YOU SHOULD EXPECT: Some feelings of bloating in the abdomen. Passage of more gas than usual.  Walking can help get rid of the air that was put into your GI tract during the procedure and reduce the bloating. If you had a lower endoscopy (such as a colonoscopy or flexible sigmoidoscopy) you may notice spotting of blood in your stool or on the toilet paper. If you underwent a bowel prep for your procedure, you may not have a normal bowel movement for a few days.  Please Note:  You might notice some irritation and congestion in your nose or some drainage.  This is from the oxygen used during your procedure.  There is no need for concern and it should clear up in a day or so.  SYMPTOMS TO REPORT IMMEDIATELY:  Following lower endoscopy (colonoscopy or flexible sigmoidoscopy):  Excessive amounts of blood in the stool  Significant tenderness or worsening of abdominal pains  Swelling of the abdomen that is new, acute  Fever of 100F or higher   For urgent or emergent issues, a gastroenterologist can be reached  at any hour by calling [phone]. Do not use MyChart messaging for urgent concerns.    DIET:  We do recommend a small meal at first, but then you may proceed to your regular diet.  Drink plenty of fluids but you should avoid alcoholic beverages for 24 hours.  ACTIVITY:  You should plan to take it easy for the rest of today and you should NOT DRIVE or use heavy machinery until tomorrow (because of the sedation medicines used during the test).    FOLLOW UP: Our staff will call the number listed on your records the next business day following your procedure.  We will call around 7:15- 8:00 am to check on you and address any questions or concerns that you may have regarding the information given to you following your procedure. If we do not reach you, we will leave a message.     If any biopsies were taken you will be contacted by phone or by letter within the next 1-3 weeks.  Please call us  at [phone] if you have not heard about the biopsies in 3 weeks.    SIGNATURES/CONFIDENTIALITY: You and/or your care partner have signed paperwork which will be entered into your electronic medical record.  These signatures attest to the fact that that the information above on your After Visit Summary has been reviewed and is understood.  Full responsibility of the confidentiality of this discharge information lies with you and/or your care-partner.

## 2024-05-13 NOTE — Progress Notes (Unsigned)
 A/o x 3, VSS, gd SR's, pleased with anesthesia, report to RN

## 2024-05-13 NOTE — Progress Notes (Unsigned)
 Called to room to assist during endoscopic procedure.  Patient ID and intended procedure confirmed with present staff. Received instructions for my participation in the procedure from the performing physician.

## 2024-05-13 NOTE — Progress Notes (Signed)
 Pt's states no medical or surgical changes since previsit or office visit.

## 2024-05-14 ENCOUNTER — Telehealth: Payer: Self-pay

## 2024-05-14 NOTE — Telephone Encounter (Signed)
  Follow up Call-     05/13/2024    3:18 PM 05/03/2022    1:57 PM  Call back number  Post procedure Call Back phone  # [phone]  Permission to leave phone message Yes Yes     Patient questions:  Do you have a fever, pain , or abdominal swelling? No. Pain Score  0 *  Have you tolerated food without any problems? Yes.    Have you been able to return to your normal activities? Yes.    Do you have any questions about your discharge instructions: Diet   No. Medications  No. Follow up visit  No.  Do you have questions or concerns about your Care? No.  Actions: * If pain score is 4 or above: No action needed, pain <4.

## 2024-05-16 LAB — SURGICAL PATHOLOGY

## 2024-05-18 ENCOUNTER — Ambulatory Visit: Payer: Self-pay | Admitting: Gastroenterology

## 2024-05-20 ENCOUNTER — Inpatient Hospital Stay: Attending: Oncology

## 2024-05-21 ENCOUNTER — Other Ambulatory Visit: Payer: Self-pay | Admitting: Internal Medicine

## 2024-05-22 ENCOUNTER — Other Ambulatory Visit: Payer: Self-pay

## 2024-05-22 DIAGNOSIS — I10 Essential (primary) hypertension: Secondary | ICD-10-CM

## 2024-05-22 MED ORDER — AMLODIPINE BESYLATE 5 MG PO TABS: 5.0000 mg | ORAL_TABLET | Freq: Every day | ORAL | 0 refills | Status: DC

## 2024-06-12 ENCOUNTER — Telehealth: Payer: Self-pay | Admitting: Internal Medicine

## 2024-06-12 ENCOUNTER — Other Ambulatory Visit: Payer: Self-pay | Admitting: Internal Medicine

## 2024-06-12 MED ORDER — METOPROLOL SUCCINATE ER 25 MG PO TB24: 25.0000 mg | ORAL_TABLET | Freq: Every day | ORAL | 0 refills | Status: DC

## 2024-06-12 NOTE — Telephone Encounter (Signed)
 Rx was sent

## 2024-06-12 NOTE — Telephone Encounter (Signed)
Medication unable to be pended

## 2024-06-12 NOTE — Telephone Encounter (Unsigned)
 Copied from CRM [id number]. Topic: Clinical - Medication Refill >> Jun 12, 2024 12:03 PM Deleta S wrote: Medication: Metoprolol  succinate 25 mg  Has the patient contacted their pharmacy? No (Agent: If no, request that the patient contact the pharmacy for the refill. If patient does not wish to contact the pharmacy document the reason why and proceed with request.) (Agent: If yes, when and what did the pharmacy advise?)  This is the patient's preferred pharmacy:  [id number] - [place], [place] - [address] AT [facility] [address] [place] [address] Phone: [phone] Fax: [phone]  Is this the correct pharmacy for this prescription? Yes If no, delete pharmacy and type the correct one.   Has the prescription been filled recently? No  Is the patient out of the medication? Yes 2 more remaining   Has the patient been seen for an appointment in the last year OR does the patient have an upcoming appointment? Yes  Can we respond through MyChart? No  Agent: Please be advised that Rx refills may take up to 3 business days. We ask that you follow-up with your pharmacy.

## 2024-06-12 NOTE — Telephone Encounter (Signed)
 Copied from CRM [id number]. Topic: Clinical - Medication Refill >> Jun 12, 2024 12:03 PM Deleta S wrote: Medication: Metoprolol  succinate 25 mg  Has the patient contacted their pharmacy? No (Agent: If no, request that the patient contact the pharmacy for the refill. If patient does not wish to contact the pharmacy document the reason why and proceed with request.) (Agent: If yes, when and what did the pharmacy advise?)  This is the patient's preferred pharmacy:  [id number] - [place], [place] - [address] AT [facility] OF [address] [place] [address] Phone: [phone] Fax: [phone]  Is this the correct pharmacy for this prescription? Yes If no, delete pharmacy and type the correct one.   Has the prescription been filled recently? No  Is the patient out of the medication? Yes 2 more remaining   Has the patient been seen for an appointment in the last year OR does the patient have an upcoming appointment? Yes  Can we respond through MyChart? No  Agent: Please be advised that Rx refills may take up to 3 business days. We ask that you follow-up with your pharmacy.

## 2024-06-13 ENCOUNTER — Ambulatory Visit: Payer: Self-pay | Admitting: Oncology

## 2024-06-13 ENCOUNTER — Inpatient Hospital Stay: Attending: Oncology | Admitting: Oncology

## 2024-06-13 ENCOUNTER — Inpatient Hospital Stay

## 2024-06-13 VITALS — BP 119/58 | HR 89 | Temp 97.9°F | Resp 18 | Ht 64.0 in | Wt 129.1 lb

## 2024-06-13 DIAGNOSIS — Z85048 Personal history of other malignant neoplasm of rectum, rectosigmoid junction, and anus: Secondary | ICD-10-CM | POA: Insufficient documentation

## 2024-06-13 DIAGNOSIS — Z79899 Other long term (current) drug therapy: Secondary | ICD-10-CM | POA: Diagnosis not present

## 2024-06-13 DIAGNOSIS — K7460 Unspecified cirrhosis of liver: Secondary | ICD-10-CM | POA: Insufficient documentation

## 2024-06-13 DIAGNOSIS — G8929 Other chronic pain: Secondary | ICD-10-CM | POA: Diagnosis not present

## 2024-06-13 DIAGNOSIS — C188 Malignant neoplasm of overlapping sites of colon: Secondary | ICD-10-CM | POA: Diagnosis not present

## 2024-06-13 DIAGNOSIS — E785 Hyperlipidemia, unspecified: Secondary | ICD-10-CM | POA: Insufficient documentation

## 2024-06-13 DIAGNOSIS — N289 Disorder of kidney and ureter, unspecified: Secondary | ICD-10-CM | POA: Diagnosis not present

## 2024-06-13 DIAGNOSIS — E039 Hypothyroidism, unspecified: Secondary | ICD-10-CM | POA: Diagnosis not present

## 2024-06-13 DIAGNOSIS — Z860100 Personal history of colon polyps, unspecified: Secondary | ICD-10-CM | POA: Insufficient documentation

## 2024-06-13 DIAGNOSIS — I10 Essential (primary) hypertension: Secondary | ICD-10-CM | POA: Insufficient documentation

## 2024-06-13 LAB — CEA (ACCESS): CEA (CHCC): 8.7 ng/mL — ABNORMAL HIGH (ref 0.00–5.00)

## 2024-06-13 NOTE — Progress Notes (Signed)
 [facility] OFFICE PROGRESS NOTE   Diagnosis: Rectal cancer  INTERVAL HISTORY:   Ms. General returns as scheduled.  Good appetite.  She had rectal bleeding prior to seeing Dr. Charlanne for a colonoscopy.  The bleeding has resolved.  She underwent a colonoscopy 05/13/2024.  Multiple polyps were removed and the pathology returned with tubular adenomas.  She saw Dr. Charlanne to consider a diagnosis cirrhosis.  A CT was ordered, but has not been performed.  Objective:  Vital signs in last 24 hours:  Blood pressure (!) 119/58, pulse 89, temperature 97.9 F (36.6 C), temperature source Oral, resp. rate 18, height 5' 4 (1.626 m), weight 129 lb 1.6 oz (58.6 kg), SpO2 100%.  Lymphatics: No cervical, supraclavicular, axillary, or inguinal nodes Resp: Lungs clear bilaterally Cardio: Regular rate and rhythm GI: No hepatosplenomegaly, no mass, nontender Vascular: No leg edema  Skin: Dry fine erythematous rash over the back  Portacath/PICC-without erythema  Lab Results:  Lab Results  Component Value Date   WBC 6.8 11/08/2023   HGB 10.4 (L) 11/08/2023   HCT 30.3 (L) 11/08/2023   MCV 104.3 (H) 11/08/2023   PLT 123 (L) 11/08/2023   PLT 128.0 (L) 11/08/2023   NEUTROABS 4.8 11/08/2023    CMP  Lab Results  Component Value Date   NA 139 11/08/2023   K 2.8 (LL) 11/08/2023   CL 102 11/08/2023   CO2 24 11/08/2023   GLUCOSE 140 (H) 11/08/2023   BUN 12 11/08/2023   CREATININE 1.42 (H) 11/08/2023   CALCIUM  8.7 11/08/2023   PROT 6.8 11/08/2023   ALBUMIN 3.4 (L) 11/08/2023   AST 133 (H) 11/08/2023   AST 116 (H) 11/08/2023   ALT 40 (H) 11/08/2023   ALT 38 (H) 11/08/2023   ALKPHOS 262 (H) 11/08/2023   BILITOT 1.6 (H) 11/08/2023   GFRNONAA 25 (L) 04/10/2023   GFRAA 87 01/17/2021    Lab Results  Component Value Date   CEA 8.70 (H) 06/13/2024      Medications: I have reviewed the patient's current medications.   Assessment/Plan: Rectal cancer Mass at 12-14 cm from  the anal verge on colonoscopy 05/03/2022-invasive well to moderately differentiated adenocarcinoma arising in an adenoma with high-grade dysplasia CTs 05/12/2022-small polypoid lesion and masslike area of thickening in the ascending colon, no evidence of metastatic disease, changes of cirrhosis and hepatic steatosis Colonoscopy 05/25/2022-nonobstructing mass at 12-14 cm, tattoo at the distal end, 6 mm polyp in the proximal transverse colon no right colon mass lesion Pelvic MRI 06/06/2022-no primary rectal tumor visualized, MRI stage T1/T2, N0 Robotic low anterior resection 07/05/2022-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 Tubular adenoma 05/03/2022, sessile serrated adenoma 05/25/2022, histologic description of gross polyps on gross specimen not described histologically on final pathology report Colonoscopy 05/13/2024: Multiple polyps removed-tubular adenomas 3.  Changes of cirrhosis on CT 05/12/2022 4.  Alcohol  use 5.  Hyperlipidemia 6.  Hypertension 7.  Chronic back pain 8.  Hypothyroidism 9.  Renal insufficiency       Disposition: Jeanne Smith was diagnosed with stage I rectal cancer in 2023.  She is in clinical remission.  The CEA was mildly elevated in April.  We will follow-up on the CEA from today.  She is being scheduled for a CT Abdo/pelvis to evaluate for changes of cirrhosis.  We will add a CT chest if the CEA returns elevated today.  [name] will return for an office visit in 6 months.  She will follow-up with Dr. Charlanne for colonoscopy surveillance and evaluation for cirrhosis.  Arley Hof, MD  06/13/2024  11:21 AM

## 2024-06-14 LAB — AFP TUMOR MARKER: AFP, Serum, Tumor Marker: 6.8 ng/mL (ref 0.0–9.2)

## 2024-06-16 ENCOUNTER — Other Ambulatory Visit: Payer: Self-pay | Admitting: *Deleted

## 2024-06-16 ENCOUNTER — Ambulatory Visit: Admitting: Internal Medicine

## 2024-06-16 ENCOUNTER — Encounter: Payer: Self-pay | Admitting: *Deleted

## 2024-06-16 DIAGNOSIS — C188 Malignant neoplasm of overlapping sites of colon: Secondary | ICD-10-CM

## 2024-06-16 NOTE — Telephone Encounter (Signed)
-----   Message from Arley Hof sent at 06/13/2024  1:15 PM EDT ----- Please call patient, the CEA is again mildly elevated, please schedule the CTs chest, abdomen, and pelvis next 2 weeks with a 30-minute office visit a few days after the CTs  Could indicate a new cancer, be related to smoke exposure, or another inflammatory process  ----- Message ----- From: Rebecka, Lab In [place] Sent: 06/13/2024  11:13 AM EDT To: Arley KATHEE Hof, MD

## 2024-06-16 NOTE — Telephone Encounter (Signed)
 Patient gave verbal understanding and aware of her CT scan appointment on 06/27/2024 arrival time at 3:45 pm.

## 2024-06-18 ENCOUNTER — Other Ambulatory Visit: Payer: Self-pay | Admitting: *Deleted

## 2024-06-18 DIAGNOSIS — C188 Malignant neoplasm of overlapping sites of colon: Secondary | ICD-10-CM

## 2024-06-18 DIAGNOSIS — K7460 Unspecified cirrhosis of liver: Secondary | ICD-10-CM

## 2024-06-19 ENCOUNTER — Other Ambulatory Visit

## 2024-06-19 DIAGNOSIS — R7302 Impaired glucose tolerance (oral): Secondary | ICD-10-CM

## 2024-06-19 DIAGNOSIS — I10 Essential (primary) hypertension: Secondary | ICD-10-CM

## 2024-06-19 DIAGNOSIS — E7800 Pure hypercholesterolemia, unspecified: Secondary | ICD-10-CM

## 2024-06-19 DIAGNOSIS — M810 Age-related osteoporosis without current pathological fracture: Secondary | ICD-10-CM | POA: Diagnosis not present

## 2024-06-19 DIAGNOSIS — N1832 Chronic kidney disease, stage 3b: Secondary | ICD-10-CM | POA: Diagnosis not present

## 2024-06-19 DIAGNOSIS — E042 Nontoxic multinodular goiter: Secondary | ICD-10-CM

## 2024-06-19 DIAGNOSIS — Z0000 Encounter for general adult medical examination without abnormal findings: Secondary | ICD-10-CM

## 2024-06-19 DIAGNOSIS — E782 Mixed hyperlipidemia: Secondary | ICD-10-CM

## 2024-06-20 ENCOUNTER — Ambulatory Visit: Admitting: Internal Medicine

## 2024-06-20 ENCOUNTER — Ambulatory Visit: Payer: Self-pay

## 2024-06-20 ENCOUNTER — Ambulatory Visit: Payer: Self-pay | Admitting: Internal Medicine

## 2024-06-20 LAB — COMPLETE METABOLIC PANEL WITHOUT GFR
AG Ratio: 0.9 (calc) — ABNORMAL LOW (ref 1.0–2.5)
ALT: 61 U/L — ABNORMAL HIGH (ref 6–29)
AST: 151 U/L — ABNORMAL HIGH (ref 10–35)
Albumin: 3.4 g/dL — ABNORMAL LOW (ref 3.6–5.1)
Alkaline phosphatase (APISO): 282 U/L — ABNORMAL HIGH (ref 37–153)
BUN/Creatinine Ratio: 9 (calc) (ref 6–22)
BUN: 11 mg/dL (ref 7–25)
CO2: 24 mmol/L (ref 20–32)
Calcium: 9.4 mg/dL (ref 8.6–10.4)
Chloride: 106 mmol/L (ref 98–110)
Creat: 1.18 mg/dL — ABNORMAL HIGH (ref 0.60–1.00)
Globulin: 3.9 g/dL — ABNORMAL HIGH (ref 1.9–3.7)
Glucose, Bld: 138 mg/dL — ABNORMAL HIGH (ref 65–99)
Potassium: 3.3 mmol/L — ABNORMAL LOW (ref 3.5–5.3)
Sodium: 141 mmol/L (ref 135–146)
Total Bilirubin: 1.8 mg/dL — ABNORMAL HIGH (ref 0.2–1.2)
Total Protein: 7.3 g/dL (ref 6.1–8.1)

## 2024-06-20 LAB — LIPID PANEL
Cholesterol: 121 mg/dL (ref <200)
HDL: 28 mg/dL — ABNORMAL LOW (ref >=50)
LDL Cholesterol (Calc): 74 mg/dL
Non-HDL Cholesterol (Calc): 93 mg/dL (ref <130)
Total CHOL/HDL Ratio: 4.3 (calc) (ref <5.0)
Triglycerides: 108 mg/dL (ref <150)

## 2024-06-20 LAB — CBC WITH DIFFERENTIAL/PLATELET
Absolute Lymphocytes: 1426 {cells}/uL (ref 850–3900)
Absolute Monocytes: 701 {cells}/uL (ref 200–950)
Basophils Absolute: 87 {cells}/uL (ref 0–200)
Basophils Relative: 1.4 %
Eosinophils Absolute: 260 {cells}/uL (ref 15–500)
Eosinophils Relative: 4.2 %
HCT: 39.1 % (ref 35.0–45.0)
Hemoglobin: 12.8 g/dL (ref 11.7–15.5)
MCH: 30.7 pg (ref 27.0–33.0)
MCHC: 32.7 g/dL (ref 32.0–36.0)
MCV: 93.8 fL (ref 80.0–100.0)
MPV: 12.3 fL (ref 7.5–12.5)
Monocytes Relative: 11.3 %
Neutro Abs: 3726 {cells}/uL (ref 1500–7800)
Neutrophils Relative %: 60.1 %
Platelets: 123 Thousand/uL — ABNORMAL LOW (ref 140–400)
RBC: 4.17 Million/uL (ref 3.80–5.10)
RDW: 15.9 % — ABNORMAL HIGH (ref 11.0–15.0)
Total Lymphocyte: 23 %
WBC: 6.2 Thousand/uL (ref 3.8–10.8)

## 2024-06-20 LAB — HEMOGLOBIN A1C
Hgb A1c MFr Bld: 5.6 % (ref <5.7)
Mean Plasma Glucose: 114 mg/dL
eAG (mmol/L): 6.3 mmol/L

## 2024-06-20 LAB — TSH: TSH: 1.21 m[IU]/L (ref 0.40–4.50)

## 2024-06-20 NOTE — Telephone Encounter (Signed)
 FYI Only or Action Required?: FYI only for provider.  Patient was last seen in primary care on 11/15/2023 by Perri Ronal PARAS, MD.  Called Nurse Triage reporting Vomiting.  Symptoms began yesterday.  Interventions attempted: OTC medications: immodium.  Symptoms are: gradually improving.  Triage Disposition: Home Care  Patient/caregiver understands and will follow disposition?: Yes    Copied from CRM #[id number]. Topic: Clinical - Red Word Triage >> Jun 20, 2024 12:21 PM Ivette P wrote: Red Word that prompted transfer to Nurse Triage: throwing up, diarrhea and nausea, threw up an hour half ago. Reason for Disposition  MILD-MODERATE diarrhea (e.g., 1-6 times / day more than normal)  Answer Assessment - Initial Assessment Questions 1. DIARRHEA SEVERITY: How bad is the diarrhea? How many more stools have you had in the past 24 hours than normal?      4  2. ONSET: When did the diarrhea begin?      yesterday 3. STOOL DESCRIPTION:  How loose or watery is the diarrhea? What is the stool color? Is there any blood or mucous in the stool?     combination 4. VOMITING: Are you also vomiting? If Yes, ask: How many times in the past 24 hours?      3 5. ABDOMEN PAIN: Are you having any abdomen pain? If Yes, ask: What does it feel like? (e.g., crampy, dull, intermittent, constant)      Not anymore  6. ABDOMEN PAIN SEVERITY: If present, ask: How bad is the pain?  (e.g., Scale 1-10; mild, moderate, or severe)     Was just cramping when had to go to bathroom  7. ORAL INTAKE: If vomiting, Have you been able to drink liquids? How much liquids have you had in the past 24 hours?     Able to keep fluids down now 8. HYDRATION: Any signs of dehydration? (e.g., dry mouth [not just dry lips], too weak to stand, dizziness, new weight loss) When did you last urinate?     no 9. EXPOSURE: Have you traveled to a foreign country recently? Have you been exposed to anyone with  diarrhea? Could you have eaten any food that was spoiled?     Yes food could have been bad 10. ANTIBIOTIC USE: Are you taking antibiotics now or have you taken antibiotics in the past 2 months?       no 11. OTHER SYMPTOMS: Do you have any other symptoms? (e.g., fever, blood in stool)       no  Protocols used: Diarrhea-A-AH

## 2024-06-20 NOTE — Telephone Encounter (Signed)
 Pt called in to cancel her annual visit today due to eating some spoiled food last night and having diarrhea and vomiting.

## 2024-06-27 ENCOUNTER — Inpatient Hospital Stay

## 2024-06-27 ENCOUNTER — Ambulatory Visit (HOSPITAL_BASED_OUTPATIENT_CLINIC_OR_DEPARTMENT_OTHER)

## 2024-07-08 ENCOUNTER — Ambulatory Visit: Payer: Self-pay | Admitting: Oncology

## 2024-07-08 ENCOUNTER — Inpatient Hospital Stay: Attending: Oncology

## 2024-07-08 ENCOUNTER — Ambulatory Visit (HOSPITAL_BASED_OUTPATIENT_CLINIC_OR_DEPARTMENT_OTHER)
Admission: RE | Admit: 2024-07-08 | Discharge: 2024-07-08 | Disposition: A | Discharge status: Home / Self Care | Source: Ambulatory Visit | Attending: Oncology | Admitting: Oncology

## 2024-07-08 ENCOUNTER — Other Ambulatory Visit: Payer: Self-pay | Admitting: Oncology

## 2024-07-08 DIAGNOSIS — K7460 Unspecified cirrhosis of liver: Secondary | ICD-10-CM | POA: Insufficient documentation

## 2024-07-08 DIAGNOSIS — K766 Portal hypertension: Secondary | ICD-10-CM | POA: Insufficient documentation

## 2024-07-08 DIAGNOSIS — J8410 Pulmonary fibrosis, unspecified: Secondary | ICD-10-CM | POA: Insufficient documentation

## 2024-07-08 DIAGNOSIS — Z90710 Acquired absence of both cervix and uterus: Secondary | ICD-10-CM | POA: Insufficient documentation

## 2024-07-08 DIAGNOSIS — C188 Malignant neoplasm of overlapping sites of colon: Secondary | ICD-10-CM

## 2024-07-08 DIAGNOSIS — Z79899 Other long term (current) drug therapy: Secondary | ICD-10-CM | POA: Diagnosis not present

## 2024-07-08 DIAGNOSIS — N200 Calculus of kidney: Secondary | ICD-10-CM | POA: Insufficient documentation

## 2024-07-08 DIAGNOSIS — K861 Other chronic pancreatitis: Secondary | ICD-10-CM | POA: Insufficient documentation

## 2024-07-08 DIAGNOSIS — I700 Atherosclerosis of aorta: Secondary | ICD-10-CM | POA: Insufficient documentation

## 2024-07-08 DIAGNOSIS — I2510 Atherosclerotic heart disease of native coronary artery without angina pectoris: Secondary | ICD-10-CM | POA: Diagnosis not present

## 2024-07-08 DIAGNOSIS — Z85048 Personal history of other malignant neoplasm of rectum, rectosigmoid junction, and anus: Secondary | ICD-10-CM | POA: Diagnosis not present

## 2024-07-08 DIAGNOSIS — K8689 Other specified diseases of pancreas: Secondary | ICD-10-CM | POA: Diagnosis not present

## 2024-07-08 DIAGNOSIS — K7469 Other cirrhosis of liver: Secondary | ICD-10-CM | POA: Diagnosis not present

## 2024-07-08 DIAGNOSIS — C20 Malignant neoplasm of rectum: Secondary | ICD-10-CM | POA: Diagnosis not present

## 2024-07-08 LAB — BASIC METABOLIC PANEL - CANCER CENTER ONLY
Anion gap: 16 — ABNORMAL HIGH (ref 5–15)
BUN: 11 mg/dL (ref 8–23)
CO2: 21 mmol/L — ABNORMAL LOW (ref 22–32)
Calcium: 9.8 mg/dL (ref 8.9–10.3)
Chloride: 102 mmol/L (ref 98–111)
Creatinine: 1.21 mg/dL — ABNORMAL HIGH (ref 0.44–1.00)
GFR, Estimated: 47 mL/min — ABNORMAL LOW (ref >60)
Glucose, Bld: 113 mg/dL — ABNORMAL HIGH (ref 70–99)
Potassium: 3.1 mmol/L — ABNORMAL LOW (ref 3.5–5.1)
Sodium: 140 mmol/L (ref 135–145)

## 2024-07-08 MED ORDER — IOHEXOL 300 MG/ML  SOLN
100.0000 mL | Freq: Once | INTRAMUSCULAR | Status: AC | PRN
  Administered 2024-07-08 (×2): 85 mL via INTRAVENOUS

## 2024-07-11 ENCOUNTER — Other Ambulatory Visit: Payer: Self-pay

## 2024-07-11 ENCOUNTER — Telehealth: Payer: Self-pay

## 2024-07-11 DIAGNOSIS — I10 Essential (primary) hypertension: Secondary | ICD-10-CM

## 2024-07-11 MED ORDER — METOPROLOL SUCCINATE ER 25 MG PO TB24: 25.0000 mg | ORAL_TABLET | Freq: Every day | ORAL | 0 refills | Status: DC

## 2024-07-11 NOTE — Telephone Encounter (Signed)
 We called patient to come in today to be seen for her low sodium per Dr. Vivia request patient stated she had family from out of town in her home and could not come in today to be seen. She requested to be seen on Monday 07/14/24, appointment was booked.

## 2024-07-11 NOTE — Telephone Encounter (Signed)
 Patient gave verbal understanding and had no further questions or concerns

## 2024-07-11 NOTE — Telephone Encounter (Signed)
-----   Message from Arley Hof sent at 07/08/2024  4:30 PM EDT ----- Please call patient, the potassium is low, likely secondary to furosemide  therapy, copy chemistry panel to primary MD for management of the hypokalemia, she should have a follow-up visit next few  weeks  ----- Message ----- From: Rebecka, Lab In [place] Sent: 07/08/2024   2:30 PM EDT To: Arley KATHEE Hof, MD

## 2024-07-14 ENCOUNTER — Encounter: Payer: Self-pay | Admitting: Internal Medicine

## 2024-07-14 ENCOUNTER — Ambulatory Visit: Admitting: Internal Medicine

## 2024-07-14 VITALS — BP 120/70 | HR 97 | Temp 98.3°F | Ht 64.0 in | Wt 127.0 lb

## 2024-07-14 DIAGNOSIS — E876 Hypokalemia: Secondary | ICD-10-CM

## 2024-07-14 DIAGNOSIS — T502X5A Adverse effect of carbonic-anhydrase inhibitors, benzothiadiazides and other diuretics, initial encounter: Secondary | ICD-10-CM

## 2024-07-14 DIAGNOSIS — M5412 Radiculopathy, cervical region: Secondary | ICD-10-CM | POA: Diagnosis not present

## 2024-07-14 MED ORDER — HYDROCODONE-ACETAMINOPHEN 5-325 MG PO TABS: 1.0000 | ORAL_TABLET | Freq: Four times a day (QID) | ORAL | 0 refills | Status: DC | PRN

## 2024-07-14 MED ORDER — POTASSIUM CHLORIDE CRYS ER 20 MEQ PO TBCR: 20.0000 meq | EXTENDED_RELEASE_TABLET | Freq: Every day | ORAL | 3 refills | Status: AC

## 2024-07-14 MED ORDER — METHYLPREDNISOLONE 4 MG PO TABS
ORAL_TABLET | ORAL | 0 refills | Status: DC
Start: 2024-07-14 — End: 2024-08-25

## 2024-07-14 NOTE — Progress Notes (Addendum)
 Patient Care Team: Jeanne Jeanne PARAS, MD as PCP - General [place], Peter M, MD as PCP - Cardiology (Cardiology)  Visit Date: 07/14/24  Subjective:   Chief Complaint  Patient presents with   Hypokalemia   Neck Pain    Going into right arm.    Patient PI:Jeanne Smith,Female DOB:September 13, 1951,73 y.o. FMW:[id number]   73 y.o.Female presents today for acute sick visit with Hypokalemia; Neck Pain radiating into right arm. Patient has a past medical history of Arthritis; HTN. Upon review of her 07/08/2024 Potassium of 3.1, it was determined she should come in. Says that two weeks ago, she's had neck pain radiating to her right shoulder, down right arm w/ numbness/tingling. This pain affects her range of motion and now she's noticing popping sounds with specific movements.  Past Medical History:  Diagnosis Date   Anxiety    Arthritis    oa   Attention deficit disorder    Cancer [facility])    Dysrhythmia 2019   episode of palpitations x 25 January 2018, stress test normal   H/O seasonal allergies    Hyperlipidemia    Hypertension    Hypothyroidism    thyroid  nodules, off synthroid > 15 years   Insomnia    Osteoarthritis    Osteopenia    Palpitations    Pneumonia    Thyroid  nodule     No Known Allergies Immunization History  Administered Date(s) Administered   Influenza,inj,Quad PF,6+ Mos 10/10/2016, 11/03/2019   PFIZER(Purple Top)SARS-COV-2 Vaccination 01/02/2020, 01/27/2020, 11/01/2020   PNEUMOCOCCAL CONJUGATE-20 01/23/2023   Pneumococcal Conjugate-13 02/07/2018   Pneumococcal Polysaccharide-23 02/11/2019   Past Surgical History:  Procedure Laterality Date   ABDOMINAL HYSTERECTOMY     AUGMENTATION MAMMAPLASTY Bilateral    silicone over 30 years   COLONOSCOPY     COLONOSCOPY WITH PROPOFOL  N/A 05/25/2022   Procedure: COLONOSCOPY WITH PROPOFOL ;  Surgeon: Charlanne Groom, MD;  Location: [facility];  Service: Gastroenterology;  Laterality: N/A;   CONVERSION TO TOTAL HIP Left  05/16/2018   Procedure: LEFT HIP ANTERIOR APPROACH;  Surgeon: Ernie Cough, MD;  Location: [facility];  Service: Orthopedics;  Laterality: Left;   FEMUR IM NAIL Right 06/16/2015   Procedure: INTRAMEDULLARY (IM) NAIL FEMORAL;  Surgeon: Evalene JONETTA Chancy, MD;  Location: [facility];  Service: Orthopedics;  Laterality: Right;   HARDWARE REMOVAL Right 09/24/2020   Procedure: REMOVAL OF IM ROD/HIP SCREW RIGHT HIP;  Surgeon: Vernetta Lonni GRADE, MD;  Location: [facility];  Service: Orthopedics;  Laterality: Right;   HARDWARE REMOVAL Right 12/24/2020   Procedure: HARDWARE REMOVAL RIGHT WRIST;  Surgeon: Jerri Kay HERO, MD;  Location: [facility];  Service: Orthopedics;  Laterality: Right;   HIP SURGERY     JOINT REPLACEMENT     ORIF RADIAL FRACTURE Right 12/24/2020   Procedure: OPEN REDUCTION INTERNAL FIXATION (ORIF) RIGHT RADIAL SHAFT FRACTURE;  Surgeon: Jerri Kay HERO, MD;  Location: [facility];  Service: Orthopedics;  Laterality: Right;   POLYPECTOMY  05/25/2022   Procedure: POLYPECTOMY;  Surgeon: Charlanne Groom, MD;  Location: [facility];  Service: Gastroenterology;;   TONSILLECTOMY     TOTAL HIP ARTHROPLASTY Right 09/24/2020   Procedure: RIGHT TOTAL HIP ARTHROPLASTY ANTERIOR APPROACH;  Surgeon: Vernetta Lonni GRADE, MD;  Location: [facility];  Service: Orthopedics;  Laterality: Right;   TUBAL LIGATION     XI ROBOTIC ASSISTED LOWER ANTERIOR RESECTION N/A 07/05/2022   Procedure: XI ROBOTIC ASSISTED LOWER ANTERIOR RESECTION;  Surgeon: Debby Hila, MD;  Location: [facility];  Service: General;  Laterality: N/A;    Family History  Problem Relation Age of Onset   Heart failure Mother    Hypertension Father    Cancer Father        lung   Stroke Father    Heart disease Brother    Alcoholism Brother    Colon cancer Neg Hx    Pancreatic cancer Neg Hx    Esophageal cancer Neg Hx    Liver cancer Neg Hx    Stomach cancer Neg Hx    Social History   Social History Narrative   Not  on file   Review of Systems  Constitutional:  Negative for fever and malaise/fatigue.  HENT:  Negative for congestion.   Eyes:  Negative for blurred vision.  Respiratory:  Negative for cough and shortness of breath.   Cardiovascular:  Negative for chest pain, palpitations and leg swelling.  Gastrointestinal:  Negative for vomiting.  Musculoskeletal:  Positive for neck pain. Negative for back pain.       (+) Crepitus  Skin:  Negative for rash.  Neurological:  Positive for tingling. Negative for loss of consciousness and headaches.     Objective:  Vitals: BP 120/70   Pulse 97   Temp 98.3 F (36.8 C)   Ht 5' 4 (1.626 m)   Wt 127 lb (57.6 kg)   SpO2 98%   BMI 21.80 kg/m   Physical Exam Vitals and nursing note reviewed.  Constitutional:      General: She is not in acute distress.    Appearance: Normal appearance. She is not toxic-appearing.  HENT:     Head: Normocephalic and atraumatic.  Pulmonary:     Effort: Pulmonary effort is normal.  Musculoskeletal:     Right shoulder: Decreased range of motion.     Cervical back: Spasms (left-side) present.  Skin:    General: Skin is warm and dry.  Neurological:     Mental Status: She is alert and oriented to person, place, and time. Mental status is at baseline.     Motor: Motor function is intact. No weakness.  Psychiatric:        Mood and Affect: Mood normal.        Behavior: Behavior normal.        Thought Content: Thought content normal.        Judgment: Judgment normal.     Results:  Studies Obtained And Personally Reviewed By Me: Labs:  CBC w/ Differential Lab Results  Component Value Date   WBC 6.2 06/19/2024   RBC 4.17 06/19/2024   HGB 12.8 06/19/2024   HCT 39.1 06/19/2024   PLT 123 (L) 06/19/2024   MCV 93.8 06/19/2024   MCH 30.7 06/19/2024   MCHC 32.7 06/19/2024   RDW 15.9 (H) 06/19/2024   MPV 12.3 06/19/2024   LYMPHSABS 1.3 11/08/2023   MONOABS 0.6 11/08/2023   BASOSABS 87 06/19/2024   Comprehensive  Metabolic Panel Lab Results  Component Value Date   NA 140 07/08/2024   K 3.1 (L) 07/08/2024   CL 102 07/08/2024   CO2 21 (L) 07/08/2024   GLUCOSE 113 (H) 07/08/2024   BUN 11 07/08/2024   CREATININE 1.21 (H) 07/08/2024   CALCIUM  9.8 07/08/2024   PROT 7.3 06/19/2024   ALBUMIN 3.4 (L) 11/08/2023   AST 151 (H) 06/19/2024   ALT 61 (H) 06/19/2024   ALKPHOS 262 (H) 11/08/2023   BILITOT 1.8 (H) 06/19/2024   GFR 36.87 (L) 11/08/2023   EGFR 32 (L)  04/16/2023   GFRNONAA 47 (L) 07/08/2024   Lipid Panel  Lab Results  Component Value Date   CHOL 121 06/19/2024   HDL 28 (L) 06/19/2024   LDLCALC 74 06/19/2024   TRIG 108 06/19/2024   A1c Lab Results  Component Value Date   HGBA1C 5.6 06/19/2024    TSH Lab Results  Component Value Date   TSH 1.21 06/19/2024   Assessment & Plan:   Meds ordered this encounter  Medications   methylPREDNISolone  (MEDROL ) 4 MG tablet    Sig: Take in tapering course as directed 6-5-4-3-2-1    Dispense:  21 tablet    Refill:  0   potassium chloride  SA (KLOR-CON  M) 20 MEQ tablet    Sig: Take 1 tablet (20 mEq total) by mouth daily.    Dispense:  30 tablet    Refill:  3   HYDROcodone -acetaminophen  (NORCO/VICODIN) 5-325 MG tablet    Sig: Take 1 tablet by mouth every 6 (six) hours as needed for moderate pain (pain score 4-6).    Dispense:  15 tablet    Refill:  0   Hypokalemia: 07/08/2024 Potassium of 3.1. Secondary to diuretic use.  Sending in Klor-Con  20 meq to take daily.   Neck Pain for the past two weeks ago, she's had neck pain radiating to her right shoulder, down right arm w/ numbness/tingling. This pain affects her range of motion and now she's noticing popping sounds with specific movements. Sending in Medrol  4 mg tapering course 6-5-4-3-2-1 and Norco 5-325 mg #15 tablets to take as needed. PDMP reviewed.  Return in 2 weeks for potassium recheck. Lab only. If not improving will need MRI of C-spine   I,Emily Lagle,acting as a scribe for Jeanne JINNY Hailstone, MD.,have documented all relevant documentation on the behalf of Jeanne JINNY Hailstone, MD,as directed by  Jeanne JINNY Hailstone, MD while in the presence of Jeanne JINNY Hailstone, MD.  I, Jeanne JINNY Hailstone, MD, have reviewed all documentation for this visit. The documentation on 07/14/2024 for the exam, diagnosis, procedures, and orders are all accurate and complete.

## 2024-07-16 ENCOUNTER — Ambulatory Visit: Payer: Self-pay | Admitting: Oncology

## 2024-07-17 ENCOUNTER — Telehealth: Payer: Self-pay | Admitting: *Deleted

## 2024-07-17 DIAGNOSIS — C188 Malignant neoplasm of overlapping sites of colon: Secondary | ICD-10-CM

## 2024-07-17 NOTE — Telephone Encounter (Addendum)
 Informed Jeanne Smith that per Dr. 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 Should see Dr. Charlanne. She reports seeing Dr. Perri on 8/18 and she went over scan with her as well. Also noted she has a kidney stone. Started on K+ since that was low and has sent urgent referral to Dr. Rolan Molt ([facility]) for painful neck that radiates down her arm w/tingling in arm as well. Started her on Prednisone  for the pain. Informed her that CT will be routed to Dr. Charlanne to f/u. Dr. Cloretta also wants her to return in 4-6 weeks with [facility]. Scheduling message sent.

## 2024-07-17 NOTE — Addendum Note (Signed)
 Addended by: STEVA DEVERE CROME on: 07/17/2024 03:45 PM   Modules accepted: Orders

## 2024-07-18 ENCOUNTER — Telehealth: Payer: Self-pay

## 2024-07-18 ENCOUNTER — Other Ambulatory Visit: Payer: Self-pay

## 2024-07-18 DIAGNOSIS — Z85038 Personal history of other malignant neoplasm of large intestine: Secondary | ICD-10-CM

## 2024-07-18 DIAGNOSIS — R935 Abnormal findings on diagnostic imaging of other abdominal regions, including retroperitoneum: Secondary | ICD-10-CM

## 2024-07-18 DIAGNOSIS — K7460 Unspecified cirrhosis of liver: Secondary | ICD-10-CM

## 2024-07-18 DIAGNOSIS — R978 Other abnormal tumor markers: Secondary | ICD-10-CM

## 2024-07-18 DIAGNOSIS — K859 Acute pancreatitis without necrosis or infection, unspecified: Secondary | ICD-10-CM

## 2024-07-18 NOTE — Telephone Encounter (Signed)
 Pt was made aware of Dr. Charlanne recommendations: MRCP was ordered to be done at [facility]. Pt was notified that there office will contact pt.  Orders for labs placed in Epic. Pt made aware to have labs done 2 days prior to MRCP. Location to lab provided Pt to be scheduled to see Dr. Charlanne on 08/11/2024 at 2:30 PM. Pt made aware. ( 7 day hold)  Pt verbalized understanding with all questions answered.

## 2024-07-18 NOTE — Telephone Encounter (Signed)
 Message Received: Jeanne Charlanne Groom, MD  Wonda Standing, RN Erskin Standing, Proceed with MRCP with contrast (RE: abn CT) Check CBC, CMP, lipase, CA 19-9 at time of MRCP FU in APP clinic or mine therafter RG

## 2024-07-27 ENCOUNTER — Encounter: Payer: Self-pay | Admitting: Internal Medicine

## 2024-07-27 NOTE — Patient Instructions (Addendum)
 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 Patient not eager to have MRI at this time. Potassium is 3.1. Have prescribed Medrol  tab in tapering course and potassium supplement. Follow up in 2 weeks with repeat potassium and let us  know if radiculopathy does not improve.

## 2024-07-29 ENCOUNTER — Other Ambulatory Visit

## 2024-07-29 DIAGNOSIS — I10 Essential (primary) hypertension: Secondary | ICD-10-CM

## 2024-07-30 ENCOUNTER — Ambulatory Visit: Payer: Self-pay | Admitting: Internal Medicine

## 2024-07-30 LAB — BASIC METABOLIC PANEL WITH GFR
BUN: 11 mg/dL (ref 7–25)
CO2: 22 mmol/L (ref 20–32)
Calcium: 9.3 mg/dL (ref 8.6–10.4)
Chloride: 106 mmol/L (ref 98–110)
Creat: 0.98 mg/dL (ref 0.60–1.00)
Glucose, Bld: 139 mg/dL (ref 65–139)
Potassium: 3.9 mmol/L (ref 3.5–5.3)
Sodium: 139 mmol/L (ref 135–146)
eGFR: 61 mL/min/1.73m2 (ref >=60)

## 2024-08-10 ENCOUNTER — Other Ambulatory Visit

## 2024-08-11 ENCOUNTER — Ambulatory Visit: Admitting: Gastroenterology

## 2024-08-14 ENCOUNTER — Telehealth: Payer: Self-pay | Admitting: *Deleted

## 2024-08-14 ENCOUNTER — Telehealth: Payer: Self-pay | Admitting: Oncology

## 2024-08-14 NOTE — Telephone Encounter (Signed)
 Jeanne Smith called to request to cancel her 9/19 visit until after her MRI on 9/28. Agreed that that is a good idea. 9/19 canceled and she will be called to reschedule. Scheduling message sent.

## 2024-08-15 ENCOUNTER — Inpatient Hospital Stay: Admitting: Oncology

## 2024-08-15 ENCOUNTER — Inpatient Hospital Stay: Attending: Oncology

## 2024-08-18 ENCOUNTER — Telehealth: Payer: Self-pay | Admitting: Oncology

## 2024-08-18 NOTE — Telephone Encounter (Signed)
 Lvm on PT phone with Scan Review Appt details.

## 2024-08-24 ENCOUNTER — Other Ambulatory Visit

## 2024-08-25 ENCOUNTER — Ambulatory Visit: Admitting: Internal Medicine

## 2024-08-25 ENCOUNTER — Telehealth: Payer: Self-pay | Admitting: Oncology

## 2024-08-25 ENCOUNTER — Encounter: Payer: Self-pay | Admitting: Internal Medicine

## 2024-08-25 VITALS — BP 110/70 | HR 96 | Ht 63.75 in | Wt 122.0 lb

## 2024-08-25 DIAGNOSIS — F439 Reaction to severe stress, unspecified: Secondary | ICD-10-CM

## 2024-08-25 DIAGNOSIS — E782 Mixed hyperlipidemia: Secondary | ICD-10-CM

## 2024-08-25 DIAGNOSIS — E876 Hypokalemia: Secondary | ICD-10-CM | POA: Diagnosis not present

## 2024-08-25 DIAGNOSIS — M810 Age-related osteoporosis without current pathological fracture: Secondary | ICD-10-CM

## 2024-08-25 DIAGNOSIS — Z96643 Presence of artificial hip joint, bilateral: Secondary | ICD-10-CM

## 2024-08-25 DIAGNOSIS — E559 Vitamin D deficiency, unspecified: Secondary | ICD-10-CM

## 2024-08-25 DIAGNOSIS — Z0000 Encounter for general adult medical examination without abnormal findings: Secondary | ICD-10-CM

## 2024-08-25 DIAGNOSIS — Z1211 Encounter for screening for malignant neoplasm of colon: Secondary | ICD-10-CM

## 2024-08-25 DIAGNOSIS — F411 Generalized anxiety disorder: Secondary | ICD-10-CM

## 2024-08-25 DIAGNOSIS — Z23 Encounter for immunization: Secondary | ICD-10-CM

## 2024-08-25 DIAGNOSIS — C189 Malignant neoplasm of colon, unspecified: Secondary | ICD-10-CM | POA: Diagnosis not present

## 2024-08-25 DIAGNOSIS — I10 Essential (primary) hypertension: Secondary | ICD-10-CM

## 2024-08-25 DIAGNOSIS — N1831 Chronic kidney disease, stage 3a: Secondary | ICD-10-CM

## 2024-08-25 NOTE — Progress Notes (Signed)
 Annual Wellness Visit   Patient Care Team: Jeanne Smith, Jeanne PARAS, MD as PCP - General Jeanne Smith, Jeanne M, MD as PCP - Cardiology (Cardiology)  Visit Date: 08/25/24   Chief Complaint  Patient presents with   Annual Exam   Subjective:  Patient: Jeanne Smith, Female DOB: Jun 21, 1951, 73 y.o. MRN: [id number] Vitals:   08/25/24 1402  BP: 110/70   Jeanne Smith is a 73 y.o. Female who presents today for her Annual Wellness Visit. Patient has Intertrochanteric fracture of right femur (HCC); Comminuted right humeral fracture; HTN (hypertension); Pure hypercholesterolemia; S/P left THA, AA; S/P hip replacement; Pain in right wrist; Pain in right hand; Closed fracture of right wrist; Stenosing tenosynovitis of wrist; Retained orthopedic hardware; Status post total replacement of right hip; Knee pain; Closed Galeazzi's fracture of left radius; Colonic mass; Adenomatous polyp of transverse colon; and Colon cancer (HCC) on their problem list.   She was bitten by a cat on right forearm last week.  No sign of infection. Needs Tdap update which she will need to get at local pharmacy  very soon. Declines Shingrix vaccine at this time  Flu vaccine given today.  She noticed her her skin has become very dry.   History of chronic constipation.    History of edema treated with Furosemide  20 mg daily. Reports this is helping symptoms without negative side effects. Says she takes it 4 times a week.    History of Hypertension treated with Metoprolol  Succinate 25 mg daily. Blood pressure today is normal at 110/70. History of paroxysmal atrial fibrillation.    History of Hypokalemia treated with Klor-Con  meq twice daily as needed. 06/19/2024 Potassium 3.3.   History of Vitamin D  deficiency treated with Drisdol  50,000 units weekly.  She likely has osteoporosis but has not wanted to get bone density study. Do not see one on file  in [facility] recently but has been discussed and was ordered in 2022.She saw Jeanne Smith  recently for GYN exam.   History of osteoarthritis with 03/27/18 DEXA showing osteopenia.   History of eczema on lower legs.    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 CEA is normal on 07/31/23. Plans to schedule a surveillance colonoscopy. Followed by her Oncologist, Jeanne Smith and will see him in October.   Labs 07/29/2024 BMET WNL  03/28/2019 Mammogram There is extensive extracapsular silicone rupture bilaterally. No mammographic evidence of Malignancy in bilateral breast.    05/13/2024 Colonoscopy Three 4 to 6 mm polyps in the proximal ascending colon, in the mid ascending colon and in the cecum. Resected and retrieved. Discovered to be tubular adenoma and sessile serrated lesion without dysplasia. One 10 mm polyp in the proximal transverse colon, Resected and retrieved. Discovered to be tubular adenoma.  One 10 mm polyp in the mid descending colon. Resected and retrieved. Found to be tubular adenoma One 4 mm polyp in the proximal sigmoid colon, Resected and retrieved. Found to be tubular adenoma. Patent end- to- side colo- colonic anastomosis, characterized by healthy appearing mucosa. No local recurrence. Non- bleeding internal hemorrhoids. The examination was otherwise normal on direct and retroflexion views.    Last Pap smear 04/28/2024 normal.   Vaccine counseling: Influenza vaccine received today. Tetanus and Shingles vaccine due.   Health Maintenance  Topic Date Due   DTaP/Tdap/Td (1 - Tdap) Never done   Zoster Vaccines- Shingrix (1 of 2) Never done   Medicare Annual Wellness (AWV)  06/19/2025   Pneumococcal Vaccine: 50+ Years  Completed   Influenza Vaccine  Completed   DEXA SCAN  Completed   HPV VACCINES  Aged Out   Meningococcal B Vaccine  Aged Out   Mammogram  Discontinued   COVID-19 Vaccine  Discontinued   Hepatitis C Screening  Discontinued   Fecal DNA (Cologuard)  Discontinued      Review of Systems  Constitutional:  Negative for fever and  malaise/fatigue.  HENT:  Negative for congestion.   Eyes:  Negative for blurred vision.  Respiratory:  Negative for cough and shortness of breath.   Cardiovascular:  Negative for chest pain, palpitations and leg swelling.  Gastrointestinal:  Negative for vomiting.  Musculoskeletal:  Negative for back pain.  Skin:  Negative for rash.  Neurological:  Negative for loss of consciousness and headaches.   Objective:  Vitals: body mass index is 21.11 kg/Smith. Today's Vitals   08/25/24 1402  BP: 110/70  Pulse: 96  SpO2: 98%  Weight: 122 lb (55.3 kg)  Height: 5' 3.75 (1.619 Smith)   Physical Exam Vitals and nursing note reviewed.  Constitutional:      General: She is not in acute distress.    Appearance: Normal appearance. She is not ill-appearing or toxic-appearing.  HENT:     Head: Normocephalic and atraumatic.     Right Ear: Hearing, tympanic membrane, ear canal and external ear normal.     Left Ear: Hearing, tympanic membrane, ear canal and external ear normal.     Mouth/Throat:     Pharynx: Oropharynx is clear.  Eyes:     Extraocular Movements: Extraocular movements intact.     Pupils: Pupils are equal, round, and reactive to light.  Neck:     Thyroid : No thyroid  mass, thyromegaly or thyroid  tenderness.     Vascular: No carotid bruit.  Cardiovascular:     Rate and Rhythm: Normal rate and regular rhythm. No extrasystoles are present.    Pulses:          Dorsalis pedis pulses are 2+ on the right side and 2+ on the left side.     Heart sounds: Normal heart sounds. No murmur heard.    No friction rub. No gallop.  Pulmonary:     Effort: Pulmonary effort is normal.     Breath sounds: Normal breath sounds. No decreased breath sounds, wheezing, rhonchi or rales.  Chest:     Chest wall: No mass.  Abdominal:     Palpations: Abdomen is soft. There is no hepatomegaly, splenomegaly or mass.     Tenderness: There is no abdominal tenderness.     Hernia: No hernia is present.   Genitourinary:    Comments: GYN exam deferred.  Musculoskeletal:     Cervical back: Normal range of motion.     Right lower leg: No edema.     Left lower leg: No edema.  Lymphadenopathy:     Cervical: No cervical adenopathy.     Upper Body:     Right upper body: No supraclavicular adenopathy.     Left upper body: No supraclavicular adenopathy.  Skin:    General: Skin is warm and dry.  Neurological:     General: No focal deficit present.     Mental Status: She is alert and oriented to person, place, and time. Mental status is at baseline.     Sensory: Sensation is intact.     Motor: Motor function is intact. No weakness.     Deep Tendon Reflexes: Reflexes are normal and symmetric.  Psychiatric:  Attention and Perception: Attention normal.        Mood and Affect: Mood normal.        Speech: Speech normal.        Behavior: Behavior normal.        Thought Content: Thought content normal.        Cognition and Memory: Cognition normal.        Judgment: Judgment normal.     Current Outpatient Medications  Medication Instructions   ALPRAZolam  (XANAX ) 0.5 mg, As needed   amLODipine  (NORVASC ) 5 mg, Oral, Daily   aspirin  81 mg, Oral, 2 times daily   b complex vitamins capsule 1 capsule, Daily   estradiol  (ESTRACE ) 2 mg, Daily at bedtime   furosemide  (LASIX ) 20 MG tablet TAKE 1 TABLET(20 MG) BY MOUTH DAILY   Hypromellose, PF, (RETAINE HPMC) 0.3 % SOLN 1 drop, 3 times daily PRN   Magnesium  250 mg, Daily at bedtime   metoprolol  succinate (TOPROL -XL) 25 mg, Oral, Daily   metroNIDAZOLE (METROGEL) 1 % gel 1 Application, As needed   Multiple Vitamin (MULTIVITAMIN) tablet 1 tablet, Daily   mupirocin  ointment (BACTROBAN ) 2 % 1 Application, Topical, 2 times daily   potassium chloride  SA (KLOR-CON  Smith) 20 MEQ tablet 20 mEq, Oral, Daily   PREVIDENT 5000 BOOSTER PLUS 1.1 % PSTE USE TO BRUSH TEETH AFTER MEALS AND BEFORE BEDTIME   Saline 0.9 % AERS 1 spray, As needed   Vitamin D   (Ergocalciferol ) (DRISDOL ) 50,000 Units, Oral, Every 7 days   Past Medical History:  Diagnosis Date   Anxiety    Arthritis    oa   Attention deficit disorder    Cancer [facility])    Dysrhythmia 2019   episode of palpitations x 25 January 2018, stress test normal   H/O seasonal allergies    Hyperlipidemia    Hypertension    Hypothyroidism    thyroid  nodules, off synthroid > 15 years   Insomnia    Osteoarthritis    Osteopenia    Palpitations    Pneumonia    Thyroid  nodule    Medical/Surgical History Narrative:  Allergic/Intolerant to: No Known Allergies  Past Surgical History:  Procedure Laterality Date   ABDOMINAL HYSTERECTOMY     AUGMENTATION MAMMAPLASTY Bilateral    silicone over 30 years   COLONOSCOPY     COLONOSCOPY WITH PROPOFOL  N/A 05/25/2022   Procedure: COLONOSCOPY WITH PROPOFOL ;  Surgeon: Charlanne Groom, MD;  Location: [facility];  Service: Gastroenterology;  Laterality: N/A;   CONVERSION TO TOTAL HIP Left 05/16/2018   Procedure: LEFT HIP ANTERIOR APPROACH;  Surgeon: Ernie Cough, MD;  Location: [facility];  Service: Orthopedics;  Laterality: Left;   FEMUR IM NAIL Right 06/16/2015   Procedure: INTRAMEDULLARY (IM) NAIL FEMORAL;  Surgeon: Evalene JONETTA Chancy, MD;  Location: [facility];  Service: Orthopedics;  Laterality: Right;   HARDWARE REMOVAL Right 09/24/2020   Procedure: REMOVAL OF IM ROD/HIP SCREW RIGHT HIP;  Surgeon: Vernetta Lonni GRADE, MD;  Location: [facility];  Service: Orthopedics;  Laterality: Right;   HARDWARE REMOVAL Right 12/24/2020   Procedure: HARDWARE REMOVAL RIGHT WRIST;  Surgeon: Jerri Kay HERO, MD;  Location: [facility];  Service: Orthopedics;  Laterality: Right;   HIP SURGERY     JOINT REPLACEMENT     ORIF RADIAL FRACTURE Right 12/24/2020   Procedure: OPEN REDUCTION INTERNAL FIXATION (ORIF) RIGHT RADIAL SHAFT FRACTURE;  Surgeon: Jerri Kay HERO, MD;  Location: [facility];  Service: Orthopedics;  Laterality: Right;  POLYPECTOMY   05/25/2022   Procedure: POLYPECTOMY;  Surgeon: Charlanne Groom, MD;  Location: [facility];  Service: Gastroenterology;;   TONSILLECTOMY     TOTAL HIP ARTHROPLASTY Right 09/24/2020   Procedure: RIGHT TOTAL HIP ARTHROPLASTY ANTERIOR APPROACH;  Surgeon: Vernetta Lonni GRADE, MD;  Location: [facility];  Service: Orthopedics;  Laterality: Right;   TUBAL LIGATION     XI ROBOTIC ASSISTED LOWER ANTERIOR RESECTION N/A 07/05/2022   Procedure: XI ROBOTIC ASSISTED LOWER ANTERIOR RESECTION;  Surgeon: Debby Hila, MD;  Location: [facility];  Service: General;  Laterality: N/A;   Family History  Problem Relation Age of Onset   Heart failure Mother    Hypertension Father    Cancer Father        lung   Stroke Father    Heart disease Brother    Alcoholism Brother    Colon cancer Neg Hx    Pancreatic cancer Neg Hx    Esophageal cancer Neg Hx    Liver cancer Neg Hx    Stomach cancer Neg Hx       Most Recent Health Risks Assessment:   Most Recent Social Determinants of Health (Including Hx of Tobacco, Alcohol , and Drug Use) SDOH Screenings   Food Insecurity: No Food Insecurity (08/22/2024)  Housing: Unknown (08/22/2024)  Transportation Needs: No Transportation Needs (08/22/2024)  Alcohol  Screen: Low Risk  (08/22/2024)  Depression (PHQ2-9): Low Risk  (07/14/2024)  Financial Resource Strain: Patient Declined (08/22/2024)  Physical Activity: Insufficiently Active (08/22/2024)  Social Connections: Socially Isolated (08/22/2024)  Stress: Stress Concern Present (08/22/2024)  Tobacco Use: Medium Risk (08/25/2024)   Social History   Tobacco Use   Smoking status: Former    Current packs/day: 0.00    Types: Cigarettes    Quit date: 11/27/2005    Years since quitting: 18.7   Smokeless tobacco: Never  Vaping Use   Vaping status: Never Used  Substance Use Topics   Alcohol  use: Yes    Alcohol /week: 14.0 standard drinks of alcohol     Types: 14 Glasses of wine per week    Comment: scotch or wine daily   Drug  use: No   Most Recent Fall Risk Assessment:    08/25/2024    2:03 PM  Fall Risk   Falls in the past year? 0  Number falls in past yr: 0  Injury with Fall? 0  Risk for fall due to : No Fall Risks  Follow up Education provided;Falls prevention discussed;Falls evaluation completed   Most Recent Anxiety/Depression Screenings:    07/14/2024   11:58 AM 06/13/2024   10:00 AM  PHQ 2/9 Scores  PHQ - 2 Score 0 0    Most Recent Cognitive Screening:    01/23/2023   11:13 AM  6CIT Screen  What Year? 0 points  What month? 0 points  What time? 0 points  Count back from 20 0 points  Months in reverse 0 points  Repeat phrase 0 points  Total Score 0 points    Results:     03/28/2019 Mammogram There is extensive extracapsular silicone rupture bilaterally. No mammographic evidence of Malignancy in bilateral breast.    05/13/2024 Colonoscopy Three 4 to 6 mm polyps in the proximal ascending colon, in the mid ascending colon and in the cecum. Resected and retrieved. Discovered to be tubular adenoma and sessile serrated lesion without dysplasia. One 10 mm polyp in the proximal transverse colon, Resected and retrieved. Discovered to be tubular adenoma.  One 10 mm polyp in the mid descending  colon. Resected and retrieved. Found to be tubular adenoma One 4 mm polyp in the proximal sigmoid colon, Resected and retrieved. Found to be tubular adenoma. Patent end- to- side colo- colonic anastomosis, characterized by healthy appearing mucosa. No local recurrence. Non- bleeding internal hemorrhoids. The examination was otherwise normal on direct and retroflexion views.  Labs:  CBC w/ Differential Lab Results  Component Value Date   WBC 6.2 06/19/2024   RBC 4.17 06/19/2024   HGB 12.8 06/19/2024   HCT 39.1 06/19/2024   PLT 123 (L) 06/19/2024   MCV 93.8 06/19/2024   MCH 30.7 06/19/2024   MCHC 32.7 06/19/2024   RDW 15.9 (H) 06/19/2024   MPV 12.3 06/19/2024   LYMPHSABS 1.3 11/08/2023   MONOABS 0.6  11/08/2023   BASOSABS 87 06/19/2024    Comprehensive Metabolic Panel Lab Results  Component Value Date   NA 139 07/29/2024   K 3.9 07/29/2024   CL 106 07/29/2024   CO2 22 07/29/2024   GLUCOSE 139 07/29/2024   BUN 11 07/29/2024   CREATININE 0.98 07/29/2024   CALCIUM  9.3 07/29/2024   PROT 7.3 06/19/2024   ALBUMIN 3.4 (L) 11/08/2023   AST 151 (H) 06/19/2024   ALT 61 (H) 06/19/2024   ALKPHOS 262 (H) 11/08/2023   BILITOT 1.8 (H) 06/19/2024   GFR 36.87 (L) 11/08/2023   EGFR 61 07/29/2024   GFRNONAA 47 (L) 07/08/2024   Lipid Panel  Lab Results  Component Value Date   CHOL 121 06/19/2024   HDL 28 (L) 06/19/2024   LDLCALC 74 06/19/2024   TRIG 108 06/19/2024   A1c Lab Results  Component Value Date   HGBA1C 5.6 06/19/2024    TSH Lab Results  Component Value Date   TSH 1.21 06/19/2024    Assessment & Plan:   Orders Placed This Encounter  Procedures   Flu vaccine HIGH DOSE PF(Fluzone Trivalent)   Cologuard   History of chronic constipation.    Edema: treated with Furosemide  20 mg daily. Reports this is helping symptoms without negative side effects. Says she takes it 4 times a week.    Hypertension: treated with Metoprolol  Succinate 25 mg daily. Blood pressure today is normal at 110/70. History of paroxysmal atrial fibrillation.  Hypokalemia: treated with Klor-Con  meq twice daily as needed. 06/19/2024 Potassium 3.3. Needs to take potassium supplement  Postmenopausal and likely has osteoporosis. Bone density study ordered in 2022. She did not go for it. Last one on file 2019 showing osteopenia.  Declines today.  Vitamin D  deficiency: treated with Drisdol  50,000 units weekly.   Anxiety: treated with Alprazolam  0.5 mg as needed.   History of osteoarthritis   CKD- not seen recently at [facility] Creatinine is 1.21. eGFR is 47cc/min c/w CKD stage 3a  Situational stress with son who is an alcoholic and began drinking again. Has been to rehab multiple times. Back  at home living with patient.   History of eczema on lower legs.    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 CEA is normal on 07/31/23. Last colonoscopy 05/13/2024. Followed by her Oncologist, Jeanne Smith.    03/28/2019 Mammogram There is extensive extracapsular silicone rupture bilaterally. No mammographic evidence of Malignancy in bilateral breast.    05/13/2024 Colonoscopy Three 4 to 6 mm polyps in the proximal ascending colon, in the mid ascending colon and in the cecum. Resected and retrieved. Discovered to be tubular adenoma and sessile serrated lesion without dysplasia. One 10 mm polyp in the proximal transverse colon, Resected  and retrieved. Discovered to be tubular adenoma.  One 10 mm polyp in the mid descending colon. Resected and retrieved. Found to be tubular adenoma One 4 mm polyp in the proximal sigmoid colon, Resected and retrieved. Found to be tubular adenoma. Patent end- to- side colo- colonic anastomosis, characterized by healthy appearing mucosa. No local recurrence. Non- bleeding internal hemorrhoids. The examination was otherwise normal on direct and retroflexion views.    Last Pap smear 04/28/2024 normal.   Vaccine counseling: Influenza vaccine received today. Tetanus and Shingles vaccines due. May obtain at local pharmacy as she is on Medicare     Annual Wellness Visit done today including the all of the following: Reviewed patient's Family Medical History Reviewed patient's SDOH and reviewed tobacco, alcohol , and drug use.  Reviewed and updated list of patient's medical providers Assessment of cognitive impairment was done Assessed patient's functional ability Established a written schedule for health screening services Health Risk Assessent Completed and Reviewed  Discussed health benefits of physical activity, and encouraged her to engage in regular exercise appropriate for her age and condition.    I,Makayla C Reid,acting as a scribe for  Jeanne JINNY Hailstone, MD.,have documented all relevant documentation on the behalf of Jeanne JINNY Hailstone, MD,as directed by  Jeanne JINNY Hailstone, MD while in the presence of Jeanne JINNY Hailstone, MD.   I, Jeanne JINNY Hailstone, MD, have reviewed all documentation for and agree with the above Annual Wellness Visit documentation.  Jeanne JINNY Hailstone, MD Internal Medicine 08/25/2024

## 2024-08-25 NOTE — Telephone Encounter (Signed)
 Called PT to let her know that I rescheduled her Scan Review appt. Until after her Scan appt. Left detailed message on PT phone with day and time.

## 2024-08-25 NOTE — Patient Instructions (Addendum)
 Patient has continued situational stress with her son. Has had recent GYN exam. Do not see recent bone density study.Has follow up soon with Dr. Cloretta. Would like to see her again in 6 months.

## 2024-09-02 ENCOUNTER — Other Ambulatory Visit: Admitting: Oncology

## 2024-09-02 ENCOUNTER — Other Ambulatory Visit

## 2024-09-15 ENCOUNTER — Ambulatory Visit
Admission: RE | Admit: 2024-09-15 | Discharge: 2024-09-15 | Disposition: A | Discharge status: Home / Self Care | Source: Ambulatory Visit | Attending: Gastroenterology | Admitting: Gastroenterology

## 2024-09-15 DIAGNOSIS — K859 Acute pancreatitis without necrosis or infection, unspecified: Secondary | ICD-10-CM

## 2024-09-15 DIAGNOSIS — K7460 Unspecified cirrhosis of liver: Secondary | ICD-10-CM

## 2024-09-15 DIAGNOSIS — K760 Fatty (change of) liver, not elsewhere classified: Secondary | ICD-10-CM | POA: Diagnosis not present

## 2024-09-15 DIAGNOSIS — R935 Abnormal findings on diagnostic imaging of other abdominal regions, including retroperitoneum: Secondary | ICD-10-CM

## 2024-09-15 DIAGNOSIS — Z85038 Personal history of other malignant neoplasm of large intestine: Secondary | ICD-10-CM

## 2024-09-15 MED ORDER — GADOPICLENOL 0.5 MMOL/ML IV SOLN
5.0000 mL | Freq: Once | INTRAVENOUS | Status: AC | PRN
  Administered 2024-09-15: 5 mL via INTRAVENOUS

## 2024-09-23 ENCOUNTER — Telehealth: Payer: Self-pay | Admitting: Oncology

## 2024-09-23 NOTE — Telephone Encounter (Signed)
 PT called to reschedule appt, not feeling well.

## 2024-09-24 ENCOUNTER — Inpatient Hospital Stay

## 2024-09-24 ENCOUNTER — Inpatient Hospital Stay: Admitting: Oncology

## 2024-10-06 ENCOUNTER — Ambulatory Visit: Payer: Self-pay | Admitting: Gastroenterology

## 2024-10-06 NOTE — Progress Notes (Signed)
 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  She does understand. Glad she has stopped drinking all alcohol . Will determine further course of action at follow-up visit

## 2024-10-08 ENCOUNTER — Other Ambulatory Visit (INDEPENDENT_AMBULATORY_CARE_PROVIDER_SITE_OTHER)

## 2024-10-08 DIAGNOSIS — Z85038 Personal history of other malignant neoplasm of large intestine: Secondary | ICD-10-CM | POA: Diagnosis not present

## 2024-10-08 DIAGNOSIS — K859 Acute pancreatitis without necrosis or infection, unspecified: Secondary | ICD-10-CM | POA: Diagnosis not present

## 2024-10-08 DIAGNOSIS — R935 Abnormal findings on diagnostic imaging of other abdominal regions, including retroperitoneum: Secondary | ICD-10-CM

## 2024-10-08 DIAGNOSIS — R978 Other abnormal tumor markers: Secondary | ICD-10-CM | POA: Diagnosis not present

## 2024-10-08 DIAGNOSIS — K7460 Unspecified cirrhosis of liver: Secondary | ICD-10-CM

## 2024-10-08 LAB — COMPREHENSIVE METABOLIC PANEL WITH GFR
ALT: 33 U/L (ref 0–35)
AST: 100 U/L — ABNORMAL HIGH (ref 0–37)
Albumin: 3.5 g/dL (ref 3.5–5.2)
Alkaline Phosphatase: 206 U/L — ABNORMAL HIGH (ref 39–117)
BUN: 9 mg/dL (ref 6–23)
CO2: 25 meq/L (ref 19–32)
Calcium: 9.6 mg/dL (ref 8.4–10.5)
Chloride: 105 meq/L (ref 96–112)
Creatinine, Ser: 1.01 mg/dL (ref 0.40–1.20)
GFR: 55.14 mL/min — ABNORMAL LOW (ref >60.00)
Glucose, Bld: 115 mg/dL — ABNORMAL HIGH (ref 70–99)
Potassium: 3.6 meq/L (ref 3.5–5.1)
Sodium: 139 meq/L (ref 135–145)
Total Bilirubin: 1.9 mg/dL — ABNORMAL HIGH (ref 0.2–1.2)
Total Protein: 7.6 g/dL (ref 6.0–8.3)

## 2024-10-08 LAB — LIPASE: Lipase: 18 U/L (ref 11.0–59.0)

## 2024-10-08 LAB — CBC WITH DIFFERENTIAL/PLATELET
Basophils Absolute: 0.1 K/uL (ref 0.0–0.1)
Basophils Relative: 1.3 % (ref 0.0–3.0)
Eosinophils Absolute: 0.1 K/uL (ref 0.0–0.7)
Eosinophils Relative: 2.3 % (ref 0.0–5.0)
HCT: 38.4 % (ref 36.0–46.0)
Hemoglobin: 13.3 g/dL (ref 12.0–15.0)
Lymphocytes Relative: 25.1 % (ref 12.0–46.0)
Lymphs Abs: 1.4 K/uL (ref 0.7–4.0)
MCHC: 34.6 g/dL (ref 30.0–36.0)
MCV: 104 fl — ABNORMAL HIGH (ref 78.0–100.0)
Monocytes Absolute: 0.7 K/uL (ref 0.1–1.0)
Monocytes Relative: 12.2 % — ABNORMAL HIGH (ref 3.0–12.0)
Neutro Abs: 3.2 K/uL (ref 1.4–7.7)
Neutrophils Relative %: 59.1 % (ref 43.0–77.0)
Platelets: 101 K/uL — ABNORMAL LOW (ref 150.0–400.0)
RBC: 3.69 Mil/uL — ABNORMAL LOW (ref 3.87–5.11)
RDW: 13.4 % (ref 11.5–15.5)
WBC: 5.5 K/uL (ref 4.0–10.5)

## 2024-10-09 DIAGNOSIS — M542 Cervicalgia: Secondary | ICD-10-CM | POA: Diagnosis not present

## 2024-10-09 LAB — CANCER ANTIGEN 19-9: CA 19-9: 22 U/mL (ref <34)

## 2024-10-12 ENCOUNTER — Ambulatory Visit: Payer: Self-pay | Admitting: Gastroenterology

## 2024-10-12 NOTE — Progress Notes (Signed)
 Please inform the patient.  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  No acute pancreatitis.  Decided against ERCP due to varices. Plan: -Needs appointment in APP clinic or mine. -Needs EGD for EV screening. -Stop all alcohol .  Send report to family physician

## 2024-10-13 ENCOUNTER — Other Ambulatory Visit: Payer: Self-pay

## 2024-10-13 DIAGNOSIS — I8500 Esophageal varices without bleeding: Secondary | ICD-10-CM

## 2024-10-13 DIAGNOSIS — K7460 Unspecified cirrhosis of liver: Secondary | ICD-10-CM

## 2024-10-14 ENCOUNTER — Other Ambulatory Visit: Payer: Self-pay | Admitting: Internal Medicine

## 2024-10-14 DIAGNOSIS — I10 Essential (primary) hypertension: Secondary | ICD-10-CM

## 2024-10-20 ENCOUNTER — Encounter: Payer: Self-pay | Admitting: Gastroenterology

## 2024-10-20 ENCOUNTER — Ambulatory Visit: Admitting: Gastroenterology

## 2024-10-20 ENCOUNTER — Other Ambulatory Visit

## 2024-10-20 VITALS — BP 108/56 | HR 72 | Ht 64.25 in | Wt 120.0 lb

## 2024-10-20 DIAGNOSIS — R194 Change in bowel habit: Secondary | ICD-10-CM

## 2024-10-20 DIAGNOSIS — K7460 Unspecified cirrhosis of liver: Secondary | ICD-10-CM

## 2024-10-20 DIAGNOSIS — Z8719 Personal history of other diseases of the digestive system: Secondary | ICD-10-CM

## 2024-10-20 NOTE — Progress Notes (Signed)
 Chief Complaint: follow-up cirrhosis Primary GI Doctor: Dr. Charlanne  HPI: Jeanne Smith is a 73 y.o. female  With multiple medical problems as listed below including HTN, HLD, CKD (followed by Dr. Vickey ), chronic back pain, spinal stenosis, hypothyroidism, proximal rectal adenocarcinoma s/p LAR, incidental liver cirrhosis on CT with continued alcohol  use   Interval History Patient last seen in the GI office on 11/08/2023 by Dr. Charlanne, for rectal bleeding  The rectal bleeding has stopped since her colonoscopy. She reports fluffy oily stools that float in toilet.She reports she is going to the bathroom frequently to have a BM , three times in am after he morning coffee, and a few BMs in the evening. Mild cramping in lower abdomen with BM's. She also reports she has lost weight, about 7lbs in 3 mths. No nausea or vomiting. No hematemesis.   Patient has completely cut out alcohol . History of drinking 2-3 shots per day and had been doing it for years.  Enquires if other causes of her liver disease. Reports daughter with alopecia and sister with Psoriatic arthritis.  Patient on furosemide  20 mg po four times a week. She was previously on hydrochlorothiazide  however she had issues with hyperkalemia so it was switched. No lower extremity edema.  Wt Readings from Last 3 Encounters:  10/20/24 120 lb (54.4 kg)  08/25/24 122 lb (55.3 kg)  07/14/24 127 lb (57.6 kg)     Past Medical History:  Diagnosis Date   Anxiety    Arthritis    oa   Attention deficit disorder    Cancer [facility])    Dysrhythmia 2019   episode of palpitations x 25 January 2018, stress test normal   H/O seasonal allergies    Hyperlipidemia    Hypertension    Hypothyroidism    thyroid  nodules, off synthroid > 15 years   Insomnia    Osteoarthritis    Osteopenia    Palpitations    Pneumonia    Thyroid  nodule     Past Surgical History:  Procedure Laterality Date   ABDOMINAL HYSTERECTOMY     AUGMENTATION  MAMMAPLASTY Bilateral    silicone over 30 years   COLONOSCOPY     COLONOSCOPY WITH PROPOFOL  N/A 05/25/2022   Procedure: COLONOSCOPY WITH PROPOFOL ;  Surgeon: Charlanne Groom, MD;  Location: [facility];  Service: Gastroenterology;  Laterality: N/A;   CONVERSION TO TOTAL HIP Left 05/16/2018   Procedure: LEFT HIP ANTERIOR APPROACH;  Surgeon: Ernie Cough, MD;  Location: [facility];  Service: Orthopedics;  Laterality: Left;   FEMUR IM NAIL Right 06/16/2015   Procedure: INTRAMEDULLARY (IM) NAIL FEMORAL;  Surgeon: Evalene JONETTA Chancy, MD;  Location: [facility];  Service: Orthopedics;  Laterality: Right;   HARDWARE REMOVAL Right 09/24/2020   Procedure: REMOVAL OF IM ROD/HIP SCREW RIGHT HIP;  Surgeon: Vernetta Lonni GRADE, MD;  Location: [facility];  Service: Orthopedics;  Laterality: Right;   HARDWARE REMOVAL Right 12/24/2020   Procedure: HARDWARE REMOVAL RIGHT WRIST;  Surgeon: Jerri Kay HERO, MD;  Location: [facility];  Service: Orthopedics;  Laterality: Right;   HIP SURGERY     JOINT REPLACEMENT     ORIF RADIAL FRACTURE Right 12/24/2020   Procedure: OPEN REDUCTION INTERNAL FIXATION (ORIF) RIGHT RADIAL SHAFT FRACTURE;  Surgeon: Jerri Kay HERO, MD;  Location: [facility];  Service: Orthopedics;  Laterality: Right;   POLYPECTOMY  05/25/2022   Procedure: POLYPECTOMY;  Surgeon: Charlanne Groom, MD;  Location: [facility];  Service: Gastroenterology;;   TONSILLECTOMY  TOTAL HIP ARTHROPLASTY Right 09/24/2020   Procedure: RIGHT TOTAL HIP ARTHROPLASTY ANTERIOR APPROACH;  Surgeon: Vernetta Lonni GRADE, MD;  Location: [facility];  Service: Orthopedics;  Laterality: Right;   TUBAL LIGATION     XI ROBOTIC ASSISTED LOWER ANTERIOR RESECTION N/A 07/05/2022   Procedure: XI ROBOTIC ASSISTED LOWER ANTERIOR RESECTION;  Surgeon: Debby Hila, MD;  Location: [facility];  Service: General;  Laterality: N/A;    Current Outpatient Medications  Medication Sig Dispense Refill   ALPRAZolam  (XANAX ) 0.5 MG  tablet Take 0.5 mg by mouth as needed for anxiety.     amLODipine  (NORVASC ) 5 MG tablet Take 1 tablet (5 mg total) by mouth daily. 90 tablet 0   aspirin  81 MG chewable tablet Chew 1 tablet (81 mg total) by mouth 2 (two) times daily. (Patient taking differently: Chew 81 mg by mouth daily.) 30 tablet 0   b complex vitamins capsule Take 1 capsule by mouth daily.     estradiol  (ESTRACE ) 1 MG tablet Take 1 mg by mouth daily.     furosemide  (LASIX ) 20 MG tablet TAKE 1 TABLET(20 MG) BY MOUTH DAILY 90 tablet 1   Hypromellose, PF, (RETAINE HPMC) 0.3 % SOLN Place 1 drop into both eyes 3 (three) times daily as needed (for dryness).     Magnesium  250 MG TABS Take 250 mg by mouth at bedtime.     metoprolol  succinate (TOPROL -XL) 25 MG 24 hr tablet TAKE 1 TABLET(25 MG) BY MOUTH DAILY 90 tablet 1   metroNIDAZOLE (METROGEL) 1 % gel Apply 1 Application topically as needed.     Multiple Vitamin (MULTIVITAMIN) tablet Take 1 tablet by mouth daily.     mupirocin  ointment (BACTROBAN ) 2 % Apply 1 Application topically 2 (two) times daily. 22 g 0   potassium chloride  SA (KLOR-CON  M) 20 MEQ tablet Take 1 tablet (20 mEq total) by mouth daily. 30 tablet 3   PREVIDENT 5000 BOOSTER PLUS 1.1 % PSTE USE TO BRUSH TEETH AFTER MEALS AND BEFORE BEDTIME     Saline 0.9 % AERS Place 1 spray into both nostrils as needed (congestion).     Vitamin D , Ergocalciferol , (DRISDOL ) 1.25 MG (50000 UNIT) CAPS capsule TAKE 1 CAPSULE BY MOUTH EVERY 7 DAYS 12 capsule 3   No current facility-administered medications for this visit.    Allergies as of 10/20/2024   (No Known Allergies)    Family History  Problem Relation Age of Onset   Heart failure Mother    Hypertension Father    Cancer Father        lung   Stroke Father    Heart disease Brother    Alcoholism Brother    Colon cancer Neg Hx    Pancreatic cancer Neg Hx    Esophageal cancer Neg Hx    Liver cancer Neg Hx    Stomach cancer Neg Hx     Review of Systems:     Constitutional: No weight loss, fever, chills, weakness or fatigue HEENT: Eyes: No change in vision               Ears, Nose, Throat:  No change in hearing or congestion Skin: No rash or itching Cardiovascular: No chest pain, chest pressure or palpitations   Respiratory: No SOB or cough Gastrointestinal: See HPI and otherwise negative Genitourinary: No dysuria or change in urinary frequency Neurological: No headache, dizziness or syncope Musculoskeletal: No new muscle or joint pain Hematologic: No bleeding or bruising Psychiatric: No history of depression or anxiety  Physical Exam:  Vital signs: BP (!) 108/56   Pulse 72   Ht 5' 4.25 (1.632 m)   Wt 120 lb (54.4 kg)   BMI 20.44 kg/m   Constitutional:   Pleasant female appears to be in NAD, Well developed, Well nourished, alert and cooperative Eyes:   PEERL, EOMI. No icterus. Conjunctiva pink. Neck:  Supple Throat: Oral cavity and pharynx without inflammation, swelling or lesion.  Respiratory: Respirations even and unlabored. Lungs clear to auscultation bilaterally.   No wheezes, crackles, or rhonchi.  Cardiovascular: Normal S1, S2. Regular rate and rhythm. No peripheral edema, cyanosis or pallor.  Gastrointestinal:  Soft, nondistended, nontender. No rebound or guarding. Normal bowel sounds.  Rectal:  Not performed.  Msk:  Symmetrical without gross deformities. Without edema, no deformity or joint abnormality.  Neurologic:  Alert and  oriented x4;  grossly normal neurologically. No asterixis.  Skin:   Dry and intact without significant lesions or rashes.  RELEVANT LABS AND IMAGING: CBC    Latest Ref Rng & Units 10/08/2024   11:43 AM 06/19/2024   10:01 AM 11/08/2023   12:04 PM  CBC  WBC 4.0 - 10.5 K/uL 5.5  6.2  6.8   Hemoglobin 12.0 - 15.0 g/dL 86.6  87.1  89.5   Hematocrit 36.0 - 46.0 % 38.4  39.1  30.3   Platelets 150.0 - 400.0 K/uL 101.0  123  123    128.0      CMP     Latest Ref Rng & Units 10/08/2024   11:43  AM 07/29/2024    9:32 AM 07/08/2024    2:05 PM  CMP  Glucose 70 - 99 mg/dL 884  860  886   BUN 6 - 23 mg/dL 9  11  11    Creatinine 0.40 - 1.20 mg/dL 8.98  9.01  8.78   Sodium 135 - 145 mEq/L 139  139  140   Potassium 3.5 - 5.1 mEq/L 3.6  3.9  3.1   Chloride 96 - 112 mEq/L 105  106  102   CO2 19 - 32 mEq/L 25  22  21    Calcium  8.4 - 10.5 mg/dL 9.6  9.3  9.8   Total Protein 6.0 - 8.3 g/dL 7.6     Total Bilirubin 0.2 - 1.2 mg/dL 1.9     Alkaline Phos 39 - 117 U/L 206     AST 0 - 37 U/L 100     ALT 0 - 35 U/L 33        Lab Results  Component Value Date   TSH 1.21 06/19/2024  11/08/23 labs show: CBC, CMP- potassium 2.8, Creat 1.42, BUN 12, ALT 38, AST 116, alk phosp , plt 128, hgb 10.4 262,INR/PT-1.3/13.3, AFP-7.6 03/21/24 CEA 6.50 06/13/24 CEA 8.7, AFP 6.8 10/08/24 CA 19-9 (22)  NASH fibrosure plus: fibrosis score 0.82, steatosis score 0.91, NASH score 0.97  Imaging: 07/08/24 CT chest abd/pelvis IMPRESSION: 1. No evidence of recurrent or metastatic disease. 2. Chronic calcific pancreatitis with haziness adjacent to the pancreatic tail, indicative of superimposed acute pancreatitis. 3. New pancreatic ductal dilatation, likely related to large calcifications in the pancreatic head and uncinate process. Difficult to definitively exclude an obstructing lesion. 4. Cirrhosis with steatosis and portal hypertension. 5. Tiny left renal stone. 6. Aortic atherosclerosis (ICD10-I70.0). Coronary artery calcification.  09/15/2024 MRI/MRCP IMPRESSION: 1. Sequela of chronic pancreatitis with main ductal dilation upstream a 10 mm filling defect at the level of the ampulla. Additional 4 mm filling defect in the  upstream duct in the region of the pancreatic body. 2. Cirrhosis with severe steatosis. No suspicious focal hepatic lesions. 3. Extensive upper abdominal varices and trace perihepatic ascites.  GI procedures: 05/13/24 colonoscopy - Three 4 to 6 mm polyps in the proximal ascending  colon, in the mid ascending colon and in the cecum, removed with a cold snare. Resected and retrieved. - One 10 mm polyp in the proximal transverse colon, removed with a hot snare. Resected and retrieved. - One 10 mm polyp in the mid descending colon, removed with a hot snare. Resected and retrieved. - One 4 mm polyp in the proximal sigmoid colon, removed with a cold snare. Resected and retrieved. - Patent end- to- side colo- colonic anastomosis, characterized by healthy appearing mucosa. No local recurrence. - Non- bleeding internal hemorrhoids. - The examination was otherwise normal on direct and retroflexion views. Path:  FINAL DIAGNOSIS        1. Surgical [P], colon, ascending and cecum, polyp (3) :       -  TUBULAR ADENOMA, 2 FRAGMENTS.       -  SESSILE SERRATED LESION WITHOUT DYSPLASIA, 1 FRAGMENT.        2. Surgical [P], colon, transverse, polyp (1) :       -  TUBULAR ADENOMA, FRAGMENTS.        3. Surgical [P], colon, descending, polyp (1) :       -  TUBULAR ADENOMA, FRAGMENTS.        4. Surgical [P], colon, sigmoid, polyp (1) :       -  TUBULAR ADENOMA.   Assessment: Rectal AdenoCa s/p LAR 06/2022 (T2N0M0). 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 AFP-7.6>6.8. CA 19-9 (22). CEA 3.41>6.50>8.70. NASH fibrosure plus: fibrosis score 0.82, steatosis score 0.91, NASH score 0.97. Hx of alcohol  use. 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  No acute pancreatitis.  Decided against ERCP due to varices. Altered bowel habits, weight loss, oily stools, r/o pancreatic insufficiency  History of Chronic constipation - denies today  Plan: -Stop all ETOH  -stool elastase - samples Creon 36,000 units with meals and snacks  - liver lab workup (ANA, AMA, Anti-smooth muscle antibody, Hepatitis A IgG,Hepatitis B surface antigen, Hepatitis B surface antibody,  HCV antibody, ferritin, TIBC,  Alpha 1 antitrypsin,  ceruloplasmin, tTG, total IgA, PT/INR, IgG) -  EGD scheduled 11/11/24 for EV screening. High risk for pancreatic sphincerotomy/ stone extraction (ERCP) d/t cirrhosis with portal hypertension and presence of varices. -defer additional testing/imaging to Dr. Charlanne -follow-up as scheduled with hematology/oncology    Thank you for the courtesy of this consult. Please call me with any questions or concerns.   Trevonte Ashkar, FNP-C [facility] 10/20/2024, 4:48 PM  Cc: Perri Ronal PARAS, MD

## 2024-10-20 NOTE — Patient Instructions (Addendum)
 Pancreatic insufficiency Creon 2 capsules with meals, 1 capsule with snacks  Cirrhosis Stop all alcohol  Contact office when you are ready to pursue other labs we discussed  Your provider has requested that you go to the basement level for lab work before leaving today. Press B on the elevator. The lab is located at the first door on the left as you exit the elevator.  _______________________________________________________  If your blood pressure at your visit was 140/90 or greater, please contact your primary care physician to follow up on this.  _______________________________________________________  If you are age 73 or older, your body mass index should be between 23-30. Your Body mass index is 20.44 kg/m. If this is out of the aforementioned range listed, please consider follow up with your Primary Care Provider.  If you are age 62 or younger, your body mass index should be between 19-25. Your Body mass index is 20.44 kg/m. If this is out of the aformentioned range listed, please consider follow up with your Primary Care Provider.   ________________________________________________________  The [facility] providers would like to encourage you to use MYCHART to communicate with providers for non-urgent requests or questions.  Due to long hold times on the telephone, sending your provider a message by [facility] may be a faster and more efficient way to get a response.  Please allow 48 business hours for a response.  Please remember that this is for non-urgent requests.  _______________________________________________________  [facility] is using a team-based approach to care.  Your team is made up of your doctor and two to three APPS. Our APPS (Nurse Practitioners and Physician Assistants) work with your physician to ensure care continuity for you. They are fully qualified to address your health concerns and develop a treatment plan. They communicate directly with your  gastroenterologist to care for you. Seeing the Advanced Practice Practitioners on your physician's team can help you by facilitating care more promptly, often allowing for earlier appointments, access to diagnostic testing, procedures, and other specialty referrals.   Thank you for trusting me with your gastrointestinal care. Deanna May, FNP-C

## 2024-11-06 ENCOUNTER — Inpatient Hospital Stay (HOSPITAL_COMMUNITY)
Admission: EM | Admit: 2024-11-06 | Discharge: 2024-11-08 | DRG: 193 | Disposition: A | Attending: Internal Medicine | Admitting: Internal Medicine

## 2024-11-06 ENCOUNTER — Ambulatory Visit: Payer: Self-pay

## 2024-11-06 ENCOUNTER — Emergency Department (HOSPITAL_COMMUNITY)

## 2024-11-06 ENCOUNTER — Other Ambulatory Visit: Payer: Self-pay

## 2024-11-06 DIAGNOSIS — R404 Transient alteration of awareness: Secondary | ICD-10-CM

## 2024-11-06 DIAGNOSIS — R410 Disorientation, unspecified: Secondary | ICD-10-CM | POA: Diagnosis not present

## 2024-11-06 DIAGNOSIS — R4182 Altered mental status, unspecified: Secondary | ICD-10-CM | POA: Diagnosis present

## 2024-11-06 DIAGNOSIS — R000 Tachycardia, unspecified: Secondary | ICD-10-CM | POA: Diagnosis not present

## 2024-11-06 DIAGNOSIS — A419 Sepsis, unspecified organism: Secondary | ICD-10-CM

## 2024-11-06 DIAGNOSIS — R059 Cough, unspecified: Secondary | ICD-10-CM | POA: Diagnosis not present

## 2024-11-06 DIAGNOSIS — R509 Fever, unspecified: Secondary | ICD-10-CM | POA: Diagnosis not present

## 2024-11-06 DIAGNOSIS — R0689 Other abnormalities of breathing: Secondary | ICD-10-CM | POA: Diagnosis not present

## 2024-11-06 DIAGNOSIS — I959 Hypotension, unspecified: Secondary | ICD-10-CM | POA: Diagnosis not present

## 2024-11-06 LAB — CBC WITH DIFFERENTIAL/PLATELET
Abs Immature Granulocytes: 0.07 K/uL (ref 0.00–0.07)
Basophils Absolute: 0 K/uL (ref 0.0–0.1)
Basophils Relative: 0 %
Eosinophils Absolute: 0 K/uL (ref 0.0–0.5)
Eosinophils Relative: 0 %
HCT: 36.5 % (ref 36.0–46.0)
Hemoglobin: 12.9 g/dL (ref 12.0–15.0)
Immature Granulocytes: 1 %
Lymphocytes Relative: 6 %
Lymphs Abs: 0.6 K/uL — ABNORMAL LOW (ref 0.7–4.0)
MCH: 34.4 pg — ABNORMAL HIGH (ref 26.0–34.0)
MCHC: 35.3 g/dL (ref 30.0–36.0)
MCV: 97.3 fL (ref 80.0–100.0)
Monocytes Absolute: 0.7 K/uL (ref 0.1–1.0)
Monocytes Relative: 7 %
Neutro Abs: 8.8 K/uL — ABNORMAL HIGH (ref 1.7–7.7)
Neutrophils Relative %: 86 %
Platelets: 74 K/uL — ABNORMAL LOW (ref 150–400)
RBC: 3.75 MIL/uL — ABNORMAL LOW (ref 3.87–5.11)
RDW: 13.3 % (ref 11.5–15.5)
Smear Review: NORMAL
WBC: 10.2 K/uL (ref 4.0–10.5)
nRBC: 0 % (ref 0.0–0.2)

## 2024-11-06 LAB — URINALYSIS, W/ REFLEX TO CULTURE (INFECTION SUSPECTED)
Bilirubin Urine: NEGATIVE
Glucose, UA: NEGATIVE mg/dL
Hgb urine dipstick: NEGATIVE
Ketones, ur: NEGATIVE mg/dL
Nitrite: NEGATIVE
Protein, ur: NEGATIVE mg/dL
Specific Gravity, Urine: 1.019 (ref 1.005–1.030)
WBC, UA: >50 WBC/hpf (ref 0–5)
pH: 7 (ref 5.0–8.0)

## 2024-11-06 LAB — I-STAT CG4 LACTIC ACID, ED
Lactic Acid, Venous: 2.9 mmol/L (ref 0.5–1.9)
Lactic Acid, Venous: 1.8 mmol/L (ref 0.5–1.9)

## 2024-11-06 LAB — COMPREHENSIVE METABOLIC PANEL WITH GFR
ALT: 25 U/L (ref 0–44)
AST: 77 U/L — ABNORMAL HIGH (ref 15–41)
Albumin: 3 g/dL — ABNORMAL LOW (ref 3.5–5.0)
Alkaline Phosphatase: 214 U/L — ABNORMAL HIGH (ref 38–126)
Anion gap: 14 (ref 5–15)
BUN: 24 mg/dL — ABNORMAL HIGH (ref 8–23)
CO2: 18 mmol/L — ABNORMAL LOW (ref 22–32)
Calcium: 8.8 mg/dL — ABNORMAL LOW (ref 8.9–10.3)
Chloride: 100 mmol/L (ref 98–111)
Creatinine, Ser: 1.25 mg/dL — ABNORMAL HIGH (ref 0.44–1.00)
GFR, Estimated: 45 mL/min — ABNORMAL LOW (ref >60)
Glucose, Bld: 138 mg/dL — ABNORMAL HIGH (ref 70–99)
Potassium: 3 mmol/L — ABNORMAL LOW (ref 3.5–5.1)
Sodium: 132 mmol/L — ABNORMAL LOW (ref 135–145)
Total Bilirubin: 2.1 mg/dL — ABNORMAL HIGH (ref 0.0–1.2)
Total Protein: 6.9 g/dL (ref 6.5–8.1)

## 2024-11-06 LAB — RESP PANEL BY RT-PCR (RSV, FLU A&B, COVID)  RVPGX2
Influenza A by PCR: NEGATIVE
Influenza B by PCR: NEGATIVE
Resp Syncytial Virus by PCR: NEGATIVE
SARS Coronavirus 2 by RT PCR: NEGATIVE

## 2024-11-06 MED ORDER — ENOXAPARIN SODIUM 40 MG/0.4ML IJ SOSY
40.0000 mg | PREFILLED_SYRINGE | INTRAMUSCULAR | Status: DC
  Administered 2024-11-07 – 2024-11-08 (×2): 40 mg via SUBCUTANEOUS
  Filled 2024-11-06 (×2): qty 0.4

## 2024-11-06 MED ORDER — PROCHLORPERAZINE EDISYLATE 10 MG/2ML IJ SOLN: 5.0000 mg | Freq: Four times a day (QID) | INTRAMUSCULAR | Status: DC | PRN

## 2024-11-06 MED ORDER — SODIUM CHLORIDE 0.9 % IV SOLN
1.0000 g | Freq: Once | INTRAVENOUS | Status: AC
  Administered 2024-11-06: 1 g via INTRAVENOUS
  Filled 2024-11-06: qty 10

## 2024-11-06 MED ORDER — MELATONIN 5 MG PO TABS: 5.0000 mg | ORAL_TABLET | Freq: Every evening | ORAL | Status: DC | PRN

## 2024-11-06 MED ORDER — ALPRAZOLAM 0.5 MG PO TABS
0.5000 mg | ORAL_TABLET | Freq: Once | ORAL | Status: AC
  Administered 2024-11-06: 0.5 mg via ORAL
  Filled 2024-11-06: qty 1

## 2024-11-06 MED ORDER — POTASSIUM CHLORIDE CRYS ER 20 MEQ PO TBCR
40.0000 meq | EXTENDED_RELEASE_TABLET | Freq: Once | ORAL | Status: AC
  Administered 2024-11-06: 40 meq via ORAL
  Filled 2024-11-06: qty 2

## 2024-11-06 MED ORDER — IOHEXOL 300 MG/ML  SOLN
80.0000 mL | Freq: Once | INTRAMUSCULAR | Status: AC | PRN
  Administered 2024-11-06: 80 mL via INTRAVENOUS

## 2024-11-06 MED ORDER — SODIUM CHLORIDE 0.9 % IV BOLUS
1000.0000 mL | Freq: Once | INTRAVENOUS | Status: AC
  Administered 2024-11-06: 1000 mL via INTRAVENOUS

## 2024-11-06 MED ORDER — POLYETHYLENE GLYCOL 3350 17 G PO PACK: 17.0000 g | PACK | Freq: Every day | ORAL | Status: DC | PRN

## 2024-11-06 MED ORDER — POTASSIUM CHLORIDE IN NACL 40-0.9 MEQ/L-% IV SOLN
INTRAVENOUS | Status: AC
  Filled 2024-11-06 (×2): qty 1000

## 2024-11-06 MED ORDER — SODIUM CHLORIDE 0.9 % IV SOLN
500.0000 mg | Freq: Once | INTRAVENOUS | Status: AC
  Administered 2024-11-06: 500 mg via INTRAVENOUS
  Filled 2024-11-06: qty 5

## 2024-11-06 MED ORDER — ACETAMINOPHEN 500 MG PO TABS: 500.0000 mg | ORAL_TABLET | Freq: Four times a day (QID) | ORAL | Status: DC | PRN

## 2024-11-06 NOTE — ED Triage Notes (Addendum)
 BIBA for new confusion. Alert only to self at this time. Pt had a recent dental procedure, began having a cough and congestion shortly after. 1 neb, 500 LR given  PTA. 20 LAC 98/52 bp 110 hr 19 etco2 28 rr 120 cbg

## 2024-11-06 NOTE — H&P (Incomplete)
 History and Physical  NURIYAH HANLINE FMW:[id number] DOB: 1950/12/19 DOA: 11/06/2024  Referring physician: Dr. Patt, EDP  PCP: Perri Ronal PARAS, MD  Outpatient Specialists: GI, oncology, neurology. Patient coming from: Home.  Chief Complaint: Altered mental status.  HPI: Jeanne Smith is a 73 y.o. female with medical history significant for hypertension, hyperlipidemia, CKD 3B, chronic back pain, spinal stenosis, hypothyroidism, proximal rectal adenocarcinoma status for resection, liver cirrhosis, alcohol  use, states last alcohol  intake was about 4 months ago, presents to the ER due to altered mental status.  Endorses feeling very fatigued with mild cough for the past few days.  In the ER, tachycardic and tachypneic.  Chest x-ray revealed no acute intrathoracic process.    CT chest abdomen and pelvis with contrast revealed slight right-sided pleural effusion, mild peribronchial bronchovascular heterogeneous ground glass disease in the central right upper lobe, possible pneumonia.  Slightly increased bilateral perinephric fat stranding.  Appearance is suggestive of pyelonephritis.  Suggest correlation with urinalysis.    Small nonobstructing left kidney stone.  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  Distended gallbladder with mild diffuse gallbladder wall thickening no calcified gallstone.  Gallbladder wall thickening is nonspecific in the setting of liver disease, ultrasound follow-up if deemed appropriate.  Chronic pancreatitis with diffuse ductal dilatation no acute inflammation.  Large stool burden.  Aortic atherosclerosis.  Started on broad-spectrum IV antibiotics for CAP and bilateral polynephritis, Rocephin  and azithromycin.  Urine culture and peripheral blood cultures x 2 were obtained.  Admitted by [facility], hospitalist service.  ED Course: Temperature 98.9.  BP 134/78, HR 104, RR 22, O2 sat 99% on room air.  Review of Systems: Review of  systems as noted in the HPI. All other systems reviewed and are negative.   Past Medical History:  Diagnosis Date   Anxiety    Arthritis    oa   Attention deficit disorder    Cancer [facility])    Dysrhythmia 2019   episode of palpitations x 25 January 2018, stress test normal   H/O seasonal allergies    Hyperlipidemia    Hypertension    Hypothyroidism    thyroid  nodules, off synthroid > 15 years   Insomnia    Osteoarthritis    Osteopenia    Palpitations    Pneumonia    Thyroid  nodule    Past Surgical History:  Procedure Laterality Date   ABDOMINAL HYSTERECTOMY     AUGMENTATION MAMMAPLASTY Bilateral    silicone over 30 years   COLONOSCOPY     COLONOSCOPY WITH PROPOFOL  N/A 05/25/2022   Procedure: COLONOSCOPY WITH PROPOFOL ;  Surgeon: Charlanne Groom, MD;  Location: [facility];  Service: Gastroenterology;  Laterality: N/A;   CONVERSION TO TOTAL HIP Left 05/16/2018   Procedure: LEFT HIP ANTERIOR APPROACH;  Surgeon: Ernie Cough, MD;  Location: [facility];  Service: Orthopedics;  Laterality: Left;   FEMUR IM NAIL Right 06/16/2015   Procedure: INTRAMEDULLARY (IM) NAIL FEMORAL;  Surgeon: Evalene JONETTA Chancy, MD;  Location: [facility];  Service: Orthopedics;  Laterality: Right;   HARDWARE REMOVAL Right 09/24/2020   Procedure: REMOVAL OF IM ROD/HIP SCREW RIGHT HIP;  Surgeon: Vernetta Lonni GRADE, MD;  Location: [facility];  Service: Orthopedics;  Laterality: Right;   HARDWARE REMOVAL Right 12/24/2020   Procedure: HARDWARE REMOVAL RIGHT WRIST;  Surgeon: Jerri Kay HERO, MD;  Location: [facility];  Service: Orthopedics;  Laterality: Right;   HIP SURGERY     JOINT REPLACEMENT     ORIF  RADIAL FRACTURE Right 12/24/2020   Procedure: OPEN REDUCTION INTERNAL FIXATION (ORIF) RIGHT RADIAL SHAFT FRACTURE;  Surgeon: Jerri Kay HERO, MD;  Location: [facility];  Service: Orthopedics;  Laterality: Right;   POLYPECTOMY  05/25/2022   Procedure: POLYPECTOMY;  Surgeon: Charlanne Groom, MD;   Location: [facility];  Service: Gastroenterology;;   TONSILLECTOMY     TOTAL HIP ARTHROPLASTY Right 09/24/2020   Procedure: RIGHT TOTAL HIP ARTHROPLASTY ANTERIOR APPROACH;  Surgeon: Vernetta Lonni GRADE, MD;  Location: [facility];  Service: Orthopedics;  Laterality: Right;   TUBAL LIGATION     XI ROBOTIC ASSISTED LOWER ANTERIOR RESECTION N/A 07/05/2022   Procedure: XI ROBOTIC ASSISTED LOWER ANTERIOR RESECTION;  Surgeon: Debby Hila, MD;  Location: [facility];  Service: General;  Laterality: N/A;    Social History:  reports that she quit smoking about 18 years ago. Her smoking use included cigarettes. She smoked an average of 0.3 packs per day. She has never used smokeless tobacco. She reports current alcohol  use of about 14.0 standard drinks of alcohol  per week. She reports that she does not use drugs.   Allergies[1]  Family History  Problem Relation Age of Onset   Heart failure Mother    Hypertension Father    Cancer Father        lung   Stroke Father    Heart disease Brother    Alcoholism Brother    Colon cancer Neg Hx    Pancreatic cancer Neg Hx    Esophageal cancer Neg Hx    Liver cancer Neg Hx    Stomach cancer Neg Hx       Prior to Admission medications  Medication Sig Start Date End Date Taking? Authorizing Provider  ALPRAZolam  (XANAX ) 0.5 MG tablet Take 0.5 mg by mouth as needed for anxiety.    [provider]  amLODipine  (NORVASC ) 5 MG tablet Take 1 tablet (5 mg total) by mouth daily. 05/22/24   Perri Ronal PARAS, MD  aspirin  81 MG chewable tablet Chew 1 tablet (81 mg total) by mouth 2 (two) times daily. Patient taking differently: Chew 81 mg by mouth daily. 09/25/20   Vernetta Lonni GRADE, MD  b complex vitamins capsule Take 1 capsule by mouth daily.    [provider]  estradiol  (ESTRACE ) 1 MG tablet Take 1 mg by mouth daily.    [provider]  furosemide  (LASIX ) 20 MG tablet TAKE 1 TABLET(20 MG) BY MOUTH DAILY 08/21/23   Baxley, Ronal PARAS, MD   Hypromellose, PF, (RETAINE [facility]) 0.3 % SOLN Place 1 drop into both eyes 3 (three) times daily as needed (for dryness).    [provider]  Magnesium  250 MG TABS Take 250 mg by mouth at bedtime.    [provider]  metoprolol  succinate (TOPROL -XL) 25 MG 24 hr tablet TAKE 1 TABLET(25 MG) BY MOUTH DAILY 10/14/24   Perri Ronal PARAS, MD  metroNIDAZOLE (METROGEL) 1 % gel Apply 1 Application topically as needed. 06/18/23   [provider]  Multiple Vitamin (MULTIVITAMIN) tablet Take 1 tablet by mouth daily.    [provider]  mupirocin  ointment (BACTROBAN ) 2 % Apply 1 Application topically 2 (two) times daily. 09/10/23   Perri Ronal PARAS, MD  potassium chloride  SA (KLOR-CON  M) 20 MEQ tablet Take 1 tablet (20 mEq total) by mouth daily. 07/14/24   Perri Ronal PARAS, MD  PREVIDENT 5000 BOOSTER PLUS 1.1 % PSTE USE TO BRUSH TEETH AFTER MEALS AND BEFORE BEDTIME 05/29/23   [provider]  Saline 0.9 % AERS Place 1 spray into both nostrils as needed (congestion).    [provider]  Vitamin D , Ergocalciferol , (DRISDOL ) 1.25 MG (50000 UNIT) CAPS capsule TAKE 1 CAPSULE BY MOUTH EVERY 7 DAYS 10/08/23   Perri Ronal PARAS, MD    Physical Exam: BP 112/70   Pulse 97   Temp 98.7 F (37.1 C) (Oral)   Resp 20   SpO2 94%   General: 73 y.o. year-old female well developed well nourished in no acute distress.  Alert and oriented x2. Cardiovascular: Regular rate and rhythm with no rubs or gallops.  No thyromegaly or JVD noted.  No lower extremity edema. 2/4 pulses in all 4 extremities. Respiratory: Clear to auscultation with no wheezes or rales. Good inspiratory effort. Abdomen: Soft nontender nondistended with normal bowel sounds x4 quadrants. Muskuloskeletal: No cyanosis, clubbing or edema noted bilaterally Neuro: CN II-XII intact, strength, sensation, reflexes Skin: No ulcerative lesions noted or rashes Psychiatry: Judgement and insight appear mildly altered. Mood is  appropriate for condition and setting          Labs on Admission:  Basic Metabolic Panel: Recent Labs  Lab 11/06/24 1827  NA 132*  K 3.0*  CL 100  CO2 18*  GLUCOSE 138*  BUN 24*  CREATININE 1.25*  CALCIUM  8.8*   Liver Function Tests: Recent Labs  Lab 11/06/24 1827  AST 77*  ALT 25  ALKPHOS 214*  BILITOT 2.1*  PROT 6.9  ALBUMIN 3.0*   No results for input(s): LIPASE, AMYLASE in the last 168 hours. No results for input(s): AMMONIA in the last 168 hours. CBC: Recent Labs  Lab 11/06/24 1827  WBC 10.2  NEUTROABS 8.8*  HGB 12.9  HCT 36.5  MCV 97.3  PLT 74*   Cardiac Enzymes: No results for input(s): CKTOTAL, CKMB, CKMBINDEX, TROPONINI in the last 168 hours.  BNP (last 3 results) No results for input(s): BNP in the last 8760 hours.  ProBNP (last 3 results) No results for input(s): PROBNP in the last 8760 hours.  CBG: No results for input(s): GLUCAP in the last 168 hours.  Radiological Exams on Admission: CT HEAD WO CONTRAST ( ) Result Date: 11/06/2024 CLINICAL DATA:  Altered mental status EXAM: CT HEAD WITHOUT CONTRAST TECHNIQUE: Contiguous axial images were obtained from the base of the skull through the vertex without intravenous contrast. RADIATION DOSE REDUCTION: This exam was performed according to the departmental dose-optimization program which includes automated exposure control, adjustment of the mA and/or kV according to patient size and/or use of iterative reconstruction technique. COMPARISON:  None Available. FINDINGS: Brain: No acute territorial infarction, hemorrhage or intracranial mass. Mild atrophy. Non enlarged ventricles Vascular: No hyperdense vessels.  Carotid vascular calcification Skull: Normal. Negative for fracture or focal lesion. Sinuses/Orbits: Mucosal thickening in the sinuses Other: None IMPRESSION: 1. No CT evidence for acute intracranial abnormality. Electronically Signed   By: Luke Bun M.D.   On: 11/06/2024  20:40   CT CHEST ABDOMEN PELVIS W CONTRAST Result Date: 11/06/2024 CLINICAL DATA:  Sepsis recent dental procedure EXAM: CT CHEST, ABDOMEN, AND PELVIS WITH CONTRAST TECHNIQUE: Multidetector CT imaging of the chest, abdomen and pelvis was performed following the standard protocol during bolus administration of intravenous contrast. RADIATION DOSE REDUCTION: This exam was performed according to the departmental dose-optimization program which includes automated exposure control, adjustment of the mA and/or kV according to patient size and/or use of iterative reconstruction technique. CONTRAST:  80mL OMNIPAQUE  IOHEXOL  300 MG/ML  SOLN COMPARISON:  Chest x-ray  11/06/2024, CT 07/08/2024 FINDINGS: CT CHEST FINDINGS Cardiovascular: Advanced aortic atherosclerosis. No aneurysm. Multi vessel coronary vascular calcification. Normal cardiac size. No pericardial effusion Mediastinum/Nodes: Patent trachea. No thyroid  mass. Subcentimeter mediastinal lymph nodes. Esophagus within normal limits. Lungs/Pleura: Small right-sided pleural effusion. Mild peribronchovascular heterogeneous ground-glass disease in the central right upper lobe, series 7, image 44 through 53. Otherwise no focal airspace disease is seen. Musculoskeletal: Sternum appears intact. Multilevel degenerative changes. No acute osseous abnormality. Calcified bilateral breast implants. Appearance of right implant suspect for rupture. CT ABDOMEN PELVIS FINDINGS Hepatobiliary: Liver cirrhosis. No calcified gallstone. Mild diffuse gallbladder wall thickening. Gallbladder is distended. Pancreas: Atrophic. Numerous calcifications consistent with chronic pancreatitis. Diffuse ductal dilatation. Spleen: Appears slightly enlarged, oblique craniocaudal measurement of 13.5 cm. Adrenals/Urinary Tract: Adrenal glands are normal. Kidneys show no hydronephrosis. Increased bilateral perinephric fat stranding compared to prior. Heterogenous cortical hypoenhancement left kidney best  seen on delayed images, for example lower pole series 12, image 31. Bladder obscured by artifact from hip prosthesis. Small left kidney stone Stomach/Bowel: Stomach within normal limits. No dilated small bowel. No acute bowel wall thickening. Large stool burden Vascular/Lymphatic: Advanced aortic atherosclerosis. No aneurysm. Multiple subcentimeter retroperitoneal lymph nodes. Large upper abdominal varices. Reproductive: Obscured by artifact from hip prosthesis Other: No free air.  Small volume perihepatic ascites. Musculoskeletal: Bilateral hip replacements. No acute osseous abnormality. IMPRESSION: 1. Small right-sided pleural effusion. Mild peribronchovascular heterogeneous ground-glass disease in the central right upper lobe, possible pneumonia. 2. Slight increased bilateral perinephric fat stranding. Cortical hypoenhancement left kidney best seen on delayed views, appearance is suggestive of pyelonephritis. Suggest correlation with urinalysis. Small nonobstructing left kidney stone. 3. Liver cirrhosis with evidence of portal hypertension. Small volume perihepatic ascites. 4. Distended gallbladder with mild diffuse gallbladder wall thickening. No calcified gallstone. Gallbladder wall thickening is nonspecific in the setting of liver disease. Ultrasound follow-up if deemed appropriate. 5. Chronic pancreatitis with diffuse ductal dilatation. No acute inflammation. 6. Large stool burden. 7. Aortic atherosclerosis. Aortic Atherosclerosis (ICD10-I70.0). Electronically Signed   By: Luke Bun M.D.   On: 11/06/2024 20:38   DG Chest 2 View Result Date: 11/06/2024 CLINICAL DATA:  Cough, fever EXAM: CHEST - 2 VIEW COMPARISON:  11/15/2023 FINDINGS: Frontal and lateral views of the chest are obtained, evaluation limited by overlying artifact. Cardiac silhouette is unremarkable. No airspace disease, effusion, or pneumothorax. No acute bony abnormalities. IMPRESSION: 1. Limited study due to overlying artifact. No acute  intrathoracic process. Electronically Signed   By: Ozell Daring M.D.   On: 11/06/2024 19:04    EKG: I independently viewed the EKG done and my findings are as followed: Sinus tachycardia rate of 107.  QTc 453.  Assessment/Plan Present on Admission:  AMS (altered mental status)  Principal Problem:   AMS (altered mental status)  Acute metabolic encephalopathy secondary to pneumonia, bilateral pyelonephritis, POA Head CT was nonacute. Continue to treat underlying conditions Monitor fever curve and WBCs Monitor cultures Reorient as needed.  Community-acquired pneumonia, POA The patient was started on Rocephin  and azithromycin in the ER, continue Continue bronchodilator nebulizers As needed antitussives Early mobilization  Bilateral pyelonephritis, seen on CT scan UA positive for pyuria Continue Rocephin  Monitor urine culture for ID and sensitivities. Pain control  Elevated LFTs Alkaline phosphatase 214 AST 77 Advised to completely cease alcohol  use  Hypokalemia Repleted intravenously Repeat chemistry panel in the morning Check magnesium  level  Non anion gap metabolic acidosis Serum bicarb 18, anion gap 14 Continue IV fluid hydration Repeat chemistry panel in the morning.  Mild hypovolemic hyponatremia Serum sodium 132 Encourage oral intake Continue NS IV fluid Repeat labs in the morning.  CKD 3B Renal function is at baseline Avoid nephrotoxic agents, dehydration, and hypotension. Monitor urine output  Generalized weakness PT OT evaluation Fall precautions.  Constipation Bowel regimen in place  Alcohol  use disorder Denies recent use of alcohol  No alcohol  intake in more than 4 months  Incidental findings on CT scan: Small nonobstructing left kidney stone.   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   Distended gallbladder with mild diffuse gallbladder wall thickening no calcified gallstone.   Gallbladder  wall thickening is nonspecific in the setting of liver disease, ultrasound follow-up if deemed appropriate.   Chronic pancreatitis with diffuse ductal dilatation no acute inflammation.   Large stool burden.   Aortic atherosclerosis.   Time: 75 minutes.    DVT prophylaxis: Subcu Lovenox  daily.  Code Status: Full code.  Family Communication: None at bedside.  Disposition Plan: Admitted to telemetry unit  Consults called: None.  Admission status: Observation status.   Status is: Observation    Terry LOISE Hurst MD [facility] Pager [phone]  If 7PM-7AM, please contact night-coverage [email or web address] Password [email or web address]  11/06/2024, 9:46 PM      [1] No Known Allergies

## 2024-11-06 NOTE — Telephone Encounter (Signed)
 Attempt # 1 to reach patient to triage symptoms. Left VM to call back   Clifford (significant other called in initially) not on DPR.  Reached out to [facility]--- Patient was picked up by EMS and [facility] to [facility] for possible Pneumonia  Copied from CRM #[id number]. Topic: Clinical - Red Word Triage >> Nov 06, 2024  4:29 PM Delon DASEN wrote: Red Word that prompted transfer to Nurse Triage: Cough, fever, lethargic, body aches, will  not get up to even go to bathroom

## 2024-11-06 NOTE — ED Notes (Signed)
 Patient transported to CT

## 2024-11-06 NOTE — ED Provider Notes (Signed)
 [place] EMERGENCY DEPARTMENT AT [facility] Provider Note   CSN: [id number] Arrival date & time: 11/06/24  1745     Patient presents with: Altered Mental Status   Jeanne Smith is a 73 y.o. female history of hypertension, here presenting with confusion and chills and altered mental status.  Patient just had a root canal done 6 days ago and is on amoxicillin.  Patient has become progressively more confused for the last 2 to 3 days.  Patient also has some chills and productive cough.  EMS was called and patient was noted to be hypotensive with blood pressure in the 90s and tachycardic.  History is by boyfriend who is at bedside.  Patient appears to be very confused and has repetitive questions.   The history is provided by the patient and a relative.       Prior to Admission medications  Medication Sig Start Date End Date Taking? Authorizing Provider  ALPRAZolam  (XANAX ) 0.5 MG tablet Take 0.5 mg by mouth as needed for anxiety.    [provider]  amLODipine  (NORVASC ) 5 MG tablet Take 1 tablet (5 mg total) by mouth daily. 05/22/24   Perri Ronal PARAS, MD  aspirin  81 MG chewable tablet Chew 1 tablet (81 mg total) by mouth 2 (two) times daily. Patient taking differently: Chew 81 mg by mouth daily. 09/25/20   Vernetta Lonni GRADE, MD  b complex vitamins capsule Take 1 capsule by mouth daily.    [provider]  estradiol  (ESTRACE ) 1 MG tablet Take 1 mg by mouth daily.    [provider]  furosemide  (LASIX ) 20 MG tablet TAKE 1 TABLET(20 MG) BY MOUTH DAILY 08/21/23   Baxley, Ronal PARAS, MD  Hypromellose, PF, (RETAINE [facility]) 0.3 % SOLN Place 1 drop into both eyes 3 (three) times daily as needed (for dryness).    [provider]  Magnesium  250 MG TABS Take 250 mg by mouth at bedtime.    [provider]  metoprolol  succinate (TOPROL -XL) 25 MG 24 hr tablet TAKE 1 TABLET(25 MG) BY MOUTH DAILY 10/14/24   Perri Ronal PARAS, MD  metroNIDAZOLE  (METROGEL) 1 % gel Apply 1 Application topically as needed. 06/18/23   [provider]  Multiple Vitamin (MULTIVITAMIN) tablet Take 1 tablet by mouth daily.    [provider]  mupirocin  ointment (BACTROBAN ) 2 % Apply 1 Application topically 2 (two) times daily. 09/10/23   Perri Ronal PARAS, MD  potassium chloride  SA (KLOR-CON  M) 20 MEQ tablet Take 1 tablet (20 mEq total) by mouth daily. 07/14/24   Perri Ronal PARAS, MD  PREVIDENT 5000 BOOSTER PLUS 1.1 % PSTE USE TO BRUSH TEETH AFTER MEALS AND BEFORE BEDTIME 05/29/23   [provider]  Saline 0.9 % AERS Place 1 spray into both nostrils as needed (congestion).    [provider]  Vitamin D , Ergocalciferol , (DRISDOL ) 1.25 MG (50000 UNIT) CAPS capsule TAKE 1 CAPSULE BY MOUTH EVERY 7 DAYS 10/08/23   Baxley, Ronal PARAS, MD    Allergies: Patient has no known allergies.    Review of Systems  Psychiatric/Behavioral:  Positive for confusion.   All other systems reviewed and are negative.   Updated Vital Signs BP (!) 144/78 (BP Location: Right Arm)   Pulse (!) 109   Temp 98.7 F (37.1 C) (Oral)   Resp 20   SpO2 97%   Physical Exam Vitals and nursing note reviewed.  Constitutional:      Comments: Confused and altered  HENT:  Head: Normocephalic.     Right Ear: Tympanic membrane normal.     Left Ear: Tympanic membrane normal.     Nose: Nose normal.     Mouth/Throat:     Comments: Right lower canine with root canal and no obvious periapical abscess.  No signs of Ludwig angina Eyes:     Extraocular Movements: Extraocular movements intact.     Pupils: Pupils are equal, round, and reactive to light.  Cardiovascular:     Rate and Rhythm: Normal rate and regular rhythm.     Pulses: Normal pulses.     Heart sounds: Normal heart sounds.  Pulmonary:     Effort: Pulmonary effort is normal.     Comments: Diminished bilateral bases Abdominal:     General: Abdomen is flat.     Palpations: Abdomen is soft.   Musculoskeletal:        General: Normal range of motion.     Cervical back: Normal range of motion and neck supple.  Skin:    General: Skin is warm.     Capillary Refill: Capillary refill takes less than 2 seconds.  Neurological:     General: No focal deficit present.     Mental Status: She is alert and oriented to person, place, and time.  Psychiatric:        Mood and Affect: Mood normal.        Behavior: Behavior normal.     (all labs ordered are listed, but only abnormal results are displayed) Labs Reviewed  I-STAT CG4 LACTIC ACID, ED - Abnormal; Notable for the following components:      Result Value   Lactic Acid, Venous 2.9 (*)    All other components within normal limits  CULTURE, BLOOD (ROUTINE X 2)  CULTURE, BLOOD (ROUTINE X 2)  RESP PANEL BY RT-PCR (RSV, FLU A&B, COVID)  RVPGX2  CBC WITH DIFFERENTIAL/PLATELET  COMPREHENSIVE METABOLIC PANEL WITH GFR    EKG: EKG Interpretation Date/Time:  Thursday November 06 2024 18:35:40 EST Ventricular Rate:  107 PR Interval:  153 QRS Duration:  117 QT Interval:  339 QTC Calculation: 453 R Axis:   47  Text Interpretation: Sinus tachycardia Multiple ventricular premature complexes Nonspecific intraventricular conduction delay Anteroseptal infarct, age indeterminate No significant change since last tracing Confirmed by Patt Alm DEL [phone]) on 11/06/2024 6:38:31 PM  Radiology: No results found.   Procedures   CRITICAL CARE Performed by: Alm DEL Patt   Total critical care time: 40 minutes  Critical care time was exclusive of separately billable procedures and treating other patients.  Critical care was necessary to treat or prevent imminent or life-threatening deterioration.  Critical care was time spent personally by me on the following activities: development of treatment plan with patient and/or surrogate as well as nursing, discussions with consultants, evaluation of patient's response to treatment, examination of  patient, obtaining history from patient or surrogate, ordering and performing treatments and interventions, ordering and review of laboratory studies, ordering and review of radiographic studies, pulse oximetry and re-evaluation of patient's condition.   Medications Ordered in the ED  cefTRIAXone  (ROCEPHIN ) 1 g in sodium chloride  0.9 % 100 mL IVPB (has no administration in time range)  azithromycin (ZITHROMAX) 500 mg in sodium chloride  0.9 % 250 mL IVPB (has no administration in time range)  sodium chloride  0.9 % bolus 1,000 mL (1,000 mLs Intravenous New Bag/Given 11/06/24 1837)  Medical Decision Making TORUNN CHANCELLOR is a 73 y.o. female here presenting with confusion and cough.  Patient is hypotensive and tachycardic.  Concern for sepsis from likely pneumonia.  Patient has no signs of Ludwig angina or deep space neck infection from recent dental procedure.  Plan to get CBC and CMP and lactate and cultures and chest x-ray.  Will give empiric antibiotics covering for pneumonia.  9:31 PM Reviewed labs and white blood cell count is normal.  Lactate is elevated at 2.9.  Chest x-ray did not show any obvious infiltrate but CT chest showed right pleural effusion with pneumonia.  Patient also has bilateral perinephric fat stranding concern for possible pyelonephritis.  Patient was given Rocephin  and azithromycin.  Hospitalist to admit for sepsis from pneumonia, pyelonephritis  Problems Addressed: Community acquired pneumonia of right lower lobe of lung: acute illness or injury Pyelonephritis: acute illness or injury Sepsis, due to unspecified organism, unspecified whether acute organ dysfunction present [facility]): acute illness or injury  Amount and/or Complexity of Data Reviewed Labs: ordered. Decision-making details documented in ED Course. Radiology: ordered and independent interpretation performed. Decision-making details documented in ED  Course.  Risk Prescription drug management.     Final diagnoses:  None    ED Discharge Orders     None          Patt Alm Macho, MD 11/06/24 2132

## 2024-11-07 ENCOUNTER — Telehealth: Payer: Self-pay | Admitting: Gastroenterology

## 2024-11-07 ENCOUNTER — Encounter (HOSPITAL_COMMUNITY): Payer: Self-pay | Admitting: Internal Medicine

## 2024-11-07 DIAGNOSIS — Z1152 Encounter for screening for COVID-19: Secondary | ICD-10-CM | POA: Diagnosis not present

## 2024-11-07 DIAGNOSIS — I129 Hypertensive chronic kidney disease with stage 1 through stage 4 chronic kidney disease, or unspecified chronic kidney disease: Secondary | ICD-10-CM | POA: Diagnosis present

## 2024-11-07 DIAGNOSIS — G9341 Metabolic encephalopathy: Secondary | ICD-10-CM | POA: Diagnosis present

## 2024-11-07 DIAGNOSIS — R404 Transient alteration of awareness: Secondary | ICD-10-CM | POA: Diagnosis not present

## 2024-11-07 DIAGNOSIS — R4182 Altered mental status, unspecified: Secondary | ICD-10-CM | POA: Diagnosis present

## 2024-11-07 DIAGNOSIS — Z96641 Presence of right artificial hip joint: Secondary | ICD-10-CM | POA: Diagnosis present

## 2024-11-07 DIAGNOSIS — N10 Acute tubulo-interstitial nephritis: Secondary | ICD-10-CM | POA: Diagnosis not present

## 2024-11-07 DIAGNOSIS — F419 Anxiety disorder, unspecified: Secondary | ICD-10-CM | POA: Diagnosis present

## 2024-11-07 DIAGNOSIS — I700 Atherosclerosis of aorta: Secondary | ICD-10-CM | POA: Diagnosis present

## 2024-11-07 DIAGNOSIS — Z8249 Family history of ischemic heart disease and other diseases of the circulatory system: Secondary | ICD-10-CM | POA: Diagnosis not present

## 2024-11-07 DIAGNOSIS — F1010 Alcohol abuse, uncomplicated: Secondary | ICD-10-CM | POA: Diagnosis present

## 2024-11-07 DIAGNOSIS — E872 Acidosis, unspecified: Secondary | ICD-10-CM | POA: Diagnosis present

## 2024-11-07 DIAGNOSIS — E861 Hypovolemia: Secondary | ICD-10-CM | POA: Diagnosis present

## 2024-11-07 DIAGNOSIS — J189 Pneumonia, unspecified organism: Secondary | ICD-10-CM

## 2024-11-07 DIAGNOSIS — Z79899 Other long term (current) drug therapy: Secondary | ICD-10-CM | POA: Diagnosis not present

## 2024-11-07 DIAGNOSIS — Z7982 Long term (current) use of aspirin: Secondary | ICD-10-CM | POA: Diagnosis not present

## 2024-11-07 DIAGNOSIS — N12 Tubulo-interstitial nephritis, not specified as acute or chronic: Secondary | ICD-10-CM | POA: Diagnosis present

## 2024-11-07 DIAGNOSIS — K7031 Alcoholic cirrhosis of liver with ascites: Secondary | ICD-10-CM | POA: Diagnosis present

## 2024-11-07 DIAGNOSIS — E785 Hyperlipidemia, unspecified: Secondary | ICD-10-CM | POA: Diagnosis present

## 2024-11-07 DIAGNOSIS — E039 Hypothyroidism, unspecified: Secondary | ICD-10-CM | POA: Diagnosis present

## 2024-11-07 DIAGNOSIS — K861 Other chronic pancreatitis: Secondary | ICD-10-CM | POA: Diagnosis present

## 2024-11-07 DIAGNOSIS — E876 Hypokalemia: Secondary | ICD-10-CM | POA: Diagnosis present

## 2024-11-07 DIAGNOSIS — I959 Hypotension, unspecified: Secondary | ICD-10-CM | POA: Diagnosis present

## 2024-11-07 DIAGNOSIS — Z87891 Personal history of nicotine dependence: Secondary | ICD-10-CM | POA: Diagnosis not present

## 2024-11-07 DIAGNOSIS — N1832 Chronic kidney disease, stage 3b: Secondary | ICD-10-CM | POA: Diagnosis present

## 2024-11-07 DIAGNOSIS — K766 Portal hypertension: Secondary | ICD-10-CM | POA: Diagnosis present

## 2024-11-07 DIAGNOSIS — E871 Hypo-osmolality and hyponatremia: Secondary | ICD-10-CM | POA: Diagnosis present

## 2024-11-07 LAB — COMPREHENSIVE METABOLIC PANEL WITH GFR
ALT: 22 U/L (ref 0–44)
AST: 70 U/L — ABNORMAL HIGH (ref 15–41)
Albumin: 2.6 g/dL — ABNORMAL LOW (ref 3.5–5.0)
Alkaline Phosphatase: 206 U/L — ABNORMAL HIGH (ref 38–126)
Anion gap: 13 (ref 5–15)
BUN: 22 mg/dL (ref 8–23)
CO2: 16 mmol/L — ABNORMAL LOW (ref 22–32)
Calcium: 8.2 mg/dL — ABNORMAL LOW (ref 8.9–10.3)
Chloride: 105 mmol/L (ref 98–111)
Creatinine, Ser: 1.13 mg/dL — ABNORMAL HIGH (ref 0.44–1.00)
GFR, Estimated: 51 mL/min — ABNORMAL LOW (ref >60)
Glucose, Bld: 112 mg/dL — ABNORMAL HIGH (ref 70–99)
Potassium: 3.1 mmol/L — ABNORMAL LOW (ref 3.5–5.1)
Sodium: 133 mmol/L — ABNORMAL LOW (ref 135–145)
Total Bilirubin: 1.8 mg/dL — ABNORMAL HIGH (ref 0.0–1.2)
Total Protein: 6.1 g/dL — ABNORMAL LOW (ref 6.5–8.1)

## 2024-11-07 LAB — CBC WITH DIFFERENTIAL/PLATELET
Abs Immature Granulocytes: 0.04 K/uL (ref 0.00–0.07)
Basophils Absolute: 0 K/uL (ref 0.0–0.1)
Basophils Relative: 0 %
Eosinophils Absolute: 0 K/uL (ref 0.0–0.5)
Eosinophils Relative: 0 %
HCT: 32.8 % — ABNORMAL LOW (ref 36.0–46.0)
Hemoglobin: 11.9 g/dL — ABNORMAL LOW (ref 12.0–15.0)
Immature Granulocytes: 1 %
Lymphocytes Relative: 10 %
Lymphs Abs: 0.9 K/uL (ref 0.7–4.0)
MCH: 35 pg — ABNORMAL HIGH (ref 26.0–34.0)
MCHC: 36.3 g/dL — ABNORMAL HIGH (ref 30.0–36.0)
MCV: 96.5 fL (ref 80.0–100.0)
Monocytes Absolute: 0.8 K/uL (ref 0.1–1.0)
Monocytes Relative: 10 %
Neutro Abs: 6.7 K/uL (ref 1.7–7.7)
Neutrophils Relative %: 79 %
Platelets: 70 K/uL — ABNORMAL LOW (ref 150–400)
RBC: 3.4 MIL/uL — ABNORMAL LOW (ref 3.87–5.11)
RDW: 13.2 % (ref 11.5–15.5)
WBC: 8.5 K/uL (ref 4.0–10.5)
nRBC: 0 % (ref 0.0–0.2)

## 2024-11-07 LAB — MAGNESIUM: Magnesium: 1.7 mg/dL (ref 1.7–2.4)

## 2024-11-07 LAB — PHOSPHORUS: Phosphorus: 2.4 mg/dL — ABNORMAL LOW (ref 2.5–4.6)

## 2024-11-07 MED ORDER — IPRATROPIUM-ALBUTEROL 0.5-2.5 (3) MG/3ML IN SOLN
3.0000 mL | Freq: Four times a day (QID) | RESPIRATORY_TRACT | Status: DC
  Administered 2024-11-07 (×3): 3 mL via RESPIRATORY_TRACT
  Filled 2024-11-07 (×3): qty 3

## 2024-11-07 MED ORDER — POTASSIUM CHLORIDE 20 MEQ PO PACK
60.0000 meq | PACK | Freq: Once | ORAL | Status: AC
  Administered 2024-11-07: 60 meq via ORAL
  Filled 2024-11-07: qty 3

## 2024-11-07 MED ORDER — IPRATROPIUM-ALBUTEROL 0.5-2.5 (3) MG/3ML IN SOLN
3.0000 mL | Freq: Four times a day (QID) | RESPIRATORY_TRACT | Status: DC | PRN
  Administered 2024-11-07 – 2024-11-08 (×2): 3 mL via RESPIRATORY_TRACT
  Filled 2024-11-07 (×2): qty 3

## 2024-11-07 MED ORDER — GUAIFENESIN-DM 100-10 MG/5ML PO SYRP
5.0000 mL | ORAL_SOLUTION | ORAL | Status: DC | PRN
  Administered 2024-11-07 – 2024-11-08 (×5): 5 mL via ORAL
  Filled 2024-11-07 (×5): qty 10

## 2024-11-07 MED ORDER — SODIUM CHLORIDE 0.9 % IV SOLN
500.0000 mg | INTRAVENOUS | Status: DC
  Administered 2024-11-07: 500 mg via INTRAVENOUS
  Filled 2024-11-07 (×2): qty 5

## 2024-11-07 MED ORDER — ALPRAZOLAM 0.5 MG PO TABS
0.5000 mg | ORAL_TABLET | Freq: Two times a day (BID) | ORAL | Status: DC | PRN
  Administered 2024-11-07 (×2): 0.5 mg via ORAL
  Filled 2024-11-07 (×2): qty 1

## 2024-11-07 MED ORDER — MAGNESIUM SULFATE 2 GM/50ML IV SOLN
2.0000 g | Freq: Once | INTRAVENOUS | Status: AC
  Administered 2024-11-07: 2 g via INTRAVENOUS
  Filled 2024-11-07: qty 50

## 2024-11-07 MED ADMIN — CEFTRIAXONE 2 G/100 ML IVPB MIXTURE: 2 g | INTRAVENOUS | NDC 00781320990

## 2024-11-07 MED ADMIN — Sennosides-Docusate Sodium Tab 8.6-50 MG: 2 | ORAL | NDC 60687062211

## 2024-11-07 MED FILL — Sennosides-Docusate Sodium Tab 8.6-50 MG: 2.0000 | ORAL | Qty: 2 | Status: AC

## 2024-11-07 MED FILL — Ceftriaxone Sodium For Inj 2 GM: 2.0000 g | INTRAMUSCULAR | Qty: 20 | Status: AC

## 2024-11-07 NOTE — Progress Notes (Addendum)
 PROGRESS NOTE    LATYSHA THACKSTON  FMW:[id number] DOB: 02/23/1951 DOA: 11/06/2024 PCP: Perri Ronal PARAS, MD    Brief Narrative:   Jeanne Smith is a 73 y.o. female with past medical history significant for HTN, HLD, CKD stage IIIb, chronic back pain, spinal stenosis, hypothyroidism, proximal rectal adenocarcinoma s/p resection, EtOH cirrhosis who presented to [facility] ED on 11/06/2024 via EMS for confusion.  Patient endorses feeling fatigued with mild cough and chills for the last few days.  In the ED, temperature 98.7 F, HR 109, RR 20, BP 112/70, SpO2 97% on room air.  WBC 10.2, hemoglobin 12.9, platelet count 74.  Sodium 132, potassium 3.0, chloride 100, CO2 18,  glucose 138, BUN 24, creat 1.25.  AST 77, ALT 25, total bilirubin 2.1.  Lactic acid 2.9.  Influenza/RSV/COVID PCR negative.  Urinalysis with large leukocytes, negative nitrite, few bacteria, greater than 50 WBCs.  CT head without contrast with no evidence of acute intracranial abnormality.  CT chest/abdomen/pelvis with small right pleural effusion, mild parabronchial ground glass disease central right upper lobe concerning for pneumonia, slight increased bilateral perinephric fat stranding with cortical hypoenhancement left kidney suggestive of pyelonephritis, small nonobstructing left kidney stone, liver cirrhosis with evidence of portal hypertension, small volume perihepatic ascites, distended gallbladder with mild diffuse gallbladder wall thickening with no calcified gallstone that is nonspecific, chronic pancreatitis with diffuse ductal dilation no acute inflammation, large stool burden, aortic atherosclerosis.  ED bili ordered 1 L NS bolus, started on azithromycin and ceftriaxone .  TRH consulted for admission for further evaluation and management of pneumonia, pyelonephritis.  Assessment & Plan:   Community acquired pneumonia CT chest with mild ground glass disease central right upper lobe consistent with  pneumonia. --Blood cultures x 2: Pending -- Azithromycin 500 mg IV every 24 hours -- Ceftriaxone  2 g IV every 24 hours -- Robitussin every 4 hours as needed cough -- DuoNebs every 6 hours as needed wheezing/shortness of breath -- On room air.  Pyelonephritis CT abdomen/pelvis with bilateral perinephric fat stranding with cortical hypoenhancement left kidney consistent with pyelonephritis. -- Urine culture: Pending -- Blood cultures x 2: Pending -- Ceftriaxone  2 g IV every 24 hours -- NS with 40 meQ KCL at 100 mL/h  Hyponatremia Sodium 133, likely secondary to mild volume overload in the setting of cirrhosis/ascites versus hypovolemic hyponatremia in the setting of poor oral intake in the days preceding hospitalization. -- Continue IV fluid with NS at 100 mL/h -- CMP in am  Hypokalemia Potassium 3.1, will replete.  Repeat electrolytes in a.m.  Alcohol  cirrhosis with ascites Portal hypertension Thrombocytopenia The patient follows with [facility], Dr. Charlanne outpatient.  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  Continue outpatient follow-up with gastroenterology.     Latest Ref Rng & Units 11/07/2024    1:11 AM 11/06/2024    6:27 PM 10/08/2024   11:43 AM  Hepatic Function  Total Protein 6.5 - 8.1 g/dL 6.1  6.9  7.6   Albumin 3.5 - 5.0 g/dL 2.6  3.0  3.5   AST 15 - 41 U/L 70  77  100   ALT 0 - 44 U/L 22  25  33   Alk Phosphatase 38 - 126 U/L 206  214  206   Total Bilirubin 0.0 - 1.2 mg/dL 1.8  2.1  1.9      HTN At baseline on metoprolol  succinate 25 mg p.o. daily, amlodipine  5 mg p.o. daily, furosemide  20 mg 4 times weekly --  Borderline hypotensive on admission -- Continue to hold home) for now -- Monitor BP closely  HLD Not on lipid-lowering therapy outpatient.  CKD stage IIIb, stable  Lab Results  Component Value Date   CREATININE 1.13 (H) 11/07/2024   CREATININE 1.25 (H) 11/06/2024   CREATININE 1.01 10/08/2024      Hypothyroidism Currently not on thyroid  medicine outpatient.  Anxiety Alprazolam  0.5 mg 2 times daily as needed anxiety.  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  Pending upcoming EGD; now planned for January.   DVT prophylaxis: enoxaparin  (LOVENOX ) injection 40 mg Start: 11/07/24 1000    Code Status: Full Code Family Communication:   Disposition Plan:  Level of care: Telemetry Status is: Inpatient Remains inpatient appropriate because: IV antibiotics    Consultants:  None  Procedures:  None  Antimicrobials:  Azithromycin 12/11>> Ceftriaxone  12/11>>   Subjective: Patient seen examined bedside, lying in bed.  Confusion improved.  Weakness/fatigue also improved.  Remains on IV antibiotics.  Awaiting urine/blood culture results.  No other questions or concerns at this time.  Denies headache, no dizziness, no chest pain, no palpitations, no shortness of breath, no abdominal pain, no fever/chills/night sweats, no nausea cefonicid diarrhea, no focal weakness, no fatigue, no paresthesias.  No acute events overnight per nursing.  Objective: Vitals:   11/07/24 0728 11/07/24 0807 11/07/24 1256 11/07/24 1427  BP: 109/69  (!) 109/58   Pulse: 93  89   Resp: 20  16   Temp: 98.9 F (37.2 C)  98.9 F (37.2 C)   TempSrc: Oral  Oral   SpO2: 95% 95% 99% 97%  Weight:      Height:        Intake/Output Summary (Last 24 hours) at 11/07/2024 1438 Last data filed at 11/07/2024 1242 Gross per 24 hour  Intake 601.43 ml  Output --  Net 601.43 ml   Filed Weights   11/07/24 0329  Weight: 57.6 kg    Examination:  Physical Exam: GEN: NAD, alert and oriented x 3, wd/wn HEENT: NCAT, PERRL, EOMI, sclera clear, MMM PULM: CTAB w/o wheezes/crackles, normal respiratory effort, on room air CV: RRR w/o M/G/R GI: abd soft, NTND, + BS MSK: no peripheral edema,  moves all extremities independently with preserved muscle strength NEURO: CN II-XII intact, no focal deficits, sensation to light touch intact PSYCH: normal mood/affect Integumentary: dry/intact, no rashes or wounds    Data Reviewed: I have personally reviewed following labs and imaging studies  CBC: Recent Labs  Lab 11/06/24 1827 11/07/24 0111  WBC 10.2 8.5  NEUTROABS 8.8* 6.7  HGB 12.9 11.9*  HCT 36.5 32.8*  MCV 97.3 96.5  PLT 74* 70*   Basic Metabolic Panel: Recent Labs  Lab 11/06/24 1827 11/07/24 0111  NA 132* 133*  K 3.0* 3.1*  CL 100 105  CO2 18* 16*  GLUCOSE 138* 112*  BUN 24* 22  CREATININE 1.25* 1.13*  CALCIUM  8.8* 8.2*  MG  --  1.7  PHOS  --  2.4*   GFR: Estimated Creatinine Clearance: 38.3 mL/min (A) (by C-G formula based on SCr of 1.13 mg/dL (H)). Liver Function Tests: Recent Labs  Lab 11/06/24 1827 11/07/24 0111  AST 77* 70*  ALT 25 22  ALKPHOS 214* 206*  BILITOT 2.1* 1.8*  PROT 6.9 6.1*  ALBUMIN 3.0* 2.6*   No results for input(s): LIPASE, AMYLASE in the last 168 hours. No results for input(s): AMMONIA in the last  168 hours. Coagulation Profile: No results for input(s): INR, PROTIME in the last 168 hours. Cardiac Enzymes: No results for input(s): CKTOTAL, CKMB, CKMBINDEX, TROPONINI in the last 168 hours. BNP (last 3 results) No results for input(s): PROBNP in the last 8760 hours. HbA1C: No results for input(s): HGBA1C in the last 72 hours. CBG: No results for input(s): GLUCAP in the last 168 hours. Lipid Profile: No results for input(s): CHOL, HDL, LDLCALC, TRIG, CHOLHDL, LDLDIRECT in the last 72 hours. Thyroid  Function Tests: No results for input(s): TSH, T4TOTAL, FREET4, T3FREE, THYROIDAB in the last 72 hours. Anemia Panel: No results for input(s): VITAMINB12, FOLATE, FERRITIN, TIBC, IRON, RETICCTPCT in the last 72 hours. Sepsis Labs: Recent Labs  Lab 11/06/24 1827  11/06/24 2120  LATICACIDVEN 2.9* 1.8    Recent Results (from the past 240 hours)  Blood culture (routine x 2)     Status: None (Preliminary result)   Collection Time: 11/06/24  1:11 AM   Specimen: BLOOD  Result Value Ref Range Status   Specimen Description   Final    BLOOD RIGHT ANTECUBITAL Performed at [facility], [address] [place], [place] [address]    Special Requests   Final    BOTTLES DRAWN AEROBIC AND ANAEROBIC Blood Culture adequate volume Performed at [facility], [address] [place], [place] [address]    Culture   Final    NO GROWTH < 12 HOURS Performed at [facility], [address], [place], [place] [address]    Report Status PENDING  Incomplete  Blood culture (routine x 2)     Status: None (Preliminary result)   Collection Time: 11/06/24  6:10 PM   Specimen: BLOOD  Result Value Ref Range Status   Specimen Description   Final    BLOOD BLOOD RIGHT ARM Performed at [facility], [address] [place], [place] [address]    Special Requests   Final    BOTTLES DRAWN AEROBIC AND ANAEROBIC Blood Culture adequate volume Performed at [facility], [address], [place], [place] [address]    Culture   Final    NO GROWTH < 12 HOURS Performed at [facility], [address] [place], [place] [address]    Report Status PENDING  Incomplete  Resp panel by RT-PCR (RSV, Flu A&B, Covid) Anterior Nasal Swab     Status: None   Collection Time: 11/06/24  7:02 PM   Specimen: Anterior Nasal Swab  Result Value Ref Range Status   SARS Coronavirus 2 by RT PCR NEGATIVE NEGATIVE Final    Comment: (NOTE) SARS-CoV-2 target nucleic acids are NOT DETECTED.  The SARS-CoV-2 RNA is generally detectable in upper respiratory specimens during the acute phase of infection. The lowest concentration of SARS-CoV-2 viral copies this assay can detect is 138 copies/mL. A negative result does not  preclude SARS-Cov-2 infection and should not be used as the sole basis for treatment or other patient management decisions. A negative result may occur with  improper specimen collection/handling, submission of specimen other than nasopharyngeal swab, presence of viral mutation(s) within the areas targeted by this assay, and inadequate number of viral copies(<138 copies/mL). A negative result must be combined with clinical observations, patient history, and epidemiological information. The expected result is Negative.  Fact Sheet for Patients:  bloggercourse.com  Fact Sheet for Healthcare Providers:  seriousbroker.it  This test is no t yet approved or cleared by the [place]  FDA and  has been authorized for detection and/or diagnosis of SARS-CoV-2 by FDA under an Emergency Use Authorization (EUA). This EUA will remain  in effect (meaning this test can be used) for the duration of the COVID-19 declaration under Section 564(b)(1) of the Act, 21 U.S.C.section 360bbb-3(b)(1), unless the authorization is terminated  or revoked sooner.       Influenza A by PCR NEGATIVE NEGATIVE Final   Influenza B by PCR NEGATIVE NEGATIVE Final    Comment: (NOTE) The Xpert Xpress SARS-CoV-2/FLU/RSV plus assay is intended as an aid in the diagnosis of influenza from Nasopharyngeal swab specimens and should not be used as a sole basis for treatment. Nasal washings and aspirates are unacceptable for Xpert Xpress SARS-CoV-2/FLU/RSV testing.  Fact Sheet for Patients: bloggercourse.com  Fact Sheet for Healthcare Providers: seriousbroker.it  This test is not yet approved or cleared by the [place]  FDA and has been authorized for detection and/or diagnosis of SARS-CoV-2 by FDA under an Emergency Use Authorization (EUA). This EUA will remain in effect (meaning this test can be used) for the  duration of the COVID-19 declaration under Section 564(b)(1) of the Act, 21 U.S.C. section 360bbb-3(b)(1), unless the authorization is terminated or revoked.     Resp Syncytial Virus by PCR NEGATIVE NEGATIVE Final    Comment: (NOTE) Fact Sheet for Patients: bloggercourse.com  Fact Sheet for Healthcare Providers: seriousbroker.it  This test is not yet approved or cleared by the [place]  FDA and has been authorized for detection and/or diagnosis of SARS-CoV-2 by FDA under an Emergency Use Authorization (EUA). This EUA will remain in effect (meaning this test can be used) for the duration of the COVID-19 declaration under Section 564(b)(1) of the Act, 21 U.S.C. section 360bbb-3(b)(1), unless the authorization is terminated or revoked.  Performed at [facility], [address], [place], [place] [address]          Radiology Studies: CT HEAD WO CONTRAST ( ) Result Date: 11/06/2024 CLINICAL DATA:  Altered mental status EXAM: CT HEAD WITHOUT CONTRAST TECHNIQUE: Contiguous axial images were obtained from the base of the skull through the vertex without intravenous contrast. RADIATION DOSE REDUCTION: This exam was performed according to the departmental dose-optimization program which includes automated exposure control, adjustment of the mA and/or kV according to patient size and/or use of iterative reconstruction technique. COMPARISON:  None Available. FINDINGS: Brain: No acute territorial infarction, hemorrhage or intracranial mass. Mild atrophy. Non enlarged ventricles Vascular: No hyperdense vessels.  Carotid vascular calcification Skull: Normal. Negative for fracture or focal lesion. Sinuses/Orbits: Mucosal thickening in the sinuses Other: None IMPRESSION: 1. No CT evidence for acute intracranial abnormality. Electronically Signed   By: Luke Bun M.D.   On: 11/06/2024 20:40   CT CHEST ABDOMEN PELVIS W  CONTRAST Result Date: 11/06/2024 CLINICAL DATA:  Sepsis recent dental procedure EXAM: CT CHEST, ABDOMEN, AND PELVIS WITH CONTRAST TECHNIQUE: Multidetector CT imaging of the chest, abdomen and pelvis was performed following the standard protocol during bolus administration of intravenous contrast. RADIATION DOSE REDUCTION: This exam was performed according to the departmental dose-optimization program which includes automated exposure control, adjustment of the mA and/or kV according to patient size and/or use of iterative reconstruction technique. CONTRAST:  80mL OMNIPAQUE  IOHEXOL  300 MG/ML  SOLN COMPARISON:  Chest x-ray 11/06/2024, CT 07/08/2024 FINDINGS: CT CHEST FINDINGS Cardiovascular: Advanced aortic atherosclerosis. No aneurysm. Multi vessel coronary vascular calcification. Normal cardiac size. No pericardial effusion Mediastinum/Nodes: Patent trachea. No thyroid  mass. Subcentimeter mediastinal lymph nodes. Esophagus within normal limits. Lungs/Pleura:  Small right-sided pleural effusion. Mild peribronchovascular heterogeneous ground-glass disease in the central right upper lobe, series 7, image 44 through 53. Otherwise no focal airspace disease is seen. Musculoskeletal: Sternum appears intact. Multilevel degenerative changes. No acute osseous abnormality. Calcified bilateral breast implants. Appearance of right implant suspect for rupture. CT ABDOMEN PELVIS FINDINGS Hepatobiliary: Liver cirrhosis. No calcified gallstone. Mild diffuse gallbladder wall thickening. Gallbladder is distended. Pancreas: Atrophic. Numerous calcifications consistent with chronic pancreatitis. Diffuse ductal dilatation. Spleen: Appears slightly enlarged, oblique craniocaudal measurement of 13.5 cm. Adrenals/Urinary Tract: Adrenal glands are normal. Kidneys show no hydronephrosis. Increased bilateral perinephric fat stranding compared to prior. Heterogenous cortical hypoenhancement left kidney best seen on delayed images, for example  lower pole series 12, image 31. Bladder obscured by artifact from hip prosthesis. Small left kidney stone Stomach/Bowel: Stomach within normal limits. No dilated small bowel. No acute bowel wall thickening. Large stool burden Vascular/Lymphatic: Advanced aortic atherosclerosis. No aneurysm. Multiple subcentimeter retroperitoneal lymph nodes. Large upper abdominal varices. Reproductive: Obscured by artifact from hip prosthesis Other: No free air.  Small volume perihepatic ascites. Musculoskeletal: Bilateral hip replacements. No acute osseous abnormality. IMPRESSION: 1. Small right-sided pleural effusion. Mild peribronchovascular heterogeneous ground-glass disease in the central right upper lobe, possible pneumonia. 2. Slight increased bilateral perinephric fat stranding. Cortical hypoenhancement left kidney best seen on delayed views, appearance is suggestive of pyelonephritis. Suggest correlation with urinalysis. Small nonobstructing left kidney stone. 3. Liver cirrhosis with evidence of portal hypertension. Small volume perihepatic ascites. 4. Distended gallbladder with mild diffuse gallbladder wall thickening. No calcified gallstone. Gallbladder wall thickening is nonspecific in the setting of liver disease. Ultrasound follow-up if deemed appropriate. 5. Chronic pancreatitis with diffuse ductal dilatation. No acute inflammation. 6. Large stool burden. 7. Aortic atherosclerosis. Aortic Atherosclerosis (ICD10-I70.0). Electronically Signed   By: Luke Bun M.D.   On: 11/06/2024 20:38   DG Chest 2 View Result Date: 11/06/2024 CLINICAL DATA:  Cough, fever EXAM: CHEST - 2 VIEW COMPARISON:  11/15/2023 FINDINGS: Frontal and lateral views of the chest are obtained, evaluation limited by overlying artifact. Cardiac silhouette is unremarkable. No airspace disease, effusion, or pneumothorax. No acute bony abnormalities. IMPRESSION: 1. Limited study due to overlying artifact. No acute intrathoracic process.  Electronically Signed   By: Ozell Daring M.D.   On: 11/06/2024 19:04        Scheduled Meds:  enoxaparin  (LOVENOX ) injection  40 mg Subcutaneous Q24H   ipratropium-albuterol   3 mL Nebulization Q6H   senna-docusate  2 tablet Oral QHS   Continuous Infusions:  0.9 % NaCl with KCl 40 mEq / L 100 mL/hr at 11/07/24 0047   azithromycin     cefTRIAXone  (ROCEPHIN )  IV 2 g (11/07/24 1354)   magnesium  sulfate bolus IVPB 2 g (11/07/24 1350)     LOS: 0 days    Time spent: 52 minutes spent on 11/07/2024 caring for this patient face-to-face including chart review, ordering labs/tests, documenting, discussion with nursing staff, consultants, updating family and interview/physical exam    Camellia PARAS Jenine Krisher, DO [facility] Available via Epic secure chat 7am-7pm After these hours, please refer to coverage provider listed on [email or web address] 11/07/2024, 2:38 PM

## 2024-11-07 NOTE — TOC Initial Note (Signed)
 Transition of Care [facility]) - Initial/Assessment Note    Patient Details  Name: Jeanne Smith MRN: [id number] Date of Birth: 08/17/1951  [facility]) CM/SW Contact:    Bascom Service, RN Phone Number: 11/07/2024, 10:40 AM  Clinical Narrative: AMS. Await return call from Caroline(dtr) or s/o Clifford for d/c plans.                  Expected Discharge Plan: Home/Self Care Barriers to Discharge: Continued Medical Work up   Patient Goals and CMS Choice Patient states their goals for this hospitalization and ongoing recovery are:: Home CMS Medicare.gov Compare Post Acute Care list provided to:: Patient Represenative (must comment) (LVM Caroline(dtr)) Choice offered to / list presented to : Adult Children  ownership interest in [facility].provided to:: Adult Children    Expected Discharge Plan and Services   Discharge Planning Services: CM Consult Post Acute Care Choice: Resumption of Svcs/PTA Provider Living arrangements for the past 2 months: Single Family Home                                      Prior Living Arrangements/Services Living arrangements for the past 2 months: Single Family Home Lives with:: Spouse                   Activities of Daily Living   ADL Screening (condition at time of admission) Independently performs ADLs?: Yes (appropriate for developmental age) Is the patient deaf or have difficulty hearing?: No Does the patient have difficulty seeing, even when wearing glasses/contacts?: No Does the patient have difficulty concentrating, remembering, or making decisions?: Yes  Permission Sought/Granted                  Emotional Assessment              Admission diagnosis:  Pyelonephritis [N12] Community acquired pneumonia of right lower lobe of lung [J18.9] AMS (altered mental status) [R41.82] Sepsis, due to unspecified organism, unspecified whether acute organ dysfunction present [facility])  [A41.9] Patient Active Problem List   Diagnosis Date Noted   AMS (altered mental status) 11/06/2024   Colon cancer (HCC) 07/05/2022   Colonic mass    Adenomatous polyp of transverse colon    Closed Galeazzi's fracture of left radius 12/21/2020   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   Retained orthopedic hardware 06/15/2020   Stenosing tenosynovitis of wrist 04/01/2019   Closed fracture of right wrist 01/29/2019   Pain in right wrist 01/21/2019   Pain in right hand 01/21/2019   S/P left THA, AA 05/16/2018   S/P hip replacement 05/16/2018   Knee pain 03/28/2018   Pure hypercholesterolemia 12/24/2017   Intertrochanteric fracture of right femur (HCC) 06/15/2015   Comminuted right humeral fracture 06/15/2015   HTN (hypertension) 06/15/2015   PCP:  Perri Ronal PARAS, MD Pharmacy:   [id number] - [place], [place] - [address] AT [facility] [address] [place] [address] Phone: [phone] Fax: [phone]  [facility] [address] [place] [address] Phone: [phone] Fax: [phone]     Social Drivers of Health (SDOH) Social History: SDOH Screenings   Food Insecurity: No Food Insecurity (11/06/2024)  Housing: Low Risk (11/06/2024)  Transportation Needs: No Transportation Needs (11/06/2024)  Utilities: Not At Risk (11/06/2024)  Alcohol   Screen: Low Risk (08/22/2024)  Depression (PHQ2-9): Low Risk (07/14/2024)  Financial Resource Strain: Patient Declined (08/22/2024)  Physical Activity: Insufficiently Active (08/22/2024)  Social Connections: Socially Integrated (11/06/2024)  Recent Concern: Social Connections - Socially Isolated (08/22/2024)  Stress: Stress Concern Present (08/22/2024)  Tobacco Use: Medium Risk (11/07/2024)   SDOH Interventions:     Readmission Risk Interventions     No data to display

## 2024-11-07 NOTE — Telephone Encounter (Signed)
 Hold off on EGD until bronchitis is better Can reschedule in January 2026 RG

## 2024-11-07 NOTE — Telephone Encounter (Signed)
 Good afternoon Dr. Charlanne,   I received a call from this patient boyfriend requesting to cancel patient EGD due to her being in the hospital for a kidney infection and bronchitis. Patient was scheduled for an EGD on December the 16 th. Please advise.    Thank you

## 2024-11-08 ENCOUNTER — Other Ambulatory Visit (HOSPITAL_COMMUNITY): Payer: Self-pay

## 2024-11-08 DIAGNOSIS — N12 Tubulo-interstitial nephritis, not specified as acute or chronic: Secondary | ICD-10-CM | POA: Diagnosis not present

## 2024-11-08 DIAGNOSIS — R404 Transient alteration of awareness: Secondary | ICD-10-CM | POA: Diagnosis not present

## 2024-11-08 DIAGNOSIS — J189 Pneumonia, unspecified organism: Secondary | ICD-10-CM | POA: Diagnosis not present

## 2024-11-08 LAB — COMPREHENSIVE METABOLIC PANEL WITH GFR
ALT: 23 U/L (ref 0–44)
AST: 65 U/L — ABNORMAL HIGH (ref 15–41)
Albumin: 2.8 g/dL — ABNORMAL LOW (ref 3.5–5.0)
Alkaline Phosphatase: 191 U/L — ABNORMAL HIGH (ref 38–126)
Anion gap: 11 (ref 5–15)
BUN: 17 mg/dL (ref 8–23)
CO2: 15 mmol/L — ABNORMAL LOW (ref 22–32)
Calcium: 8.6 mg/dL — ABNORMAL LOW (ref 8.9–10.3)
Chloride: 110 mmol/L (ref 98–111)
Creatinine, Ser: 1.03 mg/dL — ABNORMAL HIGH (ref 0.44–1.00)
GFR, Estimated: 57 mL/min — ABNORMAL LOW (ref >60)
Glucose, Bld: 120 mg/dL — ABNORMAL HIGH (ref 70–99)
Potassium: 3.6 mmol/L (ref 3.5–5.1)
Sodium: 136 mmol/L (ref 135–145)
Total Bilirubin: 2.2 mg/dL — ABNORMAL HIGH (ref 0.0–1.2)
Total Protein: 6.2 g/dL — ABNORMAL LOW (ref 6.5–8.1)

## 2024-11-08 LAB — CBC
HCT: 33.5 % — ABNORMAL LOW (ref 36.0–46.0)
Hemoglobin: 11.8 g/dL — ABNORMAL LOW (ref 12.0–15.0)
MCH: 34.4 pg — ABNORMAL HIGH (ref 26.0–34.0)
MCHC: 35.2 g/dL (ref 30.0–36.0)
MCV: 97.7 fL (ref 80.0–100.0)
Platelets: 64 K/uL — ABNORMAL LOW (ref 150–400)
RBC: 3.43 MIL/uL — ABNORMAL LOW (ref 3.87–5.11)
RDW: 13.6 % (ref 11.5–15.5)
WBC: 8 K/uL (ref 4.0–10.5)
nRBC: 0 % (ref 0.0–0.2)

## 2024-11-08 LAB — MAGNESIUM: Magnesium: 2 mg/dL (ref 1.7–2.4)

## 2024-11-08 LAB — PHOSPHORUS: Phosphorus: 2.4 mg/dL — ABNORMAL LOW (ref 2.5–4.6)

## 2024-11-08 MED ORDER — ALBUTEROL SULFATE HFA 108 (90 BASE) MCG/ACT IN AERS
2.0000 | INHALATION_SPRAY | Freq: Four times a day (QID) | RESPIRATORY_TRACT | 0 refills | Status: AC | PRN
  Filled 2024-11-08: qty 6.7, 30d supply, fill #0

## 2024-11-08 MED ORDER — PHENOL 1.4 % MT LIQD: 1.0000 | OROMUCOSAL | Status: DC | PRN

## 2024-11-08 MED ORDER — LEVOFLOXACIN 750 MG PO TABS
750.0000 mg | ORAL_TABLET | Freq: Every day | ORAL | 0 refills | Status: AC
  Filled 2024-11-08: qty 12, 12d supply, fill #0

## 2024-11-08 MED ORDER — BENZONATATE 100 MG PO CAPS
100.0000 mg | ORAL_CAPSULE | Freq: Once | ORAL | Status: AC
  Administered 2024-11-08: 100 mg via ORAL
  Filled 2024-11-08 (×2): qty 1

## 2024-11-08 MED ORDER — GUAIFENESIN-DM 100-10 MG/5ML PO SYRP
5.0000 mL | ORAL_SOLUTION | ORAL | 0 refills | Status: AC | PRN
  Filled 2024-11-08: qty 118, 4d supply, fill #0

## 2024-11-08 MED ORDER — MENTHOL 3 MG MT LOZG
1.0000 | LOZENGE | OROMUCOSAL | Status: DC | PRN
  Administered 2024-11-08: 3 mg via ORAL
  Filled 2024-11-08: qty 9

## 2024-11-08 NOTE — Progress Notes (Signed)
 Discharge instructions were reviewed with the patient. Significant other at bedside during review. Questions, concerns were denied at this time.

## 2024-11-08 NOTE — TOC Progression Note (Signed)
 Transition of Care [facility]) - Progression Note    Patient Details  Name: Jeanne Smith MRN: [id number] Date of Birth: November 21, 1951  Transition of Care [facility] CM/SW Contact  Heather DELENA Saltness, LCSW Phone Number: 11/08/2024, 1:00 PM  Clinical Narrative:    CSW attempted to speak to pt's significant other, Curly Barbara [phone], via phone call to discuss discharge planning. No answer and unable to leave voicemail. TOC will continue to follow.   Expected Discharge Plan: Home/Self Care Barriers to Discharge: Continued Medical Work up  Expected Discharge Plan and Services   Discharge Planning Services: CM Consult Post Acute Care Choice: Resumption of Svcs/PTA Provider Living arrangements for the past 2 months: Single Family Home Expected Discharge Date: 11/08/24                  Social Drivers of Health (SDOH) Interventions SDOH Screenings   Food Insecurity: No Food Insecurity (11/06/2024)  Housing: Low Risk (11/06/2024)  Transportation Needs: No Transportation Needs (11/06/2024)  Utilities: Not At Risk (11/06/2024)  Alcohol  Screen: Low Risk (08/22/2024)  Depression (PHQ2-9): Low Risk (07/14/2024)  Financial Resource Strain: Patient Declined (08/22/2024)  Physical Activity: Insufficiently Active (08/22/2024)  Social Connections: Socially Integrated (11/06/2024)  Recent Concern: Social Connections - Socially Isolated (08/22/2024)  Stress: Stress Concern Present (08/22/2024)  Tobacco Use: Medium Risk (11/07/2024)    Readmission Risk Interventions     No data to display          Signed: Heather Saltness, MSW, LCSW Clinical Social Worker Inpatient Care Management 11/08/2024 1:01 PM

## 2024-11-08 NOTE — Progress Notes (Signed)
 Discharge meds in a secure bag delivered to patient in room by this RN

## 2024-11-08 NOTE — Discharge Summary (Signed)
 Physician Discharge Summary  Jeanne Smith FMW:[id number] DOB: [birth year] DOA: 11/06/2024  PCP: Jeanne Ronal PARAS, MD  Admit date: 11/06/2024 Discharge date: 11/08/2024  Admitted From: Home Disposition: Home  Recommendations for Outpatient Follow-up:  Follow up with PCP in 1-2 weeks Continue antibiotics with Levaquin  to complete 14-day course for community-acquired pneumonia, pyelonephritis Follow-up with gastroenterology, Jeanne Smith as scheduled.  Home Health: No Equipment/Devices: None  Discharge Condition: Stable CODE STATUS: Full code Diet recommendation: Heart healthy diet  History of present illness:  Jeanne Smith is a 73 y.o. female with past medical history significant for HTN, HLD, CKD stage IIIb, chronic back pain, spinal stenosis, hypothyroidism, proximal rectal adenocarcinoma s/p resection, EtOH cirrhosis who presented to [facility] ED on 11/06/2024 via EMS for confusion.  Patient endorses feeling fatigued with mild cough and chills for the last few days.   In the ED, temperature 98.7 F, HR 109, RR 20, BP 112/70, SpO2 97% on room air.  WBC 10.2, hemoglobin 12.9, platelet count 74.  Sodium 132, potassium 3.0, chloride 100, CO2 18,  glucose 138, BUN 24, creat 1.25.  AST 77, ALT 25, total bilirubin 2.1.  Lactic acid 2.9.  Influenza/RSV/COVID PCR negative.  Urinalysis with large leukocytes, negative nitrite, few bacteria, greater than 50 WBCs.  CT head without contrast with no evidence of acute intracranial abnormality.  CT chest/abdomen/pelvis with small right pleural effusion, mild parabronchial ground glass disease central right upper lobe concerning for pneumonia, slight increased bilateral perinephric fat stranding with cortical hypoenhancement left kidney suggestive of pyelonephritis, small nonobstructing left kidney stone, liver cirrhosis with evidence of portal hypertension, small volume perihepatic ascites, distended gallbladder with mild diffuse gallbladder  wall thickening with no calcified gallstone that is nonspecific, chronic pancreatitis with diffuse ductal dilation no acute inflammation, large stool burden, aortic atherosclerosis.  ED bili ordered 1 L NS bolus, started on azithromycin  and ceftriaxone .  TRH consulted for admission for further evaluation and management of pneumonia, pyelonephritis.  Hospital course:  Community acquired pneumonia CT chest with mild ground glass disease central right upper lobe consistent with pneumonia. --Blood cultures x 2: Pending -- Azithromycin  500 mg IV every 24 hours -- Ceftriaxone  2 g IV every 24 hours -- Robitussin every 4 hours as needed cough -- DuoNebs every 6 hours as needed wheezing/shortness of breath -- On room air.   Pyelonephritis CT abdomen/pelvis with bilateral perinephric fat stranding with cortical hypoenhancement left kidney consistent with pyelonephritis. -- Urine culture: Pending -- Blood cultures x 2: Pending -- Ceftriaxone  2 g IV every 24 hours -- NS with 40 meQ KCL at 100 mL/h   Hyponatremia Sodium 133, likely secondary to mild volume overload in the setting of cirrhosis/ascites versus hypovolemic hyponatremia in the setting of poor oral intake in the days preceding hospitalization. -- Continue IV fluid with NS at 100 mL/h -- CMP in am   Hypokalemia Potassium 3.1, will replete.  Repeat electrolytes in a.m.   Alcohol  cirrhosis with ascites Portal hypertension Thrombocytopenia The patient follows with Jeanne Smith gastroenterology, Jeanne Smith outpatient.  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  Continue outpatient follow-up with gastroenterology.     Latest Ref Rng & Units 11/08/2024    5:36 AM 11/07/2024    1:11 AM 11/06/2024    6:27 PM  Hepatic Function  Total Protein 6.5 - 8.1 g/dL 6.2  6.1  6.9   Albumin 3.5 - 5.0 g/dL 2.8  2.6  3.0   AST 15 - 41 U/L 65  70  77   ALT 0 - 44 U/L 23  22  25    Alk Phosphatase 38 - 126 U/L 191  206  214   Total Bilirubin 0.0 - 1.2  mg/dL 2.2  1.8  2.1    HTN At baseline on metoprolol  succinate 25 mg p.o. daily, amlodipine  5 mg p.o. daily, furosemide  20 mg 4 times weekly -- Borderline hypotensive on admission -- Continue to hold home) for now -- Monitor BP closely   HLD Not on lipid-lowering therapy outpatient.   CKD stage IIIb, stable Lab Results  Component Value Date   CREATININE 1.03 (H) 11/08/2024   CREATININE 1.13 (H) 11/07/2024   CREATININE 1.25 (H) 11/06/2024   Hypothyroidism Currently not on thyroid  medicine outpatient.   Anxiety Alprazolam  0.5 mg 2 times daily as needed anxiety.   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  Pending upcoming EGD; now planned for January.  Discharge Diagnoses:  Principal Problem:   AMS (altered mental status)    Discharge Instructions  Discharge Instructions     Call MD for:  difficulty breathing, headache or visual disturbances   Complete by: As directed    Call MD for:  extreme fatigue   Complete by: As directed    Call MD for:  persistant dizziness or light-headedness   Complete by: As directed    Call MD for:  persistant nausea and vomiting   Complete by: As directed    Call MD for:  severe uncontrolled pain   Complete by: As directed    Call MD for:  temperature >100.4   Complete by: As directed    Increase activity slowly   Complete by: As directed       Allergies as of 11/08/2024   No Known Allergies      Medication List     STOP taking these medications    amoxicillin 500 MG capsule Commonly known as: AMOXIL       TAKE these medications    albuterol  108 (90 Base) MCG/ACT inhaler Commonly known as: VENTOLIN  HFA Inhale 2 puffs into the lungs every 6 (six) hours as needed for wheezing or shortness of breath.   ALPRAZolam  0.5 MG tablet Commonly known as: XANAX  Take 0.5 mg by mouth See admin instructions.  Take 0.5 mg by mouth at bedtime and an additional 0.5 mg once a day as needed for anxiety   amLODipine  5 MG tablet Commonly known as: NORVASC  Take 1 tablet (5 mg total) by mouth daily.   aspirin  81 MG chewable tablet Chew 1 tablet (81 mg total) by mouth 2 (two) times daily. What changed: when to take this   b complex vitamins capsule Take 1 capsule by mouth daily.   Colace Clear 50 MG capsule Generic drug: docusate sodium  Take 50 mg by mouth 2 (two) times daily as needed for mild constipation.   estradiol  2 MG tablet Commonly known as: ESTRACE  Take 2 mg by mouth daily.   furosemide  20 MG tablet Commonly known as: LASIX  TAKE 1 TABLET(20 MG) BY MOUTH DAILY   guaiFENesin -dextromethorphan  100-10 MG/5ML syrup Commonly known as: ROBITUSSIN DM Take 5 mLs by mouth every 4 (four) hours as needed for cough.   HYDROcodone -acetaminophen  5-325 MG tablet Commonly known as: NORCO/VICODIN Take 1 tablet by mouth every 6 (six) hours as needed for moderate pain (pain score 4-6).   levofloxacin  750 MG tablet Commonly known as: Levaquin  Take 1 tablet (750 mg  total) by mouth daily for 12 days.   Magnesium  250 MG Tabs Take 250 mg by mouth at bedtime.   metoprolol  succinate 25 MG 24 hr tablet Commonly known as: TOPROL -XL TAKE 1 TABLET(25 MG) BY MOUTH DAILY What changed: See the new instructions.   multivitamin tablet Take 1 tablet by mouth daily with breakfast.   mupirocin  ointment 2 % Commonly known as: BACTROBAN  Apply 1 Application topically 2 (two) times daily.   NON FORMULARY Take 1 tablet by mouth See admin instructions. Dextromethorphan  10 mg + Guaifenesin  200 mg + Phenylephrine  5 mg = Take 1 tablet by mouth every 6 hours as needed for cold-like symptoms   nystatin-triamcinolone  cream Commonly known as: MYCOLOG II Apply 1 Application topically 2 (two) times daily as needed (for irritation).   potassium chloride  SA 20 MEQ tablet Commonly known as: KLOR-CON  M Take 1 tablet  (20 mEq total) by mouth daily.   PreviDent 5000 Booster Plus 1.1 % Pste Generic drug: Sodium Fluoride USE TO BRUSH TEETH AFTER MEALS AND BEFORE BEDTIME   Retaine HPMC 0.3 % Soln Generic drug: Hypromellose (PF) Place 1 drop into both eyes 3 (three) times daily as needed (for dryness).   Saline 0.9 % Aers Place 1 spray into both nostrils as needed (congestion).   Vitamin D  (Ergocalciferol ) 1.25 MG (50000 UNIT) Caps capsule Commonly known as: DRISDOL  TAKE 1 CAPSULE BY MOUTH EVERY 7 DAYS        Follow-up Information     Baxley, Ronal PARAS, MD. Schedule an appointment as soon as possible for a visit in 1 week(s).   Specialty: Internal Medicine Contact information: [address] [place] [address] [phone]                Allergies[1]  Consultations: None   Procedures/Studies: CT HEAD WO CONTRAST ( ) Result Date: 11/06/2024 CLINICAL DATA:  Altered mental status EXAM: CT HEAD WITHOUT CONTRAST TECHNIQUE: Contiguous axial images were obtained from the base of the skull through the vertex without intravenous contrast. RADIATION DOSE REDUCTION: This exam was performed according to the departmental dose-optimization program which includes automated exposure control, adjustment of the mA and/or kV according to patient size and/or use of iterative reconstruction technique. COMPARISON:  None Available. FINDINGS: Brain: No acute territorial infarction, hemorrhage or intracranial mass. Mild atrophy. Non enlarged ventricles Vascular: No hyperdense vessels.  Carotid vascular calcification Skull: Normal. Negative for fracture or focal lesion. Sinuses/Orbits: Mucosal thickening in the sinuses Other: None IMPRESSION: 1. No CT evidence for acute intracranial abnormality. Electronically Signed   By: Luke Bun M.D.   On: 11/06/2024 20:40   CT CHEST ABDOMEN PELVIS W CONTRAST Result Date: 11/06/2024 CLINICAL DATA:  Sepsis recent dental procedure EXAM: CT CHEST, ABDOMEN, AND  PELVIS WITH CONTRAST TECHNIQUE: Multidetector CT imaging of the chest, abdomen and pelvis was performed following the standard protocol during bolus administration of intravenous contrast. RADIATION DOSE REDUCTION: This exam was performed according to the departmental dose-optimization program which includes automated exposure control, adjustment of the mA and/or kV according to patient size and/or use of iterative reconstruction technique. CONTRAST:  80mL OMNIPAQUE  IOHEXOL  300 MG/ML  SOLN COMPARISON:  Chest x-ray 11/06/2024, CT 07/08/2024 FINDINGS: CT CHEST FINDINGS Cardiovascular: Advanced aortic atherosclerosis. No aneurysm. Multi vessel coronary vascular calcification. Normal cardiac size. No pericardial effusion Mediastinum/Nodes: Patent trachea. No thyroid  mass. Subcentimeter mediastinal lymph nodes. Esophagus within normal limits. Lungs/Pleura: Small right-sided pleural effusion. Mild peribronchovascular heterogeneous ground-glass disease in the central right upper lobe, series 7, image 44 through  53. Otherwise no focal airspace disease is seen. Musculoskeletal: Sternum appears intact. Multilevel degenerative changes. No acute osseous abnormality. Calcified bilateral breast implants. Appearance of right implant suspect for rupture. CT ABDOMEN PELVIS FINDINGS Hepatobiliary: Liver cirrhosis. No calcified gallstone. Mild diffuse gallbladder wall thickening. Gallbladder is distended. Pancreas: Atrophic. Numerous calcifications consistent with chronic pancreatitis. Diffuse ductal dilatation. Spleen: Appears slightly enlarged, oblique craniocaudal measurement of 13.5 cm. Adrenals/Urinary Tract: Adrenal glands are normal. Kidneys show no hydronephrosis. Increased bilateral perinephric fat stranding compared to prior. Heterogenous cortical hypoenhancement left kidney best seen on delayed images, for example lower pole series 12, image 31. Bladder obscured by artifact from hip prosthesis. Small left kidney stone  Stomach/Bowel: Stomach within normal limits. No dilated small bowel. No acute bowel wall thickening. Large stool burden Vascular/Lymphatic: Advanced aortic atherosclerosis. No aneurysm. Multiple subcentimeter retroperitoneal lymph nodes. Large upper abdominal varices. Reproductive: Obscured by artifact from hip prosthesis Other: No free air.  Small volume perihepatic ascites. Musculoskeletal: Bilateral hip replacements. No acute osseous abnormality. IMPRESSION: 1. Small right-sided pleural effusion. Mild peribronchovascular heterogeneous ground-glass disease in the central right upper lobe, possible pneumonia. 2. Slight increased bilateral perinephric fat stranding. Cortical hypoenhancement left kidney best seen on delayed views, appearance is suggestive of pyelonephritis. Suggest correlation with urinalysis. Small nonobstructing left kidney stone. 3. Liver cirrhosis with evidence of portal hypertension. Small volume perihepatic ascites. 4. Distended gallbladder with mild diffuse gallbladder wall thickening. No calcified gallstone. Gallbladder wall thickening is nonspecific in the setting of liver disease. Ultrasound follow-up if deemed appropriate. 5. Chronic pancreatitis with diffuse ductal dilatation. No acute inflammation. 6. Large stool burden. 7. Aortic atherosclerosis. Aortic Atherosclerosis (ICD10-I70.0). Electronically Signed   By: Luke Bun M.D.   On: 11/06/2024 20:38   DG Chest 2 View Result Date: 11/06/2024 CLINICAL DATA:  Cough, fever EXAM: CHEST - 2 VIEW COMPARISON:  11/15/2023 FINDINGS: Frontal and lateral views of the chest are obtained, evaluation limited by overlying artifact. Cardiac silhouette is unremarkable. No airspace disease, effusion, or pneumothorax. No acute bony abnormalities. IMPRESSION: 1. Limited study due to overlying artifact. No acute intrathoracic process. Electronically Signed   By: Ozell Daring M.D.   On: 11/06/2024 19:04     Subjective: Patient seen examined at  bedside, lying in bed.  No specific complaints this morning.  Dyspnea improved, continues with mild productive cough.  Confusion now resolved.  Discharging home.  No other questions or concerns at this time.  Denies headache, no dizziness, no chest pain, no palpitations, no fever/chills/night sweats, no nausea/vomiting/diarrhea, no focal weakness, no fatigue, no paresthesia.  No acute events overnight per nurse staff.  Discharge Exam: Vitals:   11/07/24 2019 11/08/24 0426  BP: 133/70 (!) 145/82  Pulse: (!) 101 (!) 108  Resp:  18  Temp: 97.7 F (36.5 C) 98.9 F (37.2 C)  SpO2: 98% 98%   Vitals:   11/07/24 1427 11/07/24 1749 11/07/24 2019 11/08/24 0426  BP:   133/70 (!) 145/82  Pulse:   (!) 101 (!) 108  Resp:    18  Temp:   97.7 F (36.5 C) 98.9 F (37.2 C)  TempSrc:   Oral Oral  SpO2: 97% 97% 98% 98%  Weight:      Height:        Physical Exam: GEN: NAD, alert and oriented x 3, wd/wn HEENT: NCAT, PERRL, EOMI, sclera clear, MMM PULM: Mild late expiratory wheezing upper air fields, no crackles, normal respiratory effort, room air with SpO2 98% CV: RRR w/o M/G/R GI: abd  soft, NTND, + BS MSK: no peripheral edema, muscle strength globally intact 5/5 bilateral upper/lower extremities NEURO: CN II-XII intact, no focal deficits, sensation to light touch intact PSYCH: normal mood/affect Integumentary: dry/intact, no rashes or wounds    The results of significant diagnostics from this hospitalization (including imaging, microbiology, ancillary and laboratory) are listed below for reference.     Microbiology: Recent Results (from the past 240 hours)  Blood culture (routine x 2)     Status: None (Preliminary result)   Collection Time: 11/06/24  1:11 AM   Specimen: BLOOD  Result Value Ref Range Status   Specimen Description   Final    BLOOD RIGHT ANTECUBITAL Performed at [facility], [address], [place], [place] [address]    Special Requests   Final     BOTTLES DRAWN AEROBIC AND ANAEROBIC Blood Culture adequate volume Performed at [facility], [address], [place], [place] [address]    Culture   Final    NO GROWTH 1 DAY Performed at [facility], [address] [place], [place] [address]    Report Status PENDING  Incomplete  Blood culture (routine x 2)     Status: None (Preliminary result)   Collection Time: 11/06/24  6:10 PM   Specimen: BLOOD RIGHT ARM  Result Value Ref Range Status   Specimen Description   Final    BLOOD RIGHT ARM Performed at [facility], [address] [place], [place] [address]    Special Requests   Final    BOTTLES DRAWN AEROBIC AND ANAEROBIC Blood Culture adequate volume Performed at [facility], [address], [place], [place] [address]    Culture   Final    NO GROWTH 2 DAYS Performed at [facility], [address] [place], [place] [address]    Report Status PENDING  Incomplete  Resp panel by RT-PCR (RSV, Flu A&B, Covid) Anterior Nasal Swab     Status: None   Collection Time: 11/06/24  7:02 PM   Specimen: Anterior Nasal Swab  Result Value Ref Range Status   SARS Coronavirus 2 by RT PCR NEGATIVE NEGATIVE Final    Comment: (NOTE) SARS-CoV-2 target nucleic acids are NOT DETECTED.  The SARS-CoV-2 RNA is generally detectable in upper respiratory specimens during the acute phase of infection. The lowest concentration of SARS-CoV-2 viral copies this assay can detect is 138 copies/mL. A negative result does not preclude SARS-Cov-2 infection and should not be used as the sole basis for treatment or other patient management decisions. A negative result may occur with  improper specimen collection/handling, submission of specimen other than nasopharyngeal swab, presence of viral mutation(s) within the areas targeted by this assay, and inadequate number of viral copies(<138 copies/mL). A negative result must be combined with clinical  observations, patient history, and epidemiological information. The expected result is Negative.  Fact Sheet for Patients:  bloggercourse.com  Fact Sheet for Healthcare Providers:  seriousbroker.it  This test is no t yet approved or cleared by the [place]  FDA and  has been authorized for detection and/or diagnosis of SARS-CoV-2 by FDA under an Emergency Use Authorization (EUA). This EUA will remain  in effect (meaning this test can be used) for the duration of the COVID-19 declaration under Section 564(b)(1) of the Act, 21 U.S.C.section 360bbb-3(b)(1), unless the authorization is terminated  or revoked sooner.       Influenza A by PCR NEGATIVE NEGATIVE Final   Influenza  B by PCR NEGATIVE NEGATIVE Final    Comment: (NOTE) The Xpert Xpress SARS-CoV-2/FLU/RSV plus assay is intended as an aid in the diagnosis of influenza from Nasopharyngeal swab specimens and should not be used as a sole basis for treatment. Nasal washings and aspirates are unacceptable for Xpert Xpress SARS-CoV-2/FLU/RSV testing.  Fact Sheet for Patients: bloggercourse.com  Fact Sheet for Healthcare Providers: seriousbroker.it  This test is not yet approved or cleared by the [place]  FDA and has been authorized for detection and/or diagnosis of SARS-CoV-2 by FDA under an Emergency Use Authorization (EUA). This EUA will remain in effect (meaning this test can be used) for the duration of the COVID-19 declaration under Section 564(b)(1) of the Act, 21 U.S.C. section 360bbb-3(b)(1), unless the authorization is terminated or revoked.     Resp Syncytial Virus by PCR NEGATIVE NEGATIVE Final    Comment: (NOTE) Fact Sheet for Patients: bloggercourse.com  Fact Sheet for Healthcare Providers: seriousbroker.it  This test is not yet approved or cleared by  the [place]  FDA and has been authorized for detection and/or diagnosis of SARS-CoV-2 by FDA under an Emergency Use Authorization (EUA). This EUA will remain in effect (meaning this test can be used) for the duration of the COVID-19 declaration under Section 564(b)(1) of the Act, 21 U.S.C. section 360bbb-3(b)(1), unless the authorization is terminated or revoked.  Performed at [facility], [address], [place], [place] [address]   Urine Culture     Status: Abnormal (Preliminary result)   Collection Time: 11/06/24  8:57 PM   Specimen: Urine, Random  Result Value Ref Range Status   Specimen Description   Final    URINE, RANDOM Performed at [facility], [address] [place], [place] [address]    Special Requests   Final    NONE Reflexed from [phone] Performed at [facility], [address] [place], [place] [address]    Culture (A)  Final    20,000 COLONIES/mL ESCHERICHIA COLI SUSCEPTIBILITIES TO FOLLOW Performed at [facility], [address], [place], [place] [address]    Report Status PENDING  Incomplete     Labs: BNP (last 3 results) No results for input(s): BNP in the last 8760 hours. Basic Metabolic Panel: Recent Labs  Lab 11/06/24 1827 11/07/24 0111 11/08/24 0536  NA 132* 133* 136  K 3.0* 3.1* 3.6  CL 100 105 110  CO2 18* 16* 15*  GLUCOSE 138* 112* 120*  BUN 24* 22 17  CREATININE 1.25* 1.13* 1.03*  CALCIUM  8.8* 8.2* 8.6*  MG  --  1.7 2.0  PHOS  --  2.4* 2.4*   Liver Function Tests: Recent Labs  Lab 11/06/24 1827 11/07/24 0111 11/08/24 0536  AST 77* 70* 65*  ALT 25 22 23   ALKPHOS 214* 206* 191*  BILITOT 2.1* 1.8* 2.2*  PROT 6.9 6.1* 6.2*  ALBUMIN 3.0* 2.6* 2.8*   No results for input(s): LIPASE, AMYLASE in the last 168 hours. No results for input(s): AMMONIA in the last 168 hours. CBC: Recent Labs  Lab 11/06/24 1827 11/07/24 0111 11/08/24 0536  WBC 10.2 8.5  8.0  NEUTROABS 8.8* 6.7  --   HGB 12.9 11.9* 11.8*  HCT 36.5 32.8* 33.5*  MCV 97.3 96.5 97.7  PLT 74* 70* 64*   Cardiac Enzymes: No results for input(s): CKTOTAL, CKMB, CKMBINDEX, TROPONINI in the last 168 hours. BNP: Invalid input(s): POCBNP CBG: No results for input(s): GLUCAP in the last 168 hours. D-Dimer No  results for input(s): DDIMER in the last 72 hours. Hgb A1c No results for input(s): HGBA1C in the last 72 hours. Lipid Profile No results for input(s): CHOL, HDL, LDLCALC, TRIG, CHOLHDL, LDLDIRECT in the last 72 hours. Thyroid  function studies No results for input(s): TSH, T4TOTAL, T3FREE, THYROIDAB in the last 72 hours.  Invalid input(s): FREET3 Anemia work up No results for input(s): VITAMINB12, FOLATE, FERRITIN, TIBC, IRON, RETICCTPCT in the last 72 hours. Urinalysis    Component Value Date/Time   COLORURINE YELLOW 11/06/2024 2057   APPEARANCEUR HAZY (A) 11/06/2024 2057   LABSPEC 1.019 11/06/2024 2057   PHURINE 7.0 11/06/2024 2057   GLUCOSEU NEGATIVE 11/06/2024 2057   HGBUR NEGATIVE 11/06/2024 2057   BILIRUBINUR NEGATIVE 11/06/2024 2057   BILIRUBINUR neg 04/02/2023 1629   KETONESUR NEGATIVE 11/06/2024 2057   PROTEINUR NEGATIVE 11/06/2024 2057   UROBILINOGEN 0.2 04/02/2023 1629   UROBILINOGEN 0.2 06/15/2015 1013   NITRITE NEGATIVE 11/06/2024 2057   LEUKOCYTESUR LARGE (A) 11/06/2024 2057   Sepsis Labs Recent Labs  Lab 11/06/24 1827 11/07/24 0111 11/08/24 0536  WBC 10.2 8.5 8.0   Microbiology Recent Results (from the past 240 hours)  Blood culture (routine x 2)     Status: None (Preliminary result)   Collection Time: 11/06/24  1:11 AM   Specimen: BLOOD  Result Value Ref Range Status   Specimen Description   Final    BLOOD RIGHT ANTECUBITAL Performed at [facility], [address] [place], [place] [address]    Special Requests   Final    BOTTLES DRAWN AEROBIC AND ANAEROBIC Blood  Culture adequate volume Performed at [facility], [address] [place], [place] [address]    Culture   Final    NO GROWTH 1 DAY Performed at [facility], [address], [place], [place] [address]    Report Status PENDING  Incomplete  Blood culture (routine x 2)     Status: None (Preliminary result)   Collection Time: 11/06/24  6:10 PM   Specimen: BLOOD RIGHT ARM  Result Value Ref Range Status   Specimen Description   Final    BLOOD RIGHT ARM Performed at [facility], [address], [place], [place] [address]    Special Requests   Final    BOTTLES DRAWN AEROBIC AND ANAEROBIC Blood Culture adequate volume Performed at [facility], [address] [place], [place] [address]    Culture   Final    NO GROWTH 2 DAYS Performed at [facility], [address] [place], [place] [address]    Report Status PENDING  Incomplete  Resp panel by RT-PCR (RSV, Flu A&B, Covid) Anterior Nasal Swab     Status: None   Collection Time: 11/06/24  7:02 PM   Specimen: Anterior Nasal Swab  Result Value Ref Range Status   SARS Coronavirus 2 by RT PCR NEGATIVE NEGATIVE Final    Comment: (NOTE) SARS-CoV-2 target nucleic acids are NOT DETECTED.  The SARS-CoV-2 RNA is generally detectable in upper respiratory specimens during the acute phase of infection. The lowest concentration of SARS-CoV-2 viral copies this assay can detect is 138 copies/mL. A negative result does not preclude SARS-Cov-2 infection and should not be used as the sole basis for treatment or other patient management decisions. A negative result may occur with  improper specimen collection/handling, submission of specimen other than nasopharyngeal swab, presence of viral mutation(s) within the areas targeted by this assay, and inadequate number  of viral copies(<138 copies/mL). A negative result must be combined with clinical observations, patient history, and  epidemiological information. The expected result is Negative.  Fact Sheet for Patients:  bloggercourse.com  Fact Sheet for Healthcare Providers:  seriousbroker.it  This test is no t yet approved or cleared by the [place]  FDA and  has been authorized for detection and/or diagnosis of SARS-CoV-2 by FDA under an Emergency Use Authorization (EUA). This EUA will remain  in effect (meaning this test can be used) for the duration of the COVID-19 declaration under Section 564(b)(1) of the Act, 21 U.S.C.section 360bbb-3(b)(1), unless the authorization is terminated  or revoked sooner.       Influenza A by PCR NEGATIVE NEGATIVE Final   Influenza B by PCR NEGATIVE NEGATIVE Final    Comment: (NOTE) The Xpert Xpress SARS-CoV-2/FLU/RSV plus assay is intended as an aid in the diagnosis of influenza from Nasopharyngeal swab specimens and should not be used as a sole basis for treatment. Nasal washings and aspirates are unacceptable for Xpert Xpress SARS-CoV-2/FLU/RSV testing.  Fact Sheet for Patients: bloggercourse.com  Fact Sheet for Healthcare Providers: seriousbroker.it  This test is not yet approved or cleared by the [place]  FDA and has been authorized for detection and/or diagnosis of SARS-CoV-2 by FDA under an Emergency Use Authorization (EUA). This EUA will remain in effect (meaning this test can be used) for the duration of the COVID-19 declaration under Section 564(b)(1) of the Act, 21 U.S.C. section 360bbb-3(b)(1), unless the authorization is terminated or revoked.     Resp Syncytial Virus by PCR NEGATIVE NEGATIVE Final    Comment: (NOTE) Fact Sheet for Patients: bloggercourse.com  Fact Sheet for Healthcare Providers: seriousbroker.it  This test is not yet approved or cleared by the [place]  FDA and has been  authorized for detection and/or diagnosis of SARS-CoV-2 by FDA under an Emergency Use Authorization (EUA). This EUA will remain in effect (meaning this test can be used) for the duration of the COVID-19 declaration under Section 564(b)(1) of the Act, 21 U.S.C. section 360bbb-3(b)(1), unless the authorization is terminated or revoked.  Performed at [facility], [address], [place], [place] [address]   Urine Culture     Status: Abnormal (Preliminary result)   Collection Time: 11/06/24  8:57 PM   Specimen: Urine, Random  Result Value Ref Range Status   Specimen Description   Final    URINE, RANDOM Performed at [facility], [address], [place], [place] [address]    Special Requests   Final    NONE Reflexed from [id number] Performed at [facility], [address] [place], [place] [address]    Culture (A)  Final    20,000 COLONIES/mL ESCHERICHIA COLI SUSCEPTIBILITIES TO FOLLOW Performed at [facility], [address], [place], [place] [address]    Report Status PENDING  Incomplete     Time coordinating discharge: Over 30 minutes  SIGNED:   Camellia Smith Verma Grothaus, DO  [facility] 11/08/2024, 11:01 AM     [1] No Known Allergies

## 2024-11-09 ENCOUNTER — Other Ambulatory Visit (HOSPITAL_COMMUNITY): Payer: Self-pay

## 2024-11-10 LAB — URINE CULTURE: Culture: 20000 — AB

## 2024-11-11 ENCOUNTER — Telehealth: Payer: Self-pay | Admitting: *Deleted

## 2024-11-11 ENCOUNTER — Encounter: Admitting: Gastroenterology

## 2024-11-11 NOTE — Telephone Encounter (Signed)
 Informed patient that since CT on 12/11 showed no cancer, OK to wait and see him on 12/16/23. She is appreciative.

## 2024-11-11 NOTE — Telephone Encounter (Signed)
 Called to inquire if she still needs to come to lab/OV (scan review) on 11/13/24? Just d/c from hospital 12/13 for pneumonia and pyelonephritis. Had CT C/A/P in hospital on 12/11. Or could she just keep the 12/16/23 visit?

## 2024-11-12 LAB — CULTURE, BLOOD (ROUTINE X 2)
Culture  Setup Time: NO GROWTH
Culture: NO GROWTH
Special Requests: ADEQUATE
Special Requests: ADEQUATE

## 2024-11-13 ENCOUNTER — Inpatient Hospital Stay

## 2024-11-13 ENCOUNTER — Inpatient Hospital Stay: Admitting: Oncology

## 2024-11-14 ENCOUNTER — Ambulatory Visit
Admission: RE | Admit: 2024-11-14 | Discharge: 2024-11-14 | Disposition: A | Source: Ambulatory Visit | Attending: Internal Medicine | Admitting: Internal Medicine

## 2024-11-14 ENCOUNTER — Ambulatory Visit: Payer: Self-pay | Admitting: Internal Medicine

## 2024-11-14 ENCOUNTER — Encounter: Payer: Self-pay | Admitting: Internal Medicine

## 2024-11-14 ENCOUNTER — Ambulatory Visit (INDEPENDENT_AMBULATORY_CARE_PROVIDER_SITE_OTHER): Admitting: Internal Medicine

## 2024-11-14 VITALS — BP 110/60 | HR 87 | Ht 64.25 in | Wt 120.0 lb

## 2024-11-14 DIAGNOSIS — E782 Mixed hyperlipidemia: Secondary | ICD-10-CM

## 2024-11-14 DIAGNOSIS — R600 Localized edema: Secondary | ICD-10-CM | POA: Diagnosis not present

## 2024-11-14 DIAGNOSIS — Z85048 Personal history of other malignant neoplasm of rectum, rectosigmoid junction, and anus: Secondary | ICD-10-CM | POA: Diagnosis not present

## 2024-11-14 DIAGNOSIS — Z09 Encounter for follow-up examination after completed treatment for conditions other than malignant neoplasm: Secondary | ICD-10-CM | POA: Diagnosis not present

## 2024-11-14 DIAGNOSIS — E876 Hypokalemia: Secondary | ICD-10-CM | POA: Diagnosis not present

## 2024-11-14 DIAGNOSIS — B9629 Other Escherichia coli [E. coli] as the cause of diseases classified elsewhere: Secondary | ICD-10-CM | POA: Diagnosis not present

## 2024-11-14 DIAGNOSIS — N1832 Chronic kidney disease, stage 3b: Secondary | ICD-10-CM

## 2024-11-14 DIAGNOSIS — E039 Hypothyroidism, unspecified: Secondary | ICD-10-CM

## 2024-11-14 DIAGNOSIS — C189 Malignant neoplasm of colon, unspecified: Secondary | ICD-10-CM

## 2024-11-14 DIAGNOSIS — I10 Essential (primary) hypertension: Secondary | ICD-10-CM | POA: Diagnosis not present

## 2024-11-14 DIAGNOSIS — E871 Hypo-osmolality and hyponatremia: Secondary | ICD-10-CM

## 2024-11-14 DIAGNOSIS — Z1612 Extended spectrum beta lactamase (ESBL) resistance: Secondary | ICD-10-CM

## 2024-11-14 DIAGNOSIS — Z8639 Personal history of other endocrine, nutritional and metabolic disease: Secondary | ICD-10-CM

## 2024-11-14 DIAGNOSIS — E559 Vitamin D deficiency, unspecified: Secondary | ICD-10-CM

## 2024-11-14 DIAGNOSIS — R790 Abnormal level of blood mineral: Secondary | ICD-10-CM

## 2024-11-14 DIAGNOSIS — J189 Pneumonia, unspecified organism: Secondary | ICD-10-CM

## 2024-11-14 DIAGNOSIS — Z96643 Presence of artificial hip joint, bilateral: Secondary | ICD-10-CM

## 2024-11-14 DIAGNOSIS — J90 Pleural effusion, not elsewhere classified: Secondary | ICD-10-CM

## 2024-11-14 DIAGNOSIS — N390 Urinary tract infection, site not specified: Secondary | ICD-10-CM

## 2024-11-14 DIAGNOSIS — F411 Generalized anxiety disorder: Secondary | ICD-10-CM

## 2024-11-14 LAB — POCT URINALYSIS DIP (CLINITEK)
Bilirubin, UA: NEGATIVE
Blood, UA: NEGATIVE
Glucose, UA: NEGATIVE mg/dL
Ketones, POC UA: NEGATIVE mg/dL
Leukocytes, UA: NEGATIVE
Nitrite, UA: NEGATIVE
POC PROTEIN,UA: NEGATIVE
Spec Grav, UA: 1.01
Urobilinogen, UA: 0.2 U/dL
pH, UA: 6.5

## 2024-11-14 MED ORDER — PANCRELIPASE (LIP-PROT-AMYL) 36000-114000 UNITS PO CPEP: ORAL_CAPSULE | ORAL | 11 refills | Status: AC

## 2024-11-14 NOTE — Progress Notes (Addendum)
 "   Patient Care Team: Perri Ronal PARAS, MD as PCP - General Jordan, Peter M, MD as PCP - Cardiology (Cardiology)  Visit Date: 11/14/2024  Subjective:    Patient ID: Jeanne Smith , Female   DOB: 31-Aug-1951, 73 y.o.    MRN: [id number]   73 y.o. Female presents today for Hospital follow up. Patient has a past medical history of Hypertension, Hyperlipidemia, Hypokalemia, Vitamin D  deficiency.  She presented to the ED on 11/06/2024 due to altered mental status. She was feeling very fatigued with mild cough for several  days. In the ED, she was tachycardic and tachypneic.  Chest x-ray revealed no acute intrathoracic process. CT chest abdomen and pelvis with contrast revealed small right-sided pleural effusion, mild peribronchial bronchovascular heterogeneous ground glass disease in the central right upper lobe, possible pneumonia.  Slightly increased bilateral perinephric fat stranding.  Appearance suggestive of pyelonephritis.  Small nonobstructing left kidney stone.  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  Distended gallbladder with mild diffuse gallbladder wall thickening no calcified gallstone.  Gallbladder wall thickening is nonspecific in the setting of liver disease.  Chronic pancreatitis with diffuse ductal dilatation no acute inflammation.  Large stool burden.  Aortic atherosclerosis. WBC 10.2, hemoglobin 12.9, platelet count 74. Sodium 132, potassium 3.0, chloride 100, CO2 18, glucose 138, BUN 24, creat 1.25. AST 77, ALT 25, total bilirubin 2.1. Lactic acid 2.9, Phosphorus 2.4. Influenza/RSV/COVID PCR negative. Urinalysis with large leukocytes, negative nitrite, few bacteria, greater than 50 WBCs.  Says last ETOH intake was about 4 months ago per Hx and PE.  Chest CT showed subcentimeter mediastinal lymph nodes and small right pleural effusion.Cirrhosis was present.Pancreas showed numerous calcifications and diffuse ductal dilatation. Spleen  slightly enlarged. Small left kidney stone.   She was given IV Azithromycin  and Ceftriaxone  as well as Robitussin and DuoNebs for coughing and wheezing. She was prescribed Levaquin  750 mg daily for 12 days and discharged.  She says that she does not remember being taken to the hospital by EMS. She said that the day she went to the hospital she began to experience chills and cough. She said that she feels much better she is just fatigued. She said that she never had any dysuria.   History of edema treated with Furosemide  20 mg daily. Reports this is helping symptoms without negative side effects. Says she takes it 4 times a week.    History of Hypertension treated with Metoprolol  Succinate 25 mg daily. Blood pressure today is normal at 110/60.   History of Paroxysmal atrial fibrillation.    History of Hypokalemia treated with Klor-Con  meq twice daily as needed.    History of Vitamin D  deficiency treated with Drisdol  50,000 units weekly.  Dx with rectal cancer 12-14 cm from anal verge on colonoscopy June 2023-Pathology showed invasive well to moderately differentiated adenocarcinoma arising in an adenoma with high grade dysplasia. MRI Stage T1/T2 N0 S/probiotic low anterior resection July 05 2022 moderately well differentiated adenocarcinoma of rectosigmoid junction,pT2pN).0/15 nodes. No lymphovascular or perineural invasion  Past Medical History:  Diagnosis Date   Anxiety    Arthritis    oa   Attention deficit disorder    Cancer [facility])    Dysrhythmia 2019   episode of palpitations x 25 January 2018, stress test normal   H/O seasonal allergies    Hyperlipidemia    Hypertension    Hypothyroidism    thyroid  nodules, off synthroid > 15 years   Insomnia  Osteoarthritis    Osteopenia    Palpitations    Pneumonia    Thyroid  nodule      Family History  Problem Relation Age of Onset   Heart failure Mother    Hypertension Father    Cancer Father        lung   Stroke Father    Heart  disease Brother    Alcoholism Brother    Colon cancer Neg Hx    Pancreatic cancer Neg Hx    Esophageal cancer Neg Hx    Liver cancer Neg Hx    Stomach cancer Neg Hx   Hx sciatica right side August 2023. Colonoscopy done  June 2023 for changes in bowel habits. 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  Had ORIF right radial shaft fx and removal of previous distal radius plate and associated screws January 2022.  Right radial fx Jan 2022  Social history: Widow. Has steady female partner. Has adult children. Son with hx of alcohol  issues. 2 daughters. One resides in [place] and the other one lives in [place].  Family hx:   Review of Systems  All other systems reviewed and are negative.       Objective:   Vitals: BP 110/60   Pulse 87   Ht 5' 4.25 (1.632 m)   Wt 120 lb (54.4 kg)   SpO2 98%   BMI 20.44 kg/m    Physical Exam Vitals and nursing note reviewed.  Constitutional:      General: She is not in acute distress.    Appearance: Normal appearance. She is not toxic-appearing.  HENT:     Head: Normocephalic and atraumatic.  Cardiovascular:     Rate and Rhythm: Normal rate and regular rhythm. No extrasystoles are present.    Pulses: Normal pulses.     Heart sounds: Normal heart sounds. No murmur heard.    No friction rub. No gallop.  Pulmonary:     Effort: Pulmonary effort is normal. No respiratory distress.     Breath sounds: Normal breath sounds. No wheezing or rales.  Skin:    General: Skin is warm and dry.  Neurological:     Mental Status: She is alert and oriented to person, place, and time. Mental status is at baseline.  Psychiatric:        Mood and Affect: Mood normal.        Behavior: Behavior normal.        Thought Content: Thought content normal.        Judgment: Judgment normal.       Results:     Labs:        Component Value Date/Time   NA 136 11/08/2024 0536   K 3.6 11/08/2024 0536   CL 110 11/08/2024 0536   CO2 15 (L) 11/08/2024 0536   GLUCOSE 120 (H) 11/08/2024 0536   BUN 17 11/08/2024 0536   CREATININE 1.03 (H) 11/08/2024 0536   CREATININE 0.98 07/29/2024 0932   CALCIUM  8.6 (L) 11/08/2024 0536   PROT 6.2 (L) 11/08/2024 0536   ALBUMIN 2.8 (L) 11/08/2024 0536   AST 65 (H) 11/08/2024 0536   ALT 23 11/08/2024 0536   ALKPHOS 191 (H) 11/08/2024 0536   BILITOT 2.2 (H) 11/08/2024 0536   GFRNONAA 57 (L) 11/08/2024 0536   GFRNONAA 47 (L) 07/08/2024 1405   GFRNONAA 75 01/17/2021 0923   GFRAA 87 01/17/2021 9076  Lab Results  Component Value Date   WBC 8.0 11/08/2024   HGB 11.8 (L) 11/08/2024   HCT 33.5 (L) 11/08/2024   MCV 97.7 11/08/2024   PLT 64 (L) 11/08/2024    Lab Results  Component Value Date   CHOL 121 06/19/2024   HDL 28 (L) 06/19/2024   LDLCALC 74 06/19/2024   TRIG 108 06/19/2024   CHOLHDL 4.3 06/19/2024    Lab Results  Component Value Date   HGBA1C 5.6 06/19/2024     Lab Results  Component Value Date   TSH 1.21 06/19/2024        Assessment & Plan:   Orders Placed This Encounter  Procedures   DG Chest 2 View    Standing Status:   Future    Expiration Date:   11/14/2025    Reason for Exam (SYMPTOM  OR DIAGNOSIS REQUIRED):   pneumonia    Preferred imaging location?:   [facility]   CBC with Differential/Platelet   Comprehensive metabolic panel with GFR   Phosphorus   TSH   T4, free   Meds ordered this encounter  Medications   lipase/protease/amylase (CREON ) 36000 UNITS CPEP capsule    Sig: Take 2 capsules (72,000 Units total) by mouth 3 (three) times daily with meals. May also take 1 capsule (36,000 Units total) as needed (with snacks - up to 4 snacks daily).    Dispense:  300 capsule    Refill:  11    Hospital discharge follow up: She presented to the ER on 11/06/2024 due to altered mental status. She was feeling very fatigued  with mild cough for the past few days. In the ER, she was tachycardic and tachypneic.  Chest x-ray revealed no acute intrathoracic process. CT chest abdomen and pelvis with contrast revealed  small right-sided pleural effusion, mild peribronchial bronchovascular heterogeneous ground glass disease in the central right upper lobe, possible pneumonia.  Slightly increased bilateral perinephric fat stranding.  Appearance is suggestive of pyelonephritis.  Small nonobstructing left kidney stone.  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  Distended gallbladder with mild diffuse gallbladder wall thickening no calcified gallstone.  Gallbladder wall thickening is nonspecific in the setting of liver disease.  Chronic pancreatitis with diffuse ductal dilatation no acute inflammation.  Large stool burden.  Aortic atherosclerosis. WBC 10.2, hemoglobin 12.9, platelet count 74. Sodium 132, potassium 3.0, chloride 100, CO2 18, glucose 138, BUN 24, creat 1.25. AST 77, ALT 25, total bilirubin 2.1. Lactic acid 2.9, Phosphorus 2.4. Influenza/RSV/COVID PCR negative. Urinalysis with large leukocytes, negative nitrite, few bacteria, greater than 50 WBCs. She was given IV Azithromycin  and Ceftriaxone  as well as Robitussin and DuoNebs for coughing and wheezing. She was prescribed Levaquin  750 mg daily for 12 days and discharged.  She says that she does not remember being taken to the hospital by EMS. She said that the day she went to the hospital she began to experience chills and cough. She said that she feels much better she is just fatigued. She said that she never had any dysuria.    CBC, CMP, Phosphorus, Free T4, TSH ordered.   Chest Xray ordered.    Creon  36000 units two capsules three times daily prescribed     LE Edema: treated with Furosemide  20 mg daily. Reports this is helping symptoms without negative side effects. Says she takes it 4 times a week.    Hypertension: treated with  Metoprolol  Succinate 25 mg daily. Blood pressure today is normal at  110/60.   History of paroxysmal atrial fibrillation.    Hypokalemia: treated with Klor-Con  meq twice daily as needed. Potassium is 3.7.  Hyponatremia- resolved. Sodium is 138   Vitamin D  deficiency: treated with Drisdol  50,000 units weekly.  ESBL  E.coli urinary infection- urine dipstick today is normal. Has been on Levaquin . Culture showed intermediate activity to Cipro. Today urine  dipstick has significantly improved. Asymptomatic.  Community acquired pneumonia: to have repeat CXR today. Had right pleural effusion on admission. Chest is clear today.  Hypothyroidism treated with thyroid  replacement medication  Hx of CKD- creatinine was  normal at 0.98 at discharge  Hx of rectal cancer followed by Dr. Cloretta, Oncologist and Gastroenterology. Last colonoscopy June 2025.  Plan: Advise patient results of labs and CXR results  Has follow up with Oncologist in mid January. We should  see her again in 2 weeks.She looks frail today.  I,Makayla C Reid,acting as a scribe for Ronal JINNY Hailstone, MD.,have documented all relevant documentation on the behalf of Ronal JINNY Hailstone, MD,as directed by  Ronal JINNY Hailstone, MD while in the presence of Ronal JINNY Hailstone, MD.  I, Ronal JINNY Hailstone, MD, have reviewed all documentation for this visit. The documentation on 11/14/2024 for the exam, diagnosis, procedures, and orders are all accurate and complete.    "

## 2024-11-15 LAB — T4, FREE: Free T4: 1.2 ng/dL (ref 0.8–1.8)

## 2024-11-15 LAB — COMPREHENSIVE METABOLIC PANEL WITH GFR
AG Ratio: 0.8 (calc) — ABNORMAL LOW (ref 1.0–2.5)
ALT: 30 U/L — ABNORMAL HIGH (ref 6–29)
AST: 77 U/L — ABNORMAL HIGH (ref 10–35)
Albumin: 3 g/dL — ABNORMAL LOW (ref 3.6–5.1)
Alkaline phosphatase (APISO): 228 U/L — ABNORMAL HIGH (ref 37–153)
BUN: 9 mg/dL (ref 7–25)
CO2: 24 mmol/L (ref 20–32)
Calcium: 9.2 mg/dL (ref 8.6–10.4)
Chloride: 105 mmol/L (ref 98–110)
Creat: 0.98 mg/dL (ref 0.60–1.00)
Globulin: 3.6 g/dL (ref 1.9–3.7)
Glucose, Bld: 119 mg/dL — ABNORMAL HIGH (ref 65–99)
Potassium: 3.7 mmol/L (ref 3.5–5.3)
Sodium: 138 mmol/L (ref 135–146)
Total Bilirubin: 2.8 mg/dL — ABNORMAL HIGH (ref 0.2–1.2)
Total Protein: 6.6 g/dL (ref 6.1–8.1)
eGFR: 61 mL/min/1.73m2

## 2024-11-15 LAB — CBC WITH DIFFERENTIAL/PLATELET
Absolute Lymphocytes: 1183 {cells}/uL (ref 850–3900)
Absolute Monocytes: 883 {cells}/uL (ref 200–950)
Basophils Absolute: 109 {cells}/uL (ref 0–200)
Basophils Relative: 1.2 %
Eosinophils Absolute: 137 {cells}/uL (ref 15–500)
Eosinophils Relative: 1.5 %
HCT: 37.1 % (ref 35.9–46.0)
Hemoglobin: 12.7 g/dL (ref 11.7–15.5)
MCH: 34 pg — ABNORMAL HIGH (ref 27.0–33.0)
MCHC: 34.2 g/dL (ref 31.6–35.4)
MCV: 99.2 fL (ref 81.4–101.7)
MPV: 13.3 fL — ABNORMAL HIGH (ref 7.5–12.5)
Monocytes Relative: 9.7 %
Neutro Abs: 6789 {cells}/uL (ref 1500–7800)
Neutrophils Relative %: 74.6 %
Platelets: 135 Thousand/uL — ABNORMAL LOW (ref 140–400)
RBC: 3.74 Million/uL — ABNORMAL LOW (ref 3.80–5.10)
RDW: 11.9 % (ref 11.0–15.0)
Total Lymphocyte: 13 %
WBC: 9.1 Thousand/uL (ref 3.8–10.8)

## 2024-11-15 LAB — PHOSPHORUS: Phosphorus: 2.5 mg/dL (ref 2.1–4.3)

## 2024-11-15 LAB — TSH: TSH: 1.75 m[IU]/L (ref 0.40–4.50)

## 2024-11-16 NOTE — Patient Instructions (Signed)
 Rest and stay well hydrated. Labs drawn and pending. Please have repeat CXR today to follow up on pneumonia. Further instructions pending results of labs.

## 2024-11-18 ENCOUNTER — Other Ambulatory Visit: Payer: Self-pay | Admitting: Internal Medicine

## 2024-11-18 DIAGNOSIS — I10 Essential (primary) hypertension: Secondary | ICD-10-CM

## 2024-11-19 ENCOUNTER — Encounter: Payer: Self-pay | Admitting: Internal Medicine

## 2024-11-20 NOTE — Addendum Note (Signed)
 Addended by: PERRI RONAL PARAS on: 11/20/2024 06:34 PM   Modules accepted: Level of Service

## 2024-11-20 NOTE — Addendum Note (Signed)
 Addended by: PERRI RONAL PARAS on: 11/20/2024 06:31 PM   Modules accepted: Level of Service

## 2024-12-01 ENCOUNTER — Ambulatory Visit: Admitting: Gastroenterology

## 2024-12-15 ENCOUNTER — Inpatient Hospital Stay: Attending: Oncology

## 2024-12-15 ENCOUNTER — Inpatient Hospital Stay: Admitting: Oncology

## 2024-12-15 VITALS — BP 113/60 | HR 92 | Temp 98.3°F | Resp 16 | Wt 123.1 lb

## 2024-12-15 DIAGNOSIS — C188 Malignant neoplasm of overlapping sites of colon: Secondary | ICD-10-CM | POA: Diagnosis not present

## 2024-12-15 LAB — CEA (ACCESS): CEA (CHCC): 10.85 ng/mL — ABNORMAL HIGH (ref 0.00–5.00)

## 2024-12-15 NOTE — Progress Notes (Signed)
 " [facility] OFFICE PROGRESS NOTE   Diagnosis: Rectal cancer  INTERVAL HISTORY:   Jeanne Smith returns as scheduled.  She is followed by Dr. Charlanne for cirrhosis.  She reports an upper endoscopy was canceled which she was admitted in December with pneumonia.  No difficulty with bowel function.  No bleeding.  She has a remote history of hemorrhoid bleeding.  Objective:  Vital signs in last 24 hours:  Blood pressure 113/60, pulse 92, temperature 98.3 F (36.8 C), temperature source Temporal, resp. rate 16, weight 123 lb 1.6 oz (55.8 kg), SpO2 100%.    Lymphatics: No cervical, supraclavicular, right axillary, or inguinal nodes.  Shotty left axillary node. Resp: Lungs clear bilaterally Cardio: Regular rate and rhythm GI: No hepatosplenomegaly, nontender, no mass Vascular: No leg edema  Skin: Telangiectasias at the upper chest and back   Lab Results:  Lab Results  Component Value Date   WBC 9.1 11/14/2024   HGB 12.7 11/14/2024   HCT 37.1 11/14/2024   MCV 99.2 11/14/2024   PLT 135 (L) 11/14/2024   NEUTROABS 6,789 11/14/2024    CMP  Lab Results  Component Value Date   NA 138 11/14/2024   K 3.7 11/14/2024   CL 105 11/14/2024   CO2 24 11/14/2024   GLUCOSE 119 (H) 11/14/2024   BUN 9 11/14/2024   CREATININE 0.98 11/14/2024   CALCIUM  9.2 11/14/2024   PROT 6.6 11/14/2024   ALBUMIN 2.8 (L) 11/08/2024   AST 77 (H) 11/14/2024   ALT 30 (H) 11/14/2024   ALKPHOS 191 (H) 11/08/2024   BILITOT 2.8 (H) 11/14/2024   GFRNONAA 57 (L) 11/08/2024   GFRAA 87 01/17/2021    Lab Results  Component Value Date   CEA 8.70 (H) 06/13/2024    Lab Results  Component Value Date   INR 1.3 (H) 11/08/2023   LABPROT 13.3 (H) 11/08/2023    Imaging:  No results found.  Medications: I have reviewed the patient's current medications.   Assessment/Plan: Rectal cancer Mass at 12-14 cm from the anal verge on colonoscopy 05/03/2022-invasive well to moderately differentiated  adenocarcinoma arising in an adenoma with high-grade dysplasia CTs 05/12/2022-small polypoid lesion and masslike area of thickening in the ascending colon, no evidence of metastatic disease, changes of cirrhosis and hepatic steatosis Colonoscopy 05/25/2022-nonobstructing mass at 12-14 cm, tattoo at the distal end, 6 mm polyp in the proximal transverse colon no right colon mass lesion Pelvic MRI 06/06/2022-no primary rectal tumor visualized, MRI stage T1/T2, N0 Robotic low anterior resection 07/05/2022-moderately differentiated adenocarcinoma of the rectosigmoid junction,pT2pN0, 0/15 nodes, no lymphovascular perineural invasion, negative resection margins, no loss of mismatch repair protein expression 07/08/2024 CTs: No evidence of metastatic disease, chronic calcific pancreatitis, cirrhosis with portal hypertension 11/06/2024 CTs: Small right pleural effusion, heterogenous right lung ground glass disease, bilateral perinephric stranding with left kidney hypoenhancement, cirrhosis, distended gallbladder with mild diffuse gallbladder wall thickening, chronic pancreatitis suggestive of pyelonephritis  Multiple colon polyps noted on colonoscopy 05/03/2022 and 05/25/2022, as well as on the gross surgical specimen 07/05/2022. Tubular adenoma 05/03/2022, sessile serrated adenoma 05/25/2022, histologic description of gross polyps on gross specimen not described histologically on final pathology report Colonoscopy 05/13/2024: Multiple polyps removed-tubular adenomas 3.  Changes of cirrhosis on CT 05/12/2022 4.  Alcohol  use 5.  Hyperlipidemia 6.  Hypertension 7.  Chronic back pain 8.  Hypothyroidism 9.  Renal insufficiency   Disposition: [name] remains in clinical remission from rectal cancer.  She has multiple comorbid conditions including cirrhosis.  The  CEA remains mildly elevated.  This is likely a benign nonspecific finding, potentially related to renal insufficiency, pancreatitis, or cirrhosis.  She has  undergone multiple CTs over the past 6 months without evidence of recurrent cancer.  I will ask her to return for a CEA in 2 months.  If the CEA is again higher I will recommend circulating tumor DNA testing.  She will return for an office and lab visit in 6 months.  She will continue colonoscopy/endoscopy follow-up and management of cirrhosis by Dr. Charlanne.  Arley Hof, MD  12/15/2024  8:54 AM   "

## 2025-01-02 ENCOUNTER — Telehealth: Payer: Self-pay | Admitting: *Deleted

## 2025-01-02 DIAGNOSIS — C188 Malignant neoplasm of overlapping sites of colon: Secondary | ICD-10-CM

## 2025-01-02 NOTE — Telephone Encounter (Signed)
 LVM for Keegan to return call to discuss the continued increase in CEA from January. MD plan is to repeat in March + Guardant Reveal. Orders placed.

## 2025-06-15 ENCOUNTER — Inpatient Hospital Stay

## 2025-06-15 ENCOUNTER — Inpatient Hospital Stay: Admitting: Oncology
